# Patient Record
Sex: Female | Born: 1955 | Race: Black or African American | Hispanic: No | Marital: Single | Smoking: Never smoker
Health system: Southern US, Community
[De-identification: ages and names within clinical notes are randomized; demographics above are authoritative.]

## PROBLEM LIST (undated history)

## (undated) DIAGNOSIS — Z86718 Personal history of other venous thrombosis and embolism: Secondary | ICD-10-CM

## (undated) DIAGNOSIS — J40 Bronchitis, not specified as acute or chronic: Secondary | ICD-10-CM

## (undated) DIAGNOSIS — Q2730 Arteriovenous malformation, site unspecified: Secondary | ICD-10-CM

## (undated) DIAGNOSIS — I739 Peripheral vascular disease, unspecified: Secondary | ICD-10-CM

## (undated) DIAGNOSIS — J9610 Chronic respiratory failure, unspecified whether with hypoxia or hypercapnia: Secondary | ICD-10-CM

## (undated) DIAGNOSIS — I6529 Occlusion and stenosis of unspecified carotid artery: Secondary | ICD-10-CM

## (undated) DIAGNOSIS — N19 Unspecified kidney failure: Secondary | ICD-10-CM

## (undated) DIAGNOSIS — I779 Disorder of arteries and arterioles, unspecified: Secondary | ICD-10-CM

## (undated) DIAGNOSIS — E039 Hypothyroidism, unspecified: Secondary | ICD-10-CM

## (undated) DIAGNOSIS — J449 Chronic obstructive pulmonary disease, unspecified: Secondary | ICD-10-CM

## (undated) DIAGNOSIS — E875 Hyperkalemia: Secondary | ICD-10-CM

## (undated) DIAGNOSIS — I998 Other disorder of circulatory system: Secondary | ICD-10-CM

## (undated) DIAGNOSIS — E079 Disorder of thyroid, unspecified: Secondary | ICD-10-CM

## (undated) DIAGNOSIS — D649 Anemia, unspecified: Secondary | ICD-10-CM

## (undated) DIAGNOSIS — I639 Cerebral infarction, unspecified: Secondary | ICD-10-CM

## (undated) DIAGNOSIS — I10 Essential (primary) hypertension: Secondary | ICD-10-CM

## (undated) HISTORY — PX: FEMORAL ENDARTERECTOMY: SUR606

## (undated) HISTORY — PX: LEG SURGERY: SHX1003

## (undated) HISTORY — DX: Disorder of arteries and arterioles, unspecified: I77.9

## (undated) HISTORY — DX: Hypothyroidism, unspecified: E03.9

## (undated) HISTORY — DX: Peripheral vascular disease, unspecified: I73.9

## (undated) HISTORY — DX: Other disorder of circulatory system: I99.8

## (undated) HISTORY — PX: BREAST CYST EXCISION: SHX579

## (undated) HISTORY — DX: Essential (primary) hypertension: I10

---

## 2005-06-10 ENCOUNTER — Other Ambulatory Visit: Payer: Self-pay

## 2005-06-10 ENCOUNTER — Emergency Department: Payer: Self-pay | Admitting: Emergency Medicine

## 2008-06-12 ENCOUNTER — Emergency Department: Payer: Self-pay | Admitting: Unknown Physician Specialty

## 2009-02-03 ENCOUNTER — Emergency Department: Payer: Self-pay | Admitting: Emergency Medicine

## 2010-08-16 ENCOUNTER — Emergency Department: Payer: Self-pay | Admitting: Emergency Medicine

## 2010-08-27 ENCOUNTER — Emergency Department: Payer: Self-pay | Admitting: Emergency Medicine

## 2012-09-15 ENCOUNTER — Inpatient Hospital Stay: Payer: Self-pay | Admitting: Specialist

## 2012-09-15 LAB — CK TOTAL AND CKMB (NOT AT ARMC)
CK, Total: 45 U/L (ref 21–215)
CK-MB: <0.5 ng/mL — ABNORMAL LOW (ref 0.5–3.6)

## 2012-09-15 LAB — COMPREHENSIVE METABOLIC PANEL
Anion Gap: 4 — ABNORMAL LOW (ref 7–16)
Calcium, Total: 9.1 mg/dL (ref 8.5–10.1)
Co2: 27 mmol/L (ref 21–32)
Creatinine: 1.05 mg/dL (ref 0.60–1.30)
EGFR (African American): >60
EGFR (Non-African Amer.): 59 — ABNORMAL LOW
SGOT(AST): 16 U/L (ref 15–37)
Sodium: 138 mmol/L (ref 136–145)
Total Protein: 7.9 g/dL (ref 6.4–8.2)

## 2012-09-15 LAB — CBC
HCT: 34.4 % — ABNORMAL LOW (ref 35.0–47.0)
HGB: 11 g/dL — ABNORMAL LOW (ref 12.0–16.0)
MCH: 23.5 pg — ABNORMAL LOW (ref 26.0–34.0)
MCHC: 32.1 g/dL (ref 32.0–36.0)
MCV: 73 fL — ABNORMAL LOW (ref 80–100)
Platelet: 345 10*3/uL (ref 150–440)
RBC: 4.7 10*6/uL (ref 3.80–5.20)
RDW: 19.4 % — ABNORMAL HIGH (ref 11.5–14.5)

## 2012-09-16 LAB — CBC WITH DIFFERENTIAL/PLATELET
Basophil %: 1.2 %
Eosinophil #: 0.1 10*3/uL (ref 0.0–0.7)
HGB: 10.3 g/dL — ABNORMAL LOW (ref 12.0–16.0)
Lymphocyte %: 31.7 %
MCV: 72 fL — ABNORMAL LOW (ref 80–100)
Monocyte #: 0.6 x10 3/mm (ref 0.2–0.9)
Neutrophil %: 54.8 %
WBC: 5.7 10*3/uL (ref 3.6–11.0)

## 2012-09-16 LAB — BASIC METABOLIC PANEL
Anion Gap: 4 — ABNORMAL LOW (ref 7–16)
Calcium, Total: 9.4 mg/dL (ref 8.5–10.1)
Co2: 28 mmol/L (ref 21–32)
Creatinine: 1.35 mg/dL — ABNORMAL HIGH (ref 0.60–1.30)
EGFR (Non-African Amer.): 44 — ABNORMAL LOW
Glucose: 91 mg/dL (ref 65–99)
Potassium: 4.5 mmol/L (ref 3.5–5.1)

## 2012-09-16 LAB — LIPID PANEL
HDL Cholesterol: 56 mg/dL (ref 40–60)
Ldl Cholesterol, Calc: 138 mg/dL — ABNORMAL HIGH (ref 0–100)
Triglycerides: 105 mg/dL (ref 0–200)
VLDL Cholesterol, Calc: 21 mg/dL (ref 5–40)

## 2012-09-16 LAB — TSH: Thyroid Stimulating Horm: 1.71 u[IU]/mL

## 2012-09-17 ENCOUNTER — Ambulatory Visit: Payer: Self-pay | Admitting: Neurology

## 2012-09-17 LAB — URINALYSIS, COMPLETE
Hyaline Cast: 2
Ph: 6 (ref 4.5–8.0)
Protein: NEGATIVE
RBC,UR: 20 /HPF (ref 0–5)
Specific Gravity: 1.012 (ref 1.003–1.030)
WBC UR: 248 /HPF (ref 0–5)

## 2012-09-17 LAB — BASIC METABOLIC PANEL
Anion Gap: 8 (ref 7–16)
Chloride: 108 mmol/L — ABNORMAL HIGH (ref 98–107)
Co2: 24 mmol/L (ref 21–32)
Creatinine: 1.93 mg/dL — ABNORMAL HIGH (ref 0.60–1.30)
EGFR (African American): 33 — ABNORMAL LOW
Glucose: 96 mg/dL (ref 65–99)
Osmolality: 282 (ref 275–301)
Potassium: 4.2 mmol/L (ref 3.5–5.1)

## 2012-09-18 LAB — BASIC METABOLIC PANEL
Anion Gap: 5 — ABNORMAL LOW (ref 7–16)
BUN: 14 mg/dL (ref 7–18)
Calcium, Total: 8.9 mg/dL (ref 8.5–10.1)
Chloride: 111 mmol/L — ABNORMAL HIGH (ref 98–107)
Creatinine: 1.18 mg/dL (ref 0.60–1.30)
EGFR (Non-African Amer.): 52 — ABNORMAL LOW
Osmolality: 281 (ref 275–301)
Potassium: 4.1 mmol/L (ref 3.5–5.1)

## 2012-09-19 LAB — URINE CULTURE

## 2013-05-30 ENCOUNTER — Ambulatory Visit: Payer: Self-pay | Admitting: Otolaryngology

## 2013-08-02 DIAGNOSIS — N189 Chronic kidney disease, unspecified: Secondary | ICD-10-CM | POA: Insufficient documentation

## 2013-08-10 ENCOUNTER — Emergency Department: Payer: Self-pay | Admitting: Emergency Medicine

## 2013-08-10 LAB — URINALYSIS, COMPLETE
BILIRUBIN, UR: NEGATIVE
BLOOD: NEGATIVE
Glucose,UR: NEGATIVE mg/dL (ref 0–75)
Ketone: NEGATIVE
Nitrite: NEGATIVE
PROTEIN: NEGATIVE
Ph: 5 (ref 4.5–8.0)
RBC,UR: <1 /HPF (ref 0–5)
SPECIFIC GRAVITY: 1.005 (ref 1.003–1.030)
Squamous Epithelial: <1
WBC UR: 2 /HPF (ref 0–5)

## 2013-08-10 LAB — COMPREHENSIVE METABOLIC PANEL
ALK PHOS: 110 U/L
Albumin: 3.6 g/dL (ref 3.4–5.0)
Anion Gap: 2 — ABNORMAL LOW (ref 7–16)
BUN: 39 mg/dL — AB (ref 7–18)
Bilirubin,Total: 0.2 mg/dL (ref 0.2–1.0)
CALCIUM: 8.9 mg/dL (ref 8.5–10.1)
CHLORIDE: 110 mmol/L — AB (ref 98–107)
CREATININE: 1.76 mg/dL — AB (ref 0.60–1.30)
Co2: 26 mmol/L (ref 21–32)
EGFR (Non-African Amer.): 32 — ABNORMAL LOW
GFR CALC AF AMER: 37 — AB
Glucose: 104 mg/dL — ABNORMAL HIGH (ref 65–99)
Osmolality: 285 (ref 275–301)
POTASSIUM: 4.5 mmol/L (ref 3.5–5.1)
SGOT(AST): 16 U/L (ref 15–37)
SGPT (ALT): 17 U/L (ref 12–78)
Sodium: 138 mmol/L (ref 136–145)
Total Protein: 7.9 g/dL (ref 6.4–8.2)

## 2013-08-10 LAB — CBC
HCT: 37.2 % (ref 35.0–47.0)
HGB: 12 g/dL (ref 12.0–16.0)
MCH: 29.5 pg (ref 26.0–34.0)
MCHC: 32.1 g/dL (ref 32.0–36.0)
MCV: 92 fL (ref 80–100)
PLATELETS: 329 10*3/uL (ref 150–440)
RBC: 4.05 10*6/uL (ref 3.80–5.20)
RDW: 15.3 % — ABNORMAL HIGH (ref 11.5–14.5)
WBC: 4.9 10*3/uL (ref 3.6–11.0)

## 2013-11-18 ENCOUNTER — Emergency Department: Payer: Self-pay | Admitting: Emergency Medicine

## 2013-11-18 LAB — CBC WITH DIFFERENTIAL/PLATELET
BASOS PCT: 1.1 %
Basophil #: 0.1 10*3/uL (ref 0.0–0.1)
EOS PCT: 1.6 %
Eosinophil #: 0.1 10*3/uL (ref 0.0–0.7)
HCT: 36.1 % (ref 35.0–47.0)
HGB: 11.3 g/dL — AB (ref 12.0–16.0)
Lymphocyte #: 2 10*3/uL (ref 1.0–3.6)
Lymphocyte %: 37.9 %
MCH: 28.3 pg (ref 26.0–34.0)
MCHC: 31.3 g/dL — ABNORMAL LOW (ref 32.0–36.0)
MCV: 90 fL (ref 80–100)
MONO ABS: 0.6 x10 3/mm (ref 0.2–0.9)
Monocyte %: 10.9 %
NEUTROS PCT: 48.5 %
Neutrophil #: 2.5 10*3/uL (ref 1.4–6.5)
Platelet: 333 10*3/uL (ref 150–440)
RBC: 3.99 10*6/uL (ref 3.80–5.20)
RDW: 14.9 % — ABNORMAL HIGH (ref 11.5–14.5)
WBC: 5.2 10*3/uL (ref 3.6–11.0)

## 2013-11-18 LAB — BASIC METABOLIC PANEL
Anion Gap: 6 — ABNORMAL LOW (ref 7–16)
BUN: 25 mg/dL — ABNORMAL HIGH (ref 7–18)
CHLORIDE: 109 mmol/L — AB (ref 98–107)
Calcium, Total: 8.9 mg/dL (ref 8.5–10.1)
Co2: 25 mmol/L (ref 21–32)
Creatinine: 1.57 mg/dL — ABNORMAL HIGH (ref 0.60–1.30)
EGFR (African American): 42 — ABNORMAL LOW
GFR CALC NON AF AMER: 36 — AB
GLUCOSE: 100 mg/dL — AB (ref 65–99)
Osmolality: 284 (ref 275–301)
POTASSIUM: 4.7 mmol/L (ref 3.5–5.1)
Sodium: 140 mmol/L (ref 136–145)

## 2013-11-18 LAB — URIC ACID: URIC ACID: 5.9 mg/dL (ref 2.6–6.0)

## 2013-11-23 DIAGNOSIS — K219 Gastro-esophageal reflux disease without esophagitis: Secondary | ICD-10-CM | POA: Insufficient documentation

## 2013-11-23 DIAGNOSIS — Z8673 Personal history of transient ischemic attack (TIA), and cerebral infarction without residual deficits: Secondary | ICD-10-CM | POA: Insufficient documentation

## 2014-01-11 DIAGNOSIS — I779 Disorder of arteries and arterioles, unspecified: Secondary | ICD-10-CM | POA: Insufficient documentation

## 2014-02-03 DIAGNOSIS — E875 Hyperkalemia: Secondary | ICD-10-CM | POA: Insufficient documentation

## 2014-02-25 HISTORY — PX: OTHER SURGICAL HISTORY: SHX169

## 2014-03-15 ENCOUNTER — Inpatient Hospital Stay: Payer: Self-pay | Admitting: Internal Medicine

## 2014-03-15 LAB — DRUG SCREEN, URINE
Amphetamines, Ur Screen: NEGATIVE (ref ?–1000)
Barbiturates, Ur Screen: NEGATIVE (ref ?–200)
Benzodiazepine, Ur Scrn: NEGATIVE (ref ?–200)
Cannabinoid 50 Ng, Ur ~~LOC~~: NEGATIVE (ref ?–50)
Cocaine Metabolite,Ur ~~LOC~~: NEGATIVE (ref ?–300)
MDMA (ECSTASY) UR SCREEN: NEGATIVE (ref ?–500)
Methadone, Ur Screen: NEGATIVE (ref ?–300)
Opiate, Ur Screen: NEGATIVE (ref ?–300)
PHENCYCLIDINE (PCP) UR S: NEGATIVE (ref ?–25)
TRICYCLIC, UR SCREEN: NEGATIVE (ref ?–1000)

## 2014-03-15 LAB — TROPONIN I
Troponin-I: <0.02 ng/mL
Troponin-I: <0.02 ng/mL
Troponin-I: <0.02 ng/mL

## 2014-03-15 LAB — URINALYSIS, COMPLETE
Bilirubin,UR: NEGATIVE
Blood: NEGATIVE
GLUCOSE, UR: NEGATIVE mg/dL (ref 0–75)
Ketone: NEGATIVE
Nitrite: NEGATIVE
PROTEIN: NEGATIVE
Ph: 5 (ref 4.5–8.0)
Specific Gravity: 1.01 (ref 1.003–1.030)

## 2014-03-15 LAB — CBC
HCT: 18.2 % — AB (ref 35.0–47.0)
HGB: 5.9 g/dL — ABNORMAL LOW (ref 12.0–16.0)
MCH: 28.8 pg (ref 26.0–34.0)
MCHC: 32.3 g/dL (ref 32.0–36.0)
MCV: 89 fL (ref 80–100)
Platelet: 458 10*3/uL — ABNORMAL HIGH (ref 150–440)
RBC: 2.04 10*6/uL — AB (ref 3.80–5.20)
RDW: 17 % — AB (ref 11.5–14.5)
WBC: 6.7 10*3/uL (ref 3.6–11.0)

## 2014-03-15 LAB — COMPREHENSIVE METABOLIC PANEL
ALBUMIN: 3 g/dL — AB (ref 3.4–5.0)
ANION GAP: 12 (ref 7–16)
AST: 12 U/L — AB (ref 15–37)
Alkaline Phosphatase: 82 U/L
BUN: 85 mg/dL — ABNORMAL HIGH (ref 7–18)
Bilirubin,Total: 0.2 mg/dL (ref 0.2–1.0)
CALCIUM: 9.2 mg/dL (ref 8.5–10.1)
CO2: 23 mmol/L (ref 21–32)
Chloride: 92 mmol/L — ABNORMAL LOW (ref 98–107)
Creatinine: 7.26 mg/dL — ABNORMAL HIGH (ref 0.60–1.30)
EGFR (African American): 7 — ABNORMAL LOW
GFR CALC NON AF AMER: 6 — AB
GLUCOSE: 99 mg/dL (ref 65–99)
Osmolality: 281 (ref 275–301)
POTASSIUM: 5.6 mmol/L — AB (ref 3.5–5.1)
SGPT (ALT): 10 U/L — ABNORMAL LOW
SODIUM: 127 mmol/L — AB (ref 136–145)
TOTAL PROTEIN: 7.5 g/dL (ref 6.4–8.2)

## 2014-03-15 LAB — BASIC METABOLIC PANEL
Anion Gap: 12 (ref 7–16)
BUN: 78 mg/dL — AB (ref 7–18)
CO2: 20 mmol/L — AB (ref 21–32)
Calcium, Total: 8.5 mg/dL (ref 8.5–10.1)
Chloride: 100 mmol/L (ref 98–107)
Creatinine: 5.35 mg/dL — ABNORMAL HIGH (ref 0.60–1.30)
EGFR (African American): 11 — ABNORMAL LOW
EGFR (Non-African Amer.): 9 — ABNORMAL LOW
GLUCOSE: 112 mg/dL — AB (ref 65–99)
OSMOLALITY: 289 (ref 275–301)
Potassium: 5.5 mmol/L — ABNORMAL HIGH (ref 3.5–5.1)
SODIUM: 132 mmol/L — AB (ref 136–145)

## 2014-03-15 LAB — CK TOTAL AND CKMB (NOT AT ARMC)
CK, Total: 109 U/L
CK, Total: 110 U/L
CK-MB: 1.4 ng/mL (ref 0.5–3.6)
CK-MB: 1.3 ng/mL (ref 0.5–3.6)

## 2014-03-15 LAB — IRON AND TIBC
IRON: 23 ug/dL — AB (ref 50–170)
Iron Bind.Cap.(Total): 263 ug/dL (ref 250–450)
Iron Saturation: 9 %
Unbound Iron-Bind.Cap.: 240 ug/dL

## 2014-03-15 LAB — FERRITIN: Ferritin (ARMC): 98 ng/mL (ref 8–388)

## 2014-03-16 LAB — CK TOTAL AND CKMB (NOT AT ARMC)
CK, Total: 106 U/L
CK-MB: 1.3 ng/mL (ref 0.5–3.6)

## 2014-03-16 LAB — FERRITIN: Ferritin (ARMC): 77 ng/mL (ref 8–388)

## 2014-03-16 LAB — CBC WITH DIFFERENTIAL/PLATELET
BASOS ABS: 0.1 10*3/uL (ref 0.0–0.1)
BASOS PCT: 0.8 %
EOS PCT: 0.7 %
Eosinophil #: 0 10*3/uL (ref 0.0–0.7)
HCT: 20.6 % — ABNORMAL LOW (ref 35.0–47.0)
HGB: 6.8 g/dL — AB (ref 12.0–16.0)
Lymphocyte #: 1.3 10*3/uL (ref 1.0–3.6)
Lymphocyte %: 19.2 %
MCH: 29 pg (ref 26.0–34.0)
MCHC: 32.9 g/dL (ref 32.0–36.0)
MCV: 88 fL (ref 80–100)
MONO ABS: 0.9 x10 3/mm (ref 0.2–0.9)
Monocyte %: 14.6 %
NEUTROS ABS: 4.2 10*3/uL (ref 1.4–6.5)
Neutrophil %: 64.7 %
Platelet: 393 10*3/uL (ref 150–440)
RBC: 2.34 10*6/uL — ABNORMAL LOW (ref 3.80–5.20)
RDW: 16 % — ABNORMAL HIGH (ref 11.5–14.5)
WBC: 6.5 10*3/uL (ref 3.6–11.0)

## 2014-03-16 LAB — BASIC METABOLIC PANEL
Anion Gap: 10 (ref 7–16)
BUN: 75 mg/dL — ABNORMAL HIGH (ref 7–18)
CHLORIDE: 103 mmol/L (ref 98–107)
CO2: 22 mmol/L (ref 21–32)
Calcium, Total: 8.5 mg/dL (ref 8.5–10.1)
Creatinine: 3.93 mg/dL — ABNORMAL HIGH (ref 0.60–1.30)
EGFR (Non-African Amer.): 13 — ABNORMAL LOW
GFR CALC AF AMER: 15 — AB
GLUCOSE: 86 mg/dL (ref 65–99)
Osmolality: 292 (ref 275–301)
Potassium: 5 mmol/L (ref 3.5–5.1)
Sodium: 135 mmol/L — ABNORMAL LOW (ref 136–145)

## 2014-03-16 LAB — TROPONIN I: Troponin-I: 0.02 ng/mL

## 2014-03-16 LAB — IRON AND TIBC
IRON: 24 ug/dL — AB (ref 50–170)
Iron Bind.Cap.(Total): 227 ug/dL — ABNORMAL LOW (ref 250–450)
Iron Saturation: 11 %
UNBOUND IRON-BIND. CAP.: 203 ug/dL

## 2014-03-17 LAB — BASIC METABOLIC PANEL WITH GFR
Anion Gap: 7
BUN: 41 mg/dL — ABNORMAL HIGH
Calcium, Total: 7.9 mg/dL — ABNORMAL LOW
Chloride: 115 mmol/L — ABNORMAL HIGH
Co2: 22 mmol/L
Creatinine: 1.64 mg/dL — ABNORMAL HIGH
EGFR (African American): 42 — ABNORMAL LOW
EGFR (Non-African Amer.): 34 — ABNORMAL LOW
Glucose: 102 mg/dL — ABNORMAL HIGH
Osmolality: 297
Potassium: 5 mmol/L
Sodium: 144 mmol/L

## 2014-03-17 LAB — CBC WITH DIFFERENTIAL/PLATELET
Basophil #: 0.1 x10 3/mm 3
Basophil %: 1.2 %
Eosinophil #: 0 x10 3/mm 3
Eosinophil %: 0.5 %
HCT: 19.3 % — ABNORMAL LOW
HGB: 6.3 g/dL — ABNORMAL LOW
Lymphocyte %: 18.3 %
Lymphs Abs: 1.2 x10 3/mm 3
MCH: 29 pg
MCHC: 32.6 g/dL
MCV: 89 fL
Monocyte #: 0.9 "x10 3/mm "
Monocyte %: 13.7 %
Neutrophil #: 4.4 x10 3/mm 3
Neutrophil %: 66.3 %
Platelet: 377 x10 3/mm 3
RBC: 2.17 X10 6/mm 3 — ABNORMAL LOW
RDW: 16.2 % — ABNORMAL HIGH
WBC: 6.7 x10 3/mm 3

## 2014-03-17 LAB — PROTEIN / CREATININE RATIO, URINE
Creatinine, Urine: 50 mg/dL
Protein, Random Urine: 9 mg/dL
Protein/Creat. Ratio: 180 mg/g{creat}

## 2014-03-18 LAB — CBC WITH DIFFERENTIAL/PLATELET
Basophil #: 0.1 10*3/uL (ref 0.0–0.1)
Basophil %: 0.8 %
EOS ABS: 0.1 10*3/uL (ref 0.0–0.7)
EOS PCT: 1 %
HCT: 24.1 % — AB (ref 35.0–47.0)
HGB: 7.8 g/dL — AB (ref 12.0–16.0)
LYMPHS ABS: 1.7 10*3/uL (ref 1.0–3.6)
Lymphocyte %: 24.3 %
MCH: 28.6 pg (ref 26.0–34.0)
MCHC: 32.3 g/dL (ref 32.0–36.0)
MCV: 89 fL (ref 80–100)
MONO ABS: 1 x10 3/mm — AB (ref 0.2–0.9)
Monocyte %: 14.2 %
NEUTROS PCT: 59.7 %
Neutrophil #: 4.2 10*3/uL (ref 1.4–6.5)
Platelet: 385 10*3/uL (ref 150–440)
RBC: 2.72 10*6/uL — ABNORMAL LOW (ref 3.80–5.20)
RDW: 17 % — AB (ref 11.5–14.5)
WBC: 7.1 10*3/uL (ref 3.6–11.0)

## 2014-03-18 LAB — BASIC METABOLIC PANEL
Anion Gap: 9 (ref 7–16)
BUN: 29 mg/dL — AB (ref 7–18)
CALCIUM: 8.5 mg/dL (ref 8.5–10.1)
CO2: 21 mmol/L (ref 21–32)
Chloride: 115 mmol/L — ABNORMAL HIGH (ref 98–107)
Creatinine: 1.39 mg/dL — ABNORMAL HIGH (ref 0.60–1.30)
EGFR (Non-African Amer.): 42 — ABNORMAL LOW
GFR CALC AF AMER: 50 — AB
GLUCOSE: 96 mg/dL (ref 65–99)
Osmolality: 294 (ref 275–301)
Potassium: 4.8 mmol/L (ref 3.5–5.1)
SODIUM: 145 mmol/L (ref 136–145)

## 2014-03-19 LAB — CBC WITH DIFFERENTIAL/PLATELET
Basophil #: 0.1 10*3/uL (ref 0.0–0.1)
Basophil %: 1 %
EOS PCT: 1.5 %
Eosinophil #: 0.1 10*3/uL (ref 0.0–0.7)
HCT: 23.1 % — AB (ref 35.0–47.0)
HGB: 7.4 g/dL — ABNORMAL LOW (ref 12.0–16.0)
LYMPHS PCT: 21.6 %
Lymphocyte #: 1.4 10*3/uL (ref 1.0–3.6)
MCH: 28.8 pg (ref 26.0–34.0)
MCHC: 32.2 g/dL (ref 32.0–36.0)
MCV: 90 fL (ref 80–100)
Monocyte #: 0.8 x10 3/mm (ref 0.2–0.9)
Monocyte %: 11.7 %
NEUTROS ABS: 4.3 10*3/uL (ref 1.4–6.5)
Neutrophil %: 64.2 %
Platelet: 386 10*3/uL (ref 150–440)
RBC: 2.58 10*6/uL — AB (ref 3.80–5.20)
RDW: 16.8 % — ABNORMAL HIGH (ref 11.5–14.5)
WBC: 6.7 10*3/uL (ref 3.6–11.0)

## 2014-03-19 LAB — BASIC METABOLIC PANEL
Anion Gap: 9 (ref 7–16)
BUN: 22 mg/dL — AB (ref 7–18)
CO2: 20 mmol/L — AB (ref 21–32)
Calcium, Total: 8.3 mg/dL — ABNORMAL LOW (ref 8.5–10.1)
Chloride: 116 mmol/L — ABNORMAL HIGH (ref 98–107)
Creatinine: 1.3 mg/dL (ref 0.60–1.30)
EGFR (African American): 54 — ABNORMAL LOW
EGFR (Non-African Amer.): 45 — ABNORMAL LOW
Glucose: 95 mg/dL (ref 65–99)
Osmolality: 292 (ref 275–301)
Potassium: 4.3 mmol/L (ref 3.5–5.1)
Sodium: 145 mmol/L (ref 136–145)

## 2014-03-19 LAB — UR PROT ELECTROPHORESIS, URINE RANDOM

## 2014-03-19 LAB — PROTEIN ELECTROPHORESIS(ARMC)

## 2014-03-20 LAB — BASIC METABOLIC PANEL
Anion Gap: 9 (ref 7–16)
BUN: 15 mg/dL (ref 7–18)
CHLORIDE: 111 mmol/L — AB (ref 98–107)
Calcium, Total: 8.3 mg/dL — ABNORMAL LOW (ref 8.5–10.1)
Co2: 22 mmol/L (ref 21–32)
Creatinine: 1.26 mg/dL (ref 0.60–1.30)
EGFR (Non-African Amer.): 47 — ABNORMAL LOW
GFR CALC AF AMER: 56 — AB
Glucose: 96 mg/dL (ref 65–99)
Osmolality: 284 (ref 275–301)
Potassium: 4.1 mmol/L (ref 3.5–5.1)
Sodium: 142 mmol/L (ref 136–145)

## 2014-03-20 LAB — HEMOGLOBIN: HGB: 7.4 g/dL — AB (ref 12.0–16.0)

## 2014-03-21 LAB — HEMOGLOBIN: HGB: 7.2 g/dL — ABNORMAL LOW (ref 12.0–16.0)

## 2014-03-22 LAB — HEMOGLOBIN: HGB: 6.9 g/dL — ABNORMAL LOW (ref 12.0–16.0)

## 2014-03-22 LAB — OCCULT BLOOD X 1 CARD TO LAB, STOOL: Occult Blood, Feces: POSITIVE

## 2014-03-23 LAB — HEMOGLOBIN: HGB: 7.8 g/dL — ABNORMAL LOW (ref 12.0–16.0)

## 2014-04-27 ENCOUNTER — Emergency Department: Payer: Self-pay | Admitting: Emergency Medicine

## 2014-04-27 LAB — CBC
HCT: 46.7 % (ref 35.0–47.0)
HGB: 14.7 g/dL (ref 12.0–16.0)
MCH: 27.5 pg (ref 26.0–34.0)
MCHC: 31.5 g/dL — ABNORMAL LOW (ref 32.0–36.0)
MCV: 88 fL (ref 80–100)
PLATELETS: 419 10*3/uL (ref 150–440)
RBC: 5.34 10*6/uL — ABNORMAL HIGH (ref 3.80–5.20)
RDW: 18.1 % — ABNORMAL HIGH (ref 11.5–14.5)
WBC: 10.3 10*3/uL (ref 3.6–11.0)

## 2014-04-27 LAB — COMPREHENSIVE METABOLIC PANEL
ALK PHOS: 93 U/L
ANION GAP: 10 (ref 7–16)
Albumin: 3.7 g/dL (ref 3.4–5.0)
BILIRUBIN TOTAL: 0.6 mg/dL (ref 0.2–1.0)
BUN: 7 mg/dL (ref 7–18)
CREATININE: 1 mg/dL (ref 0.60–1.30)
Calcium, Total: 8.8 mg/dL (ref 8.5–10.1)
Chloride: 104 mmol/L (ref 98–107)
Co2: 26 mmol/L (ref 21–32)
EGFR (African American): >60
EGFR (Non-African Amer.): >60
Glucose: 111 mg/dL — ABNORMAL HIGH (ref 65–99)
OSMOLALITY: 278 (ref 275–301)
POTASSIUM: 3 mmol/L — AB (ref 3.5–5.1)
SGOT(AST): 42 U/L — ABNORMAL HIGH (ref 15–37)
SGPT (ALT): 50 U/L
Sodium: 140 mmol/L (ref 136–145)
Total Protein: 8.4 g/dL — ABNORMAL HIGH (ref 6.4–8.2)

## 2014-04-27 LAB — LIPASE, BLOOD: LIPASE: 158 U/L (ref 73–393)

## 2014-05-29 ENCOUNTER — Ambulatory Visit: Payer: Self-pay | Admitting: Otolaryngology

## 2014-07-10 ENCOUNTER — Ambulatory Visit: Payer: Self-pay | Admitting: Gastroenterology

## 2014-07-18 DIAGNOSIS — I70229 Atherosclerosis of native arteries of extremities with rest pain, unspecified extremity: Secondary | ICD-10-CM | POA: Insufficient documentation

## 2014-07-18 DIAGNOSIS — I998 Other disorder of circulatory system: Secondary | ICD-10-CM | POA: Insufficient documentation

## 2014-07-18 HISTORY — DX: Atherosclerosis of native arteries of extremities with rest pain, unspecified extremity: I70.229

## 2014-08-17 NOTE — H&P (Signed)
PATIENT NAME:  Kimberly Mccarthy, Kimberly Mccarthy MR#:  [id number] DATE OF BIRTH:  [birth year]  DATE OF ADMISSION:  09/15/2012  PRIMARY CARE PHYSICIAN: None.   CHIEF COMPLAINT: Slurred speech and facial droop.  HISTORY OF PRESENT ILLNESS: The patient is a 59 year old female who presents with the above complaint. Apparently the patient was sitting with her sister this afternoon when her sister noted that she had slurred speech. She was unable to comprehend what the patient was saying. She also noticed facial droop. This was sometime this afternoon.  The patient finally agreed to coming to the hospital. She was brought into the ER where her blood pressure apparently when EMS picked her up was over 280. Her blood pressure here is recorded as 213/99. She continues to have some slurred speech and facial droop.   REVIEW OF SYSTEMS:  CONSTITUTIONAL: No fever. Positive fatigue. No weakness, weight loss or weight gain.  EYES: No blurred vision. No glaucoma or cataract.  ENT: No ear pain, hearing loss, seasonal allergies, postnasal drip, snoring.  RESPIRATORY: No cough, wheezing, hemoptysis, COPD.  CARDIOVASCULAR: No chest pain, orthopnea, palpitations, syncope, edema, arrhythmia, dyspnea on exertion.  GASTROINTESTINAL:  No nausea, vomiting, diarrhea, abdominal pain, melena or ulcers.  GENITOURINARY: No dysuria or hematuria.  ENDOCRINE: No polyuria or polydipsia. HEMATOLOGIC/LYMPHATIC: No anemia, easy bruising or bleeding.  SKIN: No rash or lesions.  MUSCULOSKELETAL: No pain in shoulders, knees. No limited activity.  NEUROLOGICAL: No history of CVA, TIA, or seizures.  PSYCHIATRIC:  No history of anxiety or depression.   PAST MEDICAL HISTORY:  Hypertension.   MEDICATIONS: None.   ALLERGIES: No known drug allergies.  PAST SURGICAL HISTORY: None.   SOCIAL HISTORY: The patient smokes about a pack a day, very occasional alcohol use.   FAMILY HISTORY: Positive for hypertension, diabetes and CAD.   PHYSICAL  EXAMINATION:   VITAL SIGNS: Temperature 98.7, pulse 75, respirations 18, blood pressure 213/99, 99% on room air.  GENERAL: The patient is alert, oriented x 3, does not appear to be in any acute distress.  HEENT: Head is atraumatic. Pupils are round and reactive. Sclerae are anicteric. Mucous membranes are moist. Oropharynx is clear.  NECK: Supple without JVD, carotid bruit or enlarged thyroid.  CARDIOVASCULAR: Regular rate and rhythm. There is a 3/6 systolic ejection murmur heard best at the right sternal border. PMI is laterally displaced.  LUNGS: Clear to auscultation without crackles, rales, rhonchi or wheezing. Normal percussion.  ABDOMEN: Obese.  Bowel sounds are positive. Nontender and nondistended. No hepatosplenomegaly.  EXTREMITIES:  No cyanosis, clubbing or edema.   NEUROLOGICAL: The patient has a left facial droop. She has slurred speech.  All other cranial nerves are intact. Left grip strength is 4 out of 5, left lower extremity is 5 out of 5 strength.  SKIN: Without rash or lesions.   LABORATORY AND RADIOLOGICAL DATA:  White blood cells 7.1, hemoglobin 11, hematocrit 34.4, platelets are 345.  Sodium138, potassium 4.3, chloride 107, bicarbonate 27, BUN 11, creatinine 1.05, glucose 91, calcium 9.1, bilirubin 0.3, alk phos 86, ALT 12, AST 16, total protein 7.9, albumin 3.7. Troponin less than 0.02. CK 45. CPK-MB less than 0.5.  CT of the head shows no acute intracranial hemorrhage or CVA.  EKG shows normal sinus rhythm. She has LVH with some mild LV strain.   ASSESSMENT AND PLAN: The patient is a 6- year-old female who presents with probable cerebrovascular accident with slurred speech and left facial droop as well as hypertensive emergency.   1.  Cerebrovascular accident:  The patient still has slurred speech and left facial droop.  I have ordered an MRI, carotid Dopplers, echocardiogram, speech consultation, and physical therapy consultation. She will be placed on aspirin and statin,  and we can check her lipids in the a.m.  2.  Hypertensive emergency: Probably from her stroke or vice versa, hypertension uncontrolled causing a stroke. We will allow permissive hypertension. I have started low dose of beta blocker, hydralazine p.r.n. and allowed blood pressure to be less than 409 systolic, diastolic less than 811.  3.  Tobacco dependence:  The patient does not want a nicotine patch. She is not interested in quitting, however, either.  The patient was counseled for 3 minutes.  4.  The patient will need a primary care physician prior to discharge   TIME SPENT:  Approximately 45 minutes.  ____________________________ Donell Beers. Benjie Karvonen, MD spm:cb D: 09/15/2012 21:38:14 ET T: 09/15/2012 21:54:45 ET JOB#: [id number]  cc: Keondre Markson P. Benjie Karvonen, MD, <Dictator> Rakesh Dutko P Bosten Newstrom MD ELECTRONICALLY SIGNED 09/19/2012 21:07

## 2014-08-17 NOTE — Discharge Summary (Signed)
PATIENT NAME:  Kimberly Mccarthy, Kimberly Mccarthy MR#:  [id number] DATE OF BIRTH:  23-May-1955  DATE OF ADMISSION:  09/15/2012 DATE OF DISCHARGE:  09/18/2012  For a detailed note, please see the history and physical done on admission by Dr. Adrian Saran.    DIAGNOSES AT DISCHARGE:  1.  Acute pontine cerebrovascular accident.  2.  Slurred speech and left upper extremity weakness secondary to acute cerebrovascular accident.  3.  Malignant hypertension.  4.  Acute renal failure.  5.  Tobacco abuse.  6.  Urinary tract infection.   DIET:  The patient is being discharged on a low sodium, low fat diet.   ACTIVITY: As tolerated.   FOLLOW-UP: With the [facility] in the next 2 to 3 weeks.   DISCHARGE MEDICATIONS:  Are as follows:  Clonidine 0.1 mg b.i.d., aspirin 81 mg daily, hydralazine 25 mg t.i.d., simvastatin 10 mg at bedtime and ciprofloxacin and 50 mg b.i.d. x 5 days.   CONSULTANTS DURING THE HOSPITAL COURSE: Dr. Loretha Brasil from neurology.   PERTINENT STUDIES DONE DURING THE HOSPITAL COURSE: Is as follows:  CT scan of the head done without contrast on admission showing no acute intracranial abnormality. An MRI of the brain done without contrast showing an acute right pons infarct, white matter changes consistent with chronic white matter ischemia.   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   HOSPITAL COURSE: This is a 59 year old female with medical problems as mentioned above, who presented to the hospital on 09/15/2012 secondary to malignant hypertension,  left upper extremity weakness and slurred speech and left-sided facial droop.  1.  Acute cerebrovascular accident. The patient presented to the hospital with significantly elevated blood pressures and slurred speech, left-sided facial droop and left upper extremity weakness. This was likely related to an acute stroke, as was confirmed on  MRI findings in the hospital. She had an acute  right pontine cerebrovascular accident. The patient was started on baby aspirin, on statin. Her blood pressures were controlled. She received physical therapy and occupational therapy and speech in the hospital. She is currently tolerating oral well. She was referred for home health physical therapy and occupational therapy services, although she cannot afford it, as she has no insurance. For now, she is going to be discharged on a baby aspirin, statin and antihypertensives and strongly advised to quit smoking and drinking and be compliant with her medications as mentioned.  Her weakness and her slurred speech has significantly improved since admission.  2. Malignant hypertension. The patient has a history of hypertension and was not taking any meds prior to coming in. She was started on some oral labetalol in the hospital along with some ACE inhibitor also placed on some as needed hydralazine. Her hemodynamics have significantly improved since admission. I am presently discharging her on some generic meds including clonidine and hydralazine with close follow-up with the [facility] as an outpatient.  3.  Tobacco abuse. The patient was strongly advised to quit smoking. She was maintained on a nicotine patch while in the hospital.  4.  Acute renal failure. The patient's creatinine on admission was 1.0; it went up to as high as 1.9, shortly after initiation of ACE inhibitor therapy. I therefore discontinued her ACE inhibitor therapy, hydrate her with IV fluids. Her creatinine has since then has come down and improved. This should further be followed up as an  outpatient, as she may have she may be a high risk for hypertensive renal disease, given her noncompliance.  5.  Urinary tract infection. The patient was noted to have an abnormal urinalysis. She is being discharged on oral ciprofloxacin.  6.  Hyperlipidemia. The patient was started on a statin, given acute  cerebrovascular accident. She is being discharged on simvastatin as mentioned.   CODE STATUS: The patient is a full code.   TIME SPENT ON DISCHARGE: 40 minutes   ____________________________ Rolly PancakeVivek J. Cherlynn KaiserSainani, MD vjs:cc D: 09/18/2012 14:52:28 ET T: 09/18/2012 21:27:41 ET JOB#: [id number]  cc: Rolly PancakeVivek J. Cherlynn KaiserSainani, MD, <Dictator> [facility] [place] SirenVIVEK J SAINANI MD ELECTRONICALLY SIGNED 10/02/2012 20:26

## 2014-08-17 NOTE — Consult Note (Signed)
PATIENT NAME:  Kimberly Mccarthy, Kimberly Mccarthy MR#:  [id number] DATE OF BIRTH:  Feb 25, 1956  DATE OF CONSULTATION:  09/15/2012  REFERRING PHYSICIAN:   CONSULTING PHYSICIAN:  Pauletta Browns, MD  REASON FOR NEUROLOGIC CONSULTATION:  Left facial droop and slurred speech  HISTORY OF PRESENT ILLNESS:  The patient is a 59 year old female with past medical history of hypertension, presenting with a one day history of slurred speech and left facial droop, first noted last afternoon when the patient was sitting with her sister conversing and then suddenly her sister had difficulty understanding her.  It was noted that the patient had left facial droop. The patient was not a TPA candidate since she came to Emergency Department outside of the TPA window, as she had to be convinced to come to the ED by her sister. EMS reported her blood pressure to be 280.  Her blood pressure in the 80 was 213/99. She has a chronic history of hypertension, does not appear to be compliant with her medications and the reason for that is the patient states that she could not afford it. The patient is status post MRI of the brain that showed acute ischemic infarct, right pons.   REVIEW OF SYSTEMS:  CONSTITUTIONAL:  No fevers, positive fatigue.  EYES: No blurred vision.  ENT: No ear pain. No hearing loss.  RESPIRATORY: No cough, no wheezing, no hemoptysis.  CARDIOVASCULAR: No chest pain, orthopnea.  GASTROINTESTINAL: No nausea. No vomiting, no diarrhea.  GENITOURINARY: No dysuria, dysuria, hematuria.  MUSCULOSKELETAL: No pain in the shoulders. PSYCHIATRIC No history of anxiety or depression.   PAST MEDICAL HISTORY:  Hypertension.   MEDICATIONS: Currently not taking any.  ALLERGIES:  No known drug allergies.   PAST SURGICAL HISTORY: None.   SOCIAL HISTORY: The patient smokes half to a pack per day for the past 40 years. She  drinks alcohol daily.  At time she states if she can afford it, she can drink a 24 pack in 2 to 3 days.    FAMILY HISTORY: Positive for hypertension, diabetes, coronary artery disease.    PHYSICAL EXAMINATION: VITAL SIGNS: Temperature 98.7, pulse 61, respirations 20, blood pressure 180/81. GENERAL:  The patient is alert, oriented to place, time, location and able to tell me the president of the [place].  CARDIOVASCULAR: Rhythm, S1, S2 no murmurs. EXTREMITIES:  No cyanosis no clubbing.   NEUROLOGIC EXAMINATION:  Extraocular movements appear to be intact in terms of mentation, attention, concentration intact. The patient was able tell me how many quarters are in $1.75.  The rest of the cranial nerve examination: Extraocular movements are intact. Pupils 3 mm to 2 mm, reactive bilaterally. Mild left facial droop present. Tongue deviates to the left. Uvula is midline. Motor strength examination, there are significant drift the left upper extremity.  There is 4/5 diminished grip and diminished fine motor movements of left upper extremity. The left lower extremity is also 4/5, the rest is 5/5 and this is acute as per patient.   RADIOLOGY DATA: MRI of the brain as above. Right pontine infarct. She is status post carotid Doppler: 50% to 75% bilaterally. No hemodynamic significant stenosis.  Echo with bubble study no signs of PFO.   ASSESSMENT: A 59 year old female with hypertension, chronic smoker, alcohol abuse who presents with right pontine infarct in the setting of uncontrolled hypertension.   PLAN: Status post work-up, including MRI, carotid echocardiogram, physical therapy on board. The patient will require physical therapy with home care.  At this point, I believe  it would be difficult to obtain as the patient is not insured and has difficulty for medications. It is important for her to have a blood pressure control as I spoke to the patient and discussed it.  Also smoking cessation. The patient is a 40 pack-year smoker.  Alcohol cessation. The patient states she drinks up to a 24 pack in 2 to 3  days. Please monitor the patient for DTs as the patient is here for a day and can have alcohol withdrawal.  Blood pressure control with current antihypertensives. Consider possible social work evaluation as the patient is uninsured and will need assistance with paying for her medications. Continue aspirin 325 and statin when the patient is discharged home. At this point, I do not think there is any need for MRA as carotid Dopplers were 50% to 75%. The patient needs a primary care physician upon discharge. Thank you. It was a pleasure seeing this patient.     ____________________________ Pauletta BrownsYuriy Jonnie Truxillo, MD yz:ct D: 09/17/2012 11:46:06 ET T: 09/17/2012 13:08:46 ET JOB#: [id number]  cc: Pauletta BrownsYuriy Salam Chesterfield, MD, <Dictator> Pauletta BrownsYURIY Antia Rahal MD ELECTRONICALLY SIGNED 09/23/2012 14:38

## 2014-08-17 NOTE — Consult Note (Signed)
PATIENT NAME:  Kimberly Mccarthy, Kimberly Mccarthy MR#:  [id number] DATE OF BIRTH:  25-Nov-1955  DATE OF CONSULTATION:  09/17/2012  REFERRING PHYSICIAN:   CONSULTING PHYSICIAN:  Pauletta Browns, MD  REASON FOR NEUROLOGIC CONSULTATION:  Left facial droop and slurred speech  HISTORY OF PRESENT ILLNESS:  The patient is a 59 year old female with past medical history of hypertension, presenting with a one day history of slurred speech and left facial droop, first noted last afternoon when the patient was sitting with her sister conversing and then suddenly her sister had difficulty understanding her.  It was noted that the patient had left facial droop. The patient was not a TPA candidate since she came to Emergency Department outside of the TPA window, as she had to be convinced to come to the ED by her sister. EMS reported her blood pressure to be 280.  Her blood pressure in the 80 was 213/99. She has a chronic history of hypertension, does not appear to be compliant with her medications and the reason for that is the patient states that she could not afford it. The patient is status post MRI of the brain that showed acute ischemic infarct, right pons.   REVIEW OF SYSTEMS:  CONSTITUTIONAL:  No fevers, positive fatigue.  EYES: No blurred vision.  ENT: No ear pain. No hearing loss.  RESPIRATORY: No cough, no wheezing, no hemoptysis.  CARDIOVASCULAR: No chest pain, orthopnea.  GASTROINTESTINAL: No nausea. No vomiting, no diarrhea.  GENITOURINARY: No dysuria, dysuria, hematuria.  MUSCULOSKELETAL: No pain in the shoulders. PSYCHIATRIC No history of anxiety or depression.   PAST MEDICAL HISTORY:  Hypertension.   MEDICATIONS: Currently not taking any.  ALLERGIES:  No known drug allergies.   PAST SURGICAL HISTORY: None.   SOCIAL HISTORY: The patient smokes half to a pack per day for the past 40 years. She  drinks alcohol daily.  At time she states if she can afford it, she can drink a 24 pack in 2 to 3 days.    FAMILY HISTORY: Positive for hypertension, diabetes, coronary artery disease.    PHYSICAL EXAMINATION: VITAL SIGNS: Temperature 98.7, pulse 61, respirations 20, blood pressure 180/81. GENERAL:  The patient is alert, oriented to place, time, location and able to tell me the president of the [place].  CARDIOVASCULAR: Rhythm, S1, S2 no murmurs. EXTREMITIES:  No cyanosis no clubbing.   NEUROLOGIC EXAMINATION:  Extraocular movements appear to be intact in terms of mentation, attention, concentration intact. The patient was able tell me how many quarters are in $1.75.  The rest of the cranial nerve examination: Extraocular movements are intact. Pupils 3 mm to 2 mm, reactive bilaterally. Mild left facial droop present. Tongue deviates to the left. Uvula is midline. Motor strength examination, there are significant drift the left upper extremity.  There is 4/5 diminished grip and diminished fine motor movements of left upper extremity. The left lower extremity is also 4/5, the rest is 5/5 and this is acute as per patient.   RADIOLOGY DATA: MRI of the brain as above. Right pontine infarct. She is status post carotid Doppler: 50% to 75% bilaterally. No hemodynamic significant stenosis.  Echo with bubble study no signs of PFO.   ASSESSMENT: A 59 year old female with hypertension, chronic smoker, alcohol abuse who presents with right pontine infarct in the setting of uncontrolled hypertension.   PLAN: Status post work-up, including MRI, carotid echocardiogram, physical therapy on board. The patient will require physical therapy with home care.  At this point, I believe  it would be difficult to obtain as the patient is not insured and has difficulty for medications. It is important for her to have a blood pressure control as I spoke to the patient and discussed it.  Also smoking cessation. The patient is a 40 pack-year smoker.  Alcohol cessation. The patient states she drinks up to a 24 pack in 2 to 3  days. Please monitor the patient for DTs as the patient is here for a day and can have alcohol withdrawal.  Blood pressure control with current antihypertensives. Consider possible social work evaluation as the patient is uninsured and will need assistance with paying for her medications. Continue aspirin 325 and statin when the patient is discharged home. At this point, I do not think there is any need for MRA as carotid Dopplers were 50% to 75%. The patient needs a primary care physician upon discharge. Thank you. It was a pleasure seeing this patient.    ____________________________ Pauletta BrownsYuriy Marijane Trower, MD yz:ct D: 09/17/2012 11:46:00 ET T: 09/17/2012 13:08:46 ET JOB#: [id number]  cc: Pauletta BrownsYuriy Eldora Napp, MD, <Dictator> Pauletta BrownsYURIY Chaska Hagger MD ELECTRONICALLY SIGNED 10/29/2012 11:40

## 2014-08-17 NOTE — Consult Note (Signed)
PATIENT NAME:  Kimberly Mccarthy, Kimberly Mccarthy MR#:  [id number] DATE OF BIRTH:  1956/02/21  FOLLOWUP DICTATION  DATE OF CONSULTATION:  09/18/2012  CONSULTING PHYSICIAN:  Pauletta BrownsYuriy Jaliana Medellin, MD  This is a 59 year old female with a past medical history of hypertension, presenting with slurred speech and a left facial droop. The patient was found to have a right pontine infarct. She was not on aspirin at home. Was not taking any medications.    Current physical examination has improved since yesterday. Her speech is close to baseline, and improvement in the left upper and left lower extremity weakness.   PHYSICAL EXAMINATION: Extraocular movements appear to be intact. Mentation intact. Concentration intact. Pupils 3 mm to 2 mm, reactive bilaterally. Left facial droop has subsided. Tongue appears to be midline now, and elevates symmetrically. Motor strength appears to be  5-/5, left upper extremity; 4/5 left lower extremity. The rest is 5/5. Coordination is intact. Gait not assessed.   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   ASSESSMENT: A 59 year old female with a past medical history of hypertension, chronic smoker, alcohol abuse, presenting with right pontine infarct. Symptoms have improved.   PLAN: The patient is to be discharged home today, aspirin 325, statin daily. The patient is to follow up with neurology as outpatient. The patient has chronic hypertension, not compliant with medication. It was expressed to her the importance of being compliant with her medication.   Smoking cessation and diet control. The patient will need home PT.   Thank you. It was a pleasure seeing this patient.     ___________________________ Pauletta BrownsYuriy Annisha Baar, MD yz:dm D: 09/18/2012 11:47:04 ET T: 09/18/2012 12:17:15 ET JOB#: [id number]  cc: Pauletta BrownsYuriy Jelesa Mangini, MD, <Dictator> Pauletta BrownsYURIY Abbagail Scaff MD ELECTRONICALLY SIGNED 09/23/2012 14:38

## 2014-08-18 NOTE — Discharge Summary (Signed)
PATIENT NAME:  Kimberly Mccarthy, Kimberly Mccarthy MR#:  [id number] DATE OF BIRTH:  1955-07-04  DATE OF ADMISSION:  03/15/2014 DATE OF DISCHARGE:  03/23/2014  DISCHARGE DIAGNOSES: 1.  Gastritis.  2.  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  3.  Iron deficiency anemia.  4.  Anemia of chronic disease.  5.  Acute renal failure.  6.  Mild acute blood loss anemia.  7.  Hyperkalemia.  8.  Acute encephalopathy.  9.  Left lower extremity wound status post vascular surgery.  10.  Vocal cord polyp. ENT outpatient follow-up.  11.  Hypertension.   CONSULTANTS: Dr. Marva Panda with gastroenterology and Dr. Thedore Mins with nephrology.   IMAGING STUDIES: Include a chest x-ray, portable, that showed right lower lobe atelectasis.   Ultrasound of kidneys bilateral showed no hydronephrosis.   ADMITTING HISTORY AND PHYSICAL: Please see detailed H and P dictated previously. In brief, a 59 year old patient who presented to the hospital with altered mental status, was found to have acute renal failure and dehydrated.   HOSPITAL COURSE: 1.  Acute renal failure, severe dehydration. The patient was taken off all nephrotoxic medications. Was aggressively fluid resuscitated with which her kidney function slowly improved back to normal range. By the day of discharge, the patient has normal urine output, normal creatinine levels. Her acute encephalopathy, which was secondary to acute renal failure, has resolved. Also her acute encephalopathy was thought to be secondary to narcotic medications, which have been reduced in dose.  2.  Gastritis and duodenal AVMs with GI bleed. The patient had an endoscopy done, which showed gastritis. She also had some duodenal AVMs, but they were deep and were not bleeding. She has been started on PPIs and sucralfate, was followed by Dr. Marva Panda during the hospital stay and has been started on iron pills at discharge. Hemoglobin stable by day of discharge.  3.  Recent vascular surgery. The  patient was seen by wound care nurse and home health with wound care has been set up at time of discharge.  4.  The patient was continued on her dialysis for end-stage renal disease during the hospital stay.  5.  There was an incidental finding of a vocal cord polyp on her endoscopy for which she has been referred to ENT.   DISCHARGE PHYSICAL EXAMINATION: Prior to discharge, the patient's lungs sound clear. Heart sounds are S1, S2. No abdominal tenderness on examination. Bowel sounds present.   DISCHARGE MEDICATIONS: 1.  Levothyroxine 50 mcg daily.  2.  Multivitamin 1 tablet daily.  3.  Nystatin topical to effected area twice a day.  4.  Amlodipine 10 mg daily.  5.  Coreg 25 mg daily.  6.  Clonidine 0.3 mg transdermal patch once a week.  7.  Ferrous sulfate 325 mg 2 times a day.  8.  Pravastatin 10 mg daily.  9.  Gabapentin 300 mg oral 2 times a day.  10.  Protonix 40 mg oral 2 times a day.  11.  Acetaminophen/hydrocodone 325/50 one tablet every 6 hours as needed.  12.  Carafate 1 gram 3 times a day before meals.   NOTE: The patient's lisinopril, oxycodone, aspirin and Plavix have been held.   DISCHARGE INSTRUCTIONS: The patient is being discharged home with home health with PT and nurse for wound care. Follow up with primary care physician, Dr. Marva Panda in ENT in 1 to 2 weeks. The patient was initially thought to be a candidate to go to SNF, but was not accepted in any skilled nursing facilities  in the county, and the patient did not want to go out of the county and has agreed to go home with home health. The patient can be restarted on her aspirin and Plavix when she follows up with Dr. Marva PandaSkulskie and she does not have any further bleeding.   TIME SPENT ON DAY OF DISCHARGE IN DISCHARGE ACTIVITY: 40 minutes.   ____________________________ Molinda BailiffSrikar R. Jaeleah Smyser, MD srs:sb D: 03/26/2014 12:47:02 ET T: 03/26/2014 13:24:11 ET JOB#: [id number]  cc: Wardell HeathSrikar R. Mataio Mele, MD, <Dictator> Orie FishermanSRIKAR R Jeyda Siebel  MD ELECTRONICALLY SIGNED 03/26/2014 15:17

## 2014-08-18 NOTE — Consult Note (Signed)
Chief Complaint:  Subjective/Chief Complaint seen for anemia and heme positive stool .  eating some of meals, mostly meats.   denies n or abdominal pain.   VITAL SIGNS/ANCILLARY NOTES: **Vital Signs.:   22-Nov-15 11:40  Vital Signs Type Routine  Temperature Temperature (F) 98.5  Celsius 36.9  Temperature Source oral  Pulse Pulse 83  Respirations Respirations 20  Systolic BP Systolic BP 749  Diastolic BP (mmHg) Diastolic BP (mmHg) 80  Mean BP 109  Pulse Ox % Pulse Ox % 100  Pulse Ox Activity Level  At rest  Oxygen Delivery Room Air/ 21 %   Brief Assessment:  Cardiac Regular   Respiratory clear BS   Gastrointestinal details normal Soft  Nontender  Nondistended  No masses palpable  Bowel sounds normal   Lab Results: Routine Chem:  22-Nov-15 05:57   Glucose, Serum 96  BUN  29  Creatinine (comp)  1.39  Sodium, Serum 145  Potassium, Serum 4.8  Chloride, Serum  115  CO2, Serum 21  Calcium (Total), Serum 8.5  Anion Gap 9  Osmolality (calc) 294  eGFR (African American)  50  eGFR (Non-African American)  42 (eGFR values <31m/min/1.73 m2 may be an indication of chronic kidney disease (CKD). Calculated eGFR, using the MRDR Study equation, is useful in  patients with stable renal function. The eGFR calculation will not be reliable in acutely ill patients when serum creatinine is changing rapidly. It is not useful in patients on dialysis. The eGFR calculation may not be applicable to patients at the low and high extremes of body sizes, pregnant women, and vegetarians.)  Routine Hem:  22-Nov-15 05:57   WBC (CBC) 7.1  RBC (CBC)  2.72  Hemoglobin (CBC)  7.8  Hematocrit (CBC)  24.1  Platelet Count (CBC) 385  MCV 89  MCH 28.6  MCHC 32.3  RDW  17.0  Neutrophil % 59.7  Lymphocyte % 24.3  Monocyte % 14.2  Eosinophil % 1.0  Basophil % 0.8  Neutrophil # 4.2  Lymphocyte # 1.7  Monocyte #  1.0  Eosinophil # 0.1  Basophil # 0.1 (Result(s) reported on 18 Mar 2014 at  06:13AM.)   Radiology Results: [place]    21-Nov-15 09:08, [place]  [place]   REASON FOR EXAM:    acute renal failure  COMMENTS:       PROCEDURE: [place] - [place]  - Mar 17 2014  9:08AM     CLINICAL DATA:  Acute renal failure.    EXAM:  RENAL/URINARY TRACT ULTRASOUND COMPLETE    COMPARISON:  None.    FINDINGS:  Right Kidney:  Length: 10.3 cm. Echogenicity within normal limits. No mass or  hydronephrosis visualized.    Left Kidney:    Length: 9.1 cm. Echogenicity within normal limits. No mass or  hydronephrosis visualized.    Bladder:    Appears normal for degree of bladder distention.     IMPRESSION:  No hydronephrosis.  Electronically Signed    By: ALogan Bores   On: 03/17/2014 10:25         Verified By: AFerol Luz M.D.,   Assessment/Plan:  Assessment/Plan:  Assessment 1)  anemia, heme positive.  patient with h/o anal fissure with recent egd [facility]  anemia likely multifactorial, acd, blood loss, ckd.  no evodence of ongoing GI bleeding, appropriate response to tfx yesterday.   Plan 1) I have requested results of egd and colonoscopy again.  continue current.  2)  awaitng result of h. pylori testing.  following.   Electronic Signatures: Loistine Simas (MD)  (Signed [phone] 13:19)  Authored: Chief Complaint, VITAL SIGNS/ANCILLARY NOTES, Brief Assessment, Lab Results, Radiology Results, Assessment/Plan   Last Updated: 22-Nov-15 13:19 by Loistine Simas (MD)

## 2014-08-18 NOTE — Consult Note (Signed)
PATIENT NAME:  Kimberly Mccarthy, Kimberly Mccarthy MR#:  [id number] DATE OF BIRTH:  29-Jun-1955  DATE OF CONSULTATION:  03/16/2014  REFERRING PHYSICIAN:  Hope PigeonVaibhavkumar G. Elisabeth PigeonVachhani, MD   CONSULTING PHYSICIAN:  Keturah Barrehristiane H. [place], NP  REASON FOR CONSULTATION: GI consult ordered by Dr. Elisabeth PigeonVachhani for low hemoglobin and guaiac stool.   HISTORY OF PRESENT ILLNESS: Appreciate consult for this 59 year old African American woman with a complex health history including CVA in 2014, Plavix therapy, significant PVD with recent femoropopliteal bypass in October 2015, left toe gangrene, sacral pressure ulcer, hypertension, who was admitted with altered mental status for evaluation of anemia with heme-positive stool and found to be in renal failure. Evidently, the patient had colonoscopy and EGD less than a month ago at [facility]. These records have been requested this afternoon. Sisters aids with her care at home and history as the patient has AMS and is unable to provide details. Sister thinks colonoscopy had benign findings, thinks EGD just showed GERD, but unsure. States patient had no abdominal pain, nausea, vomiting, diarrhea, or uncontrolled reflux. Stools have been dark brown and soft. No melena or hematochezia, or other GI complaint. She reports that the patient has only been complaining of pain to her left leg since surgery and sacral pain. She has a decubitus ulcer. Reports her altered mental status developed over the last week. She was admitted with a hemoglobin of 5.9 and received a unit of packed red blood cells and it is now up 6.8. Her ferritin is normal. Serum iron is low, iron saturation is low. BUN 75, creatinine 3.93. LFTs are normal. Sister states no history of liver disease. She is on pantoprazole, Plavix, iron, stool softeners at home. Evidently, early in 2014, she used BC powders, but has not had any since her stroke (that was May 2014). Nephrology has been consulted for her renal failure. Wound center has been consulted for  her leg and sacral wound.   PAST MEDICAL HISTORY: PVD, CVA in 2014, hypertension, hypothyroidism, chronic kidney disease stage II, recent left toe gangrene, [facility]   SOCIAL HISTORY: Lives with her sister Claris GowerCharlotte. No tobacco, alcohol, or illicits. Uses a cane or walker for mobility.   FAMILY HISTORY: No family history of cancer. Significant for CAD.   ALLERGIES: No known allergies.   HOME MEDICATIONS: Pravastatin 10 mg p.o. daily, pantoprazole 40 mg p.o. daily, oxycodone 20 mg extended release 1 tab q. 12 hours, oxycodone 20 mg 0.5 tablet q. 6 hours p.r.n., nystatin powder b.i.d. p.r.n., nifedipine/lidocaine in petroleum ointment q.i.d. p.r.n., anal/rectal discomfort, Bactroban ointment t.i.d. to wound, multivitamin once a day, lisinopril 2.5 mg once a day, levothyroxine 50 mcg once a day, hydralazine 100 mg q. 8 hours, gabapentin 100 mg in the morning, 200 mg at bedtime, furosemide 20 mg once a day, iron 325 mg b.i.d., Plavix 75 mg once a day, clonidine 0.3 mg/24-hour patch one patch a day, carvedilol 25 mg p.o. b.i.d., aspirin 81 mg p.o. daily, amlodipine 10 mg p.o. daily.   REVIEW OF SYSTEMS: The patient is unable to do the review. Please see above.   MOST RECENT LABORATORY DATA: Glucose 86, iron 23, BUN 75, creatinine 3.93, sodium 135, potassium 5.0, GFR 15, calcium 8.5, total protein 7.5, albumin 3, total bilirubin 0.2, ALP 82, AST 12, ALT normal. Troponin normal. WBC 6.5, hemoglobin 6.8, hematocrit 20.6, platelet count 393,000. B12 level normal.   PHYSICAL EXAMINATION:  MOST RECENT VITAL SIGNS: Temperature 98.1, pulse 75, respiratory rate 18,  blood pressure 119/66, SAO2 100% on room air.  GENERAL: Somewhat ill-appearing woman resting in bed in no acute distress.  HEENT: Normocephalic, atraumatic. Sclerae are clear. Conjunctivae pink.  NECK: Supple. No thyromegaly, no lymphadenopathy.  CHEST: Respirations eupneic. Lungs clear.  CARDIAC: S1,  S2. RRR. No MRG. Trace generalized edema.  ABDOMEN: Soft. Bowel sounds x 4, nondistended, nontender. No guarding, rigidity, rebound tenderness, peritoneal signs, or other abnormalities, including hepatosplenomegaly.  RECTAL: She declines internal rectal. There is a large non-irritated external hemorrhoid.  SKIN: Warm, dry, pink. There are dressings to the left foot and leg that are intact. There is a sacral decubitus dressing that is intact. I do not see any erythema or rash.  NEUROLOGIC: Alert, oriented x 2. Cranial nerves II through XII intact. She is somewhat drowsy. She is cooperative, but does not always verbalize and does not always participate in the conversation. Her speech is clear when she talks.  PSYCHIATRIC: Pleasant, somewhat tangential.   IMPRESSION AND PLAN: Anemia with heme-positive stool: Anemia is likely multifactorial due to her kidney disease, recent vascular surgery, blood thinners. There has been no melena or hematochezia. For now, we will continue proton pump inhibitor, follow hemoglobin, and transfuse p.r.n. Would like her records from [place] before deciding on further intervention. Further recommendations are to follow.   Thank you very much for this consult.   These services were provided by Vevelyn Pat, MSN, [facility], in collaboration with Barnetta Chapel, MD with whom I have discussed this patient in full.    ____________________________ Keturah Barre, NP chl:ts D: 03/16/2014 17:55:21 ET T: 03/16/2014 19:01:07 ET JOB#: [id number]  cc: Keturah Barre, NP, <Dictator> Eustaquio Maize LONDON FNP ELECTRONICALLY SIGNED 03/19/2014 17:14

## 2014-08-18 NOTE — Consult Note (Signed)
Chief Complaint:  Subjective/Chief Complaint seen for anemia, heme positive stool.  Denies abdominal pain or nausea.  Heme positive black stool yesterday.   VITAL SIGNS/ANCILLARY NOTES: **Vital Signs.:   25-Nov-15 08:55  Vital Signs Type Q 8hr  Temperature Temperature (F) 98.3  Celsius 36.8  Temperature Source oral  Pulse Pulse 84  Respirations Respirations 18  Systolic BP Systolic BP 125  Diastolic BP (mmHg) Diastolic BP (mmHg) 74  Mean BP 91  Pulse Ox % Pulse Ox % 100  Pulse Ox Activity Level  At rest  Oxygen Delivery Room Air/ 21 %   Brief Assessment:  Cardiac Regular   Respiratory clear BS   Gastrointestinal details normal Soft  Nontender  Nondistended  Bowel sounds normal   Lab Results: Routine Hem:  19-Nov-15 11:41   Hemoglobin (CBC)  5.9  20-Nov-15 02:54   Hemoglobin (CBC)  6.8  21-Nov-15 09:53   Hemoglobin (CBC)  6.3  22-Nov-15 05:57   Hemoglobin (CBC)  7.8  23-Nov-15 04:01   Hemoglobin (CBC)  7.4  Platelet Count (CBC) 386  24-Nov-15 06:13   Hemoglobin (CBC)  7.4 (Result(s) reported on 20 Mar 2014 at [facility]:08AM.)  25-Nov-15 04:43   Hemoglobin (CBC)  7.2 (Result(s) reported on 21 Mar 2014 at 05:28AM.)   Assessment/Plan:  Assessment/Plan:  Assessment 1) anemia, heme positive stool.  hemodynamically stable. some black stool, possible melena.  2) multiple medical issues with CKD, encephalopathy, neuropathy, lesions sacrum and lle   Plan 1) egd.  I have discussed the risks benefits and complications of proceedure to include not limited to bleeding infection perforationa dn sedation and she wishes to proceed./  further recs to follow.   Electronic Signatures: Barnetta ChapelSkulskie, Martin (MD)  (Signed [phone]-Nov-15 14:26)  Authored: Chief Complaint, VITAL SIGNS/ANCILLARY NOTES, Brief Assessment, Lab Results, Assessment/Plan   Last Updated: 25-Nov-15 14:26 by Barnetta ChapelSkulskie, Martin (MD)

## 2014-08-18 NOTE — Consult Note (Signed)
Chief Complaint:  Subjective/Chief Complaint seen for anemia and heme positive stool.  Much more alert today.  denies n/v or abdominal pain.   VITAL SIGNS/ANCILLARY NOTES: **Vital Signs.:   23-Nov-15 16:44  Vital Signs Type Routine  Temperature Temperature (F) 98.5  Celsius 36.9  Temperature Source oral  Pulse Pulse 95  Respirations Respirations 18  Systolic BP Systolic BP 153  Diastolic BP (mmHg) Diastolic BP (mmHg) 84  Mean BP 107  Pulse Ox % Pulse Ox % 100  Pulse Ox Activity Level  At rest  Oxygen Delivery Room Air/ 21 %  Telemetry pattern Cardiac Rhythm Normal sinus rhythm; pattern reported by Telemetry Clerk  *Intake and Output.:   23-Nov-15 03:36  Stool  pt had a large formed stool    06:26  Stool  smalll black formed stool    09:00  Stool  small, loose black stool   Brief Assessment:  Cardiac Regular   Respiratory clear BS   Gastrointestinal details normal Soft  Nontender  Nondistended  No masses palpable  Bowel sounds normal   Lab Results: General Ref:  21-Nov-15 09:53   Helicobacter pylori AB. IgG, IgA, IgM ========== TEST NAME ==========  ========= RESULTS =========  = REFERENCE RANGE =  HELICOBACTER P. AB PANEL  H pylori, IgM, IgG, IgA Ab H. pylori, IgG Abs              [H  2.6 U/mL             ]           0.0-0.8             Negative            <0.9                                             Indeterminate  0.9 - 1.0                                             Positive            >1.0 H. pylori, IgA Abs              [   Result Pending       ]    H. pylori, IgM Abs              [   <9.0 units           ]           0.0-8.9                                                Negative          <9.0                                                Equivocal   9.0 - 11.0  Positive         >11.0                                                                      .                This test was developed and its performance                 characteristics determined by [facility]. It has not been             cleared or approved by the Food and Drug Administration.                Results of this test are for investigational purposes                only. The result should not be used as a diagnostic                procedure without confirmation of the diagnosis by                another medically diagnostic product or procedure.               [facility]            No: [id number]           [address], [place], [place] [address]           Mila Homer, MD         [phone]   Result(s) reported on 19 Mar 2014 at 05:21PM.  Routine Hem:  19-Nov-15 11:41   Hemoglobin (CBC)  5.9  Platelet Count (CBC)  458 (Result(s) reported on 15 Mar 2014 at 12:07PM.)  20-Nov-15 02:54   Hemoglobin (CBC)  6.8  Platelet Count (CBC) 393  21-Nov-15 09:53   Hemoglobin (CBC)  6.3  Platelet Count (CBC) 377  22-Nov-15 05:57   Hemoglobin (CBC)  7.8  Platelet Count (CBC) 385  23-Nov-15 04:01   Hemoglobin (CBC)  7.4  Platelet Count (CBC) 386   Assessment/Plan:  Assessment/Plan:  Assessment 1) anemia, heme positive stool.  records from [facility] obtained today, showing egd and colonoscopy done 02/06/14.  EGD normal, colonoscopy showing several polyps removed adn diverticulosis and an anal fissure.   2) black stool reported today.  this is now on this hospitalization.  will repeat hemoccult and if positive reconsider repeating the egd.  Of note H pylori serology positive for IgG.  Patietn has been on ppi since admission.  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   Plan as noted above.   Electronic Signatures: Barnetta Chapel (MD)  (Signed [phone] 18:20)  Authored: Chief Complaint, VITAL SIGNS/ANCILLARY NOTES, Brief Assessment, Lab Results, Assessment/Plan   Last Updated: 23-Nov-15 18:20 by Barnetta Chapel  (MD)

## 2014-08-18 NOTE — Consult Note (Signed)
Chief Complaint:  Subjective/Chief Complaint seen for anemia, heme positive stool. denies n/v or abdominal apin.  no bm today.   VITAL SIGNS/ANCILLARY NOTES: **Vital Signs.:   24-Nov-15 15:50  Vital Signs Type Q 8hr  Temperature Temperature (F) 98.3  Celsius 36.8  Temperature Source oral  Pulse Pulse 77  Respirations Respirations 18  Systolic BP Systolic BP 101  Diastolic BP (mmHg) Diastolic BP (mmHg) 92  Mean BP 112  Pulse Ox % Pulse Ox % 100  Pulse Ox Activity Level  At rest  Oxygen Delivery Room Air/ 21 %  *Intake and Output.:   Shift 24-Nov-15 23:00  Length of Stay Totals Intake:  6182 Output:  7510    Net:  607   Brief Assessment:  Cardiac Regular   Respiratory clear BS   Gastrointestinal details normal Soft  Nontender  Nondistended  No masses palpable  Bowel sounds normal   Additional Physical Exam DRE- small amount of black granular material, heme positive.   Lab Results: Routine Chem:  24-Nov-15 06:13   Glucose, Serum 96  BUN 15  Creatinine (comp) 1.26  Sodium, Serum 142  Potassium, Serum 4.1  Chloride, Serum  111  CO2, Serum 22  Calcium (Total), Serum  8.3  Anion Gap 9  Osmolality (calc) 284  eGFR (African American)  56  eGFR (Non-African American)  47 (eGFR values <87m/min/1.73 m2 may be an indication of chronic kidney disease (CKD). Calculated eGFR, using the MRDR Study equation, is useful in  patients with stable renal function. The eGFR calculation will not be reliable in acutely ill patients when serum creatinine is changing rapidly. It is not useful in patients on dialysis. The eGFR calculation may not be applicable to patients at the low and high extremes of body sizes, pregnant women, and vegetarians.)  Routine Hem:  24-Nov-15 06:13   Hemoglobin (CBC)  7.4 (Result(s) reported on 20 Mar 2014 at 07:08AM.)   Assessment/Plan:  Assessment/Plan:  Assessment 1) heme positive stool and anemia. heme positiv today, possible melena recently.    Plan 1) will arrange for egd tomorrow.  I have discussed the risks benefits and complications of egd to include not limited to bleeding infection perforation and sedation and she wishes to proceed. continue ppi.   Electronic Signatures: SLoistine Simas(MD)  (Signed [phone]:47)  Authored: Chief Complaint, VITAL SIGNS/ANCILLARY NOTES, Brief Assessment, Lab Results, Assessment/Plan   Last Updated: 24-Nov-15 17:47 by SLoistine Simas(MD)

## 2014-08-18 NOTE — H&P (Signed)
PATIENT NAME:  Kimberly Mccarthy, Kimberly Mccarthy MR#:  [id number] DATE OF BIRTH:  01/03/1956  DATE OF ADMISSION:  03/15/2014  PRIMARY CARE PROVIDER:  [facility].    REFERRING EMERGENCY ROOM PHYSICIAN:  Dr. Cyril Loosen.   CHIEF COMPLAINT: Altered mental status.   HISTORY OF PRESENT ILLNESS: This 59 year old woman with past medical history of recent vascular surgery in October 2015, peripheral vascular disease, left great toe gangrene, CVA, and hypertension, presents today with weakness and altered mental status. The history is provided by her sisters as the patient is lethargic at the time of interview. Her 2 sisters report that for the past 2-3 days she has not been eating or drinking. She has been taking off her clothing and bandages off of her surgical wound. She has been disoriented to person and place. She has not been getting out of bed, able to stand for just a moment to use the toilet, otherwise unable to walk. She has been complaining of pain at the bottom of her feet, over her bottom, and at her surgical wound. At home they did check her blood pressure yesterday and found her systolic to be around 90. She has a home health nurse who has been following her and saw her this morning and recommended that she come to the Emergency Room. They deny any hematochezia or diarrhea. She has been constipated recently and had a normal bowel movement 2 days ago. That  bowel movement was reported to be dark, but not melanotic or bloody. No vomiting. No fevers or chills. No abdominal pain.   PAST MEDICAL HISTORY:   1.  Peripheral vascular disease.  2.  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  3.  Hypertension.  4.  Hypothyroidism.  5.  Chronic kidney disease stage II.   PAST SURGICAL HISTORY:  1.  Vascular procedure on the left leg on 02/16/2014 at [facility], records requested.  2.  Colonoscopy approximately 1 month ago at [facility], records requested.  SOCIAL HISTORY: The patient lives with her sister Claris Gower.  She does not smoke cigarettes, drink alcohol, or use illicit substances. She often uses a cane or a walker for mobility. She is disabled, not currently working.    FAMILY MEDICAL HISTORY: Positive for coronary artery disease in her mother and brother. No family history of cancers.   ALLERGIES: No known allergies.   HOME MEDICATIONS:  1. Pravastatin 10 mg 1 tablet once a day.  2. Pantoprazole 40 mg 1 tablet once a day.  3. Oxycodone 20 mg extended release 1 tablet every 12 hours.  4. Oxycodone 20 mg 0.5 tablets orally every 6 hours as needed for pain.  5. Nystatin topical 100,000 units per gram powder, apply to affected area twice a day.  6. Nifedipine-lidocaine in petroleum 0.3%-1.5% ointment apply topically to affected area 4 times a day as needed for anorectal discomfort.  7. Mupirocin 2% topical ointment apply to wound 3 times a day.  8. Multivitamins with minerals 1 tablet once a day.  9. Lisinopril 2.5 mg 1 tablet once a day.  10. Levothyroxine 50 mcg 1 tablet once a day.  11. Hydralazine 100 mg 1 tablet orally every 8 hours.  12. Gabapentin 100 mg 1 capsule once a day in the morning.  13. Gabapentin 100 mg 2 tablets once a day at bedtime.  14. Furosemide 20 mg 1 tablet once a day.  15. Ferrous sulfate 325 mg 1 tablet twice a day.  16. Clopidogrel 75 mg 1 tablet once a  day.  17. Clonidine 0.3 mg/24 hour patch, 1 patch once a day.  18. Carvedilol 25 mg 1 tablet twice a day.  19. Aspirin 81 mg 1 tablet once a day.  20. Amlodipine 10 mg 1 tablet once a day.   REVIEW OF SYSTEMS: The patient is unable to perform review of systems due to altered mental status. Her sisters deny fevers, chills, sweats, diarrhea, vomiting, hematochezia, melena, reports of abdominal pain, dysuria, frequency. Rest of symptoms as per HPI.    PHYSICAL EXAMINATION:  GENERAL: The patient appears critically ill. HEENT:  Pupils are small, minimally reactive, extraocular motion is intact, conjunctivae are clear  with no icterus, no injection, oral mucous membranes are dry, oropharynx is clear with no exudate, no lesions, neck is supple and symmetric, obese, no thyromegaly, no cervical lymphadenopathy.  RESPIRATORY: Lungs are clear to auscultation bilaterally with good air movement, no respiratory distress.  CARDIOVASCULAR: Heart sounds are distant, regular rate and rhythm, no murmurs, rubs, or gallops, peripheral pulses are 2 +.  GASTROINTESTINAL:  Abdomen is soft, nontender, nondistended, bowel sounds are decreased, no rebound, guarding or rigidity.  MUSCULOSKELETAL: Range of motion is normal in joints, no joint effusions, strength is 5 out of 5 throughout on evoked testing, she is very reluctant to move the left leg due to surgical wound.  SKIN: There is a large, deep surgical wound on the left leg starting with a 3 x 7 cm wound in the left upper medial thigh which contains granulation tissue and some thick yellow exudate that I think is a wound cream, this wound continues down the medial left leg to the mid lower leg, the left great toe is dry, gangrenous at the tip, toenail came off during my examination, the left foot and lower leg are edematous and tender.  NEUROLOGIC: Cranial nerves II through XII are grossly intact, she is slightly weaker on the left side, on the left lower extremity it is difficult to tell if this is due to pain or residual from post stroke, she is fairly lethargic at this time so it is difficult to assess her neurologic status.  PSYCHIATRIC: She is oriented at this time but lethargic, no signs of uncontrolled depression or anxiety.   LABORATORY DATA: Sodium 127, potassium 5.6, chloride 92, bicarbonate 85, creatinine at 7.26, glucose 99. LFTs, low albumin at 3.0, AST and ALT are low. Initial troponin less than 0.02. UDS negative. White blood cells 6.7, hemoglobin 5.9, platelets 458,000, MCV 89. UA negative for signs of infection.   IMAGING: Chest x-ray shows right lower lobe atelectasis  versus scar, no other acute finding.   IMPRESSION:  1.  Acute renal failure. Creatinine has gone from 1.5 to 7.26 over the past 5 months. I suspect this is a combination of decreased p.o. intake, hypovolemia, hypotension, as well as recent contrast exposure. I have consulted nephrology. We will hydrate aggressively with 150 mL per hour, she has already received 1 liter in the Emergency Room. Will need to be recheck BMET in the afternoon to reassess her potassium.  2.  Hyperkalemia: Likely due to renal failure. EKG shows no acute signs of change. We will hydrate and recheck.  3.  Acute anemia, hemoglobin has gone from 11.3 to 5.9 over the past 6 months. Suspect this is a combination of GI blood loss, (guaiac-positive stool in the ED today), renal disease, and surgical blood loss. Her family reports that she has had an EGD and colonoscopy about 1 month ago due to anemia  and there was no specific source of bleeding identified at that time. I have requested those records. Certainly chronic renal disease may be contributing. She may have some component of blood loss from surgical procedure. I have ordered iron studies. She will be transfused 1 unit packed red blood cells for now. We will also hold aspirin and Plavix at this time to ensure that her hemoglobin remains stable.  4.  Altered mental status: This is likely due to opiate medications, metabolic encephalopathy due to number 1 and number 2. We will hydrate. Hold sedating medications. At this time she does not show any specific signs of infection with no fever, white count, UA is negative and chest x-ray is negative. We will hold off on any antibiotics at this time. Wound does not appear grossly infected.  5.  Recent surgical procedure with large postsurgical wound: We will consult wound care service. Pain control, however with caution due to altered mental status.  6.  Peripheral vascular disease: I have requested records from [facility] regarding her recent  procedures to further elaborate on the procedures as well as the extent of her peripheral vascular disease. Unfortunately we will need to hold aspirin and Plavix at this time due to very low hemoglobin and concern for active bleeding.  7.  Hypertension: Blood pressure is currently controlled. We will hold her oral antihypertensives. If blood pressure increases can return to using her home regimen, for now hold.  8.  Hyperlipidemia: Continue her statin medication.  9.  Hypothyroidism: Continue with Synthroid.  10.  History of stroke: Continue statin. Unfortunately holding Plavix and aspirin due to concern about bleeding. Will request physical therapy consultation.  11.  Prophylaxis: We will use Protonix for GI prophylaxis. Hold off on pharmacological DVT prophylaxis due to concern for active bleeding.   TIME SPENT ON ADMISSION: 55 minutes.     ____________________________ Ena Dawley. Clent Ridges, MD cpw:bu D: 03/15/2014 15:05:56 ET T: 03/15/2014 15:30:27 ET JOB#: [id number]  cc: Santina Evans P. Clent Ridges, MD, <Dictator> Gale Journey MD ELECTRONICALLY SIGNED 03/15/2014 22:45

## 2014-08-18 NOTE — Consult Note (Signed)
Brief Consult Note: Diagnosis: AMS.   Patient was seen by consultant.   Consult note dictated.   Comments: Appreciate consult for 59 y/o [place] woman with complex health history including CVA 2014, plavix therapy, significant PVD with fem-pop bypass 2015, left toe gangrene, sacral pressure ulcer, htn, who was admitted with AMS for evaluation of anemia with heme positive stool. Evidenly patient had colon[facility] and [facility]. These records have been requested this afternoon. Sister aids with her care at home and  history, as patient with AMS and unable to give this. Sister thinks colonoscopy had benign findings. Thinks EGD just showed GERD, but unsure. States patient has had no abdominal pain, NVD, uncontrolled reflux. Stools have been dark brown and soft, no melena/hematochezia, or other GI complaint- she reports the patient has only been complaining of pain to her leg (since its surgery) and sacral pain (has wound). Reports her altered mental status developed over the last week. Received a unit of prbs, hgb now up to 6.8. Ferritin normal. Serum Fe low, Iron sat low. BUN 675, Creatinine 3.93. Lfts normal. Sister states no hx liver disease. Is on Pantoprazole, Plavix, Fe, and stool softeners at home.  Nephrology has been consulted for her renal failure. Wound center consulted for her leg and sacral wounds. Abdominal exam benign. Limited rectal exam reveals external hemorrhoids- she declines digital exam, Left leg dressings and sacral dressings intact.  Impression and plan: Anemia with heme pos stool. Anemia is likely multifactorial due to her kidney disease, recent vascular surgeries and is on blood thinners.There has been no melena or hematochezia.  For now would continue PPI, follow hgb, and xfuse prn. [facility] deciding on further intervention. Further recommendations to follow.  Electronic Signatures: Vevelyn PatLondon, Candace Begue H (NP)  (Signed  [phone]-Nov-15 16:28)  Authored: Brief Consult Note   Last Updated: 20-Nov-15 16:28 by Keturah BarreLondon, Shanoah Asbill H (NP)

## 2014-08-18 NOTE — Consult Note (Signed)
Chief Complaint:  Subjective/Chief Complaint Please see full GI consult and brief consult note.  Patient admitted with acute on chronic renal failure, anemia found to be heme positive, AMS and hyperkalemia.  Patient with history of recent egd and [facility] during her last hospitalization there.   I have gotten some information indiacting GI eval on 02/06/14 with egd and colonoscopy done showing only anal fissure with significant clot.  She is heme positive but no evidence of gross bleeding such as melena or hematochezia.  Has been on anticoagulants, and h/o CRI.   I am waiting on the complete GI proceedures for further detail.  Would transfuse as needed, continue ppi, check for h.pylori via stool antigen.  Will follow with you.   VITAL SIGNS/ANCILLARY NOTES: **Vital Signs.:   20-Nov-15 11:31  Vital Signs Type Routine  Temperature Temperature (F) 98.1  Celsius 36.7  Temperature Source oral  Pulse Pulse 75  Respirations Respirations 18  Systolic BP Systolic BP 119  Diastolic BP (mmHg) Diastolic BP (mmHg) 66  Mean BP 83  Pulse Ox % Pulse Ox % 100  Pulse Ox Activity Level  At rest  Oxygen Delivery Room Air/ 21 %    20:07  Vital Signs Type Routine  Temperature Temperature (F) 98.5  Celsius 36.9  Temperature Source oral  Pulse Pulse 83  Respirations Respirations 20  Systolic BP Systolic BP 139  Diastolic BP (mmHg) Diastolic BP (mmHg) 75  Mean BP 96  Pulse Ox % Pulse Ox % 100  Pulse Ox Activity Level  At rest  Oxygen Delivery Room Air/ 21 %   Electronic Signatures: Barnetta ChapelSkulskie, Indigo Barbian (MD)  (Signed [phone]-Nov-15 20:28)  Authored: Chief Complaint, VITAL SIGNS/ANCILLARY NOTES   Last Updated: 20-Nov-15 20:28 by Barnetta ChapelSkulskie, Yaa Donnellan (MD)

## 2014-08-18 NOTE — Consult Note (Signed)
Chief Complaint:  Subjective/Chief Complaint seen for anemia and heme positive stool.  denies n or abdominal pain.  hemodynamically stable.  seems somewhat lethargic but cooperative, poor communications.   VITAL SIGNS/ANCILLARY NOTES: **Vital Signs.:   21-Nov-15 11:54  Vital Signs Type Routine  Temperature Temperature (F) 98.4  Celsius 36.8  Temperature Source oral  Pulse Pulse 105  Respirations Respirations 20  Systolic BP Systolic BP 539  Diastolic BP (mmHg) Diastolic BP (mmHg) 79  Mean BP 109  Pulse Ox % Pulse Ox % 100  Pulse Ox Activity Level  At rest  Oxygen Delivery Room Air/ 21 %   Brief Assessment:  Cardiac Regular   Respiratory clear BS   Gastrointestinal details normal Soft  Nontender  Nondistended  No masses palpable  Bowel sounds normal   Lab Results: Routine BB:  21-Nov-15 12:14   Crossmatch Unit 1 Ready (Result(s) reported on 17 Mar 2014 at 12:22PM.)  Routine Chem:  21-Nov-15 09:53   Glucose, Serum  102  BUN  41  Creatinine (comp)  1.64  Sodium, Serum 144  Potassium, Serum 5.0  Chloride, Serum  115  CO2, Serum 22  Calcium (Total), Serum  7.9  Anion Gap 7  Osmolality (calc) 297  eGFR (African American)  42  eGFR (Non-African American)  34 (eGFR values <96m/min/1.73 m2 may be an indication of chronic kidney disease (CKD). Calculated eGFR, using the MRDR Study equation, is useful in  patients with stable renal function. The eGFR calculation will not be reliable in acutely ill patients when serum creatinine is changing rapidly. It is not useful in patients on dialysis. The eGFR calculation may not be applicable to patients at the low and high extremes of body sizes, pregnant women, and vegetarians.)  Misc Urine Chem:  21-Nov-15 11:51   Creatinine, Urine 50.0  Protein, Random Urine 9  Protein/Creat Ratio (comp) 180 (Result(s) reported on 17 Mar 2014 at 12:29PM.)  Routine Hem:  21-Nov-15 09:53   WBC (CBC) 6.7  RBC (CBC)  2.17  Hemoglobin (CBC)   6.3  Hematocrit (CBC)  19.3  Platelet Count (CBC) 377  MCV 89  MCH 29.0  MCHC 32.6  RDW  16.2  Neutrophil % 66.3  Lymphocyte % 18.3  Monocyte % 13.7  Eosinophil % 0.5  Basophil % 1.2  Neutrophil # 4.4  Lymphocyte # 1.2  Monocyte # 0.9  Eosinophil # 0.0  Basophil # 0.1 (Result(s) reported on 17 Mar 2014 at 10:28AM.)   Radiology Results: [place]    21-Nov-15 09:08, [place]  [place]   REASON FOR EXAM:    acute renal failure  COMMENTS:       PROCEDURE: [place] - [place]  - Mar 17 2014  9:08AM     CLINICAL DATA:  Acute renal failure.    EXAM:  RENAL/URINARY TRACT ULTRASOUND COMPLETE    COMPARISON:  None.    FINDINGS:  Right Kidney:  Length: 10.3 cm. Echogenicity within normal limits. No mass or  hydronephrosis visualized.    Left Kidney:    Length: 9.1 cm. Echogenicity within normal limits. No mass or  hydronephrosis visualized.    Bladder:    Appears normal for degree of bladder distention.     IMPRESSION:  No hydronephrosis.  Electronically Signed    By: ALogan Bores   On: 03/17/2014 10:25         Verified By: AFerol Luz M.D.,   Assessment/Plan:  Assessment/Plan:  Assessment 1) ams ude-possible  metabolic encephalopathy re ARF.   2) anemia, heme positive.  no overt evidence of ongoing GI bleeding.  Evaluation at [facility] on 02/06/14 relates anal fissure bleeding as cause of acute drop of hgb at that time.   Plan 1) awaiting records form [facility].  agree with tfx as needed.  will place on ppi for ulcer prophy and check H pylori serology. was on protonic pta, and multiple anticoagulant agents.   Electronic Signatures: Loistine Simas (MD)  (Signed 21-Nov-15 13:38)  Authored: Chief Complaint, VITAL SIGNS/ANCILLARY NOTES, Brief Assessment, Lab Results, Radiology Results, Assessment/Plan   Last Updated: 21-Nov-15 13:38 by Loistine Simas (MD)

## 2014-08-20 LAB — SURGICAL PATHOLOGY

## 2014-09-02 ENCOUNTER — Other Ambulatory Visit: Payer: Self-pay

## 2014-09-02 ENCOUNTER — Encounter: Payer: Self-pay | Admitting: Emergency Medicine

## 2014-09-02 ENCOUNTER — Emergency Department
Admission: EM | Admit: 2014-09-02 | Discharge: 2014-09-02 | Disposition: A | Discharge status: Home / Self Care | Payer: Medicaid Other | Attending: Emergency Medicine | Admitting: Emergency Medicine

## 2014-09-02 ENCOUNTER — Emergency Department: Payer: Medicaid Other

## 2014-09-02 DIAGNOSIS — R112 Nausea with vomiting, unspecified: Secondary | ICD-10-CM | POA: Insufficient documentation

## 2014-09-02 DIAGNOSIS — I10 Essential (primary) hypertension: Secondary | ICD-10-CM | POA: Diagnosis not present

## 2014-09-02 DIAGNOSIS — R1013 Epigastric pain: Secondary | ICD-10-CM | POA: Diagnosis not present

## 2014-09-02 DIAGNOSIS — R109 Unspecified abdominal pain: Secondary | ICD-10-CM

## 2014-09-02 DIAGNOSIS — Z720 Tobacco use: Secondary | ICD-10-CM | POA: Insufficient documentation

## 2014-09-02 HISTORY — DX: Personal history of other venous thrombosis and embolism: Z86.718

## 2014-09-02 HISTORY — DX: Essential (primary) hypertension: I10

## 2014-09-02 HISTORY — DX: Disorder of thyroid, unspecified: E07.9

## 2014-09-02 HISTORY — DX: Anemia, unspecified: D64.9

## 2014-09-02 HISTORY — DX: Cerebral infarction, unspecified: I63.9

## 2014-09-02 LAB — COMPREHENSIVE METABOLIC PANEL
ALT: 14 U/L (ref 14–54)
AST: 18 U/L (ref 15–41)
Albumin: 4.3 g/dL (ref 3.5–5.0)
Alkaline Phosphatase: 81 U/L (ref 38–126)
Anion gap: 12 (ref 5–15)
BILIRUBIN TOTAL: 0.8 mg/dL (ref 0.3–1.2)
BUN: 16 mg/dL (ref 6–20)
CO2: 22 mmol/L (ref 22–32)
Calcium: 9.8 mg/dL (ref 8.9–10.3)
Chloride: 107 mmol/L (ref 101–111)
Creatinine, Ser: 0.96 mg/dL (ref 0.44–1.00)
GFR calc Af Amer: >60 mL/min (ref >60)
Glucose, Bld: 130 mg/dL — ABNORMAL HIGH (ref 65–99)
Potassium: 3.5 mmol/L (ref 3.5–5.1)
Sodium: 141 mmol/L (ref 135–145)
Total Protein: 8.4 g/dL — ABNORMAL HIGH (ref 6.5–8.1)

## 2014-09-02 LAB — URINALYSIS COMPLETE WITH MICROSCOPIC (ARMC ONLY)
BILIRUBIN URINE: NEGATIVE
GLUCOSE, UA: NEGATIVE mg/dL
LEUKOCYTES UA: NEGATIVE
Nitrite: NEGATIVE
Protein, ur: NEGATIVE mg/dL
Specific Gravity, Urine: 1.014 (ref 1.005–1.030)
pH: 8 (ref 5.0–8.0)

## 2014-09-02 LAB — CBC
HCT: 43.4 % (ref 35.0–47.0)
Hemoglobin: 14.3 g/dL (ref 12.0–16.0)
MCH: 28.5 pg (ref 26.0–34.0)
MCHC: 33 g/dL (ref 32.0–36.0)
MCV: 86.3 fL (ref 80.0–100.0)
PLATELETS: 294 10*3/uL (ref 150–440)
RBC: 5.03 MIL/uL (ref 3.80–5.20)
RDW: 17.6 % — AB (ref 11.5–14.5)
WBC: 6.3 10*3/uL (ref 3.6–11.0)

## 2014-09-02 LAB — LIPASE, BLOOD: Lipase: 25 U/L (ref 22–51)

## 2014-09-02 MED ORDER — GI COCKTAIL ~~LOC~~
ORAL | Status: AC
  Administered 2014-09-02: 30 mL via ORAL
  Filled 2014-09-02: qty 30

## 2014-09-02 MED ORDER — SODIUM CHLORIDE 0.9 % IV BOLUS (SEPSIS)
1000.0000 mL | Freq: Once | INTRAVENOUS | Status: AC
  Administered 2014-09-02: 1000 mL via INTRAVENOUS

## 2014-09-02 MED ORDER — ONDANSETRON HCL 4 MG PO TABS: 4.0000 mg | ORAL_TABLET | Freq: Three times a day (TID) | ORAL | Status: DC | PRN

## 2014-09-02 MED ORDER — GI COCKTAIL ~~LOC~~
30.0000 mL | Freq: Once | ORAL | Status: AC
  Administered 2014-09-02: 30 mL via ORAL

## 2014-09-02 NOTE — ED Notes (Signed)
MD updated patient 

## 2014-09-02 NOTE — Discharge Instructions (Signed)
Please seek medical attention for any high fevers, chest pain, shortness of breath, change in behavior, persistent vomiting, bloody stool or any other new or concerning symptoms. ° °Nausea and Vomiting °Nausea means you feel sick to your stomach. Throwing up (vomiting) is a reflex where stomach contents come out of your mouth. °HOME CARE  °· Take medicine as told by your doctor. °· Do not force yourself to eat. However, you do need to drink fluids. °· If you feel like eating, eat a normal diet as told by your doctor. °¨ Eat rice, wheat, potatoes, bread, lean meats, yogurt, fruits, and vegetables. °¨ Avoid high-fat foods. °· Drink enough fluids to keep your pee (urine) clear or pale yellow. °· Ask your doctor how to replace body fluid losses (rehydrate). Signs of body fluid loss (dehydration) include: °¨ Feeling very thirsty. °¨ Dry lips and mouth. °¨ Feeling dizzy. °¨ Dark pee. °¨ Peeing less than normal. °¨ Feeling confused. °¨ Fast breathing or heart rate. °GET HELP RIGHT AWAY IF:  °· You have blood in your throw up. °· You have black or bloody poop (stool). °· You have a bad headache or stiff neck. °· You feel confused. °· You have bad belly (abdominal) pain. °· You have chest pain or trouble breathing. °· You do not pee at least once every 8 hours. °· You have cold, clammy skin. °· You keep throwing up after 24 to 48 hours. °· You have a fever. °MAKE SURE YOU:  °· Understand these instructions. °· Will watch your condition. °· Will get help right away if you are not doing well or get worse. °Document Released: 09/30/2007 Document Revised: 07/06/2011 Document Reviewed: 09/12/2010 °Exit[employer]® Patient Information ©2015 [employer]. This information is not intended to replace advice given to you by your health care provider. Make sure you discuss any questions you have with your health care provider. ° °

## 2014-09-02 NOTE — ED Notes (Signed)
Pt vomited green liquid . Labs collected. Vitals reassessed. MD notified.

## 2014-09-02 NOTE — ED Notes (Signed)
Pt arrived via EMS from home. Pt states she has not been able to keep anything down. Pt has n/v since last night and then morning. Pt states she has not taken anything for relief.

## 2014-09-02 NOTE — ED Provider Notes (Signed)
[facility] Emergency Department Provider Note    ____________________________________________  Time seen: 1610  I have reviewed the triage vital signs and the nursing notes.   HISTORY  Chief Complaint Vomiting   History limited by: Not Limited   HPI Kimberly Mccarthy is a 59 y.o. female presents to the emergency department today because of nausea and vomiting. This started last night. She states it has been constant. It did get a little better after she received an antiemetic by EMS. She states that the vomiting was accompanied by some epigastric pain. She states the pain reminded her little bit of her acid reflux. She denies any fevers. She denies any chest pain or shortness of breath.    Past Medical History  Diagnosis Date  . Stroke     may 2015  . Hypertension   . Thyroid disease   . H/O blood clots   . Anemia     There are no active problems to display for this patient.   Past Surgical History  Procedure Laterality Date  . Left foot surgery  02/2014    No current outpatient prescriptions on file.  Allergies Review of patient's allergies indicates no known allergies.  Family History  Problem Relation Age of Onset  . Diabetes Mother   . Hypertension Mother   . Hypertension Sister   . Thyroid disease Sister     Social History History  Substance Use Topics  . Smoking status: Current Every Day Smoker -- 1.00 packs/day for 20 years  . Smokeless tobacco: Not on file  . Alcohol Use: No    Review of Systems  Constitutional: Negative for fever. Cardiovascular: Negative for chest pain. Respiratory: Negative for shortness of breath. Gastrointestinal: epigastric pain, nausea and vomiting Genitourinary: Negative for dysuria. Musculoskeletal: Negative for back pain. Skin: Negative for rash. Neurological: Negative for headaches, focal weakness or numbness.  10-point ROS otherwise  negative.  ____________________________________________   PHYSICAL EXAM:  VITAL SIGNS: ED Triage Vitals  Enc Vitals Group     BP 09/02/14 1505 252/114 mmHg     Pulse Rate 09/02/14 1505 100     Resp --      Temp --      Temp Source 09/02/14 1505 Oral     SpO2 09/02/14 1505 100 %     Weight 09/02/14 1505 180 lb (81.647 kg)     Height 09/02/14 1505 5\' 7"  (1.702 m)   Constitutional: Alert and oriented. Well appearing and in no distress. Eyes: Conjunctivae are normal. PERRL. Normal extraocular movements. ENT   Head: Normocephalic and atraumatic.   Nose: No congestion/rhinnorhea.   Mouth/Throat: Mucous membranes are moist.   Neck: No stridor. Hematological/Lymphatic/Immunilogical: No cervical lymphadenopathy. Cardiovascular: Normal rate, regular rhythm.  No murmurs, rubs, or gallops. Respiratory: Normal respiratory effort without tachypnea nor retractions. Breath sounds are clear and equal bilaterally. No wheezes/rales/rhonchi. Gastrointestinal: Soft and mildly tender in the epigastric region. No rebound, no guarding. No distention.  Genitourinary: Deferred Musculoskeletal: Normal range of motion in all extremities. No joint effusions.  No lower extremity tenderness nor edema. Neurologic:  Normal speech and language. No gross focal neurologic deficits are appreciated. Speech is normal.  Skin:  Skin is warm, dry and intact. No rash noted. Psychiatric: Mood and affect are normal. Speech and behavior are normal. Patient exhibits appropriate insight and judgment.  ____________________________________________    LABS (pertinent positives/negatives)  Labs Reviewed  CBC - Abnormal; Notable for the following:    RDW 17.6 (*)  All other components within normal limits  COMPREHENSIVE METABOLIC PANEL - Abnormal; Notable for the following:    Glucose, Bld 130 (*)    Total Protein 8.4 (*)    All other components within normal limits  URINALYSIS COMPLETEWITH MICROSCOPIC  ([facility])  - Abnormal; Notable for the following:    Color, Urine YELLOW (*)    APPearance CLEAR (*)    Ketones, ur TRACE (*)    Hgb urine dipstick 1+ (*)    Bacteria, UA RARE (*)    Squamous Epithelial / LPF 0-5 (*)    All other components within normal limits  LIPASE, BLOOD    ____________________________________________   EKG  EKG Time: 1446 Rate: 73 Rhythm: Sinus rhythm Axis: Normal Intervals: QTC 482 QRS: Q waves in V1 and V2 ST changes: No ST elevation    ____________________________________________    RADIOLOGY   IMPRESSION: Normal bowel gas pattern.  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  ____________________________________________   PROCEDURES  Procedure(s) performed: None  Critical Care performed: No  ____________________________________________   INITIAL IMPRESSION / ASSESSMENT AND PLAN / ED COURSE  Pertinent labs & imaging results that were available during my care of the patient were reviewed by me and considered in my medical decision making (see chart for details).  Patient here with vomiting and epigastric pain. On exam mild tenderness to the epigastric region. Currently not vomiting.  Will get blood work , UA. Will give fluids.  Workup without any clear etiology of patient's symptoms. Patient states she feels better. We will discharge home with prescription for Zofran. Discussed return precautions.  ____________________________________________   FINAL CLINICAL IMPRESSION(S) / ED DIAGNOSES  Final diagnoses:  Abdominal pain  Nausea and vomiting, vomiting of unspecified type     Phineas SemenGraydon Ziara Thelander, MD 09/02/14 2040

## 2014-09-05 ENCOUNTER — Emergency Department: Payer: Medicaid Other

## 2014-09-05 ENCOUNTER — Inpatient Hospital Stay
Admission: EM | Admit: 2014-09-05 | Discharge: 2014-09-07 | DRG: 392 | Disposition: A | Discharge status: Home / Self Care | Payer: Medicaid Other | Attending: Internal Medicine | Admitting: Internal Medicine

## 2014-09-05 DIAGNOSIS — K2900 Acute gastritis without bleeding: Secondary | ICD-10-CM | POA: Diagnosis present

## 2014-09-05 DIAGNOSIS — I161 Hypertensive emergency: Secondary | ICD-10-CM

## 2014-09-05 DIAGNOSIS — I6529 Occlusion and stenosis of unspecified carotid artery: Secondary | ICD-10-CM | POA: Diagnosis present

## 2014-09-05 DIAGNOSIS — Z8249 Family history of ischemic heart disease and other diseases of the circulatory system: Secondary | ICD-10-CM

## 2014-09-05 DIAGNOSIS — Z8349 Family history of other endocrine, nutritional and metabolic diseases: Secondary | ICD-10-CM

## 2014-09-05 DIAGNOSIS — Q2730 Arteriovenous malformation, site unspecified: Secondary | ICD-10-CM

## 2014-09-05 DIAGNOSIS — E039 Hypothyroidism, unspecified: Secondary | ICD-10-CM

## 2014-09-05 DIAGNOSIS — R1011 Right upper quadrant pain: Secondary | ICD-10-CM | POA: Diagnosis present

## 2014-09-05 DIAGNOSIS — Z8673 Personal history of transient ischemic attack (TIA), and cerebral infarction without residual deficits: Secondary | ICD-10-CM | POA: Diagnosis not present

## 2014-09-05 DIAGNOSIS — I10 Essential (primary) hypertension: Secondary | ICD-10-CM | POA: Diagnosis present

## 2014-09-05 DIAGNOSIS — Z7982 Long term (current) use of aspirin: Secondary | ICD-10-CM | POA: Diagnosis not present

## 2014-09-05 DIAGNOSIS — N179 Acute kidney failure, unspecified: Secondary | ICD-10-CM | POA: Diagnosis present

## 2014-09-05 DIAGNOSIS — D649 Anemia, unspecified: Secondary | ICD-10-CM | POA: Diagnosis present

## 2014-09-05 DIAGNOSIS — Z79899 Other long term (current) drug therapy: Secondary | ICD-10-CM | POA: Diagnosis not present

## 2014-09-05 DIAGNOSIS — I739 Peripheral vascular disease, unspecified: Secondary | ICD-10-CM | POA: Diagnosis present

## 2014-09-05 DIAGNOSIS — R778 Other specified abnormalities of plasma proteins: Secondary | ICD-10-CM | POA: Diagnosis present

## 2014-09-05 DIAGNOSIS — Z79891 Long term (current) use of opiate analgesic: Secondary | ICD-10-CM | POA: Diagnosis not present

## 2014-09-05 DIAGNOSIS — R748 Abnormal levels of other serum enzymes: Secondary | ICD-10-CM | POA: Diagnosis present

## 2014-09-05 DIAGNOSIS — F17210 Nicotine dependence, cigarettes, uncomplicated: Secondary | ICD-10-CM | POA: Diagnosis present

## 2014-09-05 DIAGNOSIS — K2970 Gastritis, unspecified, without bleeding: Secondary | ICD-10-CM | POA: Diagnosis present

## 2014-09-05 DIAGNOSIS — R7989 Other specified abnormal findings of blood chemistry: Secondary | ICD-10-CM | POA: Diagnosis present

## 2014-09-05 HISTORY — DX: Arteriovenous malformation, site unspecified: Q27.30

## 2014-09-05 HISTORY — DX: Peripheral vascular disease, unspecified: I73.9

## 2014-09-05 HISTORY — DX: Unspecified kidney failure: N19

## 2014-09-05 HISTORY — DX: Hypothyroidism, unspecified: E03.9

## 2014-09-05 HISTORY — DX: Hyperkalemia: E87.5

## 2014-09-05 HISTORY — DX: Occlusion and stenosis of unspecified carotid artery: I65.29

## 2014-09-05 LAB — URINALYSIS COMPLETE WITH MICROSCOPIC (ARMC ONLY)
GLUCOSE, UA: NEGATIVE mg/dL
Hgb urine dipstick: NEGATIVE
LEUKOCYTES UA: NEGATIVE
Nitrite: NEGATIVE
Protein, ur: >500 mg/dL — AB
Specific Gravity, Urine: 1.033 — ABNORMAL HIGH (ref 1.005–1.030)
pH: 5 (ref 5.0–8.0)

## 2014-09-05 LAB — COMPREHENSIVE METABOLIC PANEL
ALT: 103 U/L — AB (ref 14–54)
ANION GAP: 12 (ref 5–15)
AST: 128 U/L — ABNORMAL HIGH (ref 15–41)
Albumin: 4.3 g/dL (ref 3.5–5.0)
Alkaline Phosphatase: 77 U/L (ref 38–126)
BILIRUBIN TOTAL: 1.6 mg/dL — AB (ref 0.3–1.2)
BUN: 34 mg/dL — ABNORMAL HIGH (ref 6–20)
CALCIUM: 9.4 mg/dL (ref 8.9–10.3)
CO2: 26 mmol/L (ref 22–32)
Chloride: 105 mmol/L (ref 101–111)
Creatinine, Ser: 1.25 mg/dL — ABNORMAL HIGH (ref 0.44–1.00)
GFR calc non Af Amer: 46 mL/min — ABNORMAL LOW (ref >60)
GFR, EST AFRICAN AMERICAN: 54 mL/min — AB (ref >60)
Glucose, Bld: 111 mg/dL — ABNORMAL HIGH (ref 65–99)
Potassium: 3.4 mmol/L — ABNORMAL LOW (ref 3.5–5.1)
SODIUM: 143 mmol/L (ref 135–145)
Total Protein: 8.1 g/dL (ref 6.5–8.1)

## 2014-09-05 LAB — CBC WITH DIFFERENTIAL/PLATELET
Basophils Absolute: 0.1 10*3/uL (ref 0–0.1)
Basophils Relative: 1 %
EOS ABS: 0 10*3/uL (ref 0–0.7)
EOS PCT: 0 %
HCT: 49.1 % — ABNORMAL HIGH (ref 35.0–47.0)
Hemoglobin: 16.1 g/dL — ABNORMAL HIGH (ref 12.0–16.0)
Lymphocytes Relative: 26 %
Lymphs Abs: 2.3 10*3/uL (ref 1.0–3.6)
MCH: 28.3 pg (ref 26.0–34.0)
MCHC: 32.8 g/dL (ref 32.0–36.0)
MCV: 86.4 fL (ref 80.0–100.0)
MONO ABS: 1.1 10*3/uL — AB (ref 0.2–0.9)
Monocytes Relative: 13 %
NEUTROS ABS: 5.5 10*3/uL (ref 1.4–6.5)
Neutrophils Relative %: 60 %
Platelets: 280 10*3/uL (ref 150–440)
RBC: 5.68 MIL/uL — ABNORMAL HIGH (ref 3.80–5.20)
RDW: 17.2 % — AB (ref 11.5–14.5)
WBC: 9.1 10*3/uL (ref 3.6–11.0)

## 2014-09-05 LAB — LIPASE, BLOOD: LIPASE: 47 U/L (ref 22–51)

## 2014-09-05 MED ORDER — CARVEDILOL 25 MG PO TABS
ORAL_TABLET | ORAL | Status: AC
  Administered 2014-09-05: 25 mg
  Filled 2014-09-05: qty 1

## 2014-09-05 MED ORDER — ONDANSETRON HCL 4 MG/2ML IJ SOLN: 4.0000 mg | Freq: Four times a day (QID) | INTRAMUSCULAR | Status: DC | PRN

## 2014-09-05 MED ORDER — CLONIDINE HCL 0.1 MG PO TABS
0.3000 mg | ORAL_TABLET | Freq: Two times a day (BID) | ORAL | Status: DC
  Administered 2014-09-05 – 2014-09-07 (×4): 0.3 mg via ORAL
  Filled 2014-09-05 (×4): qty 3

## 2014-09-05 MED ORDER — HYDROCODONE-ACETAMINOPHEN 5-325 MG PO TABS
1.0000 | ORAL_TABLET | Freq: Four times a day (QID) | ORAL | Status: DC | PRN
  Administered 2014-09-06: 1 via ORAL
  Filled 2014-09-05: qty 1

## 2014-09-05 MED ORDER — LABETALOL HCL 5 MG/ML IV SOLN
INTRAVENOUS | Status: AC
  Administered 2014-09-05: 20 mg via INTRAVENOUS
  Filled 2014-09-05: qty 4

## 2014-09-05 MED ORDER — HYDROCHLOROTHIAZIDE 25 MG PO TABS
ORAL_TABLET | ORAL | Status: AC
Start: 2014-09-05 — End: 2014-09-05
  Administered 2014-09-05: 12.5 mg via ORAL
  Filled 2014-09-05: qty 1

## 2014-09-05 MED ORDER — CLONIDINE HCL 0.1 MG PO TABS
ORAL_TABLET | ORAL | Status: AC
  Administered 2014-09-05: 0.3 mg via ORAL
  Filled 2014-09-05: qty 3

## 2014-09-05 MED ORDER — CARVEDILOL 25 MG PO TABS
25.0000 mg | ORAL_TABLET | Freq: Two times a day (BID) | ORAL | Status: DC
  Administered 2014-09-06 – 2014-09-07 (×3): 25 mg via ORAL
  Filled 2014-09-05 (×3): qty 1

## 2014-09-05 MED ORDER — ACETAMINOPHEN 325 MG PO TABS
650.0000 mg | ORAL_TABLET | Freq: Four times a day (QID) | ORAL | Status: DC | PRN
  Administered 2014-09-07: 650 mg via ORAL
  Filled 2014-09-05: qty 2

## 2014-09-05 MED ORDER — ACETAMINOPHEN 650 MG RE SUPP: 650.0000 mg | Freq: Four times a day (QID) | RECTAL | Status: DC | PRN

## 2014-09-05 MED ORDER — SODIUM CHLORIDE 0.9 % IJ SOLN
3.0000 mL | Freq: Two times a day (BID) | INTRAMUSCULAR | Status: DC
  Administered 2014-09-05: 3 mL via INTRAVENOUS

## 2014-09-05 MED ORDER — PANTOPRAZOLE SODIUM 40 MG PO TBEC
40.0000 mg | DELAYED_RELEASE_TABLET | Freq: Two times a day (BID) | ORAL | Status: DC
  Administered 2014-09-05 – 2014-09-07 (×4): 40 mg via ORAL
  Filled 2014-09-05 (×4): qty 1

## 2014-09-05 MED ORDER — HEPARIN SODIUM (PORCINE) 5000 UNIT/ML IJ SOLN
5000.0000 [IU] | Freq: Three times a day (TID) | INTRAMUSCULAR | Status: DC
  Administered 2014-09-05 – 2014-09-06 (×3): 5000 [IU] via SUBCUTANEOUS
  Filled 2014-09-05 (×3): qty 1

## 2014-09-05 MED ORDER — PRAVASTATIN SODIUM 10 MG PO TABS
10.0000 mg | ORAL_TABLET | Freq: Every day | ORAL | Status: DC
  Administered 2014-09-05 – 2014-09-06 (×2): 10 mg via ORAL
  Filled 2014-09-05 (×2): qty 1

## 2014-09-05 MED ORDER — ONDANSETRON HCL 4 MG PO TABS: 4.0000 mg | ORAL_TABLET | Freq: Four times a day (QID) | ORAL | Status: DC | PRN

## 2014-09-05 MED ORDER — LABETALOL HCL 5 MG/ML IV SOLN
20.0000 mg | Freq: Once | INTRAVENOUS | Status: AC
  Administered 2014-09-05: 20 mg via INTRAVENOUS

## 2014-09-05 MED ORDER — GABAPENTIN 300 MG PO CAPS
300.0000 mg | ORAL_CAPSULE | Freq: Three times a day (TID) | ORAL | Status: DC
  Administered 2014-09-05 – 2014-09-07 (×5): 300 mg via ORAL
  Filled 2014-09-05 (×5): qty 1

## 2014-09-05 MED ORDER — ASPIRIN 81 MG PO CHEW
324.0000 mg | CHEWABLE_TABLET | Freq: Once | ORAL | Status: AC
  Administered 2014-09-05: 324 mg via ORAL

## 2014-09-05 MED ORDER — HYDROCHLOROTHIAZIDE 25 MG PO TABS
12.5000 mg | ORAL_TABLET | Freq: Once | ORAL | Status: AC
  Administered 2014-09-05: 12.5 mg via ORAL

## 2014-09-05 MED ORDER — SODIUM CHLORIDE 0.9 % IV SOLN
INTRAVENOUS | Status: DC
  Administered 2014-09-05 – 2014-09-07 (×3): via INTRAVENOUS

## 2014-09-05 MED ORDER — LEVOTHYROXINE SODIUM 50 MCG PO TABS
50.0000 ug | ORAL_TABLET | Freq: Every day | ORAL | Status: DC
  Administered 2014-09-06 – 2014-09-07 (×2): 50 ug via ORAL
  Filled 2014-09-05 (×2): qty 1

## 2014-09-05 MED ORDER — CLONIDINE HCL 0.1 MG PO TABS
0.3000 mg | ORAL_TABLET | Freq: Once | ORAL | Status: AC
  Administered 2014-09-05: 0.3 mg via ORAL

## 2014-09-05 MED ORDER — SODIUM CHLORIDE 0.9 % IV BOLUS (SEPSIS)
1000.0000 mL | Freq: Once | INTRAVENOUS | Status: AC
  Administered 2014-09-05: 1000 mL via INTRAVENOUS

## 2014-09-05 MED ORDER — ASPIRIN EC 81 MG PO TBEC
81.0000 mg | DELAYED_RELEASE_TABLET | Freq: Every day | ORAL | Status: DC
  Administered 2014-09-06 – 2014-09-07 (×2): 81 mg via ORAL
  Filled 2014-09-05 (×3): qty 1

## 2014-09-05 MED ORDER — CARVEDILOL 25 MG PO TABS
25.0000 mg | ORAL_TABLET | Freq: Two times a day (BID) | ORAL | Status: DC
  Administered 2014-09-05: 25 mg via ORAL

## 2014-09-05 MED ORDER — ASPIRIN 81 MG PO CHEW
CHEWABLE_TABLET | ORAL | Status: AC
  Administered 2014-09-05: 324 mg via ORAL
  Filled 2014-09-05: qty 4

## 2014-09-05 NOTE — ED Notes (Signed)
Blood pressure elevated.  md aware.

## 2014-09-05 NOTE — H&P (Signed)
[facility] [place] [facility]   PATIENT NAME: Kimberly LeschesLeatha Donovan    MR#:  [id number]  DATE OF BIRTH:  07/01/1955  DATE OF ADMISSION:  09/05/2014  PRIMARY CARE PHYSICIAN: No PCP Per Patient   REQUESTING/REFERRING PHYSICIAN: Lord  CHIEF COMPLAINT:   Chief Complaint  Patient presents with  . Abdominal Pain    HISTORY OF PRESENT ILLNESS:  Kimberly LeschesLeatha Dimarco  is a 59 y.o. female who presents with nausea, vomiting, and diarrhea for the past 4 days. She denies any associated abdominal pain, and denies any blood in her emesis or stool. She denies fevers or chills or other infectious symptoms. Her persistent nausea and vomiting kept her from taking her BP meds. On evaluation in the ED she was found to have accelerated hypertension and elevated troponin, as well as mild AKI.  Hospitalists were called for admission for the elevated troponin, an accelerated hypertension.  PAST MEDICAL HISTORY:   Past Medical History  Diagnosis Date  . Stroke     may 2015  . Hypertension   . Thyroid disease   . H/O blood clots   . Anemia   . AVM (arteriovenous malformation)   . Anemia   . Renal failure   . PVD (peripheral vascular disease)   . Hyperkalemia   . Carotid artery stenosis     PAST SURGICAL HISTORY:   Past Surgical History  Procedure Laterality Date  . Left foot surgery  02/2014    SOCIAL HISTORY:   History  Substance Use Topics  . Smoking status: Current Every Day Smoker -- 1.00 packs/day for 20 years  . Smokeless tobacco: Not on file  . Alcohol Use: No    FAMILY HISTORY:   Family History  Problem Relation Age of Onset  . Diabetes Mother   . Hypertension Mother   . Hypertension Sister   . Thyroid disease Sister     DRUG ALLERGIES:  No Known Allergies  MEDICATIONS AT HOME:   Prior to Admission medications   Medication Sig Start Date End Date Taking? Authorizing Provider  amLODipine (NORVASC) 10 MG tablet Take 10 mg by mouth daily.   Yes  Historical Provider, MD  aspirin EC 81 MG tablet Take 81 mg by mouth daily.   Yes Historical Provider, MD  carvedilol (COREG) 25 MG tablet Take 25 mg by mouth 2 (two) times daily with a meal.   Yes Historical Provider, MD  cloNIDine (CATAPRES) 0.3 MG tablet Take 0.3 mg by mouth 2 (two) times daily.   Yes Historical Provider, MD  docusate sodium (COLACE) 100 MG capsule Take 100 mg by mouth 2 (two) times daily.   Yes Historical Provider, MD  ferrous sulfate 325 (65 FE) MG tablet Take 325 mg by mouth daily with breakfast.   Yes Historical Provider, MD  gabapentin (NEURONTIN) 300 MG capsule Take 300 mg by mouth 3 (three) times daily.   Yes Historical Provider, MD  hydrochlorothiazide (MICROZIDE) 12.5 MG capsule Take 12.5 mg by mouth daily.   Yes Historical Provider, MD  HYDROcodone-acetaminophen (NORCO) 10-325 MG per tablet Take 0.5-1 tablets by mouth every 6 (six) hours as needed for severe pain.   Yes Historical Provider, MD  levothyroxine (SYNTHROID, LEVOTHROID) 50 MCG tablet Take 50 mcg by mouth daily.   Yes Historical Provider, MD  Multiple Vitamin (MULTIVITAMIN WITH MINERALS) TABS tablet Take 1 tablet by mouth daily.   Yes Historical Provider, MD  pantoprazole (PROTONIX) 40 MG tablet Take 40 mg by mouth 2 (two)  times daily.   Yes Historical Provider, MD  pravastatin (PRAVACHOL) 10 MG tablet Take 10 mg by mouth at bedtime.   Yes Historical Provider, MD  sucralfate (CARAFATE) 1 G tablet Take 1 g by mouth 3 (three) times daily.   Yes Historical Provider, MD  ondansetron (ZOFRAN) 4 MG tablet Take 1 tablet (4 mg total) by mouth every 8 (eight) hours as needed for nausea or vomiting. 09/02/14 09/02/15  Phineas Semen, MD    REVIEW OF SYSTEMS:  Review of Systems  Constitutional: Negative for fever, chills, weight loss and malaise/fatigue.  HENT: Negative for ear pain, hearing loss and tinnitus.   Eyes: Negative for blurred vision, double vision, pain and redness.  Respiratory: Negative for cough,  hemoptysis and shortness of breath.   Cardiovascular: Negative for chest pain, palpitations, orthopnea and leg swelling.  Gastrointestinal: Positive for nausea, vomiting, abdominal pain and diarrhea. Negative for constipation.  Genitourinary: Negative for dysuria, frequency and hematuria.  Skin:       No acne, rash, or lesions  Neurological: Negative for dizziness, tremors, focal weakness and weakness.  Endo/Heme/Allergies: Negative for polydipsia. Does not bruise/bleed easily.  Psychiatric/Behavioral: Negative for depression. The patient is not nervous/anxious and does not have insomnia.      VITAL SIGNS:   Filed Vitals:   09/05/14 1922 09/05/14 2000 09/05/14 2030 09/05/14 2100  BP: 200/104 190/105 159/94 149/94  Pulse: 95 102 75 81  Temp:      TempSrc:      Resp: 20     Height:      Weight:      SpO2:  99% 97% 99%   Wt Readings from Last 3 Encounters:  09/05/14 73.029 kg (161 lb)  09/02/14 81.647 kg (180 lb)    PHYSICAL EXAMINATION:  Physical Exam  Constitutional: She is oriented to person, place, and time. She appears well-developed and well-nourished. No distress.  HENT:  Head: Normocephalic and atraumatic.  Mouth/Throat: Oropharynx is clear and moist.  Eyes: Conjunctivae and EOM are normal. Pupils are equal, round, and reactive to light. Right eye exhibits no discharge. Left eye exhibits no discharge. No scleral icterus.  Neck: Normal range of motion. Neck supple. No JVD present. No thyromegaly present.  Cardiovascular: Normal rate, regular rhythm and intact distal pulses.  Exam reveals no gallop and no friction rub.   No murmur heard. Respiratory: Effort normal and breath sounds normal. No respiratory distress. She has no wheezes. She has no rales.  GI: Soft. Bowel sounds are normal. She exhibits distension. There is no tenderness.  Musculoskeletal: Normal range of motion. She exhibits no edema.  No arthritis, no gout  Lymphadenopathy:    She has no cervical  adenopathy.  Neurological: She is alert and oriented to person, place, and time. No cranial nerve deficit.  No dysarthria, no aphasia  Skin: Skin is warm and dry. No rash noted. No erythema.  Psychiatric: She has a normal mood and affect. Her behavior is normal. Judgment and thought content normal.  Patient is very sleepy on exam, but answers appropriately.    LABORATORY PANEL:   CBC  Recent Labs Lab 09/05/14 1205  WBC 9.1  HGB 16.1*  HCT 49.1*  PLT 280   ------------------------------------------------------------------------------------------------------------------  Chemistries   Recent Labs Lab 09/05/14 1205  NA 143  K 3.4*  CL 105  CO2 26  GLUCOSE 111*  BUN 34*  CREATININE 1.25*  CALCIUM 9.4  AST 128*  ALT 103*  ALKPHOS 77  BILITOT 1.6*   ------------------------------------------------------------------------------------------------------------------  Cardiac Enzymes No results for input(s): TROPONINI in the last 168 hours. ------------------------------------------------------------------------------------------------------------------  RADIOLOGY:  Dg Abd 2 Views  09/05/2014   CLINICAL DATA:  Nausea and vomiting  EXAM: ABDOMEN - 2 VIEW  COMPARISON:  09/02/2014  FINDINGS: Scattered large and small bowel gas is noted. No abnormal mass or abnormal calcifications are seen. Calcified uterine fibroid is again noted in the left hemipelvis. No free air is seen. No bony abnormality is noted.  IMPRESSION: No acute abnormality seen.   Electronically Signed   By: Alcide CleverMark  Lukens M.D.   On: 09/05/2014 15:39   [place] Abdomen Limited Ruq  09/05/2014   CLINICAL DATA:  Abdominal pain with nausea and vomiting.  EXAM: US ABDOMEN LIMITED - RIGHT UPPER QUADRANT  COMPARISON:  None.  FINDINGS: Gallbladder:  No gallstones or wall thickening visualized. No sonographic Murphy sign noted.  Common bile duct:  Diameter: Normal, 2 mm.  Liver:  No focal lesion identified. Within normal limits in  parenchymal echogenicity.  IMPRESSION: No acute process or explanation for right upper quadrant pain. Normal right upper quadrant ultrasound.   Electronically Signed   By: Jeronimo GreavesKyle  Talbot M.D.   On: 09/05/2014 16:51    EKG:   Orders placed or performed during the hospital encounter of 09/05/14  . ED EKG  . ED EKG    IMPRESSION AND PLAN:  Principal Problem:   Accelerated hypertension - mild AK I and elevated troponin. BP initially in the 200s systolic, and eventually came down in the ED after administration of IV antihypertensive. We will continue to monitor this here with blood pressure goal over the first 24 8 hours of less than 180 systolic. Continue home antihypertensives, and add IV antihypertensives as needed Active Problems:   Elevated troponin - mildly elevated, possibly due to significantly elevated blood pressure. Trend cardiac enzymes. If enzymes increase significantly, call cardiology and start heparin drip per ACS protocol.   AKI (acute kidney injury) - likely secondary to her accelerated hypertension. We will control her blood pressure, give her very gentle fluids, and monitor her creatinine in the morning   Gastritis without bleeding - unclear why this is such a persistent duration. If the patient does not improve soon she may need further workup as to the etiology of her nausea and vomiting.   Hypothyroidism - chronic stable problem, continue home meds. Check TSH.  All the records are reviewed and case discussed with ED provider. Management plans discussed with the patient and/or family.  DVT PROPHYLAXIS: SubQ heparin CODE STATUS: Full  TOTAL TIME TAKING CARE OF THIS PATIENT: 50 minutes.    Ghassan Coggeshall FIELDING 09/05/2014, 9:23 PM  [facility]  Office  [phone]  CC: Primary care physician; No PCP Per Patient

## 2014-09-05 NOTE — ED Notes (Signed)
Pt has nausea and vomiting.  Pt seen [facility] and sent to er for eval.  Pt has hypertension.  No meds past few days because of n/v.  Pt denies abd pain.  No chest pain or sob.  Pt alert.  Pt also seen in er 5 days ago with similar sx.  States no better.  Pt lives with sister who is with pt now.

## 2014-09-05 NOTE — ED Notes (Signed)
Patient transported to Ultrasound 

## 2014-09-05 NOTE — ED Notes (Signed)
Pt does have active leg wound which she is being followed and scheduled for surgery.

## 2014-09-05 NOTE — ED Notes (Signed)
Pt sleeping. 

## 2014-09-05 NOTE — ED Notes (Signed)
Family reports patient has been having n/v/d since Saturday.  Reports n/v/d stopped yesterday.  Was seen [facility] and wanted her to be evaluated Pt lips are dry.

## 2014-09-05 NOTE — ED Notes (Signed)
Blood pressure improved

## 2014-09-05 NOTE — ED Provider Notes (Deleted)
Medical screening examination/treatment/procedure(s) were performed by non-physician practitioner and as supervising physician I was immediately available for consultation/collaboration.    Governor Rooksebecca Shareka Casale, MD 09/05/14 [phone]

## 2014-09-05 NOTE — ED Provider Notes (Signed)
[facility] Emergency Department Provider Note   ____________________________________________  Time seen:   I have reviewed the triage vital signs and the nursing notes.   HISTORY  Chief Complaint Abdominal Pain    HPI Kimberly Mccarthy is a 59 y.o. female who is presenting with spitting up and not feeling well for several days. She did have diarrhea this weekend although none now. Last BM yesterday. No fevers however she has had chills. She is having mild cramping and right upper quadrant abdominal pain. Symptoms are moderate. She hasn't taken her blood pressure pills and 4 days due to vomiting/spitting up. There is no black or bloody stools there is no chest pain there is no trouble breathing      Past Medical History  Diagnosis Date  . Stroke     may 2015  . Hypertension   . Thyroid disease   . H/O blood clots   . Anemia   . AVM (arteriovenous malformation)   . Anemia   . Renal failure   . PVD (peripheral vascular disease)   . Hyperkalemia   . Carotid artery stenosis     There are no active problems to display for this patient.   Past Surgical History  Procedure Laterality Date  . Left foot surgery  02/2014    Current Outpatient Rx  Name  Route  Sig  Dispense  Refill  . amLODipine (NORVASC) 10 MG tablet   Oral   Take 10 mg by mouth daily.         Marland Kitchen. aspirin EC 81 MG tablet   Oral   Take 81 mg by mouth daily.         . carvedilol (COREG) 25 MG tablet   Oral   Take 25 mg by mouth 2 (two) times daily with a meal.         . cloNIDine (CATAPRES) 0.3 MG tablet   Oral   Take 0.3 mg by mouth 2 (two) times daily.         Marland Kitchen. docusate sodium (COLACE) 100 MG capsule   Oral   Take 100 mg by mouth 2 (two) times daily.         . ferrous sulfate 325 (65 FE) MG tablet   Oral   Take 325 mg by mouth daily with breakfast.         . gabapentin (NEURONTIN) 300 MG capsule   Oral   Take 300 mg by mouth 3 (three) times daily.          . hydrochlorothiazide (MICROZIDE) 12.5 MG capsule   Oral   Take 12.5 mg by mouth daily.         Marland Kitchen. HYDROcodone-acetaminophen (NORCO) 10-325 MG per tablet   Oral   Take 0.5-1 tablets by mouth every 6 (six) hours as needed for severe pain.         Marland Kitchen. levothyroxine (SYNTHROID, LEVOTHROID) 50 MCG tablet   Oral   Take 50 mcg by mouth daily.         . Multiple Vitamin (MULTIVITAMIN WITH MINERALS) TABS tablet   Oral   Take 1 tablet by mouth daily.         . pantoprazole (PROTONIX) 40 MG tablet   Oral   Take 40 mg by mouth 2 (two) times daily.         . pravastatin (PRAVACHOL) 10 MG tablet   Oral   Take 10 mg by mouth at bedtime.         .Marland Kitchen  sucralfate (CARAFATE) 1 G tablet   Oral   Take 1 g by mouth 3 (three) times daily.         . ondansetron (ZOFRAN) 4 MG tablet   Oral   Take 1 tablet (4 mg total) by mouth every 8 (eight) hours as needed for nausea or vomiting.   30 tablet   1     Allergies Review of patient's allergies indicates no known allergies.  Family History  Problem Relation Age of Onset  . Diabetes Mother   . Hypertension Mother   . Hypertension Sister   . Thyroid disease Sister     Social History History  Substance Use Topics  . Smoking status: Current Every Day Smoker -- 1.00 packs/day for 20 years  . Smokeless tobacco: Not on file  . Alcohol Use: No    Review of Systems  Constitutional: Negative for fever. Eyes: Negative for visual changes. ENT: Negative for sore throat. Cardiovascular: Negative for chest pain. Respiratory: Negative for shortness of breath. Gastrointestinal: Positive vomiting diarrhea and abdominal pain Genitourinary: Negative for dysuria. Musculoskeletal: Negative for back pain. Skin: Negative for rash. Neurological: Negative for headaches, focal weakness or numbness.   10-point ROS otherwise negative.  ____________________________________________   PHYSICAL EXAM:  VITAL SIGNS: ED Triage Vitals  Enc  Vitals Group     BP 09/05/14 1155 189/120 mmHg     Pulse Rate 09/05/14 1155 121     Resp 09/05/14 1155 20     Temp 09/05/14 1155 98.2 F (36.8 C)     Temp Source 09/05/14 1510 Oral     SpO2 09/05/14 1155 97 %     Weight 09/05/14 1155 161 lb (73.029 kg)     Height 09/05/14 1155 5\' 7"  (1.702 m)     Head Cir --      Peak Flow --      Pain Score --      Pain Loc --      Pain Edu? --      Excl. in GC? --      Constitutional: Alert and oriented. Well appearing and in no distress. Eyes: Conjunctivae are normal. PERRL. Normal extraocular movements. ENT   Head: Normocephalic and atraumatic.   Nose: No congestion/rhinnorhea.   Mouth/Throat: Mucous membranes are moist. Patient is spitting up some clear spit.   Neck: No stridor. Cardiovascular: Normal rate, regular rhythm.  No murmurs, rubs, or gallops. Respiratory: Normal respiratory effort without tachypnea nor retractions. Breath sounds are clear and equal bilaterally. No wheezes/rales/rhonchi. Gastrointestinal: Soft and with right upper quadrant tenderness. No distention no guarding no rebound Genitourinary:  Musculoskeletal: Nontender with normal range of motion in all extremities. No joint effusions.  No lower extremity tenderness nor edema. Neurologic:  Normal speech and language. No gross focal neurologic deficits are appreciated. Speech is normal. No gait instability. Skin:  Skin is warm, dry and intact. No rash noted. Psychiatric: Mood and affect are normal. Speech and behavior are normal. Patient exhibits appropriate insight and judgment.  ____________________________________________   EKG  120 bpm. Sinus tachycardia with PVC. Normal axis. Narrow QRS. Nonspecific ST and T-wave.  ____________________________________________   LABS (pertinent positives/negatives)  Urinalysis with trace ketones 1+ bilirubin negative for RBC WBC rare bacteria Lipase negative BUN 34 and creatinine 1.25 with relatively normal  electrolytes AST 128 ailed T103 bili 1.6 1.0, normal at 9.1 and hemoglobin 16.1 Troponin 0.12 ____________________________________________    RADIOLOGY Reviewed abdomen demand 2 view result: No acute abnormality. Ultrasound result was reviewed:  No acute abnormality   ____________________________________________   PROCEDURES  Procedure(s) performed: No Critical Care performed:  No  ____________________________________________   INITIAL IMPRESSION / ASSESSMENT AND PLAN / ED COURSE  Pertinent labs & imaging results that were a vailable during my care of the patient were reviewed by me and considered in my medical decision making (see chart for details).  Initially felt to be viral vomiting and diarrhea with some dehydration however EKG does show some ST-T wave findings inferior laterally and her troponin did come back elevated 0.12. She was given aspirin. She's having no active chest pain I will defer any additional antibiotic relation to the hospitalist. Hospitalist consulted for admission. Patient was given IV labetalol for elevated blood pressures 200 over 100s and resultant blood pressure 159 systolic. I do suspect the elevated troponin is likely strain related to hypertensive urgency/emergency.  ____________________________________________   FINAL CLINICAL IMPRESSION(S) / ED DIAGNOSES Mild acute renal failure Acute viral illness vomiting and diarrhea Hypertensive emergency    Governor Rooks, MD 09/05/14 2057

## 2014-09-06 LAB — CBC
HEMATOCRIT: 39.1 % (ref 35.0–47.0)
HEMATOCRIT: 43.2 % (ref 35.0–47.0)
HEMOGLOBIN: 14 g/dL (ref 12.0–16.0)
Hemoglobin: 12.9 g/dL (ref 12.0–16.0)
MCH: 28.2 pg (ref 26.0–34.0)
MCH: 28.8 pg (ref 26.0–34.0)
MCHC: 32.5 g/dL (ref 32.0–36.0)
MCHC: 33 g/dL (ref 32.0–36.0)
MCV: 86.8 fL (ref 80.0–100.0)
MCV: 87.3 fL (ref 80.0–100.0)
PLATELETS: 229 10*3/uL (ref 150–440)
Platelets: 199 10*3/uL (ref 150–440)
RBC: 4.48 MIL/uL (ref 3.80–5.20)
RBC: 4.97 MIL/uL (ref 3.80–5.20)
RDW: 16.6 % — AB (ref 11.5–14.5)
RDW: 16.6 % — ABNORMAL HIGH (ref 11.5–14.5)
WBC: 7.8 10*3/uL (ref 3.6–11.0)
WBC: 8 10*3/uL (ref 3.6–11.0)

## 2014-09-06 LAB — BASIC METABOLIC PANEL
ANION GAP: 8 (ref 5–15)
BUN: 37 mg/dL — ABNORMAL HIGH (ref 6–20)
CALCIUM: 8.1 mg/dL — AB (ref 8.9–10.3)
CO2: 25 mmol/L (ref 22–32)
Chloride: 110 mmol/L (ref 101–111)
Creatinine, Ser: 1.12 mg/dL — ABNORMAL HIGH (ref 0.44–1.00)
GFR calc non Af Amer: 53 mL/min — ABNORMAL LOW (ref >60)
Glucose, Bld: 94 mg/dL (ref 65–99)
Potassium: 3.1 mmol/L — ABNORMAL LOW (ref 3.5–5.1)
Sodium: 143 mmol/L (ref 135–145)

## 2014-09-06 LAB — TROPONIN I
TROPONIN I: 0.05 ng/mL — AB (ref <0.031)
TROPONIN I: 0.06 ng/mL — AB (ref <0.031)
TROPONIN I: 0.07 ng/mL — AB (ref <0.031)
Troponin I: 0.12 ng/mL — ABNORMAL HIGH (ref <0.031)

## 2014-09-06 LAB — CREATININE, SERUM
Creatinine, Ser: 1.32 mg/dL — ABNORMAL HIGH (ref 0.44–1.00)
GFR calc Af Amer: 50 mL/min — ABNORMAL LOW (ref >60)
GFR calc non Af Amer: 44 mL/min — ABNORMAL LOW (ref >60)

## 2014-09-06 LAB — TSH: TSH: 1.59 u[IU]/mL (ref 0.350–4.500)

## 2014-09-06 MED ORDER — POTASSIUM CHLORIDE 20 MEQ PO PACK
40.0000 meq | PACK | Freq: Once | ORAL | Status: AC
  Administered 2014-09-06: 40 meq via ORAL
  Filled 2014-09-06: qty 2

## 2014-09-06 MED ORDER — ENOXAPARIN SODIUM 40 MG/0.4ML ~~LOC~~ SOLN
40.0000 mg | SUBCUTANEOUS | Status: DC
  Administered 2014-09-06: 40 mg via SUBCUTANEOUS
  Filled 2014-09-06: qty 0.4

## 2014-09-06 MED ORDER — DOCUSATE SODIUM 100 MG PO CAPS
100.0000 mg | ORAL_CAPSULE | Freq: Two times a day (BID) | ORAL | Status: DC
  Administered 2014-09-06 – 2014-09-07 (×3): 100 mg via ORAL
  Filled 2014-09-06 (×3): qty 1

## 2014-09-06 MED ORDER — HYDRALAZINE HCL 25 MG PO TABS
25.0000 mg | ORAL_TABLET | Freq: Three times a day (TID) | ORAL | Status: DC
  Administered 2014-09-06 – 2014-09-07 (×3): 25 mg via ORAL
  Filled 2014-09-06 (×3): qty 1

## 2014-09-06 MED ORDER — HYDROCODONE-ACETAMINOPHEN 5-325 MG PO TABS
1.0000 | ORAL_TABLET | ORAL | Status: DC | PRN
Start: 2014-09-06 — End: 2014-09-07
  Administered 2014-09-06 – 2014-09-07 (×3): 1 via ORAL
  Filled 2014-09-06 (×3): qty 1

## 2014-09-06 NOTE — Evaluation (Signed)
Physical Therapy Evaluation Patient Details Name: Kimberly Mccarthy MRN: [id number] DOB: 1956-03-09 Today's Date: 09/06/2014   History of Present Illness  presented to ER with persistent nausea/vomiting, diarrhea x4 days; admitted with acute gastritis, accelerated HTN, acute kidney injury.  Mild elevation in troponin noted, trending down, not felt to be ACS per notes.  Clinical Impression  Upon evaluation, patient alert and oriented to all information.  Initially declined evaluation, but agreeable to OOB to chair.  Demonstrates strength and ROM grossly WFL for basic transfers/mobility (not tested with resistance secondary to pain).  Slow and guarded with all movement transitions, but able to complete with minimal assist from therapist.  Currently mod indep for bed mobility; cga/close sup for sit/stand, basic transfers and gait from bed/chair (with RW).  Heavy WBing bilat UEs for pain relief to bilat LEs. Would benefit from skilled PT to address above deficits and promote optimal return to PLOF;Recommend transition to HHPT upon discharge from acute hospitalization. Patient aware of recommendations and in agreement with plan.     Follow Up Recommendations Home health PT    Equipment Recommendations       Recommendations for Other Services       Precautions / Restrictions Precautions Precautions: Fall Restrictions Weight Bearing Restrictions: No      Mobility  Bed Mobility Overal bed mobility: Independent                Transfers Overall transfer level: Needs assistance Equipment used: Rolling walker (2 wheeled) Transfers: Sit to/from Stand Sit to Stand: Supervision            Ambulation/Gait Ambulation/Gait assistance: Supervision Ambulation Distance (Feet): 5 Feet         General Gait Details: short steps with mod WBing bilat UEs; slow and deliberate, no overt LOB.  Patient refused additional distance secondary to fatigue.  Stairs            Wheelchair  Mobility    Modified Rankin (Stroke Patients Only)       Balance Overall balance assessment: Needs assistance Sitting-balance support: No upper extremity supported Sitting balance-Leahy Scale: Normal     Standing balance support: Bilateral upper extremity supported Standing balance-Leahy Scale: Fair                               Pertinent Vitals/Pain Pain Assessment: 0-10 Pain Score: 10-Worst pain ever Pain Location: R LE Pain Descriptors / Indicators: Aching Pain Intervention(s): Limited activity within patient's tolerance;Monitored during session;Repositioned;RN gave pain meds during session    Home Living Family/patient expects to be discharged to:: Private residence Living Arrangements: Other relatives (sister, works outside of the home) Available Help at Discharge: Family Type of Home: House Home Access: Stairs to enter Entrance Stairs-Rails: Right Entrance Stairs-Number of Steps: 2 Home Layout: One level (2 steps within the home) Home Equipment: Walker - 4 wheels      Prior Function Level of Independence: Independent with assistive device(s)         Comments: 4WRW for household and limited community mobility; indep wtih ADLs.  Denies recent fall history.     Hand Dominance        Extremity/Trunk Assessment   Upper Extremity Assessment: Overall WFL for tasks assessed           Lower Extremity Assessment: Overall WFL for tasks assessed (movement very slow and guarded, strength at least 3+ to 4-/5; not tested with resistance secondary to pain)  Communication   Communication: No difficulties  Cognition Arousal/Alertness: Awake/alert Behavior During Therapy: WFL for tasks assessed/performed Overall Cognitive Status: Within Functional Limits for tasks assessed                      General Comments      Exercises        Assessment/Plan    PT Assessment Patient needs continued PT services  PT Diagnosis  Difficulty walking;Generalized weakness   PT Problem List Decreased strength;Decreased activity tolerance;Decreased mobility;Pain  PT Treatment Interventions DME instruction;Gait training;Functional mobility training;Therapeutic exercise;Patient/family education   PT Goals (Current goals can be found in the Care Plan section) Acute Rehab PT Goals Patient Stated Goal: to return home PT Goal Formulation: With patient Time For Goal Achievement: 09/20/14 Potential to Achieve Goals: Good Additional Goals Additional Goal #1: Basic transfers with LRAD, mod indep, for access to all seating surfaces in home environment. Additional Goal #2: Patient to mobilize 65>150' with LRAD, mod indep, for household mobilization upon discharge.    Frequency Min 2X/week   Barriers to discharge        Co-evaluation               End of Session Equipment Utilized During Treatment: Gait belt Activity Tolerance: Patient tolerated treatment well;Patient limited by pain Patient left: in bed;with call bell/phone within reach;with chair alarm set Nurse Communication: Mobility status         Time: [id number] PT Time Calculation (min) (ACUTE ONLY): 21 min   Charges:   PT Evaluation $Initial PT Evaluation Tier I: 1 Procedure     PT G Codes:       Layney Gillson H. Manson PasseyBrown, PT, DPT 09/06/2014, 4:04 PM [phone]

## 2014-09-06 NOTE — Progress Notes (Addendum)
Patient was admitted from ED d/t elevated blood pressure. Patient admission documentation was completed and was oriented to her room. Patient's meds were admitted per order, and blood pressure rechecked  Showed an improvement. Patient denied pain and N&V on admission. She rested well for most of the night w/o any distress. Patient was kept NPO per order. Will continue to monitor. Patient was offered bathroom with rounding, and her needed items were placed within reach.

## 2014-09-06 NOTE — Care Management (Signed)
Physical therapy is recommending that patient will benefit from home heatlh physical therapy.  Patient does not have an acute condition that is covered under medicaid.  Patient is agreeable to referral to [facility].  Provided her with brochure for [facility] and CM will initiate the referral

## 2014-09-06 NOTE — Progress Notes (Signed)
Initial Nutrition Assessment  DOCUMENTATION CODES:   INTERVENTION: Meals and Snacks: Cater to patient preferences  NUTRITION DIAGNOSIS:  Inadequate oral intake related to acute illness as evidenced by per patient/family report, percent weight loss and pt NPO status  GOAL: Energy Intake: Patient will meet greater than or equal to 90% of their needs  MONITOR:  PO intake, Labs, Weight trends  REASON FOR ASSESSMENT:  Malnutrition Screening Tool    ASSESSMENT: Reason For Admission: accelerated hypertension PMHx: Stroke, HTN, Renal Failure Typical Fluid/ Food Intake: pt NPO Meal/ Snack Patterns: Pt reports eating a regular diet with no restrictions PTA. Reports a good appetite and intake except for "last couple of days". Denies difficulty chewing/ swallowing, does not follow a special diet at home. Supplements: None  Labs:  Electrolyte and Renal Profile:    Recent Labs Lab 09/02/14 1646 09/05/14 1205 09/05/14 2354 09/06/14 0625  BUN 16 34*  --  37*  CREATININE 0.96 1.25* 1.32* 1.12*  NA 141 143  --  143  K 3.5 3.4*  --  3.1*   Protein Profile:  Recent Labs Lab 09/02/14 1646 09/05/14 1205  ALBUMIN 4.3 4.3    Meds: Pravachol, NS @ 75/ hr  UOP: REviewed  Physical Findings: n/a Weight Changes: pt reports having lost weight. States that about a 1 year ago she was 208#. Reviewed of medical records from 6 months and 2 year ago reveal some weight changes. Wt x May 2014= 176. Weight x Nov 2015= 183.1. Current weight represents a 12.6% decrease x 6 months, which is significant.   Height:  Ht Readings from Last 1 Encounters:  09/05/14 5\' 7"  (1.702 m)    Weight:  Wt Readings from Last 1 Encounters:  09/06/14 160 lb 9.6 oz (72.848 kg)    Ideal Body Weight:   Wt Readings from Last 10 Encounters:  09/06/14 160 lb 9.6 oz (72.848 kg)  09/02/14 180 lb (81.647 kg)    BMI:  Body mass index is 25.15 kg/(m^2).  Skin:  Reviewed, no issues  Diet Order:  Diet NPO  time specified Except for: Sips with Meds  EDUCATION NEEDS:  No education needs identified at this time   Intake/Output Summary (Last 24 hours) at 09/06/14 1042 Last data filed at 09/06/14 0600  Gross per 24 hour  Intake 458.75 ml  Output      0 ml  Net 458.75 ml    Last BM:  5/10  Kimberly Mccarthy, RDN Pager: [phone] Office: [phone]  LOW Care Level

## 2014-09-06 NOTE — Progress Notes (Signed)
[facility]  at [facility]   PATIENT NAME: Kimberly LeschesLeatha Mccarthy    MR#:  [id number]  DATE OF BIRTH:  Sep 13, 1955  SUBJECTIVE:  CHIEF COMPLAINT:  Patient is resting comfortably. When we will abdominal pain. Requesting to increase the pain medication. Denies any nausea or vomiting. Tolerating clear liquids  REVIEW OF SYSTEMS:  CONSTITUTIONAL: No fever, fatigue or weakness.  EYES: No blurred or double vision.  EARS, NOSE, AND THROAT: No tinnitus or ear pain.  RESPIRATORY: No cough, shortness of breath, wheezing or hemoptysis.  CARDIOVASCULAR: No chest pain, orthopnea, edema.  GASTROINTESTINAL: No nausea, vomiting, diarrhea or some generalized abdominal pain.  GENITOURINARY: No dysuria, hematuria.  ENDOCRINE: No polyuria, nocturia,  HEMATOLOGY: No anemia, easy bruising or bleeding SKIN: No rash or lesion. MUSCULOSKELETAL: No joint pain or arthritis.   NEUROLOGIC: No tingling, numbness, weakness.  PSYCHIATRY: No anxiety or depression.   DRUG ALLERGIES:  No Known Allergies  VITALS:  Blood pressure 143/72, pulse 63, temperature 98.3 F (36.8 C), temperature source Oral, resp. rate 18, height 5\' 7"  (1.702 m), weight 72.848 kg (160 lb 9.6 oz), SpO2 100 %.  PHYSICAL EXAMINATION:  GENERAL:  59 y.o.-year-old patient lying in the bed with no acute distress.  EYES: Pupils equal, round, reactive to light and accommodation. No scleral icterus. Extraocular muscles intact.  HEENT: Head atraumatic, normocephalic. Oropharynx and nasopharynx clear.  NECK:  Supple, no jugular venous distention. No thyroid enlargement, no tenderness.  LUNGS: Normal breath sounds bilaterally, no wheezing, rales,rhonchi or crepitation. No use of accessory muscles of respiration.  CARDIOVASCULAR: S1, S2 normal. No murmurs, rubs, or gallops.  ABDOMEN: Soft, some generalized discomfort but nontender, nondistended. Bowel sounds present. No organomegaly or mass.  EXTREMITIES: No pedal edema,  cyanosis, or clubbing.  NEUROLOGIC: Cranial nerves II through XII are intact. Muscle strength 5/5 in all extremities. Sensation intact. Gait not checked.  PSYCHIATRIC: The patient is alert and oriented x 3.  SKIN: No obvious rash, lesion, or ulcer.    LABORATORY PANEL:   CBC  Recent Labs Lab 09/06/14 0625  WBC 7.8  HGB 12.9  HCT 39.1  PLT 199   ------------------------------------------------------------------------------------------------------------------  Chemistries   Recent Labs Lab 09/05/14 1205  09/06/14 0625  NA 143  --  143  K 3.4*  --  3.1*  CL 105  --  110  CO2 26  --  25  GLUCOSE 111*  --  94  BUN 34*  --  37*  CREATININE 1.25*  < > 1.12*  CALCIUM 9.4  --  8.1*  AST 128*  --   --   ALT 103*  --   --   ALKPHOS 77  --   --   BILITOT 1.6*  --   --   < > = values in this interval not displayed. ------------------------------------------------------------------------------------------------------------------  Cardiac Enzymes  Recent Labs Lab 09/06/14 0625  TROPONINI 0.06*   ------------------------------------------------------------------------------------------------------------------  RADIOLOGY:  Dg Abd 2 Views  09/05/2014   CLINICAL DATA:  Nausea and vomiting  EXAM: ABDOMEN - 2 VIEW  COMPARISON:  09/02/2014  FINDINGS: Scattered large and small bowel gas is noted. No abnormal mass or abnormal calcifications are seen. Calcified uterine fibroid is again noted in the left hemipelvis. No free air is seen. No bony abnormality is noted.  IMPRESSION: No acute abnormality seen.   Electronically Signed   By: Alcide CleverMark  Lukens M.D.   On: 09/05/2014 15:39   [place] Abdomen Limited Ruq  09/05/2014   CLINICAL DATA:  Abdominal pain with nausea and vomiting.  EXAM: US ABDOMEN LIMITED - RIGHT UPPER QUADRANT  COMPARISON:  None.  FINDINGS: Gallbladder:  No gallstones or wall thickening visualized. No sonographic Murphy sign noted.  Common bile duct:  Diameter: Normal, 2 mm.   Liver:  No focal lesion identified. Within normal limits in parenchymal echogenicity.  IMPRESSION: No acute process or explanation for right upper quadrant pain. Normal right upper quadrant ultrasound.   Electronically Signed   By: Jeronimo GreavesKyle  Talbot M.D.   On: 09/05/2014 16:51    EKG:   Orders placed or performed during the hospital encounter of 09/05/14  . ED EKG  . ED EKG  . EKG 12-Lead  . EKG 12-Lead  . EKG 12-Lead  . EKG 12-Lead    ASSESSMENT AND PLAN:    #1 accelerated hypertension-blood pressure is better but still elevated. We will add hydralazine to the regimen. Will continue clonidine and the beta blocker. We'll uptitrate as needed. Patient will get IV antihypertensives as needed basis.  #2 elevated troponin-patient is denying any chest pain at this time, will trend cardiac enzymes.  3 acute gastritis-patient is clinically feeling better. Tolerating clear liquids. We will advance her diet to low sodium.  #4 acute kidney injury-renal function is improving with IV fluids. Will check BMP in a.m. Will avoid nephrotoxins.  #5 hypothyroidism- chronic in nature. Continue Synthroid. TSH is normal.    All the records are reviewed and case discussed with Care Management/Social Workerr. Management plans discussed with the patient, family and they are in agreement.  CODE STATUS: Full code  TOTAL TIME TAKING CARE OF THIS PATIENT: 35 minutes.   POSSIBLE D/C IN 1-2 DAYS, DEPENDING ON CLINICAL CONDITION.   Ramonita LabGouru, Murl Golladay M.D on 09/06/2014 at 12:16 PM  Between 7am to 6pm - Pager - [phone] After 6pm go to [email or web address] - password [email or web address] [facility]  [facility]  Office  [phone]  CC: Primary care physician; No PCP Per Patient

## 2014-09-07 LAB — BASIC METABOLIC PANEL
Anion gap: 4 — ABNORMAL LOW (ref 5–15)
BUN: 31 mg/dL — AB (ref 6–20)
CALCIUM: 8.1 mg/dL — AB (ref 8.9–10.3)
CHLORIDE: 112 mmol/L — AB (ref 101–111)
CO2: 25 mmol/L (ref 22–32)
CREATININE: 1 mg/dL (ref 0.44–1.00)
GFR calc non Af Amer: >60 mL/min (ref >60)
GLUCOSE: 88 mg/dL (ref 65–99)
Potassium: 3.5 mmol/L (ref 3.5–5.1)
Sodium: 141 mmol/L (ref 135–145)

## 2014-09-07 MED ORDER — ACETAMINOPHEN 325 MG PO TABS: 650.0000 mg | ORAL_TABLET | Freq: Four times a day (QID) | ORAL | Status: DC | PRN

## 2014-09-07 MED ORDER — HYDRALAZINE HCL 25 MG PO TABS: 25.0000 mg | ORAL_TABLET | Freq: Three times a day (TID) | ORAL | Status: DC

## 2014-09-07 MED ORDER — HYDROCODONE-ACETAMINOPHEN 10-325 MG PO TABS: 0.5000 | ORAL_TABLET | Freq: Four times a day (QID) | ORAL | Status: DC | PRN

## 2014-09-07 NOTE — Progress Notes (Signed)
Patient is discharge in a stable condition, verbalized relief of pain at discharge , summary given was informed to call for f/u  Appointment  , verbalized understanding, left with a family member

## 2014-09-07 NOTE — Care Management (Signed)
Faxed referral to [facility] for physical therapy

## 2014-09-07 NOTE — Discharge Summary (Signed)
[facility] [place] at [facility]   PATIENT NAME: Kimberly LeschesLeatha Mccarthy    MR#:  [id number]  DATE OF BIRTH:  07/22/1955  DATE OF ADMISSION:  09/05/2014 ADMITTING PHYSICIAN: Oralia Manisavid Willis, MD  DATE OF DISCHARGE: 09/07/2014  1:05 PM  PRIMARY CARE PHYSICIAN: No PCP Per Patient    ADMISSION DIAGNOSIS:  Abdominal pain, right upper quadrant [R10.11] Hypertensive emergency [I10] Acute renal failure, unspecified acute renal failure type [N17.9]  DISCHARGE DIAGNOSIS:  Principal Problem:   Accelerated hypertension Active Problems:   Gastritis without bleeding   Elevated troponin   AKI (acute kidney injury)   Hypothyroidism   SECONDARY DIAGNOSIS:   Past Medical History  Diagnosis Date  . Stroke     may 2015  . Hypertension   . Thyroid disease   . H/O blood clots   . Anemia   . AVM (arteriovenous malformation)   . Anemia   . Renal failure   . PVD (peripheral vascular disease)   . Hyperkalemia   . Carotid artery stenosis     HOSPITAL COURSE:  History and physical- review H&P dictated by the admitting physician Patient came in to the ED with a chief complaint of nausea vomiting and diarrhea for 4 days prior to admission. She was also complaining of abdominal pain and denies any blood in her emesis or stool. Because of her persistent nausea and vomiting she was unable to keep any of her by mouth medications including blood pressure medications. Patient's blood pressure was elevated in the ED. Her troponin is also elevated and the creatinine was abnormal. Patient was admitted to the hospitalist service  Hospital course  #1 accelerated hypertension-patient was continued on beta blocker and clonidine. Hydralazine as added to the regimen and dose was adjusted. Today her blood pressure is much better. Patient denies any chest pain shortness of breath, headache and blurry vision  #2 elevated troponin-cycle cardiac biomarkers. No tremor noticed. Elevated troponin is  thought to be from accelerated hypertension.  #3 acute gastritis-patient was initially kept nothing by mouth. Abdominal ultrasound is negative for any pathology. With proton pump inhibitor, antiemetics and pain medications as needed basis her clinical situation significantly improved. Patient is started on clear liquids and slowly her diet was advanced. Patient denies any complaints today.  #4 hypothyroidism-chronic in nature. Continue Synthroid. Her TSH is normal.  #5-peripheral vascular disease-follow-up with restless surgery as scheduled as an outpatient  Patient's clinical situation isn't significantly improved and that she is close to her baseline. Discharging home with home health under satisfactory condition.  DISCHARGE CONDITIONS AND DIET:  Satisfactory  Diet low-sodium  CONSULTS OBTAINED:    none       DRUG ALLERGIES:  No Known Allergies  DISCHARGE MEDICATIONS:   Discharge Medication List as of 09/07/2014 12:16 PM    START taking these medications   Details  acetaminophen (TYLENOL) 325 MG tablet Take 2 tablets (650 mg total) by mouth every 6 (six) hours as needed for mild pain (or Fever >/= 101)., Starting 09/07/2014, Until Discontinued, OTC    hydrALAZINE (APRESOLINE) 25 MG tablet Take 1 tablet (25 mg total) by mouth every 8 (eight) hours., Starting 09/07/2014, Until Discontinued, Print      CONTINUE these medications which have CHANGED   Details  HYDROcodone-acetaminophen (NORCO) 10-325 MG per tablet Take 0.5-1 tablets by mouth every 6 (six) hours as needed for severe pain., Starting 09/07/2014, Until Discontinued, Print      CONTINUE these medications which have NOT CHANGED  Details  amLODipine (NORVASC) 10 MG tablet Take 10 mg by mouth daily., Until Discontinued, Historical Med    aspirin EC 81 MG tablet Take 81 mg by mouth daily., Until Discontinued, Historical Med    carvedilol (COREG) 25 MG tablet Take 25 mg by mouth 2 (two) times daily with a meal., Until  Discontinued, Historical Med    cloNIDine (CATAPRES) 0.3 MG tablet Take 0.3 mg by mouth 2 (two) times daily., Until Discontinued, Historical Med    docusate sodium (COLACE) 100 MG capsule Take 100 mg by mouth 2 (two) times daily., Until Discontinued, Historical Med    ferrous sulfate 325 (65 FE) MG tablet Take 325 mg by mouth daily with breakfast., Until Discontinued, Historical Med    gabapentin (NEURONTIN) 300 MG capsule Take 300 mg by mouth 3 (three) times daily., Until Discontinued, Historical Med    hydrochlorothiazide (MICROZIDE) 12.5 MG capsule Take 12.5 mg by mouth daily., Until Discontinued, Historical Med    levothyroxine (SYNTHROID, LEVOTHROID) 50 MCG tablet Take 50 mcg by mouth daily., Until Discontinued, Historical Med    Multiple Vitamin (MULTIVITAMIN WITH MINERALS) TABS tablet Take 1 tablet by mouth daily., Until Discontinued, Historical Med    pantoprazole (PROTONIX) 40 MG tablet Take 40 mg by mouth 2 (two) times daily., Until Discontinued, Historical Med    pravastatin (PRAVACHOL) 10 MG tablet Take 10 mg by mouth at bedtime., Until Discontinued, Historical Med    sucralfate (CARAFATE) 1 G tablet Take 1 g by mouth 3 (three) times daily., Until Discontinued, Historical Med    ondansetron (ZOFRAN) 4 MG tablet Take 1 tablet (4 mg total) by mouth every 8 (eight) hours as needed for nausea or vomiting., Starting 09/02/2014, Until Mon 09/02/15, Print              Today   CHIEF COMPLAINT:  Denies any abdominal discomfort, denies nausea vomiting. Tolerating by mouth with no issues.   VITAL SIGNS:  Blood pressure 130/61, pulse 59, temperature 98.5 F (36.9 C), temperature source Oral, resp. rate 20, height 5\' 7"  (1.702 m), weight 75.342 kg (166 lb 1.6 oz), SpO2 99 %.   REVIEW OF SYSTEMS:  ROS   CONSTITUTIONAL: No fever, fatigue or weakness.  EYES: No blurred or double vision.  EARS, NOSE, AND THROAT: No tinnitus or ear pain.  RESPIRATORY: No cough, shortness  of breath, wheezing or hemoptysis.  CARDIOVASCULAR: No chest pain, orthopnea, edema.  GASTROINTESTINAL: No nausea, vomiting, diarrhea or some generalized abdominal pain.  GENITOURINARY: No dysuria, hematuria.  ENDOCRINE: No polyuria, nocturia,  HEMATOLOGY: No anemia, easy bruising or bleeding SKIN: No rash or lesion. MUSCULOSKELETAL: No joint pain or arthritis.  NEUROLOGIC: No tingling, numbness, weakness.  PSYCHIATRY: No anxiety or depression.    PHYSICAL EXAMINATION:  GENERAL:  59 y.o.-year-old patient lying in the bed with no acute distress.  NECK:  Supple, no jugular venous distention. No thyroid enlargement, no tenderness.  LUNGS: Normal breath sounds bilaterally, no wheezing, rales,rhonchi  No use of accessory muscles of respiration.  CARDIOVASCULAR: S1, S2 normal. No murmurs, rubs, or gallops.  ABDOMEN: Soft, non-tender, non-distended. Bowel sounds present. No organomegaly or mass.  EXTREMITIES: No pedal edema, cyanosis, or clubbing.  PSYCHIATRIC: The patient is alert and oriented x 3.  SKIN: No obvious rash, lesion, or ulcer.   DATA REVIEW:   CBC  Recent Labs Lab 09/06/14 0625  WBC 7.8  HGB 12.9  HCT 39.1  PLT 199    Chemistries   Recent Labs Lab 09/05/14 1205  09/07/14 0401  NA 143  < > 141  K 3.4*  < > 3.5  CL 105  < > 112*  CO2 26  < > 25  GLUCOSE 111*  < > 88  BUN 34*  < > 31*  CREATININE 1.25*  < > 1.00  CALCIUM 9.4  < > 8.1*  AST 128*  --   --   ALT 103*  --   --   ALKPHOS 77  --   --   BILITOT 1.6*  --   --   < > = values in this interval not displayed.  Cardiac Enzymes  Recent Labs Lab 09/05/14 2354 09/06/14 0625 09/06/14 1250  TROPONINI 0.07* 0.06* 0.05*    Microbiology Results  Results for orders placed or performed in visit on 09/15/12  Urine culture     Status: None   Collection Time: 09/17/12  6:35 AM  Result Value Ref Range Status   Micro Text Report   Final       SOURCE: CLEAN CATCH URINE    COMMENT                    MIXED BACTERIAL ORGANISMS   COMMENT                   RESULTS SUGGESTIVE OF CONTAMINATION   ANTIBIOTIC                                                        RADIOLOGY:  US Abdomen Limited Ruq  09/05/2014   CLINICAL DATA:  Abdominal pain with nausea and vomiting.  EXAM: US ABDOMEN LIMITED - RIGHT UPPER QUADRANT  COMPARISON:  None.  FINDINGS: Gallbladder:  No gallstones or wall thickening visualized. No sonographic Murphy sign noted.  Common bile duct:  Diameter: Normal, 2 mm.  Liver:  No focal lesion identified. Within normal limits in parenchymal echogenicity.  IMPRESSION: No acute process or explanation for right upper quadrant pain. Normal right upper quadrant ultrasound.   Electronically Signed   By: Jeronimo Greaves M.D.   On: 09/05/2014 16:51      Management plans discussed with the patient and she is in agreement. Stable for discharge home with home health  CODE STATUS:     Code Status Orders        Start     Ordered   09/05/14 2317  Full code   Continuous     09/05/14 2316      TOTAL TIME TAKING CARE OF THIS PATIENT: 45 minutes.    Ramonita Lab M.D on 09/07/2014 at 3:46 PM  Between 7am to 6pm - Pager - [phone] After 6pm go to [email or web address] - password [email or web address]  [facility]  Office  [phone]  CC: Primary care physician; No PCP Per Patient

## 2014-11-22 ENCOUNTER — Other Ambulatory Visit: Payer: Self-pay | Admitting: Nurse Practitioner

## 2014-11-22 DIAGNOSIS — Z1231 Encounter for screening mammogram for malignant neoplasm of breast: Secondary | ICD-10-CM

## 2014-12-02 ENCOUNTER — Encounter: Payer: Self-pay | Admitting: Emergency Medicine

## 2014-12-02 ENCOUNTER — Inpatient Hospital Stay
Admission: EM | Admit: 2014-12-02 | Discharge: 2014-12-04 | DRG: 683 | Disposition: A | Discharge status: Home / Self Care | Payer: Medicaid Other | Attending: Internal Medicine | Admitting: Internal Medicine

## 2014-12-02 ENCOUNTER — Emergency Department: Payer: Medicaid Other

## 2014-12-02 DIAGNOSIS — Z8673 Personal history of transient ischemic attack (TIA), and cerebral infarction without residual deficits: Secondary | ICD-10-CM | POA: Diagnosis not present

## 2014-12-02 DIAGNOSIS — Z833 Family history of diabetes mellitus: Secondary | ICD-10-CM

## 2014-12-02 DIAGNOSIS — Z8249 Family history of ischemic heart disease and other diseases of the circulatory system: Secondary | ICD-10-CM | POA: Diagnosis not present

## 2014-12-02 DIAGNOSIS — K529 Noninfective gastroenteritis and colitis, unspecified: Secondary | ICD-10-CM

## 2014-12-02 DIAGNOSIS — Z9889 Other specified postprocedural states: Secondary | ICD-10-CM | POA: Diagnosis not present

## 2014-12-02 DIAGNOSIS — Z79899 Other long term (current) drug therapy: Secondary | ICD-10-CM | POA: Diagnosis not present

## 2014-12-02 DIAGNOSIS — Z7982 Long term (current) use of aspirin: Secondary | ICD-10-CM | POA: Diagnosis not present

## 2014-12-02 DIAGNOSIS — R7989 Other specified abnormal findings of blood chemistry: Secondary | ICD-10-CM | POA: Diagnosis present

## 2014-12-02 DIAGNOSIS — R1013 Epigastric pain: Secondary | ICD-10-CM | POA: Diagnosis not present

## 2014-12-02 DIAGNOSIS — I129 Hypertensive chronic kidney disease with stage 1 through stage 4 chronic kidney disease, or unspecified chronic kidney disease: Secondary | ICD-10-CM | POA: Diagnosis not present

## 2014-12-02 DIAGNOSIS — J449 Chronic obstructive pulmonary disease, unspecified: Secondary | ICD-10-CM | POA: Diagnosis present

## 2014-12-02 DIAGNOSIS — Z79891 Long term (current) use of opiate analgesic: Secondary | ICD-10-CM | POA: Diagnosis not present

## 2014-12-02 DIAGNOSIS — E782 Mixed hyperlipidemia: Secondary | ICD-10-CM | POA: Diagnosis present

## 2014-12-02 DIAGNOSIS — G5790 Unspecified mononeuropathy of unspecified lower limb: Secondary | ICD-10-CM | POA: Diagnosis present

## 2014-12-02 DIAGNOSIS — I248 Other forms of acute ischemic heart disease: Secondary | ICD-10-CM | POA: Diagnosis present

## 2014-12-02 DIAGNOSIS — F17210 Nicotine dependence, cigarettes, uncomplicated: Secondary | ICD-10-CM | POA: Diagnosis present

## 2014-12-02 DIAGNOSIS — N183 Chronic kidney disease, stage 3 (moderate): Secondary | ICD-10-CM | POA: Diagnosis present

## 2014-12-02 DIAGNOSIS — R740 Nonspecific elevation of levels of transaminase and lactic acid dehydrogenase [LDH]: Secondary | ICD-10-CM | POA: Diagnosis present

## 2014-12-02 DIAGNOSIS — E875 Hyperkalemia: Secondary | ICD-10-CM | POA: Diagnosis present

## 2014-12-02 DIAGNOSIS — K2900 Acute gastritis without bleeding: Secondary | ICD-10-CM | POA: Diagnosis present

## 2014-12-02 DIAGNOSIS — R270 Ataxia, unspecified: Secondary | ICD-10-CM | POA: Diagnosis present

## 2014-12-02 DIAGNOSIS — R748 Abnormal levels of other serum enzymes: Secondary | ICD-10-CM

## 2014-12-02 DIAGNOSIS — N19 Unspecified kidney failure: Secondary | ICD-10-CM | POA: Diagnosis present

## 2014-12-02 DIAGNOSIS — I10 Essential (primary) hypertension: Secondary | ICD-10-CM | POA: Diagnosis present

## 2014-12-02 DIAGNOSIS — I739 Peripheral vascular disease, unspecified: Secondary | ICD-10-CM | POA: Diagnosis present

## 2014-12-02 DIAGNOSIS — R079 Chest pain, unspecified: Secondary | ICD-10-CM

## 2014-12-02 DIAGNOSIS — G629 Polyneuropathy, unspecified: Secondary | ICD-10-CM | POA: Diagnosis present

## 2014-12-02 DIAGNOSIS — I160 Hypertensive urgency: Secondary | ICD-10-CM

## 2014-12-02 LAB — TROPONIN I: Troponin I: <0.03 ng/mL (ref <0.031)

## 2014-12-02 LAB — CBC
HCT: 43.2 % (ref 35.0–47.0)
Hemoglobin: 14.5 g/dL (ref 12.0–16.0)
MCH: 30 pg (ref 26.0–34.0)
MCHC: 33.5 g/dL (ref 32.0–36.0)
MCV: 89.4 fL (ref 80.0–100.0)
PLATELETS: 372 10*3/uL (ref 150–440)
RBC: 4.84 MIL/uL (ref 3.80–5.20)
RDW: 16.1 % — AB (ref 11.5–14.5)
WBC: 8.9 10*3/uL (ref 3.6–11.0)

## 2014-12-02 LAB — BASIC METABOLIC PANEL
Anion gap: 9 (ref 5–15)
BUN: 15 mg/dL (ref 6–20)
CO2: 26 mmol/L (ref 22–32)
Calcium: 9.8 mg/dL (ref 8.9–10.3)
Chloride: 103 mmol/L (ref 101–111)
Creatinine, Ser: 0.9 mg/dL (ref 0.44–1.00)
GFR calc Af Amer: >60 mL/min (ref >60)
GFR calc non Af Amer: >60 mL/min (ref >60)
Glucose, Bld: 129 mg/dL — ABNORMAL HIGH (ref 65–99)
Potassium: 3.4 mmol/L — ABNORMAL LOW (ref 3.5–5.1)
Sodium: 138 mmol/L (ref 135–145)

## 2014-12-02 LAB — HEPATIC FUNCTION PANEL
ALK PHOS: 129 U/L — AB (ref 38–126)
ALT: 478 U/L — ABNORMAL HIGH (ref 14–54)
ALT: 600 U/L — ABNORMAL HIGH (ref 14–54)
AST: 1092 U/L — AB (ref 15–41)
AST: 712 U/L — ABNORMAL HIGH (ref 15–41)
Albumin: 4.4 g/dL (ref 3.5–5.0)
Albumin: 4.1 g/dL (ref 3.5–5.0)
Alkaline Phosphatase: 147 U/L — ABNORMAL HIGH (ref 38–126)
BILIRUBIN INDIRECT: 0.4 mg/dL (ref 0.3–0.9)
Bilirubin, Direct: 0.5 mg/dL (ref 0.1–0.5)
Bilirubin, Direct: 0.2 mg/dL (ref 0.1–0.5)
Indirect Bilirubin: 0.5 mg/dL (ref 0.3–0.9)
TOTAL PROTEIN: 8.4 g/dL — AB (ref 6.5–8.1)
Total Bilirubin: 0.9 mg/dL (ref 0.3–1.2)
Total Bilirubin: 0.7 mg/dL (ref 0.3–1.2)
Total Protein: 8.2 g/dL — ABNORMAL HIGH (ref 6.5–8.1)

## 2014-12-02 LAB — TSH: TSH: 0.544 u[IU]/mL (ref 0.350–4.500)

## 2014-12-02 LAB — LIPASE, BLOOD: Lipase: 29 U/L (ref 22–51)

## 2014-12-02 LAB — PROTIME-INR
INR: 0.99
Prothrombin Time: 13.3 seconds (ref 11.4–15.0)

## 2014-12-02 LAB — ACETAMINOPHEN LEVEL: Acetaminophen (Tylenol), Serum: <10 ug/mL — ABNORMAL LOW (ref 10–30)

## 2014-12-02 LAB — ETHANOL: Alcohol, Ethyl (B): <5 mg/dL (ref <5)

## 2014-12-02 LAB — MRSA PCR SCREENING: MRSA BY PCR: NEGATIVE

## 2014-12-02 MED ORDER — ONDANSETRON HCL 4 MG/2ML IJ SOLN
4.0000 mg | Freq: Four times a day (QID) | INTRAMUSCULAR | Status: DC | PRN
  Administered 2014-12-02: 4 mg via INTRAVENOUS
  Filled 2014-12-02: qty 2

## 2014-12-02 MED ORDER — PANTOPRAZOLE SODIUM 40 MG PO TBEC
40.0000 mg | DELAYED_RELEASE_TABLET | Freq: Two times a day (BID) | ORAL | Status: DC
  Administered 2014-12-02 – 2014-12-04 (×4): 40 mg via ORAL
  Filled 2014-12-02 (×4): qty 1

## 2014-12-02 MED ORDER — ACETAMINOPHEN 650 MG RE SUPP: 650.0000 mg | Freq: Four times a day (QID) | RECTAL | Status: DC | PRN

## 2014-12-02 MED ORDER — SODIUM CHLORIDE 0.9 % IV BOLUS (SEPSIS)
1000.0000 mL | Freq: Once | INTRAVENOUS | Status: AC
  Administered 2014-12-02: 1000 mL via INTRAVENOUS

## 2014-12-02 MED ORDER — LEVOTHYROXINE SODIUM 50 MCG PO TABS
50.0000 ug | ORAL_TABLET | Freq: Every day | ORAL | Status: DC
  Administered 2014-12-03 – 2014-12-04 (×2): 50 ug via ORAL
  Filled 2014-12-02 (×2): qty 1

## 2014-12-02 MED ORDER — LISINOPRIL 20 MG PO TABS
40.0000 mg | ORAL_TABLET | Freq: Every day | ORAL | Status: DC
  Administered 2014-12-02 – 2014-12-04 (×3): 40 mg via ORAL
  Filled 2014-12-02 (×3): qty 2

## 2014-12-02 MED ORDER — ACETAMINOPHEN 325 MG PO TABS: 650.0000 mg | ORAL_TABLET | Freq: Four times a day (QID) | ORAL | Status: DC | PRN

## 2014-12-02 MED ORDER — ONDANSETRON HCL 4 MG/2ML IJ SOLN
4.0000 mg | Freq: Once | INTRAMUSCULAR | Status: AC
  Administered 2014-12-02: 4 mg via INTRAVENOUS
  Filled 2014-12-02: qty 2

## 2014-12-02 MED ORDER — LABETALOL HCL 5 MG/ML IV SOLN
20.0000 mg | Freq: Once | INTRAVENOUS | Status: AC
  Administered 2014-12-02: 20 mg via INTRAVENOUS

## 2014-12-02 MED ORDER — ASPIRIN 81 MG PO CHEW
81.0000 mg | CHEWABLE_TABLET | Freq: Every day | ORAL | Status: DC
  Administered 2014-12-02 – 2014-12-04 (×3): 81 mg via ORAL
  Filled 2014-12-02 (×3): qty 1

## 2014-12-02 MED ORDER — ONDANSETRON HCL 4 MG/2ML IJ SOLN
INTRAMUSCULAR | Status: AC
  Administered 2014-12-02: 4 mg via INTRAVENOUS
  Filled 2014-12-02: qty 2

## 2014-12-02 MED ORDER — MORPHINE SULFATE 4 MG/ML IJ SOLN
4.0000 mg | Freq: Once | INTRAMUSCULAR | Status: AC
  Administered 2014-12-02: 4 mg via INTRAVENOUS
  Filled 2014-12-02: qty 1

## 2014-12-02 MED ORDER — ADULT MULTIVITAMIN W/MINERALS CH
1.0000 | ORAL_TABLET | Freq: Every day | ORAL | Status: DC
  Administered 2014-12-03 – 2014-12-04 (×2): 1 via ORAL
  Filled 2014-12-02 (×3): qty 1

## 2014-12-02 MED ORDER — FERROUS SULFATE 325 (65 FE) MG PO TABS
325.0000 mg | ORAL_TABLET | Freq: Every day | ORAL | Status: DC
  Administered 2014-12-03 – 2014-12-04 (×2): 325 mg via ORAL
  Filled 2014-12-02 (×2): qty 1

## 2014-12-02 MED ORDER — IBUPROFEN 400 MG PO TABS
400.0000 mg | ORAL_TABLET | Freq: Once | ORAL | Status: AC
  Filled 2014-12-02: qty 1

## 2014-12-02 MED ORDER — HYDROCODONE-ACETAMINOPHEN 10-325 MG PO TABS
0.5000 | ORAL_TABLET | Freq: Four times a day (QID) | ORAL | Status: DC | PRN
  Administered 2014-12-03: 1 via ORAL
  Filled 2014-12-02: qty 1

## 2014-12-02 MED ORDER — AMLODIPINE BESYLATE 10 MG PO TABS
10.0000 mg | ORAL_TABLET | Freq: Every day | ORAL | Status: DC
  Administered 2014-12-02 – 2014-12-04 (×3): 10 mg via ORAL
  Filled 2014-12-02 (×3): qty 1

## 2014-12-02 MED ORDER — LABETALOL HCL 5 MG/ML IV SOLN
INTRAVENOUS | Status: AC
  Administered 2014-12-02: 20 mg via INTRAVENOUS
  Filled 2014-12-02: qty 4

## 2014-12-02 MED ORDER — HEPARIN SODIUM (PORCINE) 5000 UNIT/ML IJ SOLN
5000.0000 [IU] | Freq: Three times a day (TID) | INTRAMUSCULAR | Status: DC
  Administered 2014-12-02 – 2014-12-04 (×5): 5000 [IU] via SUBCUTANEOUS
  Filled 2014-12-02 (×6): qty 1

## 2014-12-02 MED ORDER — HYDRALAZINE HCL 25 MG PO TABS
25.0000 mg | ORAL_TABLET | Freq: Three times a day (TID) | ORAL | Status: DC
  Administered 2014-12-02 – 2014-12-04 (×3): 25 mg via ORAL
  Filled 2014-12-02 (×5): qty 1

## 2014-12-02 MED ORDER — SUCRALFATE 1 G PO TABS
1.0000 g | ORAL_TABLET | Freq: Three times a day (TID) | ORAL | Status: DC
  Administered 2014-12-03 – 2014-12-04 (×5): 1 g via ORAL
  Filled 2014-12-02 (×6): qty 1

## 2014-12-02 MED ORDER — LABETALOL HCL 5 MG/ML IV SOLN
40.0000 mg | Freq: Once | INTRAVENOUS | Status: AC
  Administered 2014-12-02: 40 mg via INTRAVENOUS
  Filled 2014-12-02: qty 8

## 2014-12-02 MED ORDER — HYDROCHLOROTHIAZIDE 12.5 MG PO CAPS
12.5000 mg | ORAL_CAPSULE | Freq: Every day | ORAL | Status: DC
  Administered 2014-12-03 – 2014-12-04 (×2): 12.5 mg via ORAL
  Filled 2014-12-02 (×2): qty 1

## 2014-12-02 MED ORDER — GABAPENTIN 300 MG PO CAPS
300.0000 mg | ORAL_CAPSULE | Freq: Three times a day (TID) | ORAL | Status: DC
  Administered 2014-12-03: 300 mg via ORAL
  Filled 2014-12-02 (×2): qty 1

## 2014-12-02 MED ORDER — DOCUSATE SODIUM 100 MG PO CAPS
100.0000 mg | ORAL_CAPSULE | Freq: Two times a day (BID) | ORAL | Status: DC
  Administered 2014-12-03 – 2014-12-04 (×3): 100 mg via ORAL
  Filled 2014-12-02 (×4): qty 1

## 2014-12-02 MED ORDER — NICOTINE 21 MG/24HR TD PT24
21.0000 mg | MEDICATED_PATCH | Freq: Every day | TRANSDERMAL | Status: DC
  Filled 2014-12-02 (×3): qty 1

## 2014-12-02 MED ORDER — ONDANSETRON HCL 4 MG PO TABS: 4.0000 mg | ORAL_TABLET | Freq: Four times a day (QID) | ORAL | Status: DC | PRN

## 2014-12-02 MED ORDER — HYDROMORPHONE HCL 1 MG/ML IJ SOLN
2.0000 mg | INTRAMUSCULAR | Status: DC | PRN
  Administered 2014-12-02: 2 mg via INTRAVENOUS
  Administered 2014-12-03 – 2014-12-04 (×3): 1 mg via INTRAVENOUS
  Filled 2014-12-02: qty 1
  Filled 2014-12-02: qty 2
  Filled 2014-12-02 (×2): qty 1

## 2014-12-02 MED ORDER — LISINOPRIL 20 MG PO TABS: 40.0000 mg | ORAL_TABLET | Freq: Every day | ORAL | Status: DC

## 2014-12-02 MED ORDER — SODIUM CHLORIDE 0.9 % IJ SOLN: 3.0000 mL | INTRAMUSCULAR | Status: DC | PRN

## 2014-12-02 MED ORDER — SODIUM CHLORIDE 0.9 % IJ SOLN
3.0000 mL | Freq: Two times a day (BID) | INTRAMUSCULAR | Status: DC
  Administered 2014-12-03: 3 mL via INTRAVENOUS

## 2014-12-02 MED ORDER — ONDANSETRON HCL 4 MG/2ML IJ SOLN
4.0000 mg | Freq: Once | INTRAMUSCULAR | Status: AC
  Administered 2014-12-02: 4 mg via INTRAVENOUS

## 2014-12-02 MED ORDER — POTASSIUM CHLORIDE CRYS ER 20 MEQ PO TBCR
40.0000 meq | EXTENDED_RELEASE_TABLET | Freq: Once | ORAL | Status: DC
  Filled 2014-12-02 (×2): qty 2

## 2014-12-02 MED ORDER — CARVEDILOL 25 MG PO TABS
25.0000 mg | ORAL_TABLET | Freq: Once | ORAL | Status: AC
  Administered 2014-12-02: 25 mg via ORAL
  Filled 2014-12-02 (×2): qty 1

## 2014-12-02 MED ORDER — AMLODIPINE BESYLATE 10 MG PO TABS: 10.0000 mg | ORAL_TABLET | Freq: Every day | ORAL | Status: DC

## 2014-12-02 MED ORDER — CLONIDINE HCL 0.1 MG PO TABS
0.3000 mg | ORAL_TABLET | Freq: Two times a day (BID) | ORAL | Status: DC
  Administered 2014-12-02 – 2014-12-04 (×3): 0.3 mg via ORAL
  Filled 2014-12-02 (×3): qty 3

## 2014-12-02 MED ORDER — CARVEDILOL 25 MG PO TABS
25.0000 mg | ORAL_TABLET | Freq: Two times a day (BID) | ORAL | Status: DC
  Administered 2014-12-03 – 2014-12-04 (×2): 25 mg via ORAL
  Filled 2014-12-02 (×3): qty 1

## 2014-12-02 MED ORDER — SODIUM CHLORIDE 0.9 % IV SOLN: 250.0000 mL | INTRAVENOUS | Status: DC | PRN

## 2014-12-02 MED ORDER — NICARDIPINE HCL IN NACL 20-0.86 MG/200ML-% IV SOLN
3.0000 mg/h | INTRAVENOUS | Status: DC
  Administered 2014-12-02: 5 mg/h via INTRAVENOUS
  Filled 2014-12-02: qty 200

## 2014-12-02 MED ORDER — NITROPRUSSIDE SODIUM 25 MG/ML IV SOLN
0.0000 ug/kg/min | INTRAVENOUS | Status: DC
  Administered 2014-12-02: 0.3 ug/kg/min via INTRAVENOUS
  Filled 2014-12-02: qty 2

## 2014-12-02 MED ORDER — CLONIDINE HCL 0.1 MG PO TABS
0.3000 mg | ORAL_TABLET | Freq: Once | ORAL | Status: AC
  Administered 2014-12-02: 0.3 mg via ORAL
  Filled 2014-12-02 (×2): qty 3

## 2014-12-02 MED ORDER — GI COCKTAIL ~~LOC~~
30.0000 mL | Freq: Once | ORAL | Status: DC
  Filled 2014-12-02: qty 30

## 2014-12-02 NOTE — ED Notes (Addendum)
Pt declined the motrin =- labetalol given as ordered

## 2014-12-02 NOTE — ED Notes (Signed)
Patient vomited approximately emesis. Zofran given as ordered. Patient unable to tolerate PO blood pressure medications/GI cocktail. Labetalol via IV given as ordered.

## 2014-12-02 NOTE — H&P (Addendum)
[facility] - [place] at [facility]   PATIENT NAME: Kimberly Mccarthy    MR#:  [id number]  DATE OF BIRTH:  05-Dec-1955  DATE OF ADMISSION:  12/02/2014  PRIMARY CARE PHYSICIAN:  [facility] REQUESTING/REFERRING PHYSICIAN: Dr. Joellyn Haff  CHIEF COMPLAINT:   Chief Complaint  Patient presents with  . Chest Pain    HISTORY OF PRESENT ILLNESS: Kimberly Mccarthy  is a 59 y.o. female with a known history of  CVA, hypertension and peripheral vascular disease, Chronic kidney disease hyperlipidemia,   and nicotine addiction presents to the ED with multiple complaints. She reports that that this morning she woke up with chest pain. Center of her chest she describes it as a severe type of pain he continued to persist. There was no radiation of the pain she did have some shortness of breath. She also had to episode of diarrhea and no vomiting and nausea. The patient also started having headache later in the day in the posterior aspect of her head as sharp. When she arrived to the ER patient's blood pressure systolic was noted to be greater than 200s. She received multiple doses of labetalol IV and clonidine for blood pressure continues to be over 200. She continues to complaint of having chest pain and headache.   PAST MEDICAL HISTORY:   Past Medical History  Diagnosis Date  . Stroke     may 2015  . Hypertension   . Thyroid disease   . H/O blood clots   . Anemia   . AVM (arteriovenous malformation)   . Anemia   . Renal failure   . PVD (peripheral vascular disease)   . Hyperkalemia   . Carotid artery stenosis     PAST SURGICAL HISTORY:  Past Surgical History  Procedure Laterality Date  . Left foot surgery  02/2014  . Leg surgery Left   . Femoral endarterectomy Right     SOCIAL HISTORY:  History  Substance Use Topics  . Smoking status: Current Every Day Smoker -- 1.00 packs/day for 20 years  . Smokeless tobacco: Not on file  . Alcohol Use: No    FAMILY  HISTORY:  Family History  Problem Relation Age of Onset  . Diabetes Mother   . Hypertension Mother   . Hypertension Sister   . Thyroid disease Sister     DRUG ALLERGIES: No Known Allergies  REVIEW OF SYSTEMS:   CONSTITUTIONAL: No fever,  positive fatigueandness.  EYES: No blurred or double vision.  EARS, NOSE, AND THROAT: No tinnitus or ear pain.  RESPIRATORY: No cough, shortness of breath, wheezing or hemoptysis.  CARDIOVASCULAR:positivehest pain, orthopnea, edema.  GASTROINTESTINAL:positiveusea, vomiting, diarrhea or abdominal pain.  GENITOURINARY: No dysuria, hematuria.  ENDOCRINE: No polyuria, nocturia,  HEMATOLOGY: No anemia, easy bruising or bleeding SKIN: No rash or lesion. MUSCULOSKELETAL: No joint pain or arthritis.   NEUROLOGIC: No tingling, numbness, weakness.  PSYCHIATRY: No anxiety or depression.   MEDICATIONS AT HOME:  Prior to Admission medications   Medication Sig Start Date End Date Taking? Authorizing Provider  acetaminophen (TYLENOL) 325 MG tablet Take 2 tablets (650 mg total) by mouth every 6 (six) hours as needed for mild pain (or Fever >/= 101). 09/07/14  Yes Ramonita Lab, MD  amLODipine (NORVASC) 10 MG tablet Take 10 mg by mouth daily.   Yes Historical Provider, MD  carvedilol (COREG) 25 MG tablet Take 25 mg by mouth 2 (two) times daily with a meal.   Yes Historical Provider, MD  cloNIDine (CATAPRES)  0.3 MG tablet Take 0.3 mg by mouth 2 (two) times daily.   Yes Historical Provider, MD  docusate sodium (COLACE) 100 MG capsule Take 100 mg by mouth 2 (two) times daily.   Yes Historical Provider, MD  ferrous sulfate 325 (65 FE) MG tablet Take 325 mg by mouth daily with breakfast.   Yes Historical Provider, MD  gabapentin (NEURONTIN) 300 MG capsule Take 300 mg by mouth 3 (three) times daily.   Yes Historical Provider, MD  hydrALAZINE (APRESOLINE) 25 MG tablet Take 1 tablet (25 mg total) by mouth every 8 (eight) hours. 09/07/14  Yes Ramonita Lab, MD   hydrochlorothiazide (MICROZIDE) 12.5 MG capsule Take 12.5 mg by mouth daily.   Yes Historical Provider, MD  HYDROcodone-acetaminophen (NORCO) 10-325 MG per tablet Take 0.5-1 tablets by mouth every 6 (six) hours as needed for severe pain. 09/07/14  Yes Ramonita Lab, MD  levothyroxine (SYNTHROID, LEVOTHROID) 50 MCG tablet Take 50 mcg by mouth daily.   Yes Historical Provider, MD  lisinopril (PRINIVIL,ZESTRIL) 40 MG tablet Take 40 mg by mouth daily.   Yes Historical Provider, MD  Multiple Vitamin (MULTIVITAMIN WITH MINERALS) TABS tablet Take 1 tablet by mouth daily.   Yes Historical Provider, MD  pantoprazole (PROTONIX) 40 MG tablet Take 40 mg by mouth 2 (two) times daily.   Yes Historical Provider, MD  pravastatin (PRAVACHOL) 10 MG tablet Take 10 mg by mouth at bedtime.   Yes Historical Provider, MD  sucralfate (CARAFATE) 1 G tablet Take 1 g by mouth 3 (three) times daily.   Yes Historical Provider, MD  ondansetron (ZOFRAN) 4 MG tablet Take 1 tablet (4 mg total) by mouth every 8 (eight) hours as needed for nausea or vomiting. 09/02/14 09/02/15  Phineas Semen, MD      PHYSICAL EXAMINATION:   VITAL SIGNS: Blood pressure 229/99, pulse 83, temperature 98.9 F (37.2 C), temperature source Oral, resp. rate 20, height 5\' 7"  (1.702 m), weight 81.647 kg (180 lb), SpO2 100 %.  GENERAL:  59 y.o.-year-old patient lying in the bed with no acute distress.  EYES: Pupils equal, round, reactive to light and accommodation. No scleral icterus. Extraocular muscles intact.  HEENT: Head atraumatic, normocephalic. Oropharynx and nasopharynx clear.  NECK:  Supple, no jugular venous distention. No thyroid enlargement, no tenderness.  LUNGS: Normal breath sounds bilaterally, no wheezing, rales,rhonchi or crepitation. No use of accessory muscles of respiration.  CARDIOVASCULAR: S1, S2 normal. No murmurs, rubs, or gallops.  ABDOMEN: Soft, nontender, nondistended. Bowel sounds present. No organomegaly or mass.  EXTREMITIES:  No pedal edema, cyanosis, or clubbing.  NEUROLOGIC: Cranial nerves II through XII are intact. Muscle strength 5/5 in all extremities. Sensation intact. Gait not checked.  PSYCHIATRIC: The patient is alert and oriented x 3.  SKIN: No obvious rash, lesion, or ulcer.   LABORATORY PANEL:   CBC  Recent Labs Lab 12/02/14 1220  WBC 8.9  HGB 14.5  HCT 43.2  PLT 372  MCV 89.4  MCH 30.0  MCHC 33.5  RDW 16.1*   ------------------------------------------------------------------------------------------------------------------  Chemistries   Recent Labs Lab 12/02/14 1220  NA 138  K 3.4*  CL 103  CO2 26  GLUCOSE 129*  BUN 15  CREATININE 0.90  CALCIUM 9.8  AST 1092*  ALT 478*  ALKPHOS 129*  BILITOT 0.9   ------------------------------------------------------------------------------------------------------------------ estimated creatinine clearance is 74.9 mL/min (by C-G formula based on Cr of 0.9). ------------------------------------------------------------------------------------------------------------------ No results for input(s): TSH, T4TOTAL, T3FREE, THYROIDAB in the last 72 hours.  Invalid input(s): FREET3  Coagulation profile  Recent Labs Lab 12/02/14 1220  INR 0.99   ------------------------------------------------------------------------------------------------------------------- No results for input(s): DDIMER in the last 72 hours. -------------------------------------------------------------------------------------------------------------------  Cardiac Enzymes  Recent Labs Lab 12/02/14 1220 12/02/14 1545  TROPONINI <0.03 <0.03   ------------------------------------------------------------------------------------------------------------------ Invalid input(s): POCBNP  ---------------------------------------------------------------------------------------------------------------  Urinalysis    Component Value Date/Time   COLORURINE AMBER*  09/05/2014 1515   COLORURINE Yellow 03/15/2014 1416   APPEARANCEUR CLEAR* 09/05/2014 1515   APPEARANCEUR Clear 03/15/2014 1416   LABSPEC 1.033* 09/05/2014 1515   LABSPEC 1.010 03/15/2014 1416   PHURINE 5.0 09/05/2014 1515   PHURINE 5.0 03/15/2014 1416   GLUCOSEU NEGATIVE 09/05/2014 1515   GLUCOSEU Negative 03/15/2014 1416   HGBUR NEGATIVE 09/05/2014 1515   HGBUR Negative 03/15/2014 1416   BILIRUBINUR 1+* 09/05/2014 1515   BILIRUBINUR Negative 03/15/2014 1416   KETONESUR TRACE* 09/05/2014 1515   KETONESUR Negative 03/15/2014 1416   PROTEINUR >500* 09/05/2014 1515   PROTEINUR Negative 03/15/2014 1416   NITRITE NEGATIVE 09/05/2014 1515   NITRITE Negative 03/15/2014 1416   LEUKOCYTESUR NEGATIVE 09/05/2014 1515   LEUKOCYTESUR Trace 03/15/2014 1416     RADIOLOGY: Dg Chest 2 View  12/02/2014   CLINICAL DATA:  Weakness, epigastric pain, smoker  EXAM: CHEST  2 VIEW  COMPARISON:  03/15/2014  FINDINGS: Normal heart size, mediastinal contours and pulmonary vascularity.  Minimal scarring lateral lower RIGHT chest.  Lungs otherwise clear.  No pleural effusion or pneumothorax.  Bones unremarkable.  IMPRESSION: Minimal RIGHT lung scarring.  No acute abnormalities.   Electronically Signed   By: Ulyses Southward M.D.   On: 12/02/2014 12:47   Ct Head Wo Contrast  12/02/2014   CLINICAL DATA:  Pt is a very poor historian. Per pt chart "Patient presents to the ED with centralized chest pain since 5am this morning. Patient reports vomiting greater than 5 times since 5am. Patient is in obvious discomfort. Patient reports a history of high blood pressure. Patient denies shortness of breath. Denies diaphoresis. Patient is also complaining of a headache that began after patient skipped her morning bp medicaitons due to her nausea". Pt with documented hx of stroke, high blood pressure, and blood clots.  EXAM: CT HEAD WITHOUT CONTRAST  TECHNIQUE: Contiguous axial images were obtained from the base of the skull through the  vertex without intravenous contrast.  COMPARISON:  09/15/2012  FINDINGS: Ventricles are normal in size in configuration. There are no parenchymal masses or mass effect. There is no evidence of a recent cortical infarct. Well-defined hypoattenuation is noted in the right pons consistent with an old lacune infarct. There are no extra-axial masses or abnormal fluid collections.  There is no intracranial hemorrhage.  Visualized sinuses and mastoid air cells are clear.  IMPRESSION: 1. No acute intracranial abnormalities.   Electronically Signed   By: Amie Portland M.D.   On: 12/02/2014 14:38   US Abdomen Limited Ruq  12/02/2014   CLINICAL DATA:  Elevated LFTs  EXAM: US ABDOMEN LIMITED - RIGHT UPPER QUADRANT  COMPARISON:  09/05/2014  FINDINGS: Gallbladder:  Mild gallbladder wall thickening/edema. No gallstones. Negative sonographic Murphy's sign.  Common bile duct:  Diameter: 4 mm  Liver:  No focal lesion identified. At the upper limits of normal for parenchymal echogenicity.  IMPRESSION: Mild gallbladder wall thickening, but without secondary findings to suggest acute cholecystitis.  Given the LFTs, this may be secondary to a right upper quadrant inflammatory process such as hepatitis.   Electronically Signed   By: Charline Bills M.D.   On:  12/02/2014 15:48    EKG: Orders placed or performed during the hospital encounter of 12/02/14  . ED EKG within 10 minutes  . ED EKG within 10 minutes    IMPRESSION AND PLAN:  patient is a 59 year old presents with headache and chest pain 1. Hypertensive emergency: At this time will go ahead and start patient on the cardene drip. I will also resume her home medications.  2. Chest pain suspect due to accelerated hypertension; we'll monitor her cardiac enzymes. Echocardiogram of the heart and cardiology evaluation  3. Headache suspected due to accelerated hypertension we'll control her blood pressure  4. Elevated LFTs: Ultrasound of the liver shows no significant  abnormality, although gallbladder is abnormal there is no evidence of cholecystitis on examination, we'll check a hepatitis panel ask GI to see I will hold her cholesterol medication  5. Peripheral vascular disease and history of stroke aspirin daily  6. Nicotine addiction smoking cessation: 4 minutes spent strongly recommended she stop smoking she'll be started on nicotine patch while here   All the records are reviewed and case discussed with ED provider. Management plans discussed with the patient, family and they are in agreement.  CODE STATUS:Full     TOTAL TIME TAKING CARE OF THIS PATIENT: 55 of critical care time spent patient will be nicardepen drip    Auburn Bilberry M.D on 12/02/2014 at 8:05 PM  Between 7am to 6pm - Pager - [phone]  After 6pm go to [email or web address] - password [email or web address] [facility]  [facility]  Office  [phone]  CC: Primary care physician; No PCP Per Patient

## 2014-12-02 NOTE — ED Notes (Addendum)
Patient presents to the ED with centralized chest pain since 5am this morning.  Patient reports vomiting greater than 5 times since 5am.  Patient is in obvious discomfort.  Patient reports a history of high blood pressure.  Patient denies shortness of breath.  Denies diaphoresis.  Patient is also complaining of a headache that began after patient skipped her morning bp medicaitons due to her nausea.

## 2014-12-02 NOTE — ED Provider Notes (Signed)
[facility] Emergency Department Provider Note  ____________________________________________  Time seen: Approximately 1245 PM  I have reviewed the triage vital signs and the nursing notes.   HISTORY  Chief Complaint Chest Pain    HPI Kimberly Mccarthy is a 59 y.o. female with a history of hypertension renal failure and peripheral vascular disease who presents today with chest pain that started at 5 AM. She said she woke up with the pain which was severe and in the center of her chest. She is not able to characterize the pain but says he did not feel like a burning sensation. It was not radiating. It is associated with multiple episodes of vomiting. Also with 2 episodes of diarrhea. No blood in the vomit or the stool. Said that her sister also is having diarrhea yesterday. No recent travel or antibiotic usage. No diaphoresis. Says she now has a posterior headache which he thinks is from not taking her morning medications secondary to nausea. She says when her blood pressure gets high she usually gets headaches like this.She says the chest pain is now minimal. She says she thinks this may be reflux which she has a history of. Past Medical History  Diagnosis Date  . Stroke     may 2015  . Hypertension   . Thyroid disease   . H/O blood clots   . Anemia   . AVM (arteriovenous malformation)   . Anemia   . Renal failure   . PVD (peripheral vascular disease)   . Hyperkalemia   . Carotid artery stenosis     Patient Active Problem List   Diagnosis Date Noted  . Accelerated hypertension 09/05/2014  . Hypertension 09/05/2014  . Gastritis without bleeding 09/05/2014  . Elevated troponin 09/05/2014  . AKI (acute kidney injury) 09/05/2014  . Hypothyroidism 09/05/2014    Past Surgical History  Procedure Laterality Date  . Left foot surgery  02/2014  . Leg surgery Left     Current Outpatient Rx  Name  Route  Sig  Dispense  Refill  . acetaminophen (TYLENOL)  325 MG tablet   Oral   Take 2 tablets (650 mg total) by mouth every 6 (six) hours as needed for mild pain (or Fever >/= 101).         Marland Kitchen amLODipine (NORVASC) 10 MG tablet   Oral   Take 10 mg by mouth daily.         Marland Kitchen aspirin EC 81 MG tablet   Oral   Take 81 mg by mouth daily.         . carvedilol (COREG) 25 MG tablet   Oral   Take 25 mg by mouth 2 (two) times daily with a meal.         . cloNIDine (CATAPRES) 0.3 MG tablet   Oral   Take 0.3 mg by mouth 2 (two) times daily.         Marland Kitchen docusate sodium (COLACE) 100 MG capsule   Oral   Take 100 mg by mouth 2 (two) times daily.         . ferrous sulfate 325 (65 FE) MG tablet   Oral   Take 325 mg by mouth daily with breakfast.         . gabapentin (NEURONTIN) 300 MG capsule   Oral   Take 300 mg by mouth 3 (three) times daily.         . hydrALAZINE (APRESOLINE) 25 MG tablet   Oral  Take 1 tablet (25 mg total) by mouth every 8 (eight) hours.   90 tablet   0   . hydrochlorothiazide (MICROZIDE) 12.5 MG capsule   Oral   Take 12.5 mg by mouth daily.         Marland Kitchen HYDROcodone-acetaminophen (NORCO) 10-325 MG per tablet   Oral   Take 0.5-1 tablets by mouth every 6 (six) hours as needed for severe pain.   20 tablet   0   . levothyroxine (SYNTHROID, LEVOTHROID) 50 MCG tablet   Oral   Take 50 mcg by mouth daily.         . Multiple Vitamin (MULTIVITAMIN WITH MINERALS) TABS tablet   Oral   Take 1 tablet by mouth daily.         . ondansetron (ZOFRAN) 4 MG tablet   Oral   Take 1 tablet (4 mg total) by mouth every 8 (eight) hours as needed for nausea or vomiting.   30 tablet   1   . pantoprazole (PROTONIX) 40 MG tablet   Oral   Take 40 mg by mouth 2 (two) times daily.         . pravastatin (PRAVACHOL) 10 MG tablet   Oral   Take 10 mg by mouth at bedtime.         . sucralfate (CARAFATE) 1 G tablet   Oral   Take 1 g by mouth 3 (three) times daily.           Allergies Review of patient's  allergies indicates no known allergies.  Family History  Problem Relation Age of Onset  . Diabetes Mother   . Hypertension Mother   . Hypertension Sister   . Thyroid disease Sister     Social History History  Substance Use Topics  . Smoking status: Current Every Day Smoker -- 1.00 packs/day for 20 years  . Smokeless tobacco: Not on file  . Alcohol Use: No    Review of Systems Constitutional: No fever/chills Eyes: No visual changes. ENT: No sore throat. Cardiovascular: As above  Respiratory: Denies shortness of breath. Gastrointestinal: No abdominal pain.   No constipation. Genitourinary: Negative for dysuria. Musculoskeletal: Negative for back pain. Skin: Negative for rash. Neurological: Negative for headaches, focal weakness or numbness.  10-point ROS otherwise negative.  ____________________________________________   PHYSICAL EXAM:  VITAL SIGNS: ED Triage Vitals  Enc Vitals Group     BP 12/02/14 1207 209/95 mmHg     Pulse Rate 12/02/14 1207 76     Resp 12/02/14 1207 24     Temp 12/02/14 1207 98.9 F (37.2 C)     Temp Source 12/02/14 1207 Oral     SpO2 12/02/14 1207 100 %     Weight 12/02/14 1207 180 lb (81.647 kg)     Height 12/02/14 1207 5\' 7"  (1.702 m)     Head Cir --      Peak Flow --      Pain Score 12/02/14 1210 10     Pain Loc --      Pain Edu? --      Excl. in GC? --     Constitutional: Alert and oriented. Well appearing and in no acute distress. Eyes: Conjunctivae are normal. PERRL. EOMI. Head: Atraumatic. Nose: No congestion/rhinnorhea. Mouth/Throat: Mucous membranes are moist.  Oropharynx non-erythematous. Neck: No stridor.   Cardiovascular: Normal rate, regular rhythm. Grossly normal heart sounds.  Good peripheral circulation. Respiratory: Normal respiratory effort.  No retractions. Lungs CTAB. Gastrointestinal: Soft and nontender. No  distention. No abdominal bruits. No CVA tenderness. Musculoskeletal: No lower extremity tenderness nor  edema.  No joint effusions. Neurologic:  Normal speech and language. No gross focal neurologic deficits are appreciated. No gait instability. Skin:  Skin is warm, dry and intact. No rash noted. Psychiatric: Mood and affect are normal. Speech and behavior are normal.  ____________________________________________   LABS (all labs ordered are listed, but only abnormal results are displayed)  Labs Reviewed  BASIC METABOLIC PANEL - Abnormal; Notable for the following:    Potassium 3.4 (*)    Glucose, Bld 129 (*)    All other components within normal limits  CBC - Abnormal; Notable for the following:    RDW 16.1 (*)    All other components within normal limits  HEPATIC FUNCTION PANEL - Abnormal; Notable for the following:    Total Protein 8.4 (*)    AST 1092 (*)    ALT 478 (*)    Alkaline Phosphatase 129 (*)    All other components within normal limits  ACETAMINOPHEN LEVEL - Abnormal; Notable for the following:    Acetaminophen (Tylenol), Serum <10 (*)    All other components within normal limits  TROPONIN I  LIPASE, BLOOD  TROPONIN I  PROTIME-INR  ETHANOL   ____________________________________________  EKG  ED ECG REPORT I, Arelia Longest, the attending physician, personally viewed and interpreted this ECG.   Date: 12/02/2014  EKG Time: 1157  Rate: 73  Rhythm: normal sinus rhythm  Axis: Normal axis  Intervals:none  ST&T Change: No ST elevations or depressions. No abnormal T-wave inversions. Minimal voltage criteria for LVH which may be a normal variant.  ____________________________________________  RADIOLOGY  No acute abnormality on chest radiography. I personally reviewed these images.  CAT scan of the brain without acute findings. Ultrasound of the gallbladder unlikely to be cholecystitis. ____________________________________________   PROCEDURES    ____________________________________________   INITIAL IMPRESSION / ASSESSMENT AND PLAN / ED  COURSE  Pertinent labs & imaging results that were available during my care of the patient were reviewed by me and considered in my medical decision making (see chart for details). ----------------------------------------- 7:48 PM on 12/02/2014 -----------------------------------------  Patient with multiple rounds of IV blood pressure medications as well as IV pain medications without resolution of blood pressure or headache. Unclear cause for elevation of the liver enzymes. Patient denies any drinking. Negative Tylenol level. No history of IV drug use. No known history of hepatitis. We'll admit for hypertensive urgency. Signed out to Dr. Allena Katz  Dr. Allena Katz aware of liver enzyme elevation.   ____________________________________________   FINAL CLINICAL IMPRESSION(S) / ED DIAGNOSES  Acute hypertensive urgency. Acute transaminitis. Initial visit.     Myrna Blazer, MD 12/02/14 [phone]

## 2014-12-03 ENCOUNTER — Inpatient Hospital Stay
Admit: 2014-12-03 | Discharge: 2014-12-03 | Disposition: A | Discharge status: Home / Self Care | Payer: Medicaid Other | Attending: Internal Medicine | Admitting: Internal Medicine

## 2014-12-03 DIAGNOSIS — R1013 Epigastric pain: Secondary | ICD-10-CM

## 2014-12-03 DIAGNOSIS — R748 Abnormal levels of other serum enzymes: Secondary | ICD-10-CM

## 2014-12-03 LAB — TROPONIN I
TROPONIN I: 0.08 ng/mL — AB (ref <0.031)
TROPONIN I: 0.08 ng/mL — AB (ref <0.031)
TROPONIN I: 0.04 ng/mL — AB (ref <0.031)

## 2014-12-03 LAB — BASIC METABOLIC PANEL
Anion gap: 8 (ref 5–15)
BUN: 14 mg/dL (ref 6–20)
CO2: 26 mmol/L (ref 22–32)
Calcium: 9 mg/dL (ref 8.9–10.3)
Chloride: 107 mmol/L (ref 101–111)
Creatinine, Ser: 0.94 mg/dL (ref 0.44–1.00)
GFR calc Af Amer: >60 mL/min (ref >60)
Glucose, Bld: 129 mg/dL — ABNORMAL HIGH (ref 65–99)
Potassium: 3.2 mmol/L — ABNORMAL LOW (ref 3.5–5.1)
Sodium: 141 mmol/L (ref 135–145)

## 2014-12-03 LAB — CBC
HCT: 36.8 % (ref 35.0–47.0)
Hemoglobin: 12.5 g/dL (ref 12.0–16.0)
MCH: 30.5 pg (ref 26.0–34.0)
MCHC: 33.9 g/dL (ref 32.0–36.0)
MCV: 89.9 fL (ref 80.0–100.0)
PLATELETS: 322 10*3/uL (ref 150–440)
RBC: 4.09 MIL/uL (ref 3.80–5.20)
RDW: 16.1 % — AB (ref 11.5–14.5)
WBC: 7.6 10*3/uL (ref 3.6–11.0)

## 2014-12-03 LAB — URINE DRUG SCREEN, QUALITATIVE (ARMC ONLY)
Amphetamines, Ur Screen: NOT DETECTED
BARBITURATES, UR SCREEN: NOT DETECTED
BENZODIAZEPINE, UR SCRN: NOT DETECTED
COCAINE METABOLITE, UR ~~LOC~~: NOT DETECTED
Cannabinoid 50 Ng, Ur ~~LOC~~: NOT DETECTED
MDMA (ECSTASY) UR SCREEN: NOT DETECTED
METHADONE SCREEN, URINE: NOT DETECTED
Opiate, Ur Screen: POSITIVE — AB
Phencyclidine (PCP) Ur S: NOT DETECTED
Tricyclic, Ur Screen: NOT DETECTED

## 2014-12-03 LAB — ACETAMINOPHEN LEVEL

## 2014-12-03 MED ORDER — POTASSIUM CHLORIDE 20 MEQ PO PACK
40.0000 meq | PACK | Freq: Once | ORAL | Status: AC
  Administered 2014-12-03: 40 meq via ORAL
  Filled 2014-12-03: qty 2

## 2014-12-03 MED ORDER — TRAMADOL HCL 50 MG PO TABS
50.0000 mg | ORAL_TABLET | Freq: Four times a day (QID) | ORAL | Status: DC | PRN
  Administered 2014-12-03 – 2014-12-04 (×2): 50 mg via ORAL
  Filled 2014-12-03 (×2): qty 1

## 2014-12-03 MED ORDER — GABAPENTIN 400 MG PO CAPS
400.0000 mg | ORAL_CAPSULE | Freq: Three times a day (TID) | ORAL | Status: DC
  Administered 2014-12-03 – 2014-12-04 (×4): 400 mg via ORAL
  Filled 2014-12-03 (×4): qty 1

## 2014-12-03 NOTE — Progress Notes (Signed)
[facility] - [place] at [facility]   PATIENT NAME: Kimberly Mccarthy    MR#:  [id number]  DATE OF BIRTH:  1956-03-09  SUBJECTIVE:  CHIEF COMPLAINT:   Chief Complaint  Patient presents with  . Chest Pain   Patient is 59 year old African-American female with history of stroke, hypertension, peripheral vascular disease status post recent vascular procedures, resulting in significant neuropathy in lower extremities,  Also CK D,  nicotine addiction who presents to the hospital with nausea, vomiting, chest pains, abdominal pains, diarrhea. She was noted to have elevated blood pressure in 200s and was started on IV medications and admitted to intensive care unit. She also admitted of not taking her blood pressure medications for one day because of nausea, vomiting and not feeling well. Now her blood pressure medications were reinitiated and her blood pressure is back to normal and she is off IV drip. She feels better today. Denies any significant pains, although admits of some discomfort and upper abdomen and not feeling hungry. No more diarrhea, no nausea, vomiting. Patient's liver enzymes were found to be abnormal, but patient's family. Admits of patient using multiple doses of Tylenol.     Review of Systems  Constitutional: Negative for fever, chills and weight loss.  HENT: Negative for congestion.   Eyes: Negative for blurred vision and double vision.  Respiratory: Negative for cough, sputum production, shortness of breath and wheezing.   Cardiovascular: Negative for chest pain, palpitations, orthopnea, leg swelling and PND.  Gastrointestinal: Positive for nausea, abdominal pain and diarrhea. Negative for vomiting, constipation and blood in stool.  Genitourinary: Negative for dysuria, urgency, frequency and hematuria.  Musculoskeletal: Negative for falls.  Neurological: Negative for dizziness, tremors, focal weakness and headaches.  Endo/Heme/Allergies: Does not  bruise/bleed easily.  Psychiatric/Behavioral: Negative for depression. The patient does not have insomnia.     VITAL SIGNS: Blood pressure 96/59, pulse 64, temperature 98.6 F (37 C), temperature source Oral, resp. rate 19, height 5\' 7"  (1.702 m), weight 76.1 kg (167 lb 12.3 oz), SpO2 100 %.  PHYSICAL EXAMINATION:   GENERAL:  59 y.o.-year-old patient lying in the bed with no acute distress.  EYES: Pupils equal, round, reactive to light and accommodation. No scleral icterus. Extraocular muscles intact.  HEENT: Head atraumatic, normocephalic. Oropharynx and nasopharynx clear.  NECK:  Supple, no jugular venous distention. No thyroid enlargement, no tenderness.  LUNGS: Normal breath sounds bilaterally, no wheezing, rales,rhonchi or crepitation. No use of accessory muscles of respiration.  CARDIOVASCULAR: S1, S2 normal. No murmurs, rubs, or gallops.  ABDOMEN: Soft, tender in the upper abdomen but no rebound or guarding. Where noted some discomfort diffusely in lower abdomen as well as periumbilically as well but no areas of discrete pain was noted on palpation or percussion, nondistended. Bowel sounds present. No organomegaly or mass.  EXTREMITIES: No pedal edema, cyanosis, or clubbing.  NEUROLOGIC: Cranial nerves II through XII are intact. Muscle strength 5/5 in all extremities. Sensation intact. Gait not checked.  PSYCHIATRIC: The patient is alert and oriented x 3.  SKIN: No obvious rash, lesion, or ulcer. Significant scarring in left lower extremity after procedures. Patient does have necrosis of her left great toe distal phalanx. Scarring in the groin, right and left.  Significant sensitivity to light touch in both lower extremities. Peripheral pulses are poorly palpable. However, patient's feet are warm.   ORDERS/RESULTS REVIEWED:   CBC  Recent Labs Lab 12/02/14 1220 12/03/14 0313  WBC 8.9 7.6  HGB 14.5 12.5  HCT  43.2 36.8  PLT 372 322  MCV 89.4 89.9  MCH 30.0 30.5  MCHC 33.5 33.9   RDW 16.1* 16.1*   ------------------------------------------------------------------------------------------------------------------  Chemistries   Recent Labs Lab 12/02/14 1220 12/02/14 2206 12/03/14 0313  NA 138  --  141  K 3.4*  --  3.2*  CL 103  --  107  CO2 26  --  26  GLUCOSE 129*  --  129*  BUN 15  --  14  CREATININE 0.90  --  0.94  CALCIUM 9.8  --  9.0  AST 1092* 712*  --   ALT 478* 600*  --   ALKPHOS 129* 147*  --   BILITOT 0.9 0.7  --    ------------------------------------------------------------------------------------------------------------------ estimated creatinine clearance is 69.4 mL/min (by C-G formula based on Cr of 0.94). ------------------------------------------------------------------------------------------------------------------  Recent Labs  12/02/14 1220  TSH 0.544    Cardiac Enzymes  Recent Labs Lab 12/02/14 2206 12/03/14 0313 12/03/14 0917  TROPONINI 0.08* 0.08* 0.04*   ------------------------------------------------------------------------------------------------------------------ Invalid input(s): POCBNP ---------------------------------------------------------------------------------------------------------------  RADIOLOGY: Dg Chest 2 View  12/02/2014   CLINICAL DATA:  Weakness, epigastric pain, smoker  EXAM: CHEST  2 VIEW  COMPARISON:  03/15/2014  FINDINGS: Normal heart size, mediastinal contours and pulmonary vascularity.  Minimal scarring lateral lower RIGHT chest.  Lungs otherwise clear.  No pleural effusion or pneumothorax.  Bones unremarkable.  IMPRESSION: Minimal RIGHT lung scarring.  No acute abnormalities.   Electronically Signed   By: Ulyses Southward M.D.   On: 12/02/2014 12:47   Ct Head Wo Contrast  12/02/2014   CLINICAL DATA:  Pt is a very poor historian. Per pt chart "Patient presents to the ED with centralized chest pain since 5am this morning. Patient reports vomiting greater than 5 times since 5am. Patient is in  obvious discomfort. Patient reports a history of high blood pressure. Patient denies shortness of breath. Denies diaphoresis. Patient is also complaining of a headache that began after patient skipped her morning bp medicaitons due to her nausea". Pt with documented hx of stroke, high blood pressure, and blood clots.  EXAM: CT HEAD WITHOUT CONTRAST  TECHNIQUE: Contiguous axial images were obtained from the base of the skull through the vertex without intravenous contrast.  COMPARISON:  09/15/2012  FINDINGS: Ventricles are normal in size in configuration. There are no parenchymal masses or mass effect. There is no evidence of a recent cortical infarct. Well-defined hypoattenuation is noted in the right pons consistent with an old lacune infarct. There are no extra-axial masses or abnormal fluid collections.  There is no intracranial hemorrhage.  Visualized sinuses and mastoid air cells are clear.  IMPRESSION: 1. No acute intracranial abnormalities.   Electronically Signed   By: Amie Portland M.D.   On: 12/02/2014 14:38   US Abdomen Limited Ruq  12/02/2014   CLINICAL DATA:  Elevated LFTs  EXAM: US ABDOMEN LIMITED - RIGHT UPPER QUADRANT  COMPARISON:  09/05/2014  FINDINGS: Gallbladder:  Mild gallbladder wall thickening/edema. No gallstones. Negative sonographic Murphy's sign.  Common bile duct:  Diameter: 4 mm  Liver:  No focal lesion identified. At the upper limits of normal for parenchymal echogenicity.  IMPRESSION: Mild gallbladder wall thickening, but without secondary findings to suggest acute cholecystitis.  Given the LFTs, this may be secondary to a right upper quadrant inflammatory process such as hepatitis.   Electronically Signed   By: Charline Bills M.D.   On: 12/02/2014 15:48    EKG:  Orders placed or performed during the hospital encounter  of 12/02/14  . ED EKG within 10 minutes  . ED EKG within 10 minutes    ASSESSMENT AND PLAN:  Active Problems:   HTN (hypertension), malignant 1.  Malignant essential hypertension. Continue patient's outpatient medications .  2. Elevated troponin, likely demand ischemia. However, cannot rule out underlying coronary artery disease. Patient will be consulted by cardiologist and will continue aspirin as well as Coreg. Patient may benefit from stress test as outpatient or inpatient 3. Elevated transaminases of unclear etiology, could be due to Tylenol. Get Tylenol level. Discontinue Tylenol and get gastroenterology consultation for further recommendations. Get acute hepatitis panel 4. Acute gastritis or gastroenteritis,  symptoms are resolving with conservative therapy , continue PPI. We will get stool cultures if diarrhea recurs.  5. Peripheral neuropathy since patient tramadol, gabapentin   Management plans discussed with the patient, family and they are in agreement.   DRUG ALLERGIES: No Known Allergies  CODE STATUS:     Code Status Orders        Start     Ordered   12/02/14 2134  Full code   Continuous     12/02/14 2133      TOTAL TIME TAKING CARE OF THIS PATIENT: 40 minutes.    Katharina Caper M.D on 12/03/2014 at 12:29 PM  Between 7am to 6pm - Pager - [phone]  After 6pm go to [email or web address] - password [email or web address] [facility]  [place] [facility]  Office  [phone]  CC: Primary care physician; No PCP Per Patient

## 2014-12-03 NOTE — Progress Notes (Signed)
*  PRELIMINARY RESULTS* Echocardiogram 2D Echocardiogram has been performed.  Kimberly Mccarthy 12/03/2014, 1:51 PM

## 2014-12-03 NOTE — Progress Notes (Signed)
RN spoke with Dr. Cherlynn Kaiser and made MD aware that patient has only had 150cc of urine output this shift and that patient is drinking and eating.  Bladder scan result after void was 30ml. Dr. Cherlynn Kaiser stated "I dont know what you want me to do, she's drinking and peeing and her kidney function is fine." no new orders.

## 2014-12-03 NOTE — Progress Notes (Signed)
Report called to Tanya, RN on 2A. 

## 2014-12-03 NOTE — Progress Notes (Signed)
A&Ox4. No complaint of chest pain during shift. VSS. Tolerating diet. Patient moved to room 248 by bed with Diannia Ruder, NT with 2A tele monitor on that was verified with Agustin Cree, tele clerk.

## 2014-12-03 NOTE — Consult Note (Signed)
[facility] Cardiology Consultation Note  Patient ID: Kimberly Mccarthy, MRN: [id number], DOB/AGE: [id number] y.o. Admit date: 12/02/2014   Date of Consult: 12/03/2014 Primary Physician: No PCP Per Patient Primary Cardiologist: Gwen Pounds  Chief Complaint:  Chief Complaint  Patient presents with  . Chest Pain   Reason for Consult: able angina  HPI: 59 y.o. female with known previous peripheral vascular disease old cerebrovascular accident and previous lower extremity intervention with continued claudication-type symptoms essential hypertension chronic kidney disease hyperlipidemia on appropriate medication management over the last many months with a normal heart function with ejection fraction of 55% by echocardiogram in recent months having severe substernal chest discomfort after being told that one of her family members had or best friend was dying. The patient has severe substernal chest discomfort lasting for several hours with EKG showing normal sinus rhythm and no other EKG changes. This resolved on its own without evidence of significant myocardial infarction with troponin of 0.08. The patient feels much better at this time and there is appears to be no other symptoms. Blood pressure is very well controlled on current medical regimen including carvedilol amlodipine clonidine and lisinopril. The patient has had some lower extremity edema currently stable on hydrochlorothiazide. There has been no worsening claudication or congestive heart failure type symptoms at this time.  Past Medical History  Diagnosis Date  . Stroke     may 2015  . Hypertension   . Thyroid disease   . H/O blood clots   . Anemia   . AVM (arteriovenous malformation)   . Anemia   . Renal failure   . PVD (peripheral vascular disease)   . Hyperkalemia   . Carotid artery stenosis       Surgical History:  Past Surgical History  Procedure Laterality Date  . Left foot surgery  02/2014  . Leg surgery Left   .  Femoral endarterectomy Right      Home Meds: Prior to Admission medications   Medication Sig Start Date End Date Taking? Authorizing Provider  acetaminophen (TYLENOL) 325 MG tablet Take 2 tablets (650 mg total) by mouth every 6 (six) hours as needed for mild pain (or Fever >/= 101). 09/07/14  Yes Ramonita Lab, MD  amLODipine (NORVASC) 10 MG tablet Take 10 mg by mouth daily.   Yes Historical Provider, MD  carvedilol (COREG) 25 MG tablet Take 25 mg by mouth 2 (two) times daily with a meal.   Yes Historical Provider, MD  cloNIDine (CATAPRES) 0.3 MG tablet Take 0.3 mg by mouth 2 (two) times daily.   Yes Historical Provider, MD  docusate sodium (COLACE) 100 MG capsule Take 100 mg by mouth 2 (two) times daily.   Yes Historical Provider, MD  ferrous sulfate 325 (65 FE) MG tablet Take 325 mg by mouth daily with breakfast.   Yes Historical Provider, MD  gabapentin (NEURONTIN) 300 MG capsule Take 300 mg by mouth 3 (three) times daily.   Yes Historical Provider, MD  hydrALAZINE (APRESOLINE) 25 MG tablet Take 1 tablet (25 mg total) by mouth every 8 (eight) hours. 09/07/14  Yes Ramonita Lab, MD  hydrochlorothiazide (MICROZIDE) 12.5 MG capsule Take 12.5 mg by mouth daily.   Yes Historical Provider, MD  HYDROcodone-acetaminophen (NORCO) 10-325 MG per tablet Take 0.5-1 tablets by mouth every 6 (six) hours as needed for severe pain. 09/07/14  Yes Ramonita Lab, MD  levothyroxine (SYNTHROID, LEVOTHROID) 50 MCG tablet Take 50 mcg by mouth daily.   Yes Historical Provider, MD  lisinopril (PRINIVIL,ZESTRIL) 40 MG tablet Take 40 mg by mouth daily.   Yes Historical Provider, MD  Multiple Vitamin (MULTIVITAMIN WITH MINERALS) TABS tablet Take 1 tablet by mouth daily.   Yes Historical Provider, MD  pantoprazole (PROTONIX) 40 MG tablet Take 40 mg by mouth 2 (two) times daily.   Yes Historical Provider, MD  pravastatin (PRAVACHOL) 10 MG tablet Take 10 mg by mouth at bedtime.   Yes Historical Provider, MD  sucralfate (CARAFATE) 1  G tablet Take 1 g by mouth 3 (three) times daily.   Yes Historical Provider, MD  ondansetron (ZOFRAN) 4 MG tablet Take 1 tablet (4 mg total) by mouth every 8 (eight) hours as needed for nausea or vomiting. 09/02/14 09/02/15  Phineas Semen, MD    Inpatient Medications:  . amLODipine  10 mg Oral Daily  . aspirin  81 mg Oral Daily  . carvedilol  25 mg Oral BID WC  . cloNIDine  0.3 mg Oral BID  . docusate sodium  100 mg Oral BID  . ferrous sulfate  325 mg Oral Q breakfast  . gabapentin  400 mg Oral TID  . gi cocktail  30 mL Oral Once  . heparin  5,000 Units Subcutaneous 3 times per day  . hydrALAZINE  25 mg Oral 3 times per day  . hydrochlorothiazide  12.5 mg Oral Daily  . levothyroxine  50 mcg Oral QAC breakfast  . lisinopril  40 mg Oral Daily  . multivitamin with minerals  1 tablet Oral Daily  . nicotine  21 mg Transdermal Daily  . pantoprazole  40 mg Oral BID  . potassium chloride  40 mEq Oral Once  . sucralfate  1 g Oral TID      Allergies: No Known Allergies  History   Social History  . Marital Status: Single    Spouse Name: N/A  . Number of Children: N/A  . Years of Education: N/A   Occupational History  . Not on file.   Social History Main Topics  . Smoking status: Current Every Day Smoker -- 1.00 packs/day for 20 years  . Smokeless tobacco: Not on file  . Alcohol Use: No  . Drug Use: No  . Sexual Activity: Not on file   Other Topics Concern  . Not on file   Social History Narrative     Family History  Problem Relation Age of Onset  . Diabetes Mother   . Hypertension Mother   . Hypertension Sister   . Thyroid disease Sister      Review of Systems Positive for chest discomfort Negative for: General:  chills, fever, night sweats or weight changes.  Cardiovascular: PND orthopnea syncope dizziness  Dermatological skin lesions rashes Respiratory: Cough congestion Urologic: Frequent urination urination at night and hematuria Abdominal: negative for  nausea, vomiting, diarrhea, bright red blood per rectum, melena, or hematemesis Neurologic: negative for visual changes, and/or hearing changes  All other systems reviewed and are otherwise negative except as noted above.  Labs:  Recent Labs  12/02/14 1545 12/02/14 2206 12/03/14 0313 12/03/14 0917  TROPONINI <0.03 0.08* 0.08* 0.04*   Lab Results  Component Value Date   WBC 7.6 12/03/2014   HGB 12.5 12/03/2014   HCT 36.8 12/03/2014   MCV 89.9 12/03/2014   PLT 322 12/03/2014    Recent Labs Lab 12/02/14 2206 12/03/14 0313  NA  --  141  K  --  3.2*  CL  --  107  CO2  --  26  BUN  --  14  CREATININE  --  0.94  CALCIUM  --  9.0  PROT 8.2*  --   BILITOT 0.7  --   ALKPHOS 147*  --   ALT 600*  --   AST 712*  --   GLUCOSE  --  129*   Lab Results  Component Value Date   CHOL 215* 09/16/2012   HDL 56 09/16/2012   LDLCALC 138* 09/16/2012   TRIG 105 09/16/2012   No results found for: DDIMER  Radiology/Studies:  Dg Chest 2 View  12/02/2014   CLINICAL DATA:  Weakness, epigastric pain, smoker  EXAM: CHEST  2 VIEW  COMPARISON:  03/15/2014  FINDINGS: Normal heart size, mediastinal contours and pulmonary vascularity.  Minimal scarring lateral lower RIGHT chest.  Lungs otherwise clear.  No pleural effusion or pneumothorax.  Bones unremarkable.  IMPRESSION: Minimal RIGHT lung scarring.  No acute abnormalities.   Electronically Signed   By: Ulyses Southward M.D.   On: 12/02/2014 12:47   Ct Head Wo Contrast  12/02/2014   CLINICAL DATA:  Pt is a very poor historian. Per pt chart "Patient presents to the ED with centralized chest pain since 5am this morning. Patient reports vomiting greater than 5 times since 5am. Patient is in obvious discomfort. Patient reports a history of high blood pressure. Patient denies shortness of breath. Denies diaphoresis. Patient is also complaining of a headache that began after patient skipped her morning bp medicaitons due to her nausea". Pt with documented hx of  stroke, high blood pressure, and blood clots.  EXAM: CT HEAD WITHOUT CONTRAST  TECHNIQUE: Contiguous axial images were obtained from the base of the skull through the vertex without intravenous contrast.  COMPARISON:  09/15/2012  FINDINGS: Ventricles are normal in size in configuration. There are no parenchymal masses or mass effect. There is no evidence of a recent cortical infarct. Well-defined hypoattenuation is noted in the right pons consistent with an old lacune infarct. There are no extra-axial masses or abnormal fluid collections.  There is no intracranial hemorrhage.  Visualized sinuses and mastoid air cells are clear.  IMPRESSION: 1. No acute intracranial abnormalities.   Electronically Signed   By: Amie Portland M.D.   On: 12/02/2014 14:38   US Abdomen Limited Ruq  12/02/2014   CLINICAL DATA:  Elevated LFTs  EXAM: US ABDOMEN LIMITED - RIGHT UPPER QUADRANT  COMPARISON:  09/05/2014  FINDINGS: Gallbladder:  Mild gallbladder wall thickening/edema. No gallstones. Negative sonographic Murphy's sign.  Common bile duct:  Diameter: 4 mm  Liver:  No focal lesion identified. At the upper limits of normal for parenchymal echogenicity.  IMPRESSION: Mild gallbladder wall thickening, but without secondary findings to suggest acute cholecystitis.  Given the LFTs, this may be secondary to a right upper quadrant inflammatory process such as hepatitis.   Electronically Signed   By: Charline Bills M.D.   On: 12/02/2014 15:48    EKG: Normal sinus rhythm. Normal EKG  Weights: [facility] Weights   12/02/14 1207 12/02/14 2232  Weight: 180 lb (81.647 kg) 167 lb 12.3 oz (76.1 kg)     Physical Exam: Blood pressure 90/53, pulse 67, temperature 98.5 F (36.9 C), temperature source Oral, resp. rate 20, height 5\' 7"  (1.702 m), weight 167 lb 12.3 oz (76.1 kg), SpO2 100 %. Body mass index is 26.27 kg/(m^2). General: Well developed, well nourished, in no acute distress. Head eyes ears nose throat: Normocephalic,  atraumatic, sclera non-icteric, no xanthomas, nares are without discharge. No apparent  thyromegaly and/or mass  Lungs: Normal respiratory effort.  Diffuse wheezes, no rales, no rhonchi.  Heart: RRR with normal S1 S2. no murmur gallop, no rub, PMI is normal size and placement, carotid upstroke normal without bruit, jugular venous pressure is normal Abdomen: Soft, non-tender, non-distended with normoactive bowel sounds. No hepatomegaly. No rebound/guarding. No obvious abdominal masses. Abdominal aorta is normal size with bruit Extremities: Trace to 1+ edema. no cyanosis, no clubbing, positive ulcers  Peripheral : 2+ bilateral upper extremity pulses, 0+ bilateral femoral pulses, 0+ bilateral dorsal pedal pulse Neuro: Alert and oriented. No facial asymmetry. No focal deficit. Moves all extremities spontaneously. Musculoskeletal: Normal muscle tone without kyphosis Psych:  Responds to questions appropriately with a normal affect.    Assessment: 59 year old female with known essential hypertension chronic kidney disease stage III mixed hyperlipidemia severe peripheral vascular disease with old cerebrovascular accident and lower extremity intervention with normal LV systolic function and atypical chest discomfort currently without evidence of significant myocardial infarction  Plan: 1. Continue serial ECG and enzymes to assess for possible myocardial infarction 2. Echocardiogram if necessary if patient has recurrent chest discomfort. 3. Consider Lexiscan infusion stress test if patient has recurrent chest pain overnight although if ambulating reasonably well may discharged to home for further outpatient evaluation 4. Hypertension control with current medical regimen without change today 5. Further treatment of hyperlipidemia with high intensity cholesterol therapy 6. Recheck in morning at time of ambulation  Signed, Lamar Blinks M.D. [facility] Cardiology 12/03/2014, 5:29 PM

## 2014-12-03 NOTE — Progress Notes (Signed)
RN made Dr. Winona Legato aware that patient's systolic blood pressure has been in the 90's this afternoon and evening and RN asked MD for parameters to give blood pressure medicine by. Dr. Winona Legato stated "Hold all blood pressure medicine for systolic less than 130."

## 2014-12-03 NOTE — Progress Notes (Signed)
PT Cancellation Note  Patient Details Name: Kimberly Mccarthy MRN: [id number] DOB: 1955/12/28   Cancelled Treatment:    Reason Eval/Treat Not Completed: Patient declined, no reason specified (Asked to wait but has been up with stafff to [facility]).  Will try again tomorrow.   Ivar Drape 12/03/2014, 3:58 PM   Samul Dada, PT MS Acute Rehab Dept. Number: [facility] [id number] and [facility] [phone]

## 2014-12-03 NOTE — Progress Notes (Signed)
RN spoke with Dr. Winona Legato on the phone and made MD aware of potassium level of 3.2. MD gave order for of PO potassium once and for patient to be transferred to 2A.

## 2014-12-03 NOTE — Progress Notes (Signed)
Notified Dr. Anne Hahn of Cardene drip maxed out and pts BP is still above goal. Nitro drip ordered. Toponin of .08, will trend another three Troponin however if they go down he said to cancel them.

## 2014-12-03 NOTE — Consult Note (Signed)
[facility]  [address] [place], [place] [address] Phone: [phone] Fax : [phone]  Consultation  Referring Provider:     No ref. provider found Primary Care Physician:  No PCP Per Patient Primary Gastroenterologist:  None         Reason for Consultation:     Abnormal liver enzymes  Date of Admission:  12/02/2014 Date of Consultation:  12/03/2014         HPI:   Kimberly Mccarthy is a 59 y.o. female who comes in with nausea vomiting epigastric pain and was found to have high blood pressure. The patient was admitted to the ICU because the blood pressure. The patient states that she had a sharp pain in her epigastric area that was feeling better when she would massaged although would not go away. The patient continued to have these symptoms and was taking Tylenol for it. The patient also reports that she was taking times without any relief of her symptoms. The patient then came to the hospital after having some nausea and vomiting associated with the pain. The patient states the pain has greatly improved although it is still tender to palpation. The patient also was treated for her high blood pressure which has returned to normal. There is no report of any vomiting blood. She does have black stools because she takes iron. On admission she was found to have increased liver enzymes of over thousand. Today they have come down to 600.  Past Medical History  Diagnosis Date  . Stroke     may 2015  . Hypertension   . Thyroid disease   . H/O blood clots   . Anemia   . AVM (arteriovenous malformation)   . Anemia   . Renal failure   . PVD (peripheral vascular disease)   . Hyperkalemia   . Carotid artery stenosis     Past Surgical History  Procedure Laterality Date  . Left foot surgery  02/2014  . Leg surgery Left   . Femoral endarterectomy Right     Prior to Admission medications   Medication Sig Start Date End Date Taking? Authorizing Provider  acetaminophen  (TYLENOL) 325 MG tablet Take 2 tablets (650 mg total) by mouth every 6 (six) hours as needed for mild pain (or Fever >/= 101). 09/07/14  Yes Ramonita Lab, MD  amLODipine (NORVASC) 10 MG tablet Take 10 mg by mouth daily.   Yes Historical Provider, MD  carvedilol (COREG) 25 MG tablet Take 25 mg by mouth 2 (two) times daily with a meal.   Yes Historical Provider, MD  cloNIDine (CATAPRES) 0.3 MG tablet Take 0.3 mg by mouth 2 (two) times daily.   Yes Historical Provider, MD  docusate sodium (COLACE) 100 MG capsule Take 100 mg by mouth 2 (two) times daily.   Yes Historical Provider, MD  ferrous sulfate 325 (65 FE) MG tablet Take 325 mg by mouth daily with breakfast.   Yes Historical Provider, MD  gabapentin (NEURONTIN) 300 MG capsule Take 300 mg by mouth 3 (three) times daily.   Yes Historical Provider, MD  hydrALAZINE (APRESOLINE) 25 MG tablet Take 1 tablet (25 mg total) by mouth every 8 (eight) hours. 09/07/14  Yes Ramonita Lab, MD  hydrochlorothiazide (MICROZIDE) 12.5 MG capsule Take 12.5 mg by mouth daily.   Yes Historical Provider, MD  HYDROcodone-acetaminophen (NORCO) 10-325 MG per tablet Take 0.5-1 tablets by mouth every 6 (six) hours as needed for severe pain. 09/07/14  Yes Ramonita Lab, MD  levothyroxine (SYNTHROID, LEVOTHROID) 50 MCG tablet Take 50 mcg by mouth daily.   Yes Historical Provider, MD  lisinopril (PRINIVIL,ZESTRIL) 40 MG tablet Take 40 mg by mouth daily.   Yes Historical Provider, MD  Multiple Vitamin (MULTIVITAMIN WITH MINERALS) TABS tablet Take 1 tablet by mouth daily.   Yes Historical Provider, MD  pantoprazole (PROTONIX) 40 MG tablet Take 40 mg by mouth 2 (two) times daily.   Yes Historical Provider, MD  pravastatin (PRAVACHOL) 10 MG tablet Take 10 mg by mouth at bedtime.   Yes Historical Provider, MD  sucralfate (CARAFATE) 1 G tablet Take 1 g by mouth 3 (three) times daily.   Yes Historical Provider, MD  ondansetron (ZOFRAN) 4 MG tablet Take 1 tablet (4 mg total) by mouth every 8  (eight) hours as needed for nausea or vomiting. 09/02/14 09/02/15  Phineas Semen, MD    Family History  Problem Relation Age of Onset  . Diabetes Mother   . Hypertension Mother   . Hypertension Sister   . Thyroid disease Sister      History  Substance Use Topics  . Smoking status: Current Every Day Smoker -- 1.00 packs/day for 20 years  . Smokeless tobacco: Not on file  . Alcohol Use: No    Allergies as of 12/02/2014  . (No Known Allergies)    Review of Systems:    All systems reviewed and negative except where noted in HPI.   Physical Exam:  Vital signs in last 24 hours: Temp:  [98.5 F (36.9 C)-99.3 F (37.4 C)] 98.5 F (36.9 C) (08/08 1300) Pulse Rate:  [40-105] 67 (08/08 1300) Resp:  [12-32] 20 (08/08 1600) BP: (90-231)/(52-107) 90/53 mmHg (08/08 1600) SpO2:  [97 %-100 %] 100 % (08/08 1300) Weight:  [167 lb 12.3 oz (76.1 kg)] 167 lb 12.3 oz (76.1 kg) (08/07 2232)   General:   Pleasant, cooperative in NAD Head:  Normocephalic and atraumatic. Eyes:   No icterus.   Conjunctiva pink. PERRLA. Ears:  Normal auditory acuity. Neck:  Supple; no masses or thyroidomegaly Lungs: Respirations even and unlabored. Lungs clear to auscultation bilaterally.   No wheezes, crackles, or rhonchi.  Heart:  Regular rate and rhythm;  Without murmur, clicks, rubs or gallops Abdomen:  Soft, nondistended, with tenderness to 1 finger palpation while flexing the abdominal wall muscles. Normal bowel sounds. No appreciable masses or hepatomegaly.  No rebound or guarding.  Rectal:  Not performed. Msk:  Symmetrical without gross deformities.  Strength  Extremities:  Without edema, cyanosis or clubbing. Neurologic:  Alert and oriented x3;  grossly normal neurologically. Skin:  Intact without significant lesions or rashes. Cervical Nodes:  No significant cervical adenopathy. Psych:  Alert and cooperative. Normal affect.  LAB RESULTS:  Recent Labs  12/02/14 1220 12/03/14 0313  WBC 8.9 7.6    HGB 14.5 12.5  HCT 43.2 36.8  PLT 372 322   BMET  Recent Labs  12/02/14 1220 12/03/14 0313  NA 138 141  K 3.4* 3.2*  CL 103 107  CO2 26 26  GLUCOSE 129* 129*  BUN 15 14  CREATININE 0.90 0.94  CALCIUM 9.8 9.0   LFT  Recent Labs  12/02/14 2206  PROT 8.2*  ALBUMIN 4.1  AST 712*  ALT 600*  ALKPHOS 147*  BILITOT 0.7  BILIDIR 0.2  IBILI 0.5   PT/INR  Recent Labs  12/02/14 1220  LABPROT 13.3  INR 0.99    STUDIES: Dg Chest 2 View  12/02/2014   CLINICAL DATA:  Weakness, epigastric pain, smoker  EXAM: CHEST  2 VIEW  COMPARISON:  03/15/2014  FINDINGS: Normal heart size, mediastinal contours and pulmonary vascularity.  Minimal scarring lateral lower RIGHT chest.  Lungs otherwise clear.  No pleural effusion or pneumothorax.  Bones unremarkable.  IMPRESSION: Minimal RIGHT lung scarring.  No acute abnormalities.   Electronically Signed   By: Ulyses Southward M.D.   On: 12/02/2014 12:47   Ct Head Wo Contrast  12/02/2014   CLINICAL DATA:  Pt is a very poor historian. Per pt chart "Patient presents to the ED with centralized chest pain since 5am this morning. Patient reports vomiting greater than 5 times since 5am. Patient is in obvious discomfort. Patient reports a history of high blood pressure. Patient denies shortness of breath. Denies diaphoresis. Patient is also complaining of a headache that began after patient skipped her morning bp medicaitons due to her nausea". Pt with documented hx of stroke, high blood pressure, and blood clots.  EXAM: CT HEAD WITHOUT CONTRAST  TECHNIQUE: Contiguous axial images were obtained from the base of the skull through the vertex without intravenous contrast.  COMPARISON:  09/15/2012  FINDINGS: Ventricles are normal in size in configuration. There are no parenchymal masses or mass effect. There is no evidence of a recent cortical infarct. Well-defined hypoattenuation is noted in the right pons consistent with an old lacune infarct. There are no  extra-axial masses or abnormal fluid collections.  There is no intracranial hemorrhage.  Visualized sinuses and mastoid air cells are clear.  IMPRESSION: 1. No acute intracranial abnormalities.   Electronically Signed   By: Amie Portland M.D.   On: 12/02/2014 14:38   US Abdomen Limited Ruq  12/02/2014   CLINICAL DATA:  Elevated LFTs  EXAM: US ABDOMEN LIMITED - RIGHT UPPER QUADRANT  COMPARISON:  09/05/2014  FINDINGS: Gallbladder:  Mild gallbladder wall thickening/edema. No gallstones. Negative sonographic Murphy's sign.  Common bile duct:  Diameter: 4 mm  Liver:  No focal lesion identified. At the upper limits of normal for parenchymal echogenicity.  IMPRESSION: Mild gallbladder wall thickening, but without secondary findings to suggest acute cholecystitis.  Given the LFTs, this may be secondary to a right upper quadrant inflammatory process such as hepatitis.   Electronically Signed   By: Charline Bills M.D.   On: 12/02/2014 15:48      Impression / Plan:   Kimberly Mccarthy is a 59 y.o. y/o female with abnormal liver enzymes with epigastric tenderness reproducible with 1 finger palpation while flexing the abdominal wall muscles consistent with muscular wall pain. The patient's abnormal liver enzymes may be related to her Tylenol. The reasons for liver enzymes to typically be over thousand is due to acute viral infection versus ischemia versus medications. The patient's Tylenol level was low on admission. The patient's liver enzymes are slowly coming back to normal and her abdominal pain is getting better. I would continue monitoring the patient's progress. Her hepatitis panel is pending. The patient denies any alcohol abuse. If the patient's liver enzymes do not rapidly returned to normal then further workup will be needed.  Thank you for involving me in the care of this patient.      LOS: 1 day   Darlina Rumpf, MD  12/03/2014, 4:54 PM   Note: This dictation was prepared with Dragon dictation along  with smaller phrase technology. Any transcriptional errors that result from this process are unintentional.

## 2014-12-04 DIAGNOSIS — R079 Chest pain, unspecified: Secondary | ICD-10-CM

## 2014-12-04 DIAGNOSIS — K529 Noninfective gastroenteritis and colitis, unspecified: Secondary | ICD-10-CM

## 2014-12-04 LAB — HEPATIC FUNCTION PANEL
ALBUMIN: 3.2 g/dL — AB (ref 3.5–5.0)
ALK PHOS: 85 U/L (ref 38–126)
ALT: 204 U/L — ABNORMAL HIGH (ref 14–54)
AST: 64 U/L — AB (ref 15–41)
BILIRUBIN TOTAL: 0.4 mg/dL (ref 0.3–1.2)
TOTAL PROTEIN: 6.3 g/dL — AB (ref 6.5–8.1)

## 2014-12-04 LAB — HEPATITIS PANEL, ACUTE
HEP B C IGM: NEGATIVE
Hep A IgM: NEGATIVE
Hepatitis B Surface Ag: NEGATIVE

## 2014-12-04 LAB — POTASSIUM: POTASSIUM: 3.9 mmol/L (ref 3.5–5.1)

## 2014-12-04 MED ORDER — ENOXAPARIN SODIUM 40 MG/0.4ML ~~LOC~~ SOLN: 40.0000 mg | SUBCUTANEOUS | Status: DC

## 2014-12-04 MED ORDER — TRAMADOL HCL 50 MG PO TABS: 50.0000 mg | ORAL_TABLET | Freq: Four times a day (QID) | ORAL | Status: DC | PRN

## 2014-12-04 MED ORDER — NICOTINE 21 MG/24HR TD PT24: 21.0000 mg | MEDICATED_PATCH | Freq: Every day | TRANSDERMAL | Status: DC

## 2014-12-04 NOTE — Care Management (Signed)
Spoke with patient in regards to her concerns of discharge.  Patient would like to stay another night.  I was contacted Dr. Winona Legato who stated that the patient medically did not have a medical reason to stay in the hospital.  I provided the patient with several options.  She could be discharged home and follow up with the [facility], if she refused to be discharged I informed her that Medicaid may deny the stay and she could receive a bill, or she could pursue inpatient rehab that was recommend however she refused.  At this time patient wanted to speak with social work to find out more information about placement.

## 2014-12-04 NOTE — Progress Notes (Signed)
[facility] [place] at [facility]   PATIENT NAME: Kimberly Mccarthy    MR#:  [id number]  DATE OF BIRTH:  10/10/1955  SUBJECTIVE:  CHIEF COMPLAINT:   Chief Complaint  Patient presents with  . Chest Pain   Patient is 59 year old African-American female with history of stroke, hypertension, peripheral vascular disease status post recent vascular procedures, resulting in significant neuropathy in lower extremities,  Also CK D,  nicotine addiction who presents to the hospital with nausea, vomiting, chest pains, abdominal pains, diarrhea. She was noted to have elevated blood pressure in 200s and was started on IV medications and admitted to intensive care unit. She also admitted of not taking her blood pressure medications for one day because of nausea, vomiting and not feeling well. Now her blood pressure medications were reinitiated and her blood pressure is back to normal and she is off IV drip. She feels better today. Denies any significant pains, although admits of some discomfort and upper abdomen and not feeling hungry. No more diarrhea, no nausea, vomiting. Patient's liver enzymes were found to be abnormal, but patient's family. Admits of patient using multiple doses of Tylenol.  Feels much better today. Denies any significant abdominal discomfort. Within by Dr. Servando Snare gastroenterologist felt that patient had abdominal wall tenderness and elevated liver enzymes consistent with Tylenol abuse. Hepatitis panel was negative. LFTs have improved stopping Tylenol. Tylenol level was low on admission.  Patient was seen by physical therapist and unfortunately she has significant balance problems and recommended rehabilitation placement Management will discuss rehabilitation placement this patient and family. Patient is very upset about that    Review of Systems  Constitutional: Negative for fever, chills and weight loss.  HENT: Negative for congestion.   Eyes: Negative for  blurred vision and double vision.  Respiratory: Negative for cough, sputum production, shortness of breath and wheezing.   Cardiovascular: Negative for chest pain, palpitations, orthopnea, leg swelling and PND.  Gastrointestinal: Positive for nausea, abdominal pain and diarrhea. Negative for vomiting, constipation and blood in stool.  Genitourinary: Negative for dysuria, urgency, frequency and hematuria.  Musculoskeletal: Negative for falls.  Neurological: Negative for dizziness, tremors, focal weakness and headaches.  Endo/Heme/Allergies: Does not bruise/bleed easily.  Psychiatric/Behavioral: Negative for depression. The patient does not have insomnia.     VITAL SIGNS: Blood pressure 152/61, pulse 67, temperature 98.7 F (37.1 C), temperature source Oral, resp. rate 18, height 5\' 7"  (1.702 m), weight 76.1 kg (167 lb 12.3 oz), SpO2 99 %.  PHYSICAL EXAMINATION:   GENERAL:  59 y.o.-year-old patient lying in the bed with no acute distress.  EYES: Pupils equal, round, reactive to light and accommodation. No scleral icterus. Extraocular muscles intact.  HEENT: Head atraumatic, normocephalic. Oropharynx and nasopharynx clear.  NECK:  Supple, no jugular venous distention. No thyroid enlargement, no tenderness.  LUNGS: Normal breath sounds bilaterally, no wheezing, rales,rhonchi or crepitation. No use of accessory muscles of respiration.  CARDIOVASCULAR: S1, S2 normal. No murmurs, rubs, or gallops.  ABDOMEN: Soft, minimal discomfort, and if any in the upper abdomen but no rebound or guarding.  nondistended. Bowel sounds present. No organomegaly or mass.  EXTREMITIES: No pedal edema, cyanosis, or clubbing.  NEUROLOGIC: Cranial nerves II through XII are intact. Muscle strength 5/5 in all extremities. Sensation intact. Gait not checked.  PSYCHIATRIC: The patient is alert and oriented x 3.  SKIN: No obvious rash, lesion, or ulcer. Significant scarring in left lower extremity after procedures. Patient  does have necrosis of her  left great toe distal phalanx. Scarring in the groin, right and left.  Significant sensitivity to light touch in both lower extremities. Peripheral pulses are poorly palpable. However, patient's feet are warm.   ORDERS/RESULTS REVIEWED:   CBC  Recent Labs Lab 12/02/14 1220 12/03/14 0313  WBC 8.9 7.6  HGB 14.5 12.5  HCT 43.2 36.8  PLT 372 322  MCV 89.4 89.9  MCH 30.0 30.5  MCHC 33.5 33.9  RDW 16.1* 16.1*   ------------------------------------------------------------------------------------------------------------------  Chemistries   Recent Labs Lab 12/02/14 1220 12/02/14 2206 12/03/14 0313 12/04/14 1000  NA 138  --  141  --   K 3.4*  --  3.2*  --   CL 103  --  107  --   CO2 26  --  26  --   GLUCOSE 129*  --  129*  --   BUN 15  --  14  --   CREATININE 0.90  --  0.94  --   CALCIUM 9.8  --  9.0  --   AST 1092* 712*  --  64*  ALT 478* 600*  --  204*  ALKPHOS 129* 147*  --  85  BILITOT 0.9 0.7  --  0.4   ------------------------------------------------------------------------------------------------------------------ estimated creatinine clearance is 69.4 mL/min (by C-G formula based on Cr of 0.94). ------------------------------------------------------------------------------------------------------------------  Recent Labs  12/02/14 1220  TSH 0.544    Cardiac Enzymes  Recent Labs Lab 12/02/14 2206 12/03/14 0313 12/03/14 0917  TROPONINI 0.08* 0.08* 0.04*   ------------------------------------------------------------------------------------------------------------------ Invalid input(s): POCBNP ---------------------------------------------------------------------------------------------------------------  RADIOLOGY: Dg Chest 2 View  12/02/2014   CLINICAL DATA:  Weakness, epigastric pain, smoker  EXAM: CHEST  2 VIEW  COMPARISON:  03/15/2014  FINDINGS: Normal heart size, mediastinal contours and pulmonary vascularity.  Minimal  scarring lateral lower RIGHT chest.  Lungs otherwise clear.  No pleural effusion or pneumothorax.  Bones unremarkable.  IMPRESSION: Minimal RIGHT lung scarring.  No acute abnormalities.   Electronically Signed   By: Ulyses Southward M.D.   On: 12/02/2014 12:47   Ct Head Wo Contrast  12/02/2014   CLINICAL DATA:  Pt is a very poor historian. Per pt chart "Patient presents to the ED with centralized chest pain since 5am this morning. Patient reports vomiting greater than 5 times since 5am. Patient is in obvious discomfort. Patient reports a history of high blood pressure. Patient denies shortness of breath. Denies diaphoresis. Patient is also complaining of a headache that began after patient skipped her morning bp medicaitons due to her nausea". Pt with documented hx of stroke, high blood pressure, and blood clots.  EXAM: CT HEAD WITHOUT CONTRAST  TECHNIQUE: Contiguous axial images were obtained from the base of the skull through the vertex without intravenous contrast.  COMPARISON:  09/15/2012  FINDINGS: Ventricles are normal in size in configuration. There are no parenchymal masses or mass effect. There is no evidence of a recent cortical infarct. Well-defined hypoattenuation is noted in the right pons consistent with an old lacune infarct. There are no extra-axial masses or abnormal fluid collections.  There is no intracranial hemorrhage.  Visualized sinuses and mastoid air cells are clear.  IMPRESSION: 1. No acute intracranial abnormalities.   Electronically Signed   By: Amie Portland M.D.   On: 12/02/2014 14:38   US Abdomen Limited Ruq  12/02/2014   CLINICAL DATA:  Elevated LFTs  EXAM: US ABDOMEN LIMITED - RIGHT UPPER QUADRANT  COMPARISON:  09/05/2014  FINDINGS: Gallbladder:  Mild gallbladder wall thickening/edema. No gallstones. Negative sonographic Murphy's  sign.  Common bile duct:  Diameter: 4 mm  Liver:  No focal lesion identified. At the upper limits of normal for parenchymal echogenicity.  IMPRESSION: Mild  gallbladder wall thickening, but without secondary findings to suggest acute cholecystitis.  Given the LFTs, this may be secondary to a right upper quadrant inflammatory process such as hepatitis.   Electronically Signed   By: Charline Bills M.D.   On: 12/02/2014 15:48    EKG:  Orders placed or performed during the hospital encounter of 12/02/14  . ED EKG within 10 minutes  . ED EKG within 10 minutes    ASSESSMENT AND PLAN:  Principal Problem:   HTN (hypertension), malignant Active Problems:   Elevated liver enzymes   Abdominal pain, epigastric   Chest pain   Acute gastroenteritis 1. Malignant essential hypertension. Continue patient's outpatient medications . Blood pressure is better controlled. Follow with blood pressure medications may need to have a p.m. medications advanced.  2. Elevated troponin, likely demand ischemia.Continue aspirin as well as Coreg. Follow up with cardiologist as outpatient . Echocardiogram was performed and it was unremarkable, revealing no wall motion abnormalities and normal ejection fraction of 60%.  Patient may benefit from stress test as outpatient  3. Elevated transaminases due to Tylenol abuse, likely. Tylenol level is low on admission. Appreciate gastroenterology consultation . LFTs are improving. Acute hepatitis panel is negative  4. Acute gastritis or gastroenteritis,  symptoms are resolving with conservative therapy , continue PPI. We will get stool cultures if diarrhea recurs.  5. Peripheral neuropathy , continue patient on  tramadol, gabapentin 6. Ataxia. Patient may benefit from rehabilitation placement, care management will discuss with patient as well as the family. Very likely deconditioning as well as immobility related, although cannot rule out due to new medication, tramadol.   Management plans discussed with the patient, family and they are in agreement.   DRUG ALLERGIES: No Known Allergies  CODE STATUS:     Code Status Orders         Start     Ordered   12/02/14 2134  Full code   Continuous     12/02/14 2133      TOTAL TIME TAKING CARE OF THIS PATIENT: 40 minutes.    Katharina Caper M.D on 12/04/2014 at 11:25 AM  Between 7am to 6pm - Pager - [phone]  After 6pm go to [email or web address] - password [email or web address]  [facility]  Office  [phone]  CC: Primary care physician; No PCP Per Patient

## 2014-12-04 NOTE — Evaluation (Signed)
Physical Therapy Evaluation Patient Details Name: Kimberly Mccarthy MRN: [id number] DOB: [birth year] Today's Date: 12/04/2014   History of Present Illness  Pt is a 59 y.o. female presenting to hospital with chest pain and hypertensive emergency.  Troponin peaked at 0.08 and downtrending (likely demand ischemia per cardiology note).  PMH includes:  stroke May 2015, htn, h/o blood clots, AVM, PVD, anemia, CKD.  Clinical Impression  Currently pt demonstrates impairments with balance, strength, pain, and limitations with functional mobility.  Prior to admission, pt was independent with transfers but had SBA of her sister for ambulation for safety d/t h/o balance issues (pt using rollator to ambulate with).  Pt lives with her sister in 1 level home with steps to enter.  Currently pt is SBA with transfers but varied between CGA to min assist to mod assist with ambulation d/t 5 loss of balance during ambulation using RW requiring assist to prevent falls.  Pt reports that her balance was a lot better prior to hospital admission and did not have this much difficulty walking.  Pt would benefit from skilled PT to address above noted impairments and functional limitations.  Recommend pt discharge to STR when medically appropriate d/t pt appearing at high risk for falls and is below pt's self-reported baseline.     Follow Up Recommendations SNF    Equipment Recommendations  Rolling walker with 5" wheels    Recommendations for Other Services       Precautions / Restrictions Precautions Precautions: Fall Restrictions Weight Bearing Restrictions: No      Mobility  Bed Mobility Overal bed mobility: Needs Assistance Bed Mobility: Supine to Sit;Sit to Supine     Supine to sit: Supervision Sit to supine: Supervision      Transfers Overall transfer level: Needs assistance Equipment used: None Transfers: Stand Pivot Transfers   Stand pivot transfers: Min guard (stand step turn bed to/from  commode)          Ambulation/Gait Ambulation/Gait assistance: Min assist;Mod assist;Min guard (assist fluctuating during ambulation) Ambulation Distance (Feet): 40 Feet Assistive device: Rolling walker (2 wheeled)       General Gait Details: flexed trunk; vc's and assist to keep RW closer; multiple (5x) loss of balance to L side requiring assist to prevent fall; decreased B step length/foot clearance/heelstrike  Stairs            Wheelchair Mobility    Modified Rankin (Stroke Patients Only)       Balance Overall balance assessment: Needs assistance Sitting-balance support: No upper extremity supported;Feet supported Sitting balance-Leahy Scale: Normal     Standing balance support: Bilateral upper extremity supported Standing balance-Leahy Scale: Fair                               Pertinent Vitals/Pain Pain Assessment: 0-10 Pain Score: 10-Worst pain ever (pain increased with ambulation (pt reports pain from vascular surgeries)) Pain Location: R lower leg Pain Descriptors / Indicators: Aching Pain Intervention(s): Patient requesting pain meds-RN notified  HR 67 bpm post ambulation    Home Living Family/patient expects to be discharged to:: Private residence Living Arrangements: Other relatives (Sister who works 3rd shift) Available Help at Discharge: Family Type of Home: House Home Access: Stairs to enter   Secretary/administrator of Steps: 1 no railing plus 2 with R railing Home Layout: One level Home Equipment: Walker - 4 wheels      Prior Function Level of Independence: Needs assistance  Gait / Transfers Assistance Needed: independent with transfers to bedside commode but has SBA for ambulation (using rollator) for safety d/t pt reporting being "unsteady" although denies any falls in past 6 months  ADL's / Homemaking Assistance Needed: takes sponge baths (pt's sister brings pt water/set's it up for pt)        Hand Dominance         Extremity/Trunk Assessment   Upper Extremity Assessment: Generalized weakness           Lower Extremity Assessment: RLE deficits/detail;LLE deficits/detail RLE Deficits / Details: R hip flexion 3-/5; R knee extension 3-/5; R knee flexion 2+/5; R DF 2/5 LLE Deficits / Details: L hip flexion 3-/5; L knee extension 3-/5; L knee flexion 2+/5; L DF 2/5     Communication   Communication: No difficulties  Cognition Arousal/Alertness: Awake/alert Behavior During Therapy: WFL for tasks assessed/performed Overall Cognitive Status: Within Functional Limits for tasks assessed                      General Comments General comments (skin integrity, edema, etc.): previous LE vascular surgery incisions noted  Nursing cleared pt for participation in physical therapy.  Pt agreeable to PT session.  Pt requesting to use commode during session.    Exercises        Assessment/Plan    PT Assessment Patient needs continued PT services  PT Diagnosis Difficulty walking   PT Problem List Decreased strength;Decreased activity tolerance;Decreased balance;Decreased mobility;Pain  PT Treatment Interventions DME instruction;Gait training;Stair training;Functional mobility training;Therapeutic activities;Therapeutic exercise;Balance training;Patient/family education   PT Goals (Current goals can be found in the Care Plan section) Acute Rehab PT Goals Patient Stated Goal: To improve balance PT Goal Formulation: With patient Time For Goal Achievement: 12/18/14 Potential to Achieve Goals: Fair    Frequency Min 2X/week   Barriers to discharge Decreased caregiver support      Co-evaluation               End of Session Equipment Utilized During Treatment: Gait belt Activity Tolerance: Patient limited by pain (increased R LE pain with ambulation (pt reporting pain vascular related)) Patient left: in bed;with call bell/phone within reach;with bed alarm set Nurse Communication: Mobility  status;Patient requests pain meds         Time: 1028-1058 PT Time Calculation (min) (ACUTE ONLY): 30 min   Charges:   PT Evaluation $Initial PT Evaluation Tier I: 1 Procedure PT Treatments $Therapeutic Activity: 8-22 mins   PT G CodesHendricks Limes 2014-12-18, 11:32 AM Hendricks Limes, PT [phone]

## 2014-12-04 NOTE — Progress Notes (Signed)
Pt discharged to home via wc.  Instructions and rx given to pt.  Questions answered.  No distress.  

## 2014-12-04 NOTE — Progress Notes (Signed)
[facility] Card[facility] Encounter Note  Patient: Kimberly Mccarthy / Admit Date: 12/02/2014 / Date of Encounter: 12/04/2014, 8:44 AM   Subjective: No more chest pain but does have some leg pain  Review of Systems: Positive for: Right knee pain Negative for: Vision change, hearing change, syncope, dizziness, nausea, vomiting,diarrhea, bloody stool, stomach pain, cough, congestion, diaphoresis, urinary frequency, urinary pain,skin lesions, skin rashes Others previously listed  Objective: Telemetry: Normal sinus rhythm Physical Exam: Blood pressure 152/61, pulse 56, temperature 98.7 F (37.1 C), temperature source Oral, resp. rate 18, height 5\' 7"  (1.702 m), weight 167 lb 12.3 oz (76.1 kg), SpO2 99 %. Body mass index is 26.27 kg/(m^2). General: Well developed, well nourished, in no acute distress. Head: Normocephalic, atraumatic, sclera non-icteric, no xanthomas, nares are without discharge. Neck: No apparent masses Lungs: Normal respirations with few wheezes, no rhonchi, no rales , no crackles   Heart: Regular rate and rhythm, normal S1 S2, no murmur, no rub, no gallop, PMI is normal size and placement, carotid upstroke normal without bruit, jugular venous pressure normal Abdomen: Soft, non-tender, non-distended with normoactive bowel sounds. No hepatosplenomegaly. Abdominal aorta is normal size without bruit Extremities: Trace edema, no clubbing, no cyanosis, no ulcers,  Peripheral: 2+ radial, 0 + femoral, 0 + dorsal pedal pulses Neuro: Alert and oriented. Moves all extremities spontaneously. Psych:  Responds to questions appropriately with a normal affect.   Intake/Output Summary (Last 24 hours) at 12/04/14 0844 Last data filed at 12/04/14 0611  Gross per 24 hour  Intake   1080 ml  Output    550 ml  Net    530 ml    Inpatient Medications:  . amLODipine  10 mg Oral Daily  . aspirin  81 mg Oral Daily  . carvedilol  25 mg Oral BID WC  . cloNIDine  0.3 mg Oral BID  .  docusate sodium  100 mg Oral BID  . ferrous sulfate  325 mg Oral Q breakfast  . gabapentin  400 mg Oral TID  . gi cocktail  30 mL Oral Once  . heparin  5,000 Units Subcutaneous 3 times per day  . hydrALAZINE  25 mg Oral 3 times per day  . hydrochlorothiazide  12.5 mg Oral Daily  . levothyroxine  50 mcg Oral QAC breakfast  . lisinopril  40 mg Oral Daily  . multivitamin with minerals  1 tablet Oral Daily  . nicotine  21 mg Transdermal Daily  . pantoprazole  40 mg Oral BID  . potassium chloride  40 mEq Oral Once  . sucralfate  1 g Oral TID   Infusions:    Labs:  Recent Labs  12/02/14 1220 12/03/14 0313  NA 138 141  K 3.4* 3.2*  CL 103 107  CO2 26 26  GLUCOSE 129* 129*  BUN 15 14  CREATININE 0.90 0.94  CALCIUM 9.8 9.0    Recent Labs  12/02/14 1220 12/02/14 2206  AST 1092* 712*  ALT 478* 600*  ALKPHOS 129* 147*  BILITOT 0.9 0.7  PROT 8.4* 8.2*  ALBUMIN 4.4 4.1    Recent Labs  12/02/14 1220 12/03/14 0313  WBC 8.9 7.6  HGB 14.5 12.5  HCT 43.2 36.8  MCV 89.4 89.9  PLT 372 322    Recent Labs  12/02/14 1545 12/02/14 2206 12/03/14 0313 12/03/14 0917  TROPONINI <0.03 0.08* 0.08* 0.04*   Invalid input(s): POCBNP No results for input(s): HGBA1C in the last 72 hours.   Weights: [employer]   12/02/14  1207 12/02/14 2232  Weight: 180 lb (81.647 kg) 167 lb 12.3 oz (76.1 kg)     Radiology/Studies:  Dg Chest 2 View  12/02/2014   CLINICAL DATA:  Weakness, epigastric pain, smoker  EXAM: CHEST  2 VIEW  COMPARISON:  03/15/2014  FINDINGS: Normal heart size, mediastinal contours and pulmonary vascularity.  Minimal scarring lateral lower RIGHT chest.  Lungs otherwise clear.  No pleural effusion or pneumothorax.  Bones unremarkable.  IMPRESSION: Minimal RIGHT lung scarring.  No acute abnormalities.   Electronically Signed   By: Ulyses Southward M.D.   On: 12/02/2014 12:47   Ct Head Wo Contrast  12/02/2014   CLINICAL DATA:  Pt is a very poor historian. Per pt chart  "Patient presents to the ED with centralized chest pain since 5am this morning. Patient reports vomiting greater than 5 times since 5am. Patient is in obvious discomfort. Patient reports a history of high blood pressure. Patient denies shortness of breath. Denies diaphoresis. Patient is also complaining of a headache that began after patient skipped her morning bp medicaitons due to her nausea". Pt with documented hx of stroke, high blood pressure, and blood clots.  EXAM: CT HEAD WITHOUT CONTRAST  TECHNIQUE: Contiguous axial images were obtained from the base of the skull through the vertex without intravenous contrast.  COMPARISON:  09/15/2012  FINDINGS: Ventricles are normal in size in configuration. There are no parenchymal masses or mass effect. There is no evidence of a recent cortical infarct. Well-defined hypoattenuation is noted in the right pons consistent with an old lacune infarct. There are no extra-axial masses or abnormal fluid collections.  There is no intracranial hemorrhage.  Visualized sinuses and mastoid air cells are clear.  IMPRESSION: 1. No acute intracranial abnormalities.   Electronically Signed   By: Amie Portland M.D.   On: 12/02/2014 14:38   US Abdomen Limited Ruq  12/02/2014   CLINICAL DATA:  Elevated LFTs  EXAM: US ABDOMEN LIMITED - RIGHT UPPER QUADRANT  COMPARISON:  09/05/2014  FINDINGS: Gallbladder:  Mild gallbladder wall thickening/edema. No gallstones. Negative sonographic Murphy's sign.  Common bile duct:  Diameter: 4 mm  Liver:  No focal lesion identified. At the upper limits of normal for parenchymal echogenicity.  IMPRESSION: Mild gallbladder wall thickening, but without secondary findings to suggest acute cholecystitis.  Given the LFTs, this may be secondary to a right upper quadrant inflammatory process such as hepatitis.   Electronically Signed   By: Charline Bills M.D.   On: 12/02/2014 15:48     Assessment and Recommendation  59 y.o. 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 Continue current medical regimen for intense hypertension control and further risk reduction in cardiovascular disease 2. Begin ambulation and follow for any further significant symptoms from the cardiac standpoint 3. If no further significant symptoms and patient feeling well okay for discharge to home from cardiac standpoint with follow-up at the end of the week to further evaluate in functional testing 4. Continue high intensity cholesterol therapy with statin 5. Antiplatelets medication management for peripheral vascular disease 6. No further cardiac diagnostics necessary at this time  Signed, Arnoldo Hooker M.D. FACC

## 2014-12-04 NOTE — Discharge Summary (Signed)
[facility] [place] at [facility]   PATIENT NAME: Kimberly Mccarthy    MR#:  [id number]  DATE OF BIRTH:  November 27, 1955  DATE OF ADMISSION:  12/02/2014 ADMITTING PHYSICIAN: Auburn Bilberry, MD  DATE OF DISCHARGE: No discharge date for patient encounter.  PRIMARY CARE PHYSICIAN: No PCP Per Patient     ADMISSION DIAGNOSIS:  Elevated liver enzymes [R74.8] Hypertensive urgency [I10]  DISCHARGE DIAGNOSIS:  Principal Problem:   HTN (hypertension), malignant Active Problems:   Elevated liver enzymes   Abdominal pain, epigastric   Chest pain   Acute gastroenteritis   SECONDARY DIAGNOSIS:   Past Medical History  Diagnosis Date  . Stroke     may 2015  . Hypertension   . Thyroid disease   . H/O blood clots   . Anemia   . AVM (arteriovenous malformation)   . Anemia   . Renal failure   . PVD (peripheral vascular disease)   . Hyperkalemia   . Carotid artery stenosis     .pro HOSPITAL COURSE:   The patient is 59 year old African-American female with past medical history significant for history of multiple medical problems including stroke, hypertension, peripheral vascular disease, COPD, hyperlipidemia, tobacco abuse who presents to the hospital with complaints of chest pains, shortness of breath, nausea, vomiting, diarrhea, headaches. She was noted to be severely hypertensive with systolic blood pressure above 409 in the emergency room. Patient received multiple medications of lower including labetalol IV and clonidine and because her blood pressure remained high she was admitted for nitroglycerin IV drip. Home medications were restarted with which her blood pressure improved. Patient's troponin was found to be slightly elevated, which was felt to be due to demand ischemia . Patient's labs revealed elevated liver function tests, chest x-ray, head CT were unremarkable. Upon further further questioning, it appeared that patient has been taking lots of Tylenol for  her lower extremity neuropathy. Patient's Tylenol was stopped and she was consulted by gastroenterologist. Acute hepatitis panel was performed. It was negative. Patient was seen by physical therapist and the was noted to have significant balance problems, ataxia. Patient had discussion with care management and decision was made since she did not want to go to rehabilitation facility to refer patient to [facility]. She was felt to be stable today to be discharged home today.  Discussion by problem 1. Malignant essential hypertension. Continue patient's outpatient medications . Blood pressure is well controlled. It is likely that patient's uncontrolled blood pressure was related to significant lower extremity pains as well as nausea, vomiting.  2. Elevated troponin, likely demand ischemia.Continue aspirin as well as Coreg. Follow up with cardiologist as outpatient . Echocardiogram was performed and it was unremarkable, revealing no wall motion abnormalities and normal ejection fraction of 60%. Patient may benefit from stress test as outpatient , though 3. Elevated transaminases due to Tylenol abuse, likely. Tylenol level is low on admission. Appreciate gastroenterology consultation . LFTs are improving. Acute hepatitis panel is negative  4. Acute gastritis or gastroenteritis, symptoms are resolving with conservative therapy , continue PPI. We will get stool cultures if diarrhea recurs.  5. Peripheral neuropathy , continue patient on tramadol, gabapentin, much improved pain control 6. Ataxia. Patient not go to a rehabilitation facility due to lack of insurance. She is referred to [facility] clinic. Discussed with care management.  DISCHARGE CONDITIONS:   Stable  CONSULTS OBTAINED:  Treatment Team:  Auburn Bilberry, MD Midge Minium, MD Lamar Blinks, MD  DRUG ALLERGIES:  No  Known Allergies  DISCHARGE MEDICATIONS:   Current Discharge Medication List    START taking these medications   Details   nicotine (NICODERM CQ - DOSED IN MG/24 HOURS) 21 mg/24hr patch Place 1 patch (21 mg total) onto the skin daily. Qty: 28 patch, Refills: 0    traMADol (ULTRAM) 50 MG tablet Take 1 tablet (50 mg total) by mouth every 6 (six) hours as needed for moderate pain or severe pain. Qty: 30 tablet, Refills: 3      CONTINUE these medications which have NOT CHANGED   Details  amLODipine (NORVASC) 10 MG tablet Take 10 mg by mouth daily.    carvedilol (COREG) 25 MG tablet Take 25 mg by mouth 2 (two) times daily with a meal.    cloNIDine (CATAPRES) 0.3 MG tablet Take 0.3 mg by mouth 2 (two) times daily.    docusate sodium (COLACE) 100 MG capsule Take 100 mg by mouth 2 (two) times daily.    ferrous sulfate 325 (65 FE) MG tablet Take 325 mg by mouth daily with breakfast.    gabapentin (NEURONTIN) 300 MG capsule Take 300 mg by mouth 3 (three) times daily.    hydrALAZINE (APRESOLINE) 25 MG tablet Take 1 tablet (25 mg total) by mouth every 8 (eight) hours. Qty: 90 tablet, Refills: 0    HYDROcodone-acetaminophen (NORCO) 10-325 MG per tablet Take 0.5-1 tablets by mouth every 6 (six) hours as needed for severe pain. Qty: 20 tablet, Refills: 0    levothyroxine (SYNTHROID, LEVOTHROID) 50 MCG tablet Take 50 mcg by mouth daily.    lisinopril (PRINIVIL,ZESTRIL) 40 MG tablet Take 40 mg by mouth daily.    Multiple Vitamin (MULTIVITAMIN WITH MINERALS) TABS tablet Take 1 tablet by mouth daily.    pantoprazole (PROTONIX) 40 MG tablet Take 40 mg by mouth 2 (two) times daily.    pravastatin (PRAVACHOL) 10 MG tablet Take 10 mg by mouth at bedtime.    sucralfate (CARAFATE) 1 G tablet Take 1 g by mouth 3 (three) times daily.    ondansetron (ZOFRAN) 4 MG tablet Take 1 tablet (4 mg total) by mouth every 8 (eight) hours as needed for nausea or vomiting. Qty: 30 tablet, Refills: 1      STOP taking these medications     acetaminophen (TYLENOL) 325 MG tablet      hydrochlorothiazide (MICROZIDE) 12.5 MG capsule           DISCHARGE INSTRUCTIONS:    Follow-up with primary care physician and [facility] in the next 1 week after discharge  If you experience worsening of your admission symptoms, develop shortness of breath, life threatening emergency, suicidal or homicidal thoughts you must seek medical attention immediately by calling 911 or calling your MD immediately  if symptoms less severe.  You Must read complete instructions/literature along with all the possible adverse reactions/side effects for all the Medicines you take and that have been prescribed to you. Take any new Medicines after you have completely understood and accept all the possible adverse reactions/side effects.   Please note  You were cared for by a hospitalist during your hospital stay. If you have any questions about your discharge medications or the care you received while you were in the hospital after you are discharged, you can call the unit and asked to speak with the hospitalist on call if the hospitalist that took care of you is not available. Once you are discharged, your primary care physician will handle any further medical issues. Please note that  NO REFILLS for any discharge medications will be authorized once you are discharged, as it is imperative that you return to your primary care physician (or establish a relationship with a primary care physician if you do not have one) for your aftercare needs so that they can reassess your need for medications and monitor your lab values.    Today   CHIEF COMPLAINT:   Chief Complaint  Patient presents with  . Chest Pain    HISTORY OF PRESENT ILLNESS:  Enyah Moman  is a 59 y.o. female with a known history of  history of multiple medical problems including stroke, hypertension, peripheral vascular disease, COPD, hyperlipidemia, tobacco abuse who presents to the hospital with complaints of chest pains, shortness of breath, nausea, vomiting, diarrhea, headaches. She  was noted to be severely hypertensive with systolic blood pressure above 161 in the emergency room. Patient received multiple medications of lower including labetalol IV and clonidine and because her blood pressure remained high she was admitted for nitroglycerin IV drip. Home medications were restarted with which her blood pressure improved. Patient's troponin was found to be slightly elevated, which was felt to be due to demand ischemia . Patient's labs revealed elevated liver function tests, chest x-ray, head CT were unremarkable. Upon further further questioning, it appeared that patient has been taking lots of Tylenol for her lower extremity neuropathy. Patient's Tylenol was stopped and she was consulted by gastroenterologist. Acute hepatitis panel was performed. It was negative. Patient was seen by physical therapist and the was noted to have significant balance problems, ataxia. Patient had discussion with care management and decision was made since she did not want to go to rehabilitation facility to refer patient to [facility]. She was felt to be stable today to be discharged home today.  Discussion by problem 1. Malignant essential hypertension. Continue patient's outpatient medications . Blood pressure is well controlled. It is likely that patient's uncontrolled blood pressure was related to significant lower extremity pains as well as nausea, vomiting.  2. Elevated troponin, likely demand ischemia.Continue aspirin as well as Coreg. Follow up with cardiologist as outpatient . Echocardiogram was performed and it was unremarkable, revealing no wall motion abnormalities and normal ejection fraction of 60%. Patient may benefit from stress test as outpatient , though 3. Elevated transaminases due to Tylenol abuse, likely. Tylenol level is low on admission. Appreciate gastroenterology consultation . LFTs are improving. Acute hepatitis panel is negative  4. Acute gastritis or gastroenteritis, symptoms  are resolving with conservative therapy , continue PPI. We will get stool cultures if diarrhea recurs.  5. Peripheral neuropathy , continue patient on tramadol, gabapentin, much improved pain control 6. Ataxia. Patient not go to a rehabilitation facility due to lack of insurance. She is referred to [facility] clinic. Discussed with care management.     VITAL SIGNS:  Blood pressure 100/53, pulse 56, temperature 98.4 F (36.9 C), temperature source Oral, resp. rate 16, height 5\' 7"  (1.702 m), weight 76.1 kg (167 lb 12.3 oz), SpO2 100 %.  I/O:   Intake/Output Summary (Last 24 hours) at 12/04/14 1452 Last data filed at 12/04/14 1400  Gross per 24 hour  Intake    600 ml  Output    850 ml  Net   -250 ml    PHYSICAL EXAMINATION:  GENERAL:  59 y.o.-year-old patient lying in the bed with no acute distress.  EYES: Pupils equal, round, reactive to light and accommodation. No scleral icterus. Extraocular muscles intact.  HEENT: Head atraumatic,  normocephalic. Oropharynx and nasopharynx clear.  NECK:  Supple, no jugular venous distention. No thyroid enlargement, no tenderness.  LUNGS: Normal breath sounds bilaterally, no wheezing, rales,rhonchi or crepitation. No use of accessory muscles of respiration.  CARDIOVASCULAR: S1, S2 normal. No murmurs, rubs, or gallops.  ABDOMEN: Soft, minimal discomfort on palpation in epigastric area. No guarding, no rebound, non-distended. Bowel sounds present. No organomegaly or mass.  EXTREMITIES: No pedal edema, cyanosis, or clubbing.  NEUROLOGIC: Cranial nerves II through XII are intact. Muscle strength 5/5 in all extremities. Sensation intact. Gait not checked.  PSYCHIATRIC: The patient is alert and oriented x 3.  SKIN: No obvious rash, lesion, or ulcer.   DATA REVIEW:   CBC  Recent Labs Lab 12/03/14 0313  WBC 7.6  HGB 12.5  HCT 36.8  PLT 322    Chemistries   Recent Labs Lab 12/03/14 0313 12/04/14 1000  NA 141  --   K 3.2* 3.9  CL 107  --    CO2 26  --   GLUCOSE 129*  --   BUN 14  --   CREATININE 0.94  --   CALCIUM 9.0  --   AST  --  64*  ALT  --  204*  ALKPHOS  --  85  BILITOT  --  0.4    Cardiac Enzymes  Recent Labs Lab 12/03/14 0917  TROPONINI 0.04*    Microbiology Results  Results for orders placed or performed during the hospital encounter of 12/02/14  MRSA PCR Screening     Status: None   Collection Time: 12/02/14  9:42 PM  Result Value Ref Range Status   MRSA by PCR NEGATIVE NEGATIVE Final    Comment:        The GeneXpert MRSA Assay (FDA approved for NASAL specimens only), is one component of a comprehensive MRSA colonization surveillance program. It is not intended to diagnose MRSA infection nor to guide or monitor treatment for MRSA infections.     RADIOLOGY:  US Abdomen Limited Ruq  12/02/2014   CLINICAL DATA:  Elevated LFTs  EXAM: US ABDOMEN LIMITED - RIGHT UPPER QUADRANT  COMPARISON:  09/05/2014  FINDINGS: Gallbladder:  Mild gallbladder wall thickening/edema. No gallstones. Negative sonographic Murphy's sign.  Common bile duct:  Diameter: 4 mm  Liver:  No focal lesion identified. At the upper limits of normal for parenchymal echogenicity.  IMPRESSION: Mild gallbladder wall thickening, but without secondary findings to suggest acute cholecystitis.  Given the LFTs, this may be secondary to a right upper quadrant inflammatory process such as hepatitis.   Electronically Signed   By: Charline Bills M.D.   On: 12/02/2014 15:48    EKG:   Orders placed or performed during the hospital encounter of 12/02/14  . ED EKG within 10 minutes  . ED EKG within 10 minutes      Management plans discussed with the patient, family and they are in agreement.  CODE STATUS:     Code Status Orders        Start     Ordered   12/02/14 2134  Full code   Continuous     12/02/14 2133      TOTAL TIME TAKING CARE OF THIS PATIENT: 40 minutes.    Katharina Caper M.D on 12/04/2014 at 2:52 PM  Between 7am  to 6pm - Pager - [phone]  After 6pm go to [email or web address] - password [email or web address] [facility]  [place] [facility]  Office  [phone]  CC: Primary care physician; No PCP Per Patient

## 2014-12-04 NOTE — Progress Notes (Signed)
Assessment completed. SL lt ac flushes well. No distress on ra. Cardiac monitor in place, pt denies chest pain. Denies need at this time. CB in reach, SR up x 2.

## 2014-12-04 NOTE — Progress Notes (Signed)
Dr. Winona Legato paged re pt's concern about being dc today. Pt states she does not feel strong/stable enough on her feet to go home that she needs 1 more day.  Updated Dr. Winona Legato, transferred her to speak with pt regarding this.

## 2014-12-04 NOTE — Progress Notes (Signed)
Initial Nutrition Assessment    INTERVENTION:   Meals and Snacks: Cater to patient preferences Education: pt verbalizes understanding of current diet order, no education provided at this time   NUTRITION DIAGNOSIS:   No nutrition diagnosis at this time  GOAL:   Patient will meet greater than or equal to 90% of their needs   MONITOR:    (Energy Intake, Anthropometrics, Electrolyte/Renal Profile, Digestive System)  REASON FOR ASSESSMENT:   Malnutrition Screening Tool    ASSESSMENT:     Pt admitted with malignant HTN, acute gastroenteritis  Past Medical History  Diagnosis Date  . Stroke     may 2015  . Hypertension   . Thyroid disease   . H/O blood clots   . Anemia   . AVM (arteriovenous malformation)   . Anemia   . Renal failure   . PVD (peripheral vascular disease)   . Hyperkalemia   . Carotid artery stenosis    . Diet Order:  Diet Heart Room service appropriate?: Yes; Fluid consistency:: Thin Diet - low sodium heart healthy  Energy Intake: pt reports good appetite, recorded po intake 66% on average  Food and Nutrition Related History: pt reports good appetite and intake prior to admission  Digestive System: pt reports no N/V or abdominal pain at present, no problems chewing or swallowing  Electrolyte and Renal Profile:  Recent Labs Lab 12/02/14 1220 12/03/14 0313 12/04/14 1000  BUN 15 14  --   CREATININE 0.90 0.94  --   NA 138 141  --   K 3.4* 3.2* 3.9   Glucose Profile: No results for input(s): GLUCAP in the last 72 hours. Protein Profile:   Recent Labs Lab 12/02/14 1220 12/02/14 2206 12/04/14 1000  ALBUMIN 4.4 4.1 3.2*   Meds: MVI, carafate, potassium chloride  Skin:  Reviewed, no issues  Height:   Ht Readings from Last 1 Encounters:  12/02/14  (1.702 m)    Weight: pt reports weight has been stable; appears stable based on weight encounters  Wt Readings from Last 1 Encounters:  12/02/14 167 lb 12.3 oz (76.1 kg)     Wt Readings from Last 10 Encounters:  12/02/14 167 lb 12.3 oz (76.1 kg)  09/07/14 166 lb 1.6 oz (75.342 kg)    Ideal Body Weight:     BMI:  Body mass index is 26.27 kg/(m^2).  EDUCATION NEEDS:   No education needs identified at this time  LOW Care Level  Romelle Starcher MS, RD, LDN [phone] Pager

## 2014-12-04 NOTE — Care Management (Signed)
Patient from home with support of sister.  Plan for discharge today.  PT recommends SNF.  Patient refuses.  Patient not eligible for in home PT due to Medicaid.  Lyn RN CM provided patient with information on [facility].  Patient states that she will completed the packet and has transportation available.  Patient has walker to utilize at home.

## 2014-12-04 NOTE — Clinical Social Work Note (Signed)
CSW spoke to pt after receiving verbal SNF recommendation.  Pt was sitting up in bed Ox4.  Pt became upset at the thought of going to rehab and began to cry.  She does not want to go to rehab at this time.  She feels more comfortable going home with her sister.  Per pt, she is able to call 911, or her sister in case of an emergency.  She also stated that she would be able to get herself out of the home if there were an emergency.  CSW signing off.

## 2014-12-06 ENCOUNTER — Ambulatory Visit
Admission: RE | Admit: 2014-12-06 | Discharge: 2014-12-06 | Disposition: A | Discharge status: Home / Self Care | Payer: Medicaid Other | Source: Ambulatory Visit | Attending: Nurse Practitioner | Admitting: Nurse Practitioner

## 2014-12-06 DIAGNOSIS — Z1231 Encounter for screening mammogram for malignant neoplasm of breast: Secondary | ICD-10-CM | POA: Diagnosis present

## 2015-01-03 DIAGNOSIS — I071 Rheumatic tricuspid insufficiency: Secondary | ICD-10-CM | POA: Insufficient documentation

## 2015-01-03 DIAGNOSIS — I70219 Atherosclerosis of native arteries of extremities with intermittent claudication, unspecified extremity: Secondary | ICD-10-CM | POA: Insufficient documentation

## 2015-01-03 DIAGNOSIS — I739 Peripheral vascular disease, unspecified: Secondary | ICD-10-CM | POA: Insufficient documentation

## 2015-01-30 ENCOUNTER — Emergency Department
Admission: EM | Admit: 2015-01-30 | Discharge: 2015-01-30 | Disposition: A | Discharge status: Home / Self Care | Payer: Medicaid Other | Attending: Emergency Medicine | Admitting: Emergency Medicine

## 2015-01-30 DIAGNOSIS — M79672 Pain in left foot: Secondary | ICD-10-CM | POA: Diagnosis present

## 2015-01-30 DIAGNOSIS — I739 Peripheral vascular disease, unspecified: Secondary | ICD-10-CM | POA: Insufficient documentation

## 2015-01-30 DIAGNOSIS — G8929 Other chronic pain: Secondary | ICD-10-CM | POA: Insufficient documentation

## 2015-01-30 DIAGNOSIS — E039 Hypothyroidism, unspecified: Secondary | ICD-10-CM | POA: Diagnosis not present

## 2015-01-30 DIAGNOSIS — Z720 Tobacco use: Secondary | ICD-10-CM | POA: Diagnosis not present

## 2015-01-30 DIAGNOSIS — Z79899 Other long term (current) drug therapy: Secondary | ICD-10-CM | POA: Insufficient documentation

## 2015-01-30 DIAGNOSIS — D649 Anemia, unspecified: Secondary | ICD-10-CM | POA: Diagnosis not present

## 2015-01-30 DIAGNOSIS — E876 Hypokalemia: Secondary | ICD-10-CM | POA: Insufficient documentation

## 2015-01-30 DIAGNOSIS — Z760 Encounter for issue of repeat prescription: Secondary | ICD-10-CM | POA: Diagnosis not present

## 2015-01-30 DIAGNOSIS — I10 Essential (primary) hypertension: Secondary | ICD-10-CM | POA: Diagnosis not present

## 2015-01-30 MED ORDER — TRAMADOL HCL 50 MG PO TABS: 50.0000 mg | ORAL_TABLET | Freq: Four times a day (QID) | ORAL | Status: DC | PRN

## 2015-01-30 NOTE — ED Notes (Signed)
Pt states she has chronic pain in BL feet and has ran out of her tramadol and called PCP but they would not refill..states she is going out of town with family and want to be able to participate in activities but needs something to help relieve the pain.

## 2015-01-30 NOTE — Discharge Instructions (Signed)
Medicine Refill at the Emergency Department  We have refilled your medicine today, but it is best for you to get refills through your primary health care provider's office. In the future, please plan ahead so you do not need to get refills from the emergency department.  If the medicine we refilled was a maintenance medicine, you may have received only enough to get you by until you are able to see your regular health care provider.     This information is not intended to replace advice given to you by your health care provider. Make sure you discuss any questions you have with your health care provider.     Document Released: 07/31/2003 Document Revised: 05/04/2014 Document Reviewed: 07/21/2013  [employer]vier Interactive Patient Education 2016 [employer]

## 2015-01-30 NOTE — ED Provider Notes (Signed)
[facility] Emergency Department Provider Note  ____________________________________________  Time seen: Approximately 10:15 AM  I have reviewed the triage vital signs and the nursing notes.   HISTORY  Chief Complaint Foot Pain    HPI Kimberly Mccarthy is a 59 y.o. female requesting refill of tramadol for chronic foot pain.Patient states she is going on a tile floor family vacation and unable to get appointment with her PCP before departing tomorrow. Patient is rating the pain as 8/10. Describes pain as constant dull aching. Patient's stay in relation increases the pain.   Past Medical History  Diagnosis Date  . Stroke [facility])     may 2015  . Hypertension   . Thyroid disease   . H/O blood clots   . Anemia   . AVM (arteriovenous malformation)   . Anemia   . Renal failure   . PVD (peripheral vascular disease) (HCC)   . Hyperkalemia   . Carotid artery stenosis     Patient Active Problem List   Diagnosis Date Noted  . Chest pain 12/04/2014  . Acute gastroenteritis 12/04/2014  . Elevated liver enzymes   . Abdominal pain, epigastric   . HTN (hypertension), malignant 12/02/2014  . Accelerated hypertension 09/05/2014  . Hypertension 09/05/2014  . Gastritis without bleeding 09/05/2014  . Elevated troponin 09/05/2014  . AKI (acute kidney injury) (HCC) 09/05/2014  . Hypothyroidism 09/05/2014    Past Surgical History  Procedure Laterality Date  . Left foot surgery  02/2014  . Leg surgery Left   . Femoral endarterectomy Right     Current Outpatient Rx  Name  Route  Sig  Dispense  Refill  . amLODipine (NORVASC) 10 MG tablet   Oral   Take 10 mg by mouth daily.         . carvedilol (COREG) 25 MG tablet   Oral   Take 25 mg by mouth 2 (two) times daily with a meal.         . cloNIDine (CATAPRES) 0.3 MG tablet   Oral   Take 0.3 mg by mouth 2 (two) times daily.         Marland Kitchen docusate sodium (COLACE) 100 MG capsule   Oral   Take 100 mg by  mouth 2 (two) times daily.         . ferrous sulfate 325 (65 FE) MG tablet   Oral   Take 325 mg by mouth daily with breakfast.         . gabapentin (NEURONTIN) 300 MG capsule   Oral   Take 300 mg by mouth 3 (three) times daily.         . hydrALAZINE (APRESOLINE) 25 MG tablet   Oral   Take 1 tablet (25 mg total) by mouth every 8 (eight) hours.   90 tablet   0   . HYDROcodone-acetaminophen (NORCO) 10-325 MG per tablet   Oral   Take 0.5-1 tablets by mouth every 6 (six) hours as needed for severe pain.   20 tablet   0   . levothyroxine (SYNTHROID, LEVOTHROID) 50 MCG tablet   Oral   Take 50 mcg by mouth daily.         Marland Kitchen lisinopril (PRINIVIL,ZESTRIL) 40 MG tablet   Oral   Take 40 mg by mouth daily.         . Multiple Vitamin (MULTIVITAMIN WITH MINERALS) TABS tablet   Oral   Take 1 tablet by mouth daily.         Marland Kitchen  nicotine (NICODERM CQ - DOSED IN MG/24 HOURS) 21 mg/24hr patch   Transdermal   Place 1 patch (21 mg total) onto the skin daily.   28 patch   0   . ondansetron (ZOFRAN) 4 MG tablet   Oral   Take 1 tablet (4 mg total) by mouth every 8 (eight) hours as needed for nausea or vomiting.   30 tablet   1   . pantoprazole (PROTONIX) 40 MG tablet   Oral   Take 40 mg by mouth 2 (two) times daily.         . pravastatin (PRAVACHOL) 10 MG tablet   Oral   Take 10 mg by mouth at bedtime.         . sucralfate (CARAFATE) 1 G tablet   Oral   Take 1 g by mouth 3 (three) times daily.         . traMADol (ULTRAM) 50 MG tablet   Oral   Take 1 tablet (50 mg total) by mouth every 6 (six) hours as needed for moderate pain or severe pain.   30 tablet   3   . traMADol (ULTRAM) 50 MG tablet   Oral   Take 1 tablet (50 mg total) by mouth every 6 (six) hours as needed for moderate pain.   12 tablet   0     Allergies Review of patient's allergies indicates no known allergies.  Family History  Problem Relation Age of Onset  . Diabetes Mother   .  Hypertension Mother   . Hypertension Sister   . Thyroid disease Sister   . Breast cancer Neg Hx     Social History Social History  Substance Use Topics  . Smoking status: Current Every Day Smoker -- 1.00 packs/day for 20 years  . Smokeless tobacco: None  . Alcohol Use: No    Review of Systems Constitutional: No fever/chills Eyes: No visual changes. ENT: No sore throat. Cardiovascular: Denies chest pain. Respiratory: Denies shortness of breath. Gastrointestinal: No abdominal pain.  No nausea, no vomiting.  No diarrhea.  No constipation. Genitourinary: Negative for dysuria. Musculoskeletal: Negative for back pain. Skin: Negative for rash. Neurological: Negative for headaches, focal weakness or numbness. Endocrine:Hypothyroidism, hypertension and hypokalemia.  Hematological/Lymphatic: Peripheral vascular disease and anemia Allergic/Immunilogical: Anemia peripheral vascular disease.  10-point ROS otherwise negative.  ____________________________________________   PHYSICAL EXAM:  VITAL SIGNS: ED Triage Vitals  Enc Vitals Group     BP 01/30/15 0923 144/73 mmHg     Pulse Rate 01/30/15 0923 65     Resp 01/30/15 0923 18     Temp 01/30/15 0923 97.8 F (36.6 C)     Temp Source 01/30/15 0923 Oral     SpO2 01/30/15 0923 100 %     Weight 01/30/15 0923 172 lb (78.019 kg)     Height 01/30/15 0923  (1.702 m)     Head Cir --      Peak Flow --      Pain Score 01/30/15 0938 8     Pain Loc --      Pain Edu? --      Excl. in GC? --     Constitutional: Alert and oriented. Well appearing and in no acute distress. Eyes: Conjunctivae are normal. PERRL. EOMI. Head: Atraumatic. Nose: No congestion/rhinnorhea. Mouth/Throat: Mucous membranes are moist.  Oropharynx non-erythematous. Neck: No stridor. No cervical spine tenderness to palpation. Hematological/Lymphatic/Immunilogical: No cervical lymphadenopathy. Cardiovascular: Normal rate, regular rhythm. Grossly normal heart  sounds.  Good  peripheral circulation. Respiratory: Normal respiratory effort.  No retractions. Lungs CTAB. Gastrointestinal: Soft and nontender. No distention. No abdominal bruits. No CVA tenderness. Musculoskeletal: Bilateral  lower extremity tendernesswithout  edema. Surgical scars consistent with history.  Neurologic:  Normal speech and language. No gross focal neurologic deficits are appreciated. No gait instability. Skin:  Skin is warm, dry and intact. No rash noted. Psychiatric: Mood and affect are normal. Speech and behavior are normal.  ____________________________________________   LABS (all labs ordered are listed, but only abnormal results are displayed)  Labs Reviewed - No data to display ____________________________________________  EKG   ____________________________________________  RADIOLOGY   ____________________________________________   PROCEDURES  Procedure(s) performed: None  Critical Care performed: No  ____________________________________________   INITIAL IMPRESSION / ASSESSMENT AND PLAN / ED COURSE  Pertinent labs & imaging results that were available during my care of the patient were reviewed by me and considered in my medical decision making (see chart for details).  Medication refill. Patient advised three-day supply of tramadol will be prescribed.. Advised patient to follow-up family doctor upon return from her vacation.  ____________________________________________   FINAL CLINICAL IMPRESSION(S) / ED DIAGNOSES  Final diagnoses:  Encounter for medication refill      Joni Reining, PA-C 01/30/15 1026  Richardean Canal, MD 01/30/15 1447

## 2015-05-09 ENCOUNTER — Emergency Department: Payer: Medicare Other

## 2015-05-09 ENCOUNTER — Emergency Department
Admission: EM | Admit: 2015-05-09 | Discharge: 2015-05-09 | Disposition: A | Discharge status: Home / Self Care | Payer: Medicare Other | Attending: Emergency Medicine | Admitting: Emergency Medicine

## 2015-05-09 ENCOUNTER — Encounter: Payer: Self-pay | Admitting: Emergency Medicine

## 2015-05-09 DIAGNOSIS — R079 Chest pain, unspecified: Secondary | ICD-10-CM | POA: Diagnosis not present

## 2015-05-09 DIAGNOSIS — K739 Chronic hepatitis, unspecified: Secondary | ICD-10-CM | POA: Insufficient documentation

## 2015-05-09 DIAGNOSIS — Z79899 Other long term (current) drug therapy: Secondary | ICD-10-CM | POA: Diagnosis not present

## 2015-05-09 DIAGNOSIS — F1110 Opioid abuse, uncomplicated: Secondary | ICD-10-CM | POA: Insufficient documentation

## 2015-05-09 DIAGNOSIS — R109 Unspecified abdominal pain: Secondary | ICD-10-CM | POA: Diagnosis present

## 2015-05-09 DIAGNOSIS — F17200 Nicotine dependence, unspecified, uncomplicated: Secondary | ICD-10-CM | POA: Insufficient documentation

## 2015-05-09 DIAGNOSIS — R4182 Altered mental status, unspecified: Secondary | ICD-10-CM | POA: Diagnosis not present

## 2015-05-09 LAB — CBC WITH DIFFERENTIAL/PLATELET
BASOS PCT: 1 %
Basophils Absolute: 0 10*3/uL (ref 0–0.1)
EOS PCT: 1 %
Eosinophils Absolute: 0 10*3/uL (ref 0–0.7)
HEMATOCRIT: 41 % (ref 35.0–47.0)
Hemoglobin: 13.4 g/dL (ref 12.0–16.0)
Lymphocytes Relative: 18 %
Lymphs Abs: 1.1 10*3/uL (ref 1.0–3.6)
MCH: 28.8 pg (ref 26.0–34.0)
MCHC: 32.6 g/dL (ref 32.0–36.0)
MCV: 88.3 fL (ref 80.0–100.0)
MONO ABS: 0.7 10*3/uL (ref 0.2–0.9)
MONOS PCT: 11 %
NEUTROS ABS: 4.3 10*3/uL (ref 1.4–6.5)
Neutrophils Relative %: 69 %
Platelets: 235 10*3/uL (ref 150–440)
RBC: 4.64 MIL/uL (ref 3.80–5.20)
RDW: 15.1 % — AB (ref 11.5–14.5)
WBC: 6.2 10*3/uL (ref 3.6–11.0)

## 2015-05-09 LAB — URINALYSIS COMPLETE WITH MICROSCOPIC (ARMC ONLY)
BACTERIA UA: NONE SEEN
BILIRUBIN URINE: NEGATIVE
GLUCOSE, UA: NEGATIVE mg/dL
HGB URINE DIPSTICK: NEGATIVE
Ketones, ur: NEGATIVE mg/dL
Leukocytes, UA: NEGATIVE
NITRITE: NEGATIVE
Protein, ur: NEGATIVE mg/dL
RBC / HPF: NONE SEEN RBC/hpf (ref 0–5)
Specific Gravity, Urine: 1.008 (ref 1.005–1.030)
pH: 7 (ref 5.0–8.0)

## 2015-05-09 LAB — URINE DRUG SCREEN, QUALITATIVE (ARMC ONLY)
AMPHETAMINES, UR SCREEN: NOT DETECTED
Barbiturates, Ur Screen: NOT DETECTED
Benzodiazepine, Ur Scrn: NOT DETECTED
COCAINE METABOLITE, UR ~~LOC~~: NOT DETECTED
Cannabinoid 50 Ng, Ur ~~LOC~~: NOT DETECTED
MDMA (Ecstasy)Ur Screen: NOT DETECTED
METHADONE SCREEN, URINE: NOT DETECTED
OPIATE, UR SCREEN: POSITIVE — AB
Phencyclidine (PCP) Ur S: NOT DETECTED
Tricyclic, Ur Screen: NOT DETECTED

## 2015-05-09 LAB — TROPONIN I: Troponin I: <0.03 ng/mL (ref <0.031)

## 2015-05-09 LAB — COMPREHENSIVE METABOLIC PANEL
ALK PHOS: 124 U/L (ref 38–126)
ALT: 182 U/L — AB (ref 14–54)
AST: 349 U/L — AB (ref 15–41)
Albumin: 4.4 g/dL (ref 3.5–5.0)
Anion gap: 7 (ref 5–15)
BILIRUBIN TOTAL: 1 mg/dL (ref 0.3–1.2)
BUN: 21 mg/dL — AB (ref 6–20)
CALCIUM: 9.7 mg/dL (ref 8.9–10.3)
CHLORIDE: 102 mmol/L (ref 101–111)
CO2: 30 mmol/L (ref 22–32)
CREATININE: 1.15 mg/dL — AB (ref 0.44–1.00)
GFR calc Af Amer: 59 mL/min — ABNORMAL LOW (ref >60)
GFR, EST NON AFRICAN AMERICAN: 51 mL/min — AB (ref >60)
Glucose, Bld: 107 mg/dL — ABNORMAL HIGH (ref 65–99)
Potassium: 4.4 mmol/L (ref 3.5–5.1)
Sodium: 139 mmol/L (ref 135–145)
TOTAL PROTEIN: 8.3 g/dL — AB (ref 6.5–8.1)

## 2015-05-09 LAB — AMMONIA: AMMONIA: 17 umol/L (ref 9–35)

## 2015-05-09 LAB — LIPASE, BLOOD: LIPASE: 29 U/L (ref 11–51)

## 2015-05-09 LAB — SALICYLATE LEVEL: Salicylate Lvl: <4 mg/dL (ref 2.8–30.0)

## 2015-05-09 LAB — ACETAMINOPHEN LEVEL: Acetaminophen (Tylenol), Serum: <10 ug/mL — ABNORMAL LOW (ref 10–30)

## 2015-05-09 LAB — ETHANOL: Alcohol, Ethyl (B): <5 mg/dL (ref <5)

## 2015-05-09 MED ORDER — IOHEXOL 350 MG/ML SOLN
125.0000 mL | Freq: Once | INTRAVENOUS | Status: AC | PRN
  Administered 2015-05-09: 125 mL via INTRAVENOUS

## 2015-05-09 MED ORDER — MORPHINE SULFATE (PF) 4 MG/ML IV SOLN
4.0000 mg | Freq: Once | INTRAVENOUS | Status: AC
  Administered 2015-05-09: 4 mg via INTRAVENOUS
  Filled 2015-05-09: qty 1

## 2015-05-09 MED ORDER — SODIUM CHLORIDE 0.9 % IV BOLUS (SEPSIS)
1000.0000 mL | Freq: Once | INTRAVENOUS | Status: AC
  Administered 2015-05-09: 1000 mL via INTRAVENOUS

## 2015-05-09 MED ORDER — RANITIDINE HCL 150 MG PO CAPS: 150.0000 mg | ORAL_CAPSULE | Freq: Two times a day (BID) | ORAL | Status: DC

## 2015-05-09 MED ORDER — FAMOTIDINE 20 MG PO TABS
40.0000 mg | ORAL_TABLET | Freq: Once | ORAL | Status: AC
  Administered 2015-05-09: 40 mg via ORAL
  Filled 2015-05-09: qty 2

## 2015-05-09 MED ORDER — ONDANSETRON 8 MG PO TBDP: 8.0000 mg | ORAL_TABLET | Freq: Three times a day (TID) | ORAL | Status: DC | PRN

## 2015-05-09 MED ORDER — GI COCKTAIL ~~LOC~~
30.0000 mL | ORAL | Status: AC
  Administered 2015-05-09: 30 mL via ORAL
  Filled 2015-05-09: qty 30

## 2015-05-09 NOTE — ED Notes (Signed)
PO meds given for abd pain   No nausea/vomiting

## 2015-05-09 NOTE — Discharge Instructions (Signed)
Abdominal Pain, Adult Many things can cause abdominal pain. Usually, abdominal pain is not caused by a disease and will improve without treatment. It can often be observed and treated at home. Your health care provider will do a physical exam and possibly order blood tests and X-rays to help determine the seriousness of your pain. However, in many cases, more time must pass before a clear cause of the pain can be found. Before that point, your health care provider may not know if you need more testing or further treatment. HOME CARE INSTRUCTIONS Monitor your abdominal pain for any changes. The following actions may help to alleviate any discomfort you are experiencing:  Only take over-the-counter or prescription medicines as directed by your health care provider.  Do not take laxatives unless directed to do so by your health care provider.  Try a clear liquid diet (broth, tea, or water) as directed by your health care provider. Slowly move to a bland diet as tolerated. SEEK MEDICAL CARE IF:  You have unexplained abdominal pain.  You have abdominal pain associated with nausea or diarrhea.  You have pain when you urinate or have a bowel movement.  You experience abdominal pain that wakes you in the night.  You have abdominal pain that is worsened or improved by eating food.  You have abdominal pain that is worsened with eating fatty foods.  You have a fever. SEEK IMMEDIATE MEDICAL CARE IF:  Your pain does not go away within 2 hours.  You keep throwing up (vomiting).  Your pain is felt only in portions of the abdomen, such as the right side or the left lower portion of the abdomen.  You pass bloody or black tarry stools. MAKE SURE YOU:  Understand these instructions.  Will watch your condition.  Will get help right away if you are not doing well or get worse.   This information is not intended to replace advice given to you by your health care provider. Make sure you discuss  any questions you have with your health care provider.   Document Released: 01/21/2005 Document Revised: 01/02/2015 Document Reviewed: 12/21/2012 Elsevier Interactive Patient Education 2016 [employer] Inc[employer]  Nonspecific Chest Pain  Chest pain can be caused by many different conditions. There is always a chance that your pain could be related to something serious, such as a heart attack or a blood clot in your lungs. Chest pain can also be caused by conditions that are not life-threatening. If you have chest pain, it is very important to follow up with your health care provider. CAUSES  Chest pain can be caused by:  Heartburn.  Pneumonia or bronchitis.  Anxiety or stress.  Inflammation around your heart (pericarditis) or lung (pleuritis or pleurisy).  A blood clot in your lung.  A collapsed lung (pneumothorax). It can develop suddenly on its own (spontaneous pneumothorax) or from trauma to the chest.  Shingles infection (varicella-zoster virus).  Heart attack.  Damage to the bones, muscles, and cartilage that make up your chest wall. This can include:  Bruised bones due to injury.  Strained muscles or cartilage due to frequent or repeated coughing or overwork.  Fracture to one or more ribs.  Sore cartilage due to inflammation (costochondritis). RISK FACTORS  Risk factors for chest pain may include:  Activities that increase your risk for trauma or injury to your chest.  Respiratory infections or conditions that cause frequent coughing.  Medical conditions or overeating that can cause heartburn.  Heart disease or family  history of heart disease.  Conditions or health behaviors that increase your risk of developing a blood clot.  Having had chicken pox (varicella zoster). SIGNS AND SYMPTOMS Chest pain can feel like:  Burning or tingling on the surface of your chest or deep in your chest.  Crushing, pressure, aching, or squeezing pain.  Dull or sharp pain that is  worse when you move, cough, or take a deep breath.  Pain that is also felt in your back, neck, shoulder, or arm, or pain that spreads to any of these areas. Your chest pain may come and go, or it may stay constant. DIAGNOSIS Lab tests or other studies may be needed to find the cause of your pain. Your health care provider may have you take a test called an ambulatory ECG (electrocardiogram). An ECG records your heartbeat patterns at the time the test is performed. You may also have other tests, such as:  Transthoracic echocardiogram (TTE). During echocardiography, sound waves are used to create a picture of all of the heart structures and to look at how blood flows through your heart.  Transesophageal echocardiogram (TEE).This is a more advanced imaging test that obtains images from inside your body. It allows your health care provider to see your heart in finer detail.  Cardiac monitoring. This allows your health care provider to monitor your heart rate and rhythm in real time.  Holter monitor. This is a portable device that records your heartbeat and can help to diagnose abnormal heartbeats. It allows your health care provider to track your heart activity for several days, if needed.  Stress tests. These can be done through exercise or by taking medicine that makes your heart beat more quickly.  Blood tests.  Imaging tests. TREATMENT  Your treatment depends on what is causing your chest pain. Treatment may include:  Medicines. These may include:  Acid blockers for heartburn.  Anti-inflammatory medicine.  Pain medicine for inflammatory conditions.  Antibiotic medicine, if an infection is present.  Medicines to dissolve blood clots.  Medicines to treat coronary artery disease.  Supportive care for conditions that do not require medicines. This may include:  Resting.  Applying heat or cold packs to injured areas.  Limiting activities until pain decreases. HOME CARE  INSTRUCTIONS  If you were prescribed an antibiotic medicine, finish it all even if you start to feel better.  Avoid any activities that bring on chest pain.  Do not use any tobacco products, including cigarettes, chewing tobacco, or electronic cigarettes. If you need help quitting, ask your health care provider.  Do not drink alcohol.  Take medicines only as directed by your health care provider.  Keep all follow-up visits as directed by your health care provider. This is important. This includes any further testing if your chest pain does not go away.  If heartburn is the cause for your chest pain, you may be told to keep your head raised (elevated) while sleeping. This reduces the chance that acid will go from your stomach into your esophagus.  Make lifestyle changes as directed by your health care provider. These may include:  Getting regular exercise. Ask your health care provider to suggest some activities that are safe for you.  Eating a heart-healthy diet. A registered dietitian can help you to learn healthy eating options.  Maintaining a healthy weight.  Managing diabetes, if necessary.  Reducing stress. SEEK MEDICAL CARE IF:  Your chest pain does not go away after treatment.  You have a rash with  blisters on your chest.  You have a fever. SEEK IMMEDIATE MEDICAL CARE IF:   Your chest pain is worse.  You have an increasing cough, or you cough up blood.  You have severe abdominal pain.  You have severe weakness.  You faint.  You have chills.  You have sudden, unexplained chest discomfort.  You have sudden, unexplained discomfort in your arms, back, neck, or jaw.  You have shortness of breath at any time.  You suddenly start to sweat, or your skin gets clammy.  You feel nauseous or you vomit.  You suddenly feel light-headed or dizzy.  Your heart begins to beat quickly, or it feels like it is skipping beats. These symptoms may represent a serious  problem that is an emergency. Do not wait to see if the symptoms will go away. Get medical help right away. Call your local emergency services (911 in the [place] Do not drive yourself to the hospital.   This information is not intended to replace advice given to you by your health care provider. Make sure you discuss any questions you have with your health care provider.   Document Released: 01/21/2005 Document Revised: 05/04/2014 Document Reviewed: 11/17/2013 Elsevier Interactive Patient Education 2016 Elsevier Inc.  Pain Without a Known Cause WHAT IS PAIN WITHOUT A KNOWN CAUSE? Pain can occur in any part of the body and can range from mild to severe. Sometimes no cause can be found for why you are having pain. Some types of pain that can occur without a known cause include:   Headache.  Back pain.  Abdominal pain.  Neck pain. HOW IS PAIN WITHOUT A KNOWN CAUSE DIAGNOSED?  Your health care provider will try to find the cause of your pain. This may include:  Physical exam.  Medical history.  Blood tests.  Urine tests.  X-rays. If no cause is found, your health care provider may diagnose you with pain without a known cause.  IS THERE TREATMENT FOR PAIN WITHOUT A CAUSE?  Treatment depends on the kind of pain you have. Your health care provider may prescribe medicines to help relieve your pain.  WHAT CAN I DO AT HOME FOR MY PAIN?   Take medicines only as directed by your health care provider.  Stop any activities that cause pain. During periods of severe pain, bed rest may help.  Try to reduce your stress with activities such as yoga or meditation. Talk to your health care provider for other stress-reducing activity recommendations.  Exercise regularly, if approved by your health care provider.  Eat a healthy diet that includes fruits and vegetables. This may improve pain. Talk to your health care provider if you have any questions about your diet. WHAT IF MY PAIN DOES NOT  GET BETTER?  If you have a painful condition and no reason can be found for the pain or the pain gets worse, it is important to follow up with your health care provider. It may be necessary to repeat tests and look further for a possible cause.    This information is not intended to replace advice given to you by your health care provider. Make sure you discuss any questions you have with your health care provider.   Document Released: 01/06/2001 Document Revised: 05/04/2014 Document Reviewed: 08/29/2013 Elsevier Interactive Patient Education [employer]

## 2015-05-09 NOTE — ED Provider Notes (Signed)
[facility] Emergency Department Provider Note  ____________________________________________  Time seen: 8:15 AM  I have reviewed the triage vital signs and the nursing notes.   HISTORY  Chief Complaint Abdominal Pain    HPI Kimberly Mccarthy is a 60 y.o. female who complains of epigastric abdominal pain as well as some anterior chest pain. No back pain. This all started about 4 AM today and has been constant since then. EMS also reports some episodes of altered mental status with rapid return to baseline. Denies vomiting shortness of breath or syncope. Pain is cramping and sharp, waxing and waning and constant. No vomiting. No diarrhea. Severe in intensity     Past Medical History  Diagnosis Date  . Stroke [facility])     may 2015  . Hypertension   . Thyroid disease   . H/O blood clots   . Anemia   . AVM (arteriovenous malformation)   . Anemia   . Renal failure   . PVD (peripheral vascular disease) (HCC)   . Hyperkalemia   . Carotid artery stenosis      Patient Active Problem List   Diagnosis Date Noted  . Chest pain 12/04/2014  . Acute gastroenteritis 12/04/2014  . Elevated liver enzymes   . Abdominal pain, epigastric   . HTN (hypertension), malignant 12/02/2014  . Accelerated hypertension 09/05/2014  . Hypertension 09/05/2014  . Gastritis without bleeding 09/05/2014  . Elevated troponin 09/05/2014  . AKI (acute kidney injury) (HCC) 09/05/2014  . Hypothyroidism 09/05/2014     Past Surgical History  Procedure Laterality Date  . Left foot surgery  02/2014  . Leg surgery Left   . Femoral endarterectomy Right      Current Outpatient Rx  Name  Route  Sig  Dispense  Refill  . amLODipine (NORVASC) 10 MG tablet   Oral   Take 10 mg by mouth daily.         . carvedilol (COREG) 25 MG tablet   Oral   Take 25 mg by mouth 2 (two) times daily with a meal.         . cloNIDine (CATAPRES) 0.3 MG tablet   Oral   Take 0.3 mg by mouth 2  (two) times daily.         Marland Kitchen docusate sodium (COLACE) 100 MG capsule   Oral   Take 100 mg by mouth 2 (two) times daily.         . ferrous sulfate 325 (65 FE) MG tablet   Oral   Take 325 mg by mouth daily with breakfast.         . gabapentin (NEURONTIN) 300 MG capsule   Oral   Take 300 mg by mouth 3 (three) times daily.         . hydrALAZINE (APRESOLINE) 25 MG tablet   Oral   Take 1 tablet (25 mg total) by mouth every 8 (eight) hours.   90 tablet   0   . levothyroxine (SYNTHROID, LEVOTHROID) 50 MCG tablet   Oral   Take 50 mcg by mouth daily.         . Multiple Vitamin (MULTIVITAMIN WITH MINERALS) TABS tablet   Oral   Take 1 tablet by mouth daily.         . pantoprazole (PROTONIX) 40 MG tablet   Oral   Take 40 mg by mouth 2 (two) times daily.         . pravastatin (PRAVACHOL) 10 MG tablet   Oral  Take 10 mg by mouth at bedtime.         Marland Kitchen HYDROcodone-acetaminophen (NORCO) 10-325 MG per tablet   Oral   Take 0.5-1 tablets by mouth every 6 (six) hours as needed for severe pain. Patient not taking: Reported on 05/09/2015   20 tablet   0   . nicotine (NICODERM CQ - DOSED IN MG/24 HOURS) 21 mg/24hr patch   Transdermal   Place 1 patch (21 mg total) onto the skin daily. Patient not taking: Reported on 05/09/2015   28 patch   0   . ondansetron (ZOFRAN ODT) 8 MG disintegrating tablet   Oral   Take 1 tablet (8 mg total) by mouth every 8 (eight) hours as needed for nausea or vomiting.   20 tablet   0   . ondansetron (ZOFRAN) 4 MG tablet   Oral   Take 1 tablet (4 mg total) by mouth every 8 (eight) hours as needed for nausea or vomiting. Patient not taking: Reported on 05/09/2015   30 tablet   1   . ranitidine (ZANTAC) 150 MG capsule   Oral   Take 1 capsule (150 mg total) by mouth 2 (two) times daily.   28 capsule   0   . traMADol (ULTRAM) 50 MG tablet   Oral   Take 1 tablet (50 mg total) by mouth every 6 (six) hours as needed for moderate pain or  severe pain. Patient not taking: Reported on 05/09/2015   30 tablet   3   . traMADol (ULTRAM) 50 MG tablet   Oral   Take 1 tablet (50 mg total) by mouth every 6 (six) hours as needed for moderate pain. Patient not taking: Reported on 05/09/2015   12 tablet   0      Allergies Review of patient's allergies indicates no known allergies.   Family History  Problem Relation Age of Onset  . Diabetes Mother   . Hypertension Mother   . Hypertension Sister   . Thyroid disease Sister   . Breast cancer Neg Hx     Social History Social History  Substance Use Topics  . Smoking status: Current Every Day Smoker -- 1.00 packs/day for 20 years  . Smokeless tobacco: None  . Alcohol Use: No    Review of Systems  Constitutional:   No fever or chills. No weight changes Eyes:   No blurry vision or double vision.  ENT:   No sore throat. Cardiovascular:   Positive chest pain. Respiratory:   No dyspnea or cough. Gastrointestinal:   Positive for abdominal pain, without vomiting and diarrhea.  No BRBPR or melena. Genitourinary:   Negative for dysuria, urinary retention, bloody urine, or difficulty urinating. Musculoskeletal:   Negative for back pain. No joint swelling or pain. Skin:   Negative for rash. Neurological:   Negative for headaches, focal weakness or numbness. Psychiatric:  No anxiety or depression.   Endocrine:  No hot/cold intolerance, changes in energy, or sleep difficulty.  10-point ROS otherwise negative.  ____________________________________________   PHYSICAL EXAM:  VITAL SIGNS: ED Triage Vitals  Enc Vitals Group     BP 05/09/15 0822 192/87 mmHg     Pulse Rate 05/09/15 0822 70     Resp 05/09/15 0822 22     Temp 05/09/15 0822 97 F (36.1 C)     Temp Source 05/09/15 0822 Oral     SpO2 05/09/15 0822 100 %     Weight 05/09/15 0822 200 lb (90.719 kg)  Height 05/09/15 0822 5\' 7"  (1.702 m)     Head Cir --      Peak Flow --      Pain Score 05/09/15 0823 10      Pain Loc --      Pain Edu? --      Excl. in GC? --     Vital signs reviewed, nursing assessments reviewed.   Constitutional:   Alert and oriented. Well appearing and in no distress. Eyes:   No scleral icterus. No conjunctival pallor. PERRL. EOMI ENT   Head:   Normocephalic and atraumatic.   Nose:   No congestion/rhinnorhea. No septal hematoma   Mouth/Throat:   MMM, no pharyngeal erythema. No peritonsillar mass. No uvula shift.   Neck:   No stridor. No SubQ emphysema. No meningismus. Hematological/Lymphatic/Immunilogical:   No cervical lymphadenopathy. Cardiovascular:   RRR. Normal and symmetric distal pulses are present in all extremities. No murmurs, rubs, or gallops. Respiratory:   Normal respiratory effort without tachypnea nor retractions. Breath sounds are clear and equal bilaterally. No wheezes/rales/rhonchi. Gastrointestinal:   Soft with epigastric tenderness. No distention. There is no CVA tenderness.  No rebound, rigidity, or guarding. Genitourinary:   deferred Musculoskeletal:   Nontender with normal range of motion in all extremities. No joint effusions.  No lower extremity tenderness.  No edema. Neurologic:   Normal speech and language.  CN 2-10 normal. Motor grossly intact. No pronator drift.  Normal gait. No gross focal neurologic deficits are appreciated.  Skin:    Skin is warm, dry and intact. No rash noted.  No petechiae, purpura, or bullae. Psychiatric:   Mood and affect are normal. Speech and behavior are normal. Patient exhibits appropriate insight and judgment.  ____________________________________________    LABS (pertinent positives/negatives) (all labs ordered are listed, but only abnormal results are displayed) Labs Reviewed  COMPREHENSIVE METABOLIC PANEL - Abnormal; Notable for the following:    Glucose, Bld 107 (*)    BUN 21 (*)    Creatinine, Ser 1.15 (*)    Total Protein 8.3 (*)    AST 349 (*)    ALT 182 (*)    GFR calc non Af Amer  51 (*)    GFR calc Af Amer 59 (*)    All other components within normal limits  CBC WITH DIFFERENTIAL/PLATELET - Abnormal; Notable for the following:    RDW 15.1 (*)    All other components within normal limits  URINALYSIS COMPLETEWITH MICROSCOPIC ([facility] ONLY) - Abnormal; Notable for the following:    Color, Urine YELLOW (*)    APPearance CLEAR (*)    Squamous Epithelial / LPF 0-5 (*)    All other components within normal limits  URINE DRUG SCREEN, QUALITATIVE ([facility] ONLY) - Abnormal; Notable for the following:    Opiate, Ur Screen POSITIVE (*)    All other components within normal limits  ACETAMINOPHEN LEVEL - Abnormal; Notable for the following:    Acetaminophen (Tylenol), Serum <10 (*)    All other components within normal limits  ETHANOL  LIPASE, BLOOD  TROPONIN I  SALICYLATE LEVEL  AMMONIA   ____________________________________________   EKG  Interpreted by me  Date: 05/09/2015  Rate: 66  Rhythm: normal sinus rhythm  QRS Axis: normal  Intervals: normal  ST/T Wave abnormalities: normal  Conduction Disutrbances: none  Narrative Interpretation: unremarkable      ____________________________________________    RADIOLOGY  CT head unremarkable CT angiogram chest abdomen pelvis unremarkable  ____________________________________________   PROCEDURES   ____________________________________________  INITIAL IMPRESSION / ASSESSMENT AND PLAN / ED COURSE  Pertinent labs & imaging results that were available during my care of the patient were reviewed by me and considered in my medical decision making (see chart for details).  Patient presents with chest and abdomen pain with severe in intensity. She is a vasculopath by past medical history. With concerns for episodic altered mental status will check a CT head as well as abdomen and pelvis angiogram. We'll check labs.  ----------------------------------------- 1:27 PM on  05/09/2015 -----------------------------------------  Work up essentially unremarkable. Transaminases are slightly elevated which is been an issue in the past. This is nonspecific and a prior hepatitis panel was negative. We'll have her follow-up with Dr. Aggie Cosier regarding the hepatitis. No evidence of any ACS PE TAD pneumothorax carditis mediastinitis pneumonia sepsis AAA obstruction perforation biliary pathology or appendicitis. We'll start on a course of antacids and anti-emetics and have her follow up.     ____________________________________________   FINAL CLINICAL IMPRESSION(S) / ED DIAGNOSES  Final diagnoses:  Abdominal pain  Chest pain  Chronic hepatitis, unspecified (HCC)      Sharman Cheek, MD 05/09/15 1328

## 2015-05-09 NOTE — ED Notes (Signed)
Brought in via ems from home with upper abd pain since about 4 am   Also per ems she has had at least 2 episodes of AMS   On arrival a/o

## 2015-06-06 ENCOUNTER — Other Ambulatory Visit: Payer: Self-pay | Admitting: Anesthesiology

## 2015-06-06 ENCOUNTER — Ambulatory Visit: Payer: Medicare Other | Attending: Anesthesiology | Admitting: Anesthesiology

## 2015-06-06 VITALS — BP 162/74 | HR 64 | Temp 97.5°F | Resp 18 | Ht 67.0 in | Wt 207.0 lb

## 2015-06-06 DIAGNOSIS — M25562 Pain in left knee: Secondary | ICD-10-CM

## 2015-06-06 DIAGNOSIS — M25561 Pain in right knee: Secondary | ICD-10-CM

## 2015-06-06 DIAGNOSIS — F17200 Nicotine dependence, unspecified, uncomplicated: Secondary | ICD-10-CM | POA: Insufficient documentation

## 2015-06-06 DIAGNOSIS — Z720 Tobacco use: Secondary | ICD-10-CM

## 2015-06-06 DIAGNOSIS — K219 Gastro-esophageal reflux disease without esophagitis: Secondary | ICD-10-CM | POA: Diagnosis not present

## 2015-06-06 DIAGNOSIS — Z8673 Personal history of transient ischemic attack (TIA), and cerebral infarction without residual deficits: Secondary | ICD-10-CM | POA: Insufficient documentation

## 2015-06-06 DIAGNOSIS — M79604 Pain in right leg: Secondary | ICD-10-CM | POA: Insufficient documentation

## 2015-06-06 DIAGNOSIS — M79672 Pain in left foot: Secondary | ICD-10-CM | POA: Diagnosis present

## 2015-06-06 DIAGNOSIS — M79605 Pain in left leg: Secondary | ICD-10-CM | POA: Diagnosis not present

## 2015-06-06 DIAGNOSIS — I739 Peripheral vascular disease, unspecified: Secondary | ICD-10-CM | POA: Insufficient documentation

## 2015-06-06 DIAGNOSIS — I639 Cerebral infarction, unspecified: Secondary | ICD-10-CM

## 2015-06-06 DIAGNOSIS — M79671 Pain in right foot: Secondary | ICD-10-CM | POA: Diagnosis present

## 2015-06-06 MED ORDER — OXYCODONE HCL 5 MG PO TABS: 5.0000 mg | ORAL_TABLET | Freq: Two times a day (BID) | ORAL | Status: DC | PRN

## 2015-06-06 NOTE — Patient Instructions (Addendum)
GENERAL RISKS AND COMPLICATIONS  What are the risk, side effects and possible complications? Generally speaking, most procedures are safe.  However, with any procedure there are risks, side effects, and the possibility of complications.  The risks and complications are dependent upon the sites that are lesioned, or the type of nerve block to be performed.  The closer the procedure is to the spine, the more serious the risks are.  Great care is taken when placing the radio frequency needles, block needles or lesioning probes, but sometimes complications can occur.  Infection: Any time there is an injection through the skin, there is a risk of infection.  This is why sterile conditions are used for these blocks.  There are four possible types of infection.  Localized skin infection.  Central Nervous System Infection-This can be in the form of Meningitis, which can be deadly.  Epidural Infections-This can be in the form of an epidural abscess, which can cause pressure inside of the spine, causing compression of the spinal cord with subsequent paralysis. This would require an emergency surgery to decompress, and there are no guarantees that the patient would recover from the paralysis.  Discitis-This is an infection of the intervertebral discs.  It occurs in about 1% of discography procedures.  It is difficult to treat and it may lead to surgery.        2. Pain: the needles have to go through skin and soft tissues, will cause soreness.       3. Damage to internal structures:  The nerves to be lesioned may be near blood vessels or    other nerves which can be potentially damaged.       4. Bleeding: Bleeding is more common if the patient is taking blood thinners such as  aspirin, Coumadin, Ticiid, Plavix, etc., or if he/she have some genetic predisposition  such as hemophilia. Bleeding into the spinal canal can cause compression of the spinal  cord with subsequent paralysis.  This would require an  emergency surgery to  decompress and there are no guarantees that the patient would recover from the  paralysis.       5. Pneumothorax:  Puncturing of a lung is a possibility, every time a needle is introduced in  the area of the chest or upper back.  Pneumothorax refers to free air around the  collapsed lung(s), inside of the thoracic cavity (chest cavity).  Another two possible  complications related to a similar event would include: Hemothorax and Chylothorax.   These are variations of the Pneumothorax, where instead of air around the collapsed  lung(s), you may have blood or chyle, respectively.       6. Spinal headaches: They may occur with any procedures in the area of the spine.       7. Persistent CSF (Cerebro-Spinal Fluid) leakage: This is a rare problem, but may occur  with prolonged intrathecal or epidural catheters either due to the formation of a fistulous  track or a dural tear.       8. Nerve damage: By working so close to the spinal cord, there is always a possibility of  nerve damage, which could be as serious as a permanent spinal cord injury with  paralysis.       9. Death:  Although rare, severe deadly allergic reactions known as "Anaphylactic  reaction" can occur to any of the medications used.      10. Worsening of the symptoms:  We can always make thing worse.    What are the chances of something like this happening? Chances of any of this occuring are extremely low.  By statistics, you have more of a chance of getting killed in a motor vehicle accident: while driving to the hospital than any of the above occurring .  Nevertheless, you should be aware that they are possibilities.  In general, it is similar to taking a shower.  Everybody knows that you can slip, hit your head and get killed.  Does that mean that you should not shower again?  Nevertheless always keep in mind that statistics do not mean anything if you happen to be on the wrong side of them.  Even if a procedure has a 1  (one) in a 1,000,000 (million) chance of going wrong, it you happen to be that one..Also, keep in mind that by statistics, you have more of a chance of having something go wrong when taking medications.  Who should not have this procedure? If you are on a blood thinning medication (e.g. Coumadin, Plavix, see list of "Blood Thinners"), or if you have an active infection going on, you should not have the procedure.  If you are taking any blood thinners, please inform your physician.  How should I prepare for this procedure?  Do not eat or drink anything at least six hours prior to the procedure.  Bring a driver with you .  It cannot be a taxi.  Come accompanied by an adult that can drive you back, and that is strong enough to help you if your legs get weak or numb from the local anesthetic.  Take all of your medicines the morning of the procedure with just enough water to swallow them.  If you have diabetes, make sure that you are scheduled to have your procedure done first thing in the morning, whenever possible.  If you have diabetes, take only half of your insulin dose and notify our nurse that you have done so as soon as you arrive at the clinic.  If you are diabetic, but only take blood sugar pills (oral hypoglycemic), then do not take them on the morning of your procedure.  You may take them after you have had the procedure.  Do not take aspirin or any aspirin-containing medications, at least eleven (11) days prior to the procedure.  They may prolong bleeding.  Wear loose fitting clothing that may be easy to take off and that you would not mind if it got stained with Betadine or blood.  Do not wear any jewelry or perfume  Remove any nail coloring.  It will interfere with some of our monitoring equipment.  NOTE: Remember that this is not meant to be interpreted as a complete list of all possible complications.  Unforeseen problems may occur.  BLOOD THINNERS The following drugs  contain aspirin or other products, which can cause increased bleeding during surgery and should not be taken for 2 weeks prior to and 1 week after surgery.  If you should need take something for relief of minor pain, you may take acetaminophen which is found in Tylenol,m Datril, Anacin-3 and Panadol. It is not blood thinner. The products listed below are.  Do not take any of the products listed below in addition to any listed on your instruction sheet.  A.P.C or A.P.C with Codeine Codeine Phosphate Capsules #3 Ibuprofen Ridaura  ABC compound Congesprin Imuran rimadil  Advil Cope Indocin Robaxisal  Alka-Seltzer Effervescent Pain Reliever and Antacid Coricidin or Coricidin-D  Indomethacin Rufen    Alka-Seltzer plus Cold Medicine Cosprin Ketoprofen S-A-C Tablets  Anacin Analgesic Tablets or Capsules Coumadin Korlgesic Salflex  Anacin Extra Strength Analgesic tablets or capsules CP-2 Tablets Lanoril Salicylate  Anaprox Cuprimine Capsules Levenox Salocol  Anexsia-D Dalteparin Magan Salsalate  Anodynos Darvon compound Magnesium Salicylate Sine-off  Ansaid Dasin Capsules Magsal Sodium Salicylate  Anturane Depen Capsules Marnal Soma  APF Arthritis pain formula Dewitt's Pills Measurin Stanback  Argesic Dia-Gesic Meclofenamic Sulfinpyrazone  Arthritis Bayer Timed Release Aspirin Diclofenac Meclomen Sulindac  Arthritis pain formula Anacin Dicumarol Medipren Supac  Analgesic (Safety coated) Arthralgen Diffunasal Mefanamic Suprofen  Arthritis Strength Bufferin Dihydrocodeine Mepro Compound Suprol  Arthropan liquid Dopirydamole Methcarbomol with Aspirin Synalgos  ASA tablets/Enseals Disalcid Micrainin Tagament  Ascriptin Doan's Midol Talwin  Ascriptin A/D Dolene Mobidin Tanderil  Ascriptin Extra Strength Dolobid Moblgesic Ticlid  Ascriptin with Codeine Doloprin or Doloprin with Codeine Momentum Tolectin  Asperbuf Duoprin Mono-gesic Trendar  Aspergum Duradyne Motrin or Motrin IB Triminicin  Aspirin  plain, buffered or enteric coated Durasal Myochrisine Trigesic  Aspirin Suppositories Easprin Nalfon Trillsate  Aspirin with Codeine Ecotrin Regular or Extra Strength Naprosyn Uracel  Atromid-S Efficin Naproxen Ursinus  Auranofin Capsules Elmiron Neocylate Vanquish  Axotal Emagrin Norgesic Verin  Azathioprine Empirin or Empirin with Codeine Normiflo Vitamin E  Azolid Emprazil Nuprin Voltaren  Bayer Aspirin plain, buffered or children's or timed BC Tablets or powders Encaprin Orgaran Warfarin Sodium  Buff-a-Comp Enoxaparin Orudis Zorpin  Buff-a-Comp with Codeine Equegesic Os-Cal-Gesic   Buffaprin Excedrin plain, buffered or Extra Strength Oxalid   Bufferin Arthritis Strength Feldene Oxphenbutazone   Bufferin plain or Extra Strength Feldene Capsules Oxycodone with Aspirin   Bufferin with Codeine Fenoprofen Fenoprofen Pabalate or Pabalate-SF   Buffets II Flogesic Panagesic   Buffinol plain or Extra Strength Florinal or Florinal with Codeine Panwarfarin   Buf-Tabs Flurbiprofen Penicillamine   Butalbital Compound Four-way cold tablets Penicillin   Butazolidin Fragmin Pepto-Bismol   Carbenicillin Geminisyn Percodan   Carna Arthritis Reliever Geopen Persantine   Carprofen Gold's salt Persistin   Chloramphenicol Goody's Phenylbutazone   Chloromycetin Haltrain Piroxlcam   Clmetidine heparin Plaquenil   Cllnoril Hyco-pap Ponstel   Clofibrate Hydroxy chloroquine Propoxyphen         Before stopping any of these medications, be sure to consult the physician who ordered them.  Some, such as Coumadin (Warfarin) are ordered to prevent or treat serious conditions such as "deep thrombosis", "pumonary embolisms", and other heart problems.  The amount of time that you may need off of the medication may also vary with the medication and the reason for which you were taking it.  If you are taking any of these medications, please make sure you notify your pain physician before you undergo any  procedures.           Lumbar Sympathetic Block Patient Information  Description: The lumbar plexus is a group of nerves that are part of the sympathetic nervous system.  These nerves supply organs in the pelvis and legs.  Lumbar sympathetic blocks are utilized for the diagnosis and treatment of painful conditions in these areas.   The lumbar plexus is located on both sides of the aorta at approximately the level of the second lumbar vertebral body.  The block will be performed with you lying on your abdomen with a pillow underneath.  Using direct x-ray guidance,   The plexus will be located on both sides of the spine.  Numbing medicine will be used to deaden the skin prior to needle insertion.  In most cases, a small amount of sedation can be give by IV prior to the numbing medicine.  One or two small needles will be placed near the plexus and local anesthetic will be injected.  This may make your leg(s) feel warm.  The Entire block usually lasts about 15-25 minutes.  Conditions which may be treated by lumbar sympathetic block:   Reflex sympathetic dystrophy  Phantom limb pain  Peripheral neuropathy  Peripheral vascular disease ( inadequate blood flow )  Cancer pain of pelvis, leg and kidney  Preparation for the injection:   Do note eat any solid food or diary products within 6 hours of your appointment.  You may drink clear liquids up to 2 hours before appointment.  Clear liquids include water, black coffee, juice or soda.  No milk or cream please.  You may take your regular medication, including pain medications, with a sip of water before you appointment.  Diabetics should hold regular insulin ( if taken separately ) and take 1/2 NPH dose the morning of the procedure .  Carry some sugar containing items with you to your appointment.  A driver must accompany you and be prepared to drive you home after your procedure.  Bring all your current medication with you.  An IV may  be inserted and sedation may be given at the discretion of the physician.   A blood pressure cuff, EKG and other monitors will often be applied during the procedure.  Some patients may need to have extra oxygen administered for a short period.  You will be asked to provide medical information, including your allergies and medications, prior to the procedure.  We must know immediately if your taking blood thinners (like Coumadin/Warfarin) or if you are allergic to IV iodine contrast (dye).  We must know if you could possibly be pregnant.  Possible side-effects   Bleeding from needle site or deeper  Infection (rare, can require surgery)  Nerve injury (rare)  Numbness & tingling (temporary)  Collapsed lung (rare)  Spinal headache (a headache worse with upright posture)  Light-headedness (temporary)  Pain at injection site (several days)  Decreased blood pressure (temporary)  Weakness in legs (temporary)  Seizure or other drug reaction (rare)  Call if you experience:   Fever/chills associated with headache or increased back/ neck pain  Headache worsened by an upright position  New onset weakness or numbness of an extremity below the injection site  Hives or difficulty breathing ( go to the emergency room)  Inflammation or drainage at the injections site(s)  New symptoms which are concerning to you  Please note:  If effective, we will often do a series of 2-3 injections spaced 3-6 weeks apart to maximally decrease your pain.  If initial series is effective, you may be a candidate for a more permanent block of the lumbar sympathetic plexus.  If you have any questions please call [phone] [facility]   Acetaminophen; Oxycodone capsules What is this medicine? ACETAMINOPHEN; OXYCODONE (a set a MEE noe fen; ox i KOE done) is a pain reliever. It is used to treat moderate to severe pain. This medicine may be used for other purposes;  ask your health care provider or pharmacist if you have questions. What should I tell my health care provider before I take this medicine? They need to know if you have any of these conditions: -brain tumor -Crohn's disease, inflammatory bowel disease, or ulcerative colitis -drug abuse or addiction -head injury -heart  or circulation problems -if you often drink alcohol -kidney disease or problems going to the bathroom -liver disease -lung disease, asthma, or breathing problems -an unusual or allergic reaction to salicylates, acetaminophen, oxycodone, other opioid analgesics, other medicines, foods, dyes, or preservatives -pregnant or trying to get pregnant -breast-feeding How should I use this medicine? Take this medicine by mouth with a full glass of water. Follow the directions on the prescription label. If the medicine upsets your stomach, take it with food or milk. Take your medicine at regular intervals. Do not take your medicine more often than directed. Talk to your pediatrician regarding the use of this medicine in children. Special care may be needed. Patients over 71 years old may have a stronger reaction and need a smaller dose. Overdosage: If you think you have taken too much of this medicine contact a poison control center or emergency room at once. NOTE: This medicine is only for you. Do not share this medicine with others. What if I miss a dose? If you miss a dose, take it as soon as you can. If it is almost time for your next dose, take only that dose. Do not take double or extra doses. What may interact with this medicine? -alcohol -antihistamines -barbiturates like amobarbital, butalbital, butabarbital, methohexital, pentobarbital, phenobarbital, thiopental, and secobarbital -benztropine -drugs for bladder problems like solifenacin, trospium, oxybutynin, tolterodine, hyoscyamine, and methscopolamine -drugs for breathing problems like ipratropium and tiotropium -drugs  for certain stomach or intestine problems like propantheline, homatropine methylbromide, glycopyrrolate, atropine, belladonna, and dicyclomine -general anesthetics like etomidate, ketamine, nitrous oxide, propofol, desflurane, enflurane, halothane, isoflurane, and sevoflurane -medicines for depression, anxiety, or psychotic disturbances -medicines for sleep -muscle relaxants -naltrexone -narcotic medicines (opiates) for pain -phenothiazines like perphenazine, thioridazine, chlorpromazine, mesoridazine, fluphenazine, prochlorperazine, promazine, and trifluoperazine -scopolamine -tramadol -trihexyphenidyl This list may not describe all possible interactions. Give your health care provider a list of all the medicines, herbs, non-prescription drugs, or dietary supplements you use. Also tell them if you smoke, drink alcohol, or use illegal drugs. Some items may interact with your medicine. What should I watch for while using this medicine? Tell your doctor or health care professional if your pain does not go away, if it gets worse, or if you have new or a different type of pain. You may develop tolerance to the medicine. Tolerance means that you will need a higher dose of the medication for pain relief. Tolerance is normal and is expected if you take this medicine for a long time. Do not suddenly stop taking your medicine because you may develop a severe reaction. Your body becomes used to the medicine. This does NOT mean you are addicted. Addiction is a behavior related to getting and using a drug for a nonmedical reason. If you have pain, you have a medical reason to take pain medicine. Your doctor will tell you how much medicine to take. If your doctor wants you to stop the medicine, the dose will be slowly lowered over time to avoid any side effects. You may get drowsy or dizzy. Do not drive, use machinery, or do anything that needs mental alertness until you know how this medicine affects you. Do not  stand or sit up quickly, especially if you are an older patient. This reduces the risk of dizzy or fainting spells. Alcohol may interfere with the effect of this medicine. Avoid alcoholic drinks. There are different types of narcotic medicines (opiates) for pain. If you take more than one type at the same  time, you may have more side effects. Give your health care provider a list of all medicines you use. Your doctor will tell you how much medicine to take. Do not take more medicine than directed. Call emergency for help if you have problems breathing. The medicine will cause constipation. Try to have a bowel movement at least every 2 to 3 days. If you do not have a bowel movement for 3 days, call your doctor or health care professional. Do not take Tylenol (acetaminophen) or medicines that have acetaminophen with this medicine. Too much acetaminophen can be very dangerous. Many non-prescription medicines contain acetaminophen. Always read the labels carefully to avoid taking more acetaminophen. What side effects may I notice from receiving this medicine? Side effects that you should report to your doctor or health care professional as soon as possible: -allergic reactions like skin rash, itching or hives, swelling of the face, lips, or tongue -breathing difficulties, wheezing -confusion -light headedness or fainting spells -severe stomach pain -unusually weak or tired -yellowing of the skin or the whites of the eyes Side effects that usually do not require medical attention (report to your doctor or health care professional if they continue or are bothersome): -dizziness -drowsiness -nausea -vomiting This list may not describe all possible side effects. Call your doctor for medical advice about side effects. You may report side effects to FDA at 1-800-FDA-1088. Where should I keep my medicine? Keep out of the reach of children. This medicine can be abused. Keep your medicine in a safe place to  protect it from theft. Do not share this medicine with anyone. Selling or giving away this medicine is dangerous and against the law. This medicine may cause accidental overdose and death if it taken by other adults, children, or pets. Mix any unused medicine with a substance like cat litter or coffee grounds. Then throw the medicine away in a sealed container like a sealed bag or a coffee can with a lid. Do not use the medicine after the expiration date. Store at room temperature between 15 and 30 degrees C (59 and 86 degrees F). Keep container tightly closed. Protect from light. NOTE: This sheet is a summary. It may not cover all possible information. If you have questions about this medicine, talk to your doctor, pharmacist, or health care provider.    2016, [employer]. (2014-03-14 15:03:56)    A prescription for OXYCODONE was given to you today.

## 2015-06-13 ENCOUNTER — Encounter: Payer: Self-pay | Admitting: Anesthesiology

## 2015-06-13 LAB — TOXASSURE SELECT 13 (MW), URINE: PDF: 0

## 2015-06-13 NOTE — Progress Notes (Signed)
Subjective:    Patient ID: Kimberly Mccarthy, female    DOB: Sep 08, 1955, 60 y.o.   MRN: [id number]  HPI  This patient is a ppleasant and delightful 60 year old lady presents with a two-year history of pain in both feet. She indicates that the pain is equal in both feet The pain started following a stroke which  Produced weakness on her left side The weakness has not improved The patient describes the pain in the burning sharp and constant  Pain intensity rating Her subjective pain intensity rating is 90% There is significant pain dramatization and pain exaggeration Pain is relieved by pain medications especially OxyContin Pain is aggravated by all activities  Pain medication Her pain medications include oxycodone which she takes 10 mg every 6 hours She also uses gabapentin for pain  Other medications Other medications include hydrochlorothiazide hydralazine Colace Protonix Pravachol clonidine carvedilol multivitamins levothyroxine  ferrous sulfate and amlodipine  Allergies No known allergies  Past medical history Past medical history is positive for hypertensi and cerebrovascular accident acid reflux  GERD and Tylenol-introduced hepatic toxicity  Past surgical history Her surgical history is positive for bilatera vascular surgery in both legs  Social and economic history Patient smokes about half pack of cigarettes per day and she has been smoking for the past 43 years She does not use alcohol She has tried all the illicit drugs in the past and she last used them about 1 year ago Currently she is receiving Public librarian  Family history She is para 0+0 She is single and she lives with her sister Her mother is deceased at age 50 from complications of diabetes hypertension and a stroke Her father is deceased but she did not know her father at all  She has 3 brothers: one is deceased at age 65 from heart attack and she has 2 other brothers who are bo at age 42 and 33  years She has 2 sisters both alive and well  Age 60 and 45 years   Review of Systems  Constitutional: Negative for fever, chills, diaphoresis, activity change, appetite change, fatigue and unexpected weight change.  HENT: Negative.  Negative for congestion, dental problem, drooling, ear discharge, ear pain, facial swelling, hearing loss, mouth sores, nosebleeds, postnasal drip, rhinorrhea, sinus pressure, sneezing, sore throat, tinnitus, trouble swallowing and voice change.   Eyes: Negative.  Negative for photophobia, pain, discharge, redness, itching and visual disturbance.  Respiratory: Negative.  Negative for apnea, cough, choking, chest tightness, shortness of breath, wheezing and stridor.   Cardiovascular: Negative.  Negative for chest pain and palpitations.  Gastrointestinal: Positive for abdominal pain. Negative for nausea, vomiting, diarrhea, constipation, blood in stool, abdominal distention, anal bleeding and rectal pain.       This patient has GERD, acid reflux and Tylenol-induced hepatic toxicity  Endocrine: Negative for cold intolerance, heat intolerance, polydipsia, polyphagia and polyuria.  Genitourinary: Negative.  Negative for dysuria, urgency, frequency, hematuria, flank pain, decreased urine volume, enuresis, difficulty urinating, genital sores, menstrual problem, pelvic pain and dyspareunia.  Musculoskeletal: Positive for myalgias, joint swelling, arthralgias and gait problem. Negative for back pain, neck pain and neck stiffness.  Skin: Negative.  Negative for color change, pallor, rash and wound.  Allergic/Immunologic: Negative.  Negative for environmental allergies, food allergies and immunocompromised state.  Neurological: Positive for weakness. Negative for dizziness, tremors, seizures, syncope, facial asymmetry, speech difficulty, light-headedness, numbness and headaches.       Patient had a stroke which left her with left-  sided weakness and pain in both of her feet   Hematological: Negative.  Negative for adenopathy. Does not bruise/bleed easily.  Psychiatric/Behavioral: Negative.  Negative for suicidal ideas, hallucinations, behavioral problems, confusion, sleep disturbance, self-injury, dysphoric mood, decreased concentration and agitation. The patient is not nervous/anxious and is not hyperactive.        Objective:   Physical Exam  Constitutional: She is oriented to person, place, and time. She appears well-developed and well-nourished. No distress.  HENT:  Head: Normocephalic and atraumatic.  Right Ear: External ear normal.  Left Ear: External ear normal.  Nose: Nose normal.  Mouth/Throat: Oropharynx is clear and moist. No oropharyngeal exudate.  Eyes: Conjunctivae and EOM are normal. Pupils are equal, round, and reactive to light. Right eye exhibits no discharge. Left eye exhibits no discharge. No scleral icterus.  Neck: Normal range of motion. Neck supple. No JVD present. No tracheal deviation present. No thyromegaly present.  Cardiovascular: Normal rate, regular rhythm, normal heart sounds and intact distal pulses.  Exam reveals no gallop and no friction rub.   No murmur heard. Her blood pressure is 162/74 mmHg Pulse is 64 bpm Equal and regular Heart sounds 1 and 2 were heard in areas There were no audible murmurs Respiration was 18 breaths per minute  SpO2 was 100% Temperature was 97.63F Chest was clinically clear There were no adventitious sounds  Pulmonary/Chest: Effort normal and breath sounds normal. No stridor. No respiratory distress. She has no wheezes. She has no rales. She exhibits no tenderness.  Abdominal: Soft. Bowel sounds are normal. She exhibits no distension and no mass. There is tenderness. There is rebound. There is no guarding.  This patient has a history of GERD, acid reflux and Tylenol-induced hepatic toxicity  Genitourinary:  Genitourinary  examination was deferred  Musculoskeletal: Normal range of motion. She  exhibits tenderness. She exhibits no edema.  There was a large incisional scar with significant keloid formation on the left leg Both legs with tender and they look atrophic edematous and hyperemic Pedal pulses were present in both feet but thready and weak  Lymphadenopathy:    She has no cervical adenopathy.  Neurological: She is alert and oriented to person, place, and time. She has normal reflexes. She displays normal reflexes. No cranial nerve deficit. She exhibits normal muscle tone. Coordination normal.  Skin: Skin is warm and dry. No rash noted. She is not diaphoretic. No erythema. No pallor.  Psychiatric: She has a normal mood and affect. Her behavior is normal. Judgment and thought content normal.  Nursing note and vitals reviewed.         Assessment & Plan:   Assessment 1 Pain in both legs 2 peripheral vascular insufficiency in both lower extremities bilaterally 3  Status post cerebrovascular accident  4 status post hypotension  5 tobacco abuse    Plan of management 1 Immediate counseling on the discontinuation of smoking 2 right followed by a left lumbar sympathe 3 Roxicet 5/325 mg 1 tablet  Twice a day when necessary # 42 tablets 4 we'll begin procedures for the patient early next month   New patient      Level  4    Lilie Vezina M.D.

## 2015-07-02 ENCOUNTER — Encounter: Payer: Self-pay | Admitting: Anesthesiology

## 2015-07-02 ENCOUNTER — Ambulatory Visit: Payer: Medicare Other | Admitting: Anesthesiology

## 2015-07-02 ENCOUNTER — Ambulatory Visit: Payer: Medicare Other | Attending: Anesthesiology | Admitting: Anesthesiology

## 2015-07-02 VITALS — BP 131/66 | HR 62 | Temp 97.9°F | Resp 18 | Ht 67.0 in | Wt 207.0 lb

## 2015-07-02 DIAGNOSIS — I739 Peripheral vascular disease, unspecified: Secondary | ICD-10-CM | POA: Insufficient documentation

## 2015-07-02 DIAGNOSIS — Z8673 Personal history of transient ischemic attack (TIA), and cerebral infarction without residual deficits: Secondary | ICD-10-CM | POA: Insufficient documentation

## 2015-07-02 DIAGNOSIS — M79604 Pain in right leg: Secondary | ICD-10-CM | POA: Diagnosis not present

## 2015-07-02 DIAGNOSIS — M25562 Pain in left knee: Secondary | ICD-10-CM | POA: Insufficient documentation

## 2015-07-02 DIAGNOSIS — M79605 Pain in left leg: Secondary | ICD-10-CM | POA: Diagnosis not present

## 2015-07-02 DIAGNOSIS — M25561 Pain in right knee: Secondary | ICD-10-CM | POA: Insufficient documentation

## 2015-07-02 MED ORDER — BUPIVACAINE HCL (PF) 0.25 % IJ SOLN
INTRAMUSCULAR | Status: AC
  Filled 2015-07-02: qty 30

## 2015-07-02 MED ORDER — FENTANYL CITRATE (PF) 100 MCG/2ML IJ SOLN
50.0000 ug | INTRAMUSCULAR | Status: DC
  Administered 2015-07-02: 100 ug via INTRAVENOUS

## 2015-07-02 MED ORDER — TRIAMCINOLONE ACETONIDE 40 MG/ML IJ SUSP
INTRAMUSCULAR | Status: AC
Start: 2015-07-02 — End: 2015-07-02
  Filled 2015-07-02: qty 1

## 2015-07-02 MED ORDER — TRIAMCINOLONE ACETONIDE 40 MG/ML IJ SUSP
80.0000 mg | Freq: Once | INTRAMUSCULAR | Status: AC
  Administered 2015-07-02 (×2): via INTRAMUSCULAR

## 2015-07-02 MED ORDER — TRIAMCINOLONE ACETONIDE 40 MG/ML IJ SUSP
INTRAMUSCULAR | Status: AC
  Filled 2015-07-02: qty 1

## 2015-07-02 MED ORDER — BUPIVACAINE HCL (PF) 0.25 % IJ SOLN
20.0000 mL | Freq: Once | INTRAMUSCULAR | Status: AC
  Administered 2015-07-02: 12:00:00

## 2015-07-02 MED ORDER — MIDAZOLAM HCL 5 MG/5ML IJ SOLN
INTRAMUSCULAR | Status: AC
  Administered 2015-07-02: 2 mg via INTRAVENOUS
  Filled 2015-07-02: qty 5

## 2015-07-02 MED ORDER — IOHEXOL 180 MG/ML  SOLN
20.0000 mL | INTRAMUSCULAR | Status: AC | PRN
  Administered 2015-07-02: 12:00:00 via INTRAVENOUS

## 2015-07-02 MED ORDER — IOHEXOL 180 MG/ML  SOLN
INTRAMUSCULAR | Status: AC
Start: 2015-07-02 — End: 2015-07-02
  Filled 2015-07-02: qty 20

## 2015-07-02 MED ORDER — FENTANYL CITRATE (PF) 100 MCG/2ML IJ SOLN
INTRAMUSCULAR | Status: AC
  Administered 2015-07-02: 100 ug via INTRAVENOUS
  Filled 2015-07-02: qty 2

## 2015-07-02 MED ORDER — MIDAZOLAM HCL 5 MG/5ML IJ SOLN
1.0000 mg | INTRAMUSCULAR | Status: DC
  Administered 2015-07-02: 2 mg via INTRAVENOUS

## 2015-07-02 MED ORDER — OXYCODONE HCL 5 MG PO TABS: 5.0000 mg | ORAL_TABLET | Freq: Two times a day (BID) | ORAL | Status: DC | PRN

## 2015-07-02 NOTE — Procedures (Signed)
Date of procedure:  07/02/2015  Preoperative Diagnosis:  1 pain in both legs 2 peripheral vascular insufficiency 3 status post CVA  Postoperative Diagnosis:  Same.  Procedure: 1. left lumbar sympathetic block at L2 and L4.  2. Fluoroscopic guidance.  Surgeon: Tod PersiaWinston Kierrah Kilbride, MD  Anesthesia: MAC anesthesia by by the nurse and staff under my direction  Informed consent was obtained and the patient appeared to accept and understand the benefits and risks of this procedure.   Pre procedure comments: None  Description of the Procedure: The patient was taken to the operating room and placed in the prone position.  Intravenous sedation and MAC anesthesia was administered by the nurse and staff under my direction.  After adequate draping, the lumbar spine was viewed fluoroscopically.   The oblique view was activated to the point just when the transverse processes of L2 and L4 were visualized.   The targe area for local infiltration and needle placement was the point at which the inferior border of the transverse process crossed the lateral border of the body of L2 and L4 on the left side.   Those two target areas were infiltrated with 3 cc of 1% Lidocaine used in a 25 gauge needle.  Then two #22 gauge 3 inch needles were inserted using oblique view and tunnel vision, these were directed just towards that point of intersection between   The inferior border of the transverse process and the body of L2 and L4.   Lateral fluoroscopic views were used to assess the depths of needle insertion so that the needle was at the point of the anterolateral aspect of the body of L2 and L4 on the left side.  Aspirations ere negative for blood, CSF and other body fluids.   1 cc of Omnipaque 300 was injected into the needles at L2 an L4.   Adequate distribution of the dye was observed without any intravascular washout configurations.    Then 10cc of 0.25% Bupivacaine and 40 mg of Kenalog were injected  at L2  while 10 cc of 0.25% Bupivacaine and 40 mg of Kenalog were injected at the L4 level.   Post injection fluoroscopy showed adequate washout of the contrast media.  Both needles were removed and adequate hemostasis was established .   The patient tolerated the procedure quite well and vital signs are stable.  There were no adverse effects.  Additional comments: This procedure was done under fluoroscopic guidance The fluoroscopic parameters were: Number of fluoroscopic frames were 3 Fluoroscopic time was 0.7 minutes MG Y was 16.3  The patient was taken to the recovery room in satisfactory condition where the patient was observed and subsequently discharged.   The patient was given Roxicet 5 mg to take 1-2 tablets when necessary for severe pain and she was given 45 tablets Will follow up in the office in 1 month.   Tod PersiaWinston Leaner Morici M.D.

## 2015-07-02 NOTE — Progress Notes (Signed)
Safety precautions to be maintained throughout the outpatient stay will include: orient to surroundings, keep bed in low position, maintain call bell within reach at all times, provide assistance with transfer out of bed and ambulation.  

## 2015-07-02 NOTE — Patient Instructions (Signed)
A prescription for OXYCODONE was given to you today.  Pain Management Discharge Instructions  General Discharge Instructions :  If you need to reach your doctor call: Monday-Friday 8:00 am - 4:00 pm at [phone] or toll free [phone].  After clinic hours [phone] to have operator reach doctor.  Bring all of your medication bottles to all your appointments in the pain clinic.  To cancel or reschedule your appointment with Pain Management please remember to call 24 hours in advance to avoid a fee.  Refer to the educational materials which you have been given on: General Risks, I had my Procedure. Discharge Instructions, Post Sedation.  Post Procedure Instructions:  The drugs you were given will stay in your system until tomorrow, so for the next 24 hours you should not drive, make any legal decisions or drink any alcoholic beverages.  You may eat anything you prefer, but it is better to start with liquids then soups and crackers, and gradually work up to solid foods.  Please notify your doctor immediately if you have any unusual bleeding, trouble breathing or pain that is not related to your normal pain.  Depending on the type of procedure that was done, some parts of your body may feel week and/or numb.  This usually clears up by tonight or the next day.  Walk with the use of an assistive device or accompanied by an adult for the 24 hours.  You may use ice on the affected area for the first 24 hours.  Put ice in a Ziploc bag and cover with a towel and place against area 15 minutes on 15 minutes off.  You may switch to heat after 24 hours.  Lumbar Sympathetic Block Patient Information  Description: The lumbar plexus is a group of nerves that are part of the sympathetic nervous system.  These nerves supply organs in the pelvis and legs.  Lumbar sympathetic blocks are utilized for the diagnosis and treatment of painful conditions in these areas.   The lumbar plexus is located  on both sides of the aorta at approximately the level of the second lumbar vertebral body.  The block will be performed with you lying on your abdomen with a pillow underneath.  Using direct x-ray guidance,   The plexus will be located on both sides of the spine.  Numbing medicine will be used to deaden the skin prior to needle insertion.  In most cases, a small amount of sedation can be give by IV prior to the numbing medicine.  One or two small needles will be placed near the plexus and local anesthetic will be injected.  This may make your leg(s) feel warm.  The Entire block usually lasts about 15-25 minutes.  Conditions which may be treated by lumbar sympathetic block:   Reflex sympathetic dystrophy  Phantom limb pain  Peripheral neuropathy  Peripheral vascular disease ( inadequate blood flow )  Cancer pain of pelvis, leg and kidney  Preparation for the injection:  1. Do note eat any solid food or diary products within 8 hours of your appointment. 2. You may drink clear liquids up to 3 hours before appointment.  Clear liquids include water, black coffee, juice or soda.  No milk or cream please. 3. You may take your regular medication, including pain medications, with a sip of water before you appointment.  Diabetics should hold regular insulin ( if taken separately ) and take 1/2 NPH dose the morning of the procedure .  Carry some sugar containing  items with you to your appointment. 4. A driver must accompany you and be prepared to drive you home after your procedure. 5. Bring all your current medication with you. 6. An IV may be inserted and sedation may be given at the discretion of the physician.  7. A blood pressure cuff, EKG and other monitors will often be applied during the procedure.  Some patients may need to have extra oxygen administered for a short period. 8. You will be asked to provide medical information, including your allergies and medications, prior to the procedure.  We  must know immediately if your taking blood thinners (like Coumadin/Warfarin) or if you are allergic to IV iodine contrast (dye).  We must know if you could possibly be pregnant.  Possible side-effects   Bleeding from needle site or deeper  Infection (rare, can require surgery)  Nerve injury (rare)  Numbness & tingling (temporary)  Collapsed lung (rare)  Spinal headache (a headache worse with upright posture)  Light-headedness (temporary)  Pain at injection site (several days)  Decreased blood pressure (temporary)  Weakness in legs (temporary)  Seizure or other drug reaction (rare)  Call if you experience:   Fever/chills associated with headache or increased back/ neck pain  Headache worsened by an upright position  New onset weakness or numbness of an extremity below the injection site  Hives or difficulty breathing ( go to the emergency room)  Inflammation or drainage at the injections site(s)  New symptoms which are concerning to you  Please note:  If effective, we will often do a series of 2-3 injections spaced 3-6 weeks apart to maximally decrease your pain.  If initial series is effective, you may be a candidate for a more permanent block of the lumbar sympathetic plexus.  If you have any questions please call [phone] [facility]

## 2015-07-03 ENCOUNTER — Telehealth: Payer: Self-pay | Admitting: *Deleted

## 2015-07-03 NOTE — Telephone Encounter (Signed)
No problems post procedure. 

## 2015-07-16 ENCOUNTER — Ambulatory Visit: Payer: Medicaid Other | Admitting: Gastroenterology

## 2015-08-01 ENCOUNTER — Encounter: Payer: Self-pay | Admitting: Anesthesiology

## 2015-08-01 ENCOUNTER — Ambulatory Visit: Payer: Medicare Other | Attending: Anesthesiology | Admitting: Anesthesiology

## 2015-08-01 VITALS — BP 158/81 | HR 66 | Temp 96.5°F | Resp 14

## 2015-08-01 DIAGNOSIS — M25562 Pain in left knee: Secondary | ICD-10-CM

## 2015-08-01 DIAGNOSIS — Z8673 Personal history of transient ischemic attack (TIA), and cerebral infarction without residual deficits: Secondary | ICD-10-CM | POA: Diagnosis not present

## 2015-08-01 DIAGNOSIS — I739 Peripheral vascular disease, unspecified: Secondary | ICD-10-CM | POA: Diagnosis not present

## 2015-08-01 DIAGNOSIS — M25561 Pain in right knee: Secondary | ICD-10-CM

## 2015-08-01 DIAGNOSIS — M79605 Pain in left leg: Secondary | ICD-10-CM | POA: Insufficient documentation

## 2015-08-01 DIAGNOSIS — G8929 Other chronic pain: Secondary | ICD-10-CM | POA: Diagnosis not present

## 2015-08-01 DIAGNOSIS — I639 Cerebral infarction, unspecified: Secondary | ICD-10-CM

## 2015-08-01 MED ORDER — MIDAZOLAM HCL 5 MG/5ML IJ SOLN: 1.0000 mg | INTRAMUSCULAR | Status: DC

## 2015-08-01 MED ORDER — MIDAZOLAM HCL 5 MG/5ML IJ SOLN
INTRAMUSCULAR | Status: AC
Start: 2015-08-01 — End: 2015-08-01
  Filled 2015-08-01: qty 5

## 2015-08-01 MED ORDER — TRIAMCINOLONE ACETONIDE 40 MG/ML IJ SUSP: 80.0000 mg | Freq: Once | INTRAMUSCULAR | Status: DC

## 2015-08-01 MED ORDER — FENTANYL CITRATE (PF) 100 MCG/2ML IJ SOLN
INTRAMUSCULAR | Status: AC
  Filled 2015-08-01: qty 2

## 2015-08-01 MED ORDER — BUPIVACAINE HCL (PF) 0.25 % IJ SOLN
INTRAMUSCULAR | Status: AC
  Administered 2015-08-01: 11:00:00
  Filled 2015-08-01: qty 30

## 2015-08-01 MED ORDER — TRIAMCINOLONE ACETONIDE 40 MG/ML IJ SUSP
INTRAMUSCULAR | Status: AC
  Administered 2015-08-01: 11:00:00
  Filled 2015-08-01: qty 1

## 2015-08-01 MED ORDER — OXYCODONE-ACETAMINOPHEN 5-325 MG PO TABS: 1.0000 | ORAL_TABLET | Freq: Two times a day (BID) | ORAL | Status: DC | PRN

## 2015-08-01 MED ORDER — IOPAMIDOL (ISOVUE-M 200) INJECTION 41%: 20.0000 mL | INTRAMUSCULAR | Status: DC | PRN

## 2015-08-01 MED ORDER — BUPIVACAINE HCL (PF) 0.25 % IJ SOLN: 20.0000 mL | Freq: Once | INTRAMUSCULAR | Status: DC

## 2015-08-01 MED ORDER — IOPAMIDOL (ISOVUE-M 200) INJECTION 41%
INTRAMUSCULAR | Status: AC
  Administered 2015-08-01: 11:00:00
  Filled 2015-08-01: qty 10

## 2015-08-01 NOTE — Progress Notes (Signed)
Safety precautions to be maintained throughout the outpatient stay will include: orient to surroundings, keep bed in low position, maintain call bell within reach at all times, provide assistance with transfer out of bed and ambulation.  

## 2015-08-01 NOTE — Procedures (Signed)
Date of procedure:  08/01/2015  Preoperative Diagnosis:  1 chronic left leg pain 2 peripheral vascular insufficiency 3 status post CVA  Postoperative Diagnosis:  Same.  Procedure: 1. left lumbar sympathetic block at L2 and L4.  2. Fluoroscopic guidance.  Surgeon: Tod PersiaWinston Dantavious Snowball, MD  Anesthesia: MAC anesthesia by the nurse and staff under my direction  Informed consent was obtained and the patient appeared to accept and understand the benefits and risks of this procedure.   Pre procedure comments:  None  Description of the Procedure:  The patient was taken to the operating room and placed in the prone position.  Intravenous sedation and MAC anesthesia was administered by the nurse and staff under my direction.  After adequate draping, the lumbar spine was viewed fluoroscopically.   His procedure was done using fluoroscopic control The oblique view was activated to the point just when the transverse processes of L2 and L4 were visualized.   The targe area for local infiltration and needle placement was the point at which the inferior border of the transverse process crossed the lateral border of the body of L2 and L4 on the left side.   Those two target areas were infiltrated with 3 cc of 1% Lidocaine used in a 25 gauge needle.  Then two #22 gauge 3 inch needles were inserted using oblique view and tunnel vision, these were directed just towards that point of intersection between   The inferior border of the transverse process and the body of L2 and L4.   Lateral fluoroscopic views were used to assess the depths of needle insertion so that the needle was at the point of the anterolateral aspect of the body of L2 and L4 on the left side.  Aspirations ere negative for blood, CSF and other body fluids.  1 cc of Isovue 200 was injected into the needles at L2 an L4.   Adequate distribution of the dye was observed without any intravascular washout configurations.    Then 10cc of  0.25% Bupivacaine and 40 mg of Kenalog were injected at L2 while 10 cc of 0.25% Bupivacaine and 40 mg of Kenalog were injected at the L4 level.   Post injection fluoroscopy showed adequate washout of the contrast media.  Both needles were removed and adequate hemostasis was established .   The patient tolerated the procedure quite well and vital signs are stable.   There were no adverse effects.  Additional comments: This procedure was done using fluoroscopic guidance Fluoroscopic time was 0.8 minutes Number fluoroscopic frames were 4 MG Y was 21.2  The patient was taken to the recovery room in satisfactory condition where the patient was observed and subsequently discharged.  Patient was given Roxicet 5/325 mg to take 1 tablet twice a day on a when necessary basis and she was given 45 tablets  Will follow up in the office in 1 month   Tod PersiaWinston Marisal Swarey M.D.

## 2015-08-01 NOTE — Patient Instructions (Signed)
Pain Management Discharge Instructions  General Discharge Instructions :  If you need to reach your doctor call: Monday-Friday 8:00 am - 4:00 pm at [phone] or toll free [phone].  After clinic hours [phone] to have operator reach doctor.  Bring all of your medication bottles to all your appointments in the pain clinic.  To cancel or reschedule your appointment with Pain Management please remember to call 24 hours in advance to avoid a fee.  Refer to the educational materials which you have been given on: General Risks, I had my Procedure. Discharge Instructions, Post Sedation.  Post Procedure Instructions:  The drugs you were given will stay in your system until tomorrow, so for the next 24 hours you should not drive, make any legal decisions or drink any alcoholic beverages.  You may eat anything you prefer, but it is better to start with liquids then soups and crackers, and gradually work up to solid foods.  Please notify your doctor immediately if you have any unusual bleeding, trouble breathing or pain that is not related to your normal pain.  Depending on the type of procedure that was done, some parts of your body may feel week and/or numb.  This usually clears up by tonight or the next day.  Walk with the use of an assistive device or accompanied by an adult for the 24 hours.  You may use ice on the affected area for the first 24 hours.  Put ice in a Ziploc bag and cover with a towel and place against area 15 minutes on 15 minutes off.  You may switch to heat after 24 hours.Lumbar Sympathetic Block Patient Information  Description: The lumbar plexus is a group of nerves that are part of the sympathetic nervous system.  These nerves supply organs in the pelvis and legs.  Lumbar sympathetic blocks are utilized for the diagnosis and treatment of painful conditions in these areas.   The lumbar plexus is located on both sides of the aorta at approximately the level of  the second lumbar vertebral body.  The block will be performed with you lying on your abdomen with a pillow underneath.  Using direct x-ray guidance,   The plexus will be located on both sides of the spine.  Numbing medicine will be used to deaden the skin prior to needle insertion.  In most cases, a small amount of sedation can be give by IV prior to the numbing medicine.  One or two small needles will be placed near the plexus and local anesthetic will be injected.  This may make your leg(s) feel warm.  The Entire block usually lasts about 15-25 minutes.  Conditions which may be treated by lumbar sympathetic block:   Reflex sympathetic dystrophy  Phantom limb pain  Peripheral neuropathy  Peripheral vascular disease ( inadequate blood flow )  Cancer pain of pelvis, leg and kidney  Preparation for the injection:  1. Do note eat any solid food or diary products within 8 hours of your appointment. 2. You may drink clear liquids up to 3 hours before appointment.  Clear liquids include water, black coffee, juice or soda.  No milk or cream please. 3. You may take your regular medication, including pain medications, with a sip of water before you appointment.  Diabetics should hold regular insulin ( if taken separately ) and take 1/2 NPH dose the morning of the procedure .  Carry some sugar containing items with you to your appointment. 4. A driver must accompany you  and be prepared to drive you home after your procedure. 5. Bring all your current medication with you. 6. An IV may be inserted and sedation may be given at the discretion of the physician.  7. A blood pressure cuff, EKG and other monitors will often be applied during the procedure.  Some patients may need to have extra oxygen administered for a short period. 8. You will be asked to provide medical information, including your allergies and medications, prior to the procedure.  We must know immediately if your taking blood thinners  (like Coumadin/Warfarin) or if you are allergic to IV iodine contrast (dye).  We must know if you could possibly be pregnant.  Possible side-effects   Bleeding from needle site or deeper  Infection (rare, can require surgery)  Nerve injury (rare)  Numbness & tingling (temporary)  Collapsed lung (rare)  Spinal headache (a headache worse with upright posture)  Light-headedness (temporary)  Pain at injection site (several days)  Decreased blood pressure (temporary)  Weakness in legs (temporary)  Seizure or other drug reaction (rare)  Call if you experience:   Fever/chills associated with headache or increased back/ neck pain  Headache worsened by an upright position  New onset weakness or numbness of an extremity below the injection site  Hives or difficulty breathing ( go to the emergency room)  Inflammation or drainage at the injections site(s)  New symptoms which are concerning to you  Please note:  If effective, we will often do a series of 2-3 injections spaced 3-6 weeks apart to maximally decrease your pain.  If initial series is effective, you may be a candidate for a more permanent block of the lumbar sympathetic plexus.  If you have any questions please call [phone] [facility] GENERAL RISKS AND COMPLICATIONS  What are the risk, side effects and possible complications? Generally speaking, most procedures are safe.  However, with any procedure there are risks, side effects, and the possibility of complications.  The risks and complications are dependent upon the sites that are lesioned, or the type of nerve block to be performed.  The closer the procedure is to the spine, the more serious the risks are.  Great care is taken when placing the radio frequency needles, block needles or lesioning probes, but sometimes complications can occur. 1. Infection: Any time there is an injection through the skin, there is a risk of  infection.  This is why sterile conditions are used for these blocks.  There are four possible types of infection. 1. Localized skin infection. 2. Central Nervous System Infection-This can be in the form of Meningitis, which can be deadly. 3. Epidural Infections-This can be in the form of an epidural abscess, which can cause pressure inside of the spine, causing compression of the spinal cord with subsequent paralysis. This would require an emergency surgery to decompress, and there are no guarantees that the patient would recover from the paralysis. 4. Discitis-This is an infection of the intervertebral discs.  It occurs in about 1% of discography procedures.  It is difficult to treat and it may lead to surgery.        2. Pain: the needles have to go through skin and soft tissues, will cause soreness.       3. Damage to internal structures:  The nerves to be lesioned may be near blood vessels or    other nerves which can be potentially damaged.       4. Bleeding: Bleeding  is more common if the patient is taking blood thinners such as  aspirin, Coumadin, Ticiid, Plavix, etc., or if he/she have some genetic predisposition  such as hemophilia. Bleeding into the spinal canal can cause compression of the spinal  cord with subsequent paralysis.  This would require an emergency surgery to  decompress and there are no guarantees that the patient would recover from the  paralysis.       5. Pneumothorax:  Puncturing of a lung is a possibility, every time a needle is introduced in  the area of the chest or upper back.  Pneumothorax refers to free air around the  collapsed lung(s), inside of the thoracic cavity (chest cavity).  Another two possible  complications related to a similar event would include: Hemothorax and Chylothorax.   These are variations of the Pneumothorax, where instead of air around the collapsed  lung(s), you may have blood or chyle, respectively.       6. Spinal headaches: They may occur with  any procedures in the area of the spine.       7. Persistent CSF (Cerebro-Spinal Fluid) leakage: This is a rare problem, but may occur  with prolonged intrathecal or epidural catheters either due to the formation of a fistulous  track or a dural tear.       8. Nerve damage: By working so close to the spinal cord, there is always a possibility of  nerve damage, which could be as serious as a permanent spinal cord injury with  paralysis.       9. Death:  Although rare, severe deadly allergic reactions known as "Anaphylactic  reaction" can occur to any of the medications used.      10. Worsening of the symptoms:  We can always make thing worse.  What are the chances of something like this happening? Chances of any of this occuring are extremely low.  By statistics, you have more of a chance of getting killed in a motor vehicle accident: while driving to the hospital than any of the above occurring .  Nevertheless, you should be aware that they are possibilities.  In general, it is similar to taking a shower.  Everybody knows that you can slip, hit your head and get killed.  Does that mean that you should not shower again?  Nevertheless always keep in mind that statistics do not mean anything if you happen to be on the wrong side of them.  Even if a procedure has a 1 (one) in a 1,000,000 (million) chance of going wrong, it you happen to be that one..Also, keep in mind that by statistics, you have more of a chance of having something go wrong when taking medications.  Who should not have this procedure? If you are on a blood thinning medication (e.g. Coumadin, Plavix, see list of "Blood Thinners"), or if you have an active infection going on, you should not have the procedure.  If you are taking any blood thinners, please inform your physician.  How should I prepare for this procedure?  Do not eat or drink anything at least six hours prior to the procedure.  Bring a driver with you .  It cannot be a  taxi.  Come accompanied by an adult that can drive you back, and that is strong enough to help you if your legs get weak or numb from the local anesthetic.  Take all of your medicines the morning of the procedure with just enough water to swallow them.  If you have diabetes, make sure that you are scheduled to have your procedure done first thing in the morning, whenever possible.  If you have diabetes, take only half of your insulin dose and notify our nurse that you have done so as soon as you arrive at the clinic.  If you are diabetic, but only take blood sugar pills (oral hypoglycemic), then do not take them on the morning of your procedure.  You may take them after you have had the procedure.  Do not take aspirin or any aspirin-containing medications, at least eleven (11) days prior to the procedure.  They may prolong bleeding.  Wear loose fitting clothing that may be easy to take off and that you would not mind if it got stained with Betadine or blood.  Do not wear any jewelry or perfume  Remove any nail coloring.  It will interfere with some of our monitoring equipment.  NOTE: Remember that this is not meant to be interpreted as a complete list of all possible complications.  Unforeseen problems may occur.  BLOOD THINNERS The following drugs contain aspirin or other products, which can cause increased bleeding during surgery and should not be taken for 2 weeks prior to and 1 week after surgery.  If you should need take something for relief of minor pain, you may take acetaminophen which is found in Tylenol,m Datril, Anacin-3 and Panadol. It is not blood thinner. The products listed below are.  Do not take any of the products listed below in addition to any listed on your instruction sheet.  A.P.C or A.P.C with Codeine Codeine Phosphate Capsules #3 Ibuprofen Ridaura  ABC compound Congesprin Imuran rimadil  Advil Cope Indocin Robaxisal  Alka-Seltzer Effervescent Pain Reliever and  Antacid Coricidin or Coricidin-D  Indomethacin Rufen  Alka-Seltzer plus Cold Medicine Cosprin Ketoprofen S-A-C Tablets  Anacin Analgesic Tablets or Capsules Coumadin Korlgesic Salflex  Anacin Extra Strength Analgesic tablets or capsules CP-2 Tablets Lanoril Salicylate  Anaprox Cuprimine Capsules Levenox Salocol  Anexsia-D Dalteparin Magan Salsalate  Anodynos Darvon compound Magnesium Salicylate Sine-off  Ansaid Dasin Capsules Magsal Sodium Salicylate  Anturane Depen Capsules Marnal Soma  APF Arthritis pain formula Dewitt's Pills Measurin Stanback  Argesic Dia-Gesic Meclofenamic Sulfinpyrazone  Arthritis Bayer Timed Release Aspirin Diclofenac Meclomen Sulindac  Arthritis pain formula Anacin Dicumarol Medipren Supac  Analgesic (Safety coated) Arthralgen Diffunasal Mefanamic Suprofen  Arthritis Strength Bufferin Dihydrocodeine Mepro Compound Suprol  Arthropan liquid Dopirydamole Methcarbomol with Aspirin Synalgos  ASA tablets/Enseals Disalcid Micrainin Tagament  Ascriptin Doan's Midol Talwin  Ascriptin A/D Dolene Mobidin Tanderil  Ascriptin Extra Strength Dolobid Moblgesic Ticlid  Ascriptin with Codeine Doloprin or Doloprin with Codeine Momentum Tolectin  Asperbuf Duoprin Mono-gesic Trendar  Aspergum Duradyne Motrin or Motrin IB Triminicin  Aspirin plain, buffered or enteric coated Durasal Myochrisine Trigesic  Aspirin Suppositories Easprin Nalfon Trillsate  Aspirin with Codeine Ecotrin Regular or Extra Strength Naprosyn Uracel  Atromid-S Efficin Naproxen Ursinus  Auranofin Capsules Elmiron Neocylate Vanquish  Axotal Emagrin Norgesic Verin  Azathioprine Empirin or Empirin with Codeine Normiflo Vitamin E  Azolid Emprazil Nuprin Voltaren  Bayer Aspirin plain, buffered or children's or timed BC Tablets or powders Encaprin Orgaran Warfarin Sodium  Buff-a-Comp Enoxaparin Orudis Zorpin  Buff-a-Comp with Codeine Equegesic Os-Cal-Gesic   Buffaprin Excedrin plain, buffered or Extra Strength  Oxalid   Bufferin Arthritis Strength Feldene Oxphenbutazone   Bufferin plain or Extra Strength Feldene Capsules Oxycodone with Aspirin   Bufferin with Codeine Fenoprofen Fenoprofen Pabalate or Pabalate-SF   Buffets II  Flogesic Panagesic   Buffinol plain or Extra Strength Florinal or Florinal with Codeine Panwarfarin   Buf-Tabs Flurbiprofen Penicillamine   Butalbital Compound Four-way cold tablets Penicillin   Butazolidin Fragmin Pepto-Bismol   Carbenicillin Geminisyn Percodan   Carna Arthritis Reliever Geopen Persantine   Carprofen Gold's salt Persistin   Chloramphenicol Goody's Phenylbutazone   Chloromycetin Haltrain Piroxlcam   Clmetidine heparin Plaquenil   Cllnoril Hyco-pap Ponstel   Clofibrate Hydroxy chloroquine Propoxyphen         Before stopping any of these medications, be sure to consult the physician who ordered them.  Some, such as Coumadin (Warfarin) are ordered to prevent or treat serious conditions such as "deep thrombosis", "pumonary embolisms", and other heart problems.  The amount of time that you may need off of the medication may also vary with the medication and the reason for which you were taking it.  If you are taking any of these medications, please make sure you notify your pain physician before you undergo any procedures.

## 2015-08-02 ENCOUNTER — Telehealth: Payer: Self-pay

## 2015-08-02 NOTE — Telephone Encounter (Signed)
Post procedure phone call. Patient states she is doing good.  

## 2015-08-15 ENCOUNTER — Ambulatory Visit: Payer: Medicaid Other | Admitting: Gastroenterology

## 2015-08-30 ENCOUNTER — Encounter: Payer: Self-pay | Admitting: Anesthesiology

## 2015-08-30 ENCOUNTER — Ambulatory Visit: Payer: Medicare Other | Attending: Anesthesiology | Admitting: Anesthesiology

## 2015-08-30 ENCOUNTER — Ambulatory Visit: Payer: Medicare Other | Admitting: Anesthesiology

## 2015-08-30 VITALS — BP 151/82 | HR 63 | Temp 98.1°F | Resp 14 | Ht 67.0 in | Wt 229.0 lb

## 2015-08-30 DIAGNOSIS — G90522 Complex regional pain syndrome I of left lower limb: Secondary | ICD-10-CM | POA: Diagnosis not present

## 2015-08-30 DIAGNOSIS — I739 Peripheral vascular disease, unspecified: Secondary | ICD-10-CM | POA: Diagnosis not present

## 2015-08-30 DIAGNOSIS — M79662 Pain in left lower leg: Secondary | ICD-10-CM | POA: Insufficient documentation

## 2015-08-30 DIAGNOSIS — M79661 Pain in right lower leg: Secondary | ICD-10-CM | POA: Diagnosis present

## 2015-08-30 DIAGNOSIS — G90529 Complex regional pain syndrome I of unspecified lower limb: Secondary | ICD-10-CM | POA: Insufficient documentation

## 2015-08-30 MED ORDER — IOPAMIDOL (ISOVUE-M 200) INJECTION 41%: 20.0000 mL | INTRAMUSCULAR | Status: DC | PRN

## 2015-08-30 MED ORDER — MIDAZOLAM HCL 5 MG/5ML IJ SOLN
INTRAMUSCULAR | Status: AC
  Administered 2015-08-30: 2 mg via INTRAVENOUS
  Filled 2015-08-30: qty 5

## 2015-08-30 MED ORDER — OXYCODONE-ACETAMINOPHEN 5-325 MG PO TABS: 1.0000 | ORAL_TABLET | Freq: Two times a day (BID) | ORAL | Status: DC | PRN

## 2015-08-30 MED ORDER — MIDAZOLAM HCL 5 MG/5ML IJ SOLN: 1.0000 mg | INTRAMUSCULAR | Status: DC

## 2015-08-30 MED ORDER — TRIAMCINOLONE ACETONIDE 40 MG/ML IJ SUSP
INTRAMUSCULAR | Status: AC
  Administered 2015-08-30: 14:00:00
  Filled 2015-08-30: qty 2

## 2015-08-30 MED ORDER — BUPIVACAINE HCL (PF) 0.25 % IJ SOLN: 20.0000 mL | Freq: Once | INTRAMUSCULAR | Status: DC

## 2015-08-30 MED ORDER — FENTANYL CITRATE (PF) 100 MCG/2ML IJ SOLN: 50.0000 ug | INTRAMUSCULAR | Status: DC

## 2015-08-30 MED ORDER — BUPIVACAINE HCL (PF) 0.25 % IJ SOLN
INTRAMUSCULAR | Status: AC
  Administered 2015-08-30: 14:00:00
  Filled 2015-08-30: qty 30

## 2015-08-30 MED ORDER — TRIAMCINOLONE ACETONIDE 40 MG/ML IJ SUSP: 80.0000 mg | Freq: Once | INTRAMUSCULAR | Status: DC

## 2015-08-30 MED ORDER — IOPAMIDOL (ISOVUE-M 200) INJECTION 41%
INTRAMUSCULAR | Status: AC
  Administered 2015-08-30: 14:00:00
  Filled 2015-08-30: qty 10

## 2015-08-30 MED ORDER — FENTANYL CITRATE (PF) 100 MCG/2ML IJ SOLN
INTRAMUSCULAR | Status: AC
  Administered 2015-08-30: 50 ug via INTRAVENOUS
  Filled 2015-08-30: qty 2

## 2015-08-30 NOTE — Patient Instructions (Addendum)
GENERAL RISKS AND COMPLICATIONS  What are the risk, side effects and possible complications? Generally speaking, most procedures are safe.  However, with any procedure there are risks, side effects, and the possibility of complications.  The risks and complications are dependent upon the sites that are lesioned, or the type of nerve block to be performed.  The closer the procedure is to the spine, the more serious the risks are.  Great care is taken when placing the radio frequency needles, block needles or lesioning probes, but sometimes complications can occur. 1. Infection: Any time there is an injection through the skin, there is a risk of infection.  This is why sterile conditions are used for these blocks.  There are four possible types of infection. 1. Localized skin infection. 2. Central Nervous System Infection-This can be in the form of Meningitis, which can be deadly. 3. Epidural Infections-This can be in the form of an epidural abscess, which can cause pressure inside of the spine, causing compression of the spinal cord with subsequent paralysis. This would require an emergency surgery to decompress, and there are no guarantees that the patient would recover from the paralysis. 4. Discitis-This is an infection of the intervertebral discs.  It occurs in about 1% of discography procedures.  It is difficult to treat and it may lead to surgery.        2. Pain: the needles have to go through skin and soft tissues, will cause soreness.       3. Damage to internal structures:  The nerves to be lesioned may be near blood vessels or    other nerves which can be potentially damaged.       4. Bleeding: Bleeding is more common if the patient is taking blood thinners such as  aspirin, Coumadin, Ticiid, Plavix, etc., or if he/she have some genetic predisposition  such as hemophilia. Bleeding into the spinal canal can cause compression of the spinal  cord with subsequent paralysis.  This would require an  emergency surgery to  decompress and there are no guarantees that the patient would recover from the  paralysis.       5. Pneumothorax:  Puncturing of a lung is a possibility, every time a needle is introduced in  the area of the chest or upper back.  Pneumothorax refers to free air around the  collapsed lung(s), inside of the thoracic cavity (chest cavity).  Another two possible  complications related to a similar event would include: Hemothorax and Chylothorax.   These are variations of the Pneumothorax, where instead of air around the collapsed  lung(s), you may have blood or chyle, respectively.       6. Spinal headaches: They may occur with any procedures in the area of the spine.       7. Persistent CSF (Cerebro-Spinal Fluid) leakage: This is a rare problem, but may occur  with prolonged intrathecal or epidural catheters either due to the formation of a fistulous  track or a dural tear.       8. Nerve damage: By working so close to the spinal cord, there is always a possibility of  nerve damage, which could be as serious as a permanent spinal cord injury with  paralysis.       9. Death:  Although rare, severe deadly allergic reactions known as "Anaphylactic  reaction" can occur to any of the medications used.      10. Worsening of the symptoms:  We can always make thing worse.    What are the chances of something like this happening? Chances of any of this occuring are extremely low.  By statistics, you have more of a chance of getting killed in a motor vehicle accident: while driving to the hospital than any of the above occurring .  Nevertheless, you should be aware that they are possibilities.  In general, it is similar to taking a shower.  Everybody knows that you can slip, hit your head and get killed.  Does that mean that you should not shower again?  Nevertheless always keep in mind that statistics do not mean anything if you happen to be on the wrong side of them.  Even if a procedure has a 1  (one) in a 1,000,000 (million) chance of going wrong, it you happen to be that one..Also, keep in mind that by statistics, you have more of a chance of having something go wrong when taking medications.  Who should not have this procedure? If you are on a blood thinning medication (e.g. Coumadin, Plavix, see list of "Blood Thinners"), or if you have an active infection going on, you should not have the procedure.  If you are taking any blood thinners, please inform your physician.  How should I prepare for this procedure?  Do not eat or drink anything at least six hours prior to the procedure.  Bring a driver with you .  It cannot be a taxi.  Come accompanied by an adult that can drive you back, and that is strong enough to help you if your legs get weak or numb from the local anesthetic.  Take all of your medicines the morning of the procedure with just enough water to swallow them.  If you have diabetes, make sure that you are scheduled to have your procedure done first thing in the morning, whenever possible.  If you have diabetes, take only half of your insulin dose and notify our nurse that you have done so as soon as you arrive at the clinic.  If you are diabetic, but only take blood sugar pills (oral hypoglycemic), then do not take them on the morning of your procedure.  You may take them after you have had the procedure.  Do not take aspirin or any aspirin-containing medications, at least eleven (11) days prior to the procedure.  They may prolong bleeding.  Wear loose fitting clothing that may be easy to take off and that you would not mind if it got stained with Betadine or blood.  Do not wear any jewelry or perfume  Remove any nail coloring.  It will interfere with some of our monitoring equipment.  NOTE: Remember that this is not meant to be interpreted as a complete list of all possible complications.  Unforeseen problems may occur.  BLOOD THINNERS The following drugs  contain aspirin or other products, which can cause increased bleeding during surgery and should not be taken for 2 weeks prior to and 1 week after surgery.  If you should need take something for relief of minor pain, you may take acetaminophen which is found in Tylenol,m Datril, Anacin-3 and Panadol. It is not blood thinner. The products listed below are.  Do not take any of the products listed below in addition to any listed on your instruction sheet.  A.P.C or A.P.C with Codeine Codeine Phosphate Capsules #3 Ibuprofen Ridaura  ABC compound Congesprin Imuran rimadil  Advil Cope Indocin Robaxisal  Alka-Seltzer Effervescent Pain Reliever and Antacid Coricidin or Coricidin-D  Indomethacin Rufen    Alka-Seltzer plus Cold Medicine Cosprin Ketoprofen S-A-C Tablets  Anacin Analgesic Tablets or Capsules Coumadin Korlgesic Salflex  Anacin Extra Strength Analgesic tablets or capsules CP-2 Tablets Lanoril Salicylate  Anaprox Cuprimine Capsules Levenox Salocol  Anexsia-D Dalteparin Magan Salsalate  Anodynos Darvon compound Magnesium Salicylate Sine-off  Ansaid Dasin Capsules Magsal Sodium Salicylate  Anturane Depen Capsules Marnal Soma  APF Arthritis pain formula Dewitt's Pills Measurin Stanback  Argesic Dia-Gesic Meclofenamic Sulfinpyrazone  Arthritis Bayer Timed Release Aspirin Diclofenac Meclomen Sulindac  Arthritis pain formula Anacin Dicumarol Medipren Supac  Analgesic (Safety coated) Arthralgen Diffunasal Mefanamic Suprofen  Arthritis Strength Bufferin Dihydrocodeine Mepro Compound Suprol  Arthropan liquid Dopirydamole Methcarbomol with Aspirin Synalgos  ASA tablets/Enseals Disalcid Micrainin Tagament  Ascriptin Doan's Midol Talwin  Ascriptin A/D Dolene Mobidin Tanderil  Ascriptin Extra Strength Dolobid Moblgesic Ticlid  Ascriptin with Codeine Doloprin or Doloprin with Codeine Momentum Tolectin  Asperbuf Duoprin Mono-gesic Trendar  Aspergum Duradyne Motrin or Motrin IB Triminicin  Aspirin  plain, buffered or enteric coated Durasal Myochrisine Trigesic  Aspirin Suppositories Easprin Nalfon Trillsate  Aspirin with Codeine Ecotrin Regular or Extra Strength Naprosyn Uracel  Atromid-S Efficin Naproxen Ursinus  Auranofin Capsules Elmiron Neocylate Vanquish  Axotal Emagrin Norgesic Verin  Azathioprine Empirin or Empirin with Codeine Normiflo Vitamin E  Azolid Emprazil Nuprin Voltaren  Bayer Aspirin plain, buffered or children's or timed BC Tablets or powders Encaprin Orgaran Warfarin Sodium  Buff-a-Comp Enoxaparin Orudis Zorpin  Buff-a-Comp with Codeine Equegesic Os-Cal-Gesic   Buffaprin Excedrin plain, buffered or Extra Strength Oxalid   Bufferin Arthritis Strength Feldene Oxphenbutazone   Bufferin plain or Extra Strength Feldene Capsules Oxycodone with Aspirin   Bufferin with Codeine Fenoprofen Fenoprofen Pabalate or Pabalate-SF   Buffets II Flogesic Panagesic   Buffinol plain or Extra Strength Florinal or Florinal with Codeine Panwarfarin   Buf-Tabs Flurbiprofen Penicillamine   Butalbital Compound Four-way cold tablets Penicillin   Butazolidin Fragmin Pepto-Bismol   Carbenicillin Geminisyn Percodan   Carna Arthritis Reliever Geopen Persantine   Carprofen Gold's salt Persistin   Chloramphenicol Goody's Phenylbutazone   Chloromycetin Haltrain Piroxlcam   Clmetidine heparin Plaquenil   Cllnoril Hyco-pap Ponstel   Clofibrate Hydroxy chloroquine Propoxyphen         Before stopping any of these medications, be sure to consult the physician who ordered them.  Some, such as Coumadin (Warfarin) are ordered to prevent or treat serious conditions such as "deep thrombosis", "pumonary embolisms", and other heart problems.  The amount of time that you may need off of the medication may also vary with the medication and the reason for which you were taking it.  If you are taking any of these medications, please make sure you notify your pain physician before you undergo any  procedures.         Lumbar Sympathetic Block Patient Information  Description: The lumbar plexus is a group of nerves that are part of the sympathetic nervous system.  These nerves supply organs in the pelvis and legs.  Lumbar sympathetic blocks are utilized for the diagnosis and treatment of painful conditions in these areas.   The lumbar plexus is located on both sides of the aorta at approximately the level of the second lumbar vertebral body.  The block will be performed with you lying on your abdomen with a pillow underneath.  Using direct x-ray guidance,   The plexus will be located on both sides of the spine.  Numbing medicine will be used to deaden the skin prior to needle insertion.  In most   cases, a small amount of sedation can be give by IV prior to the numbing medicine.  One or two small needles will be placed near the plexus and local anesthetic will be injected.  This may make your leg(s) feel warm.  The Entire block usually lasts about 15-25 minutes.  Conditions which may be treated by lumbar sympathetic block:   Reflex sympathetic dystrophy  Phantom limb pain  Peripheral neuropathy  Peripheral vascular disease ( inadequate blood flow )  Cancer pain of pelvis, leg and kidney  Preparation for the injection:  1. Do note eat any solid food or diary products within 8 hours of your appointment. 2. You may drink clear liquids up to 3 hours before appointment.  Clear liquids include water, black coffee, juice or soda.  No milk or cream please. 3. You may take your regular medication, including pain medications, with a sip of water before you appointment.  Diabetics should hold regular insulin ( if taken separately ) and take 1/2 NPH dose the morning of the procedure .  Carry some sugar containing items with you to your appointment. 4. A driver must accompany you and be prepared to drive you home after your procedure. 5. Bring all your current medication with you. 6. An IV  may be inserted and sedation may be given at the discretion of the physician.  7. A blood pressure cuff, EKG and other monitors will often be applied during the procedure.  Some patients may need to have extra oxygen administered for a short period. 8. You will be asked to provide medical information, including your allergies and medications, prior to the procedure.  We must know immediately if your taking blood thinners (like Coumadin/Warfarin) or if you are allergic to IV iodine contrast (dye).  We must know if you could possibly be pregnant.  Possible side-effects   Bleeding from needle site or deeper  Infection (rare, can require surgery)  Nerve injury (rare)  Numbness & tingling (temporary)  Collapsed lung (rare)  Spinal headache (a headache worse with upright posture)  Light-headedness (temporary)  Pain at injection site (several days)  Decreased blood pressure (temporary)  Weakness in legs (temporary)  Seizure or other drug reaction (rare)  Call if you experience:   Fever/chills associated with headache or increased back/ neck pain  Headache worsened by an upright position  New onset weakness or numbness of an extremity below the injection site  Hives or difficulty breathing ( go to the emergency room)  Inflammation or drainage at the injections site(s)  New symptoms which are concerning to you  Please note:  If effective, we will often do a series of 2-3 injections spaced 3-6 weeks apart to maximally decrease your pain.  If initial series is effective, you may be a candidate for a more permanent block of the lumbar sympathetic plexus.  If you have any questions please call [phone] [facility] Pain Management Discharge Instructions  General Discharge Instructions :  If you need to reach your doctor call: Monday-Friday 8:00 am - 4:00 pm at [phone] or toll free [phone].  After clinic hours [phone] to  have operator reach doctor.  Bring all of your medication bottles to all your appointments in the pain clinic.  To cancel or reschedule your appointment with Pain Management please remember to call 24 hours in advance to avoid a fee.  Refer to the educational materials which you have been given on: General Risks, I had my Procedure. Discharge  Instructions, Post Sedation.  Post Procedure Instructions:  The drugs you were given will stay in your system until tomorrow, so for the next 24 hours you should not drive, make any legal decisions or drink any alcoholic beverages.  You may eat anything you prefer, but it is better to start with liquids then soups and crackers, and gradually work up to solid foods.  Please notify your doctor immediately if you have any unusual bleeding, trouble breathing or pain that is not related to your normal pain.  Depending on the type of procedure that was done, some parts of your body may feel week and/or numb.  This usually clears up by tonight or the next day.  Walk with the use of an assistive device or accompanied by an adult for the 24 hours.  You may use ice on the affected area for the first 24 hours.  Put ice in a Ziploc bag and cover with a towel and place against area 15 minutes on 15 minutes off.  You may switch to heat after 24 hours.Epidural Steroid Injection An epidural steroid injection is given to relieve pain in your neck, back, or legs that is caused by the irritation or swelling of a nerve root. This procedure involves injecting a steroid and numbing medicine (anesthetic) into the epidural space. The epidural space is the space between the outer covering of your spinal cord and the bones that form your backbone (vertebra).  LET [facility] CARE PROVIDER KNOW ABOUT:  6. Any allergies you have. 7. All medicines you are taking, including vitamins, herbs, eye drops, creams, and over-the-counter medicines such as aspirin. 8. Previous problems you  or members of your family have had with the use of anesthetics. 9. Any blood disorders or blood clotting disorders you have. 10. Previous surgeries you have had. 11. Medical conditions you have. RISKS AND COMPLICATIONS Generally, this is a safe procedure. However, as with any procedure, complications can occur. Possible complications of epidural steroid injection include: 1. Headache. 2. Bleeding. 3. Infection. 4. Allergic reaction to the medicines. 5. Damage to your nerves. The response to this procedure depends on the underlying cause of the pain and its duration. People who have long-term (chronic) pain are less likely to benefit from epidural steroids than are those people whose pain comes on strong and suddenly. BEFORE THE PROCEDURE  9. Ask your health care provider about changing or stopping your regular medicines. You may be advised to stop taking blood-thinning medicines a few days before the procedure. 10. You may be given medicines to reduce anxiety. 11. Arrange for someone to take you home after the procedure. PROCEDURE   You will remain awake during the procedure. You may receive medicine to make you relaxed.  You will be asked to lie on your stomach.  The injection site will be cleaned.  The injection site will be numbed with a medicine (local anesthetic).  A needle will be injected through your skin into the epidural space.  Your health care provider will use an X-ray machine to ensure that the steroid is delivered closest to the affected nerve. You may have minimal discomfort at this time.  Once the needle is in the right position, the local anesthetic and the steroid will be injected into the epidural space.  The needle will then be removed and a bandage will be applied to the injection site. AFTER THE PROCEDURE  18. You may be monitored for a short time before you go home. 19. You  may feel weakness or numbness in your arm or leg, which disappears within  hours. 20. You may be allowed to eat, drink, and take your regular medicine. 21. You may have soreness at the site of the injection.   This information is not intended to replace advice given to you by your health care provider. Make sure you discuss any questions you have with your health care provider.   Document Released: 07/21/2007 Document Revised: 12/14/2012 Document Reviewed: 09/30/2012 Elsevier Interactive Patient Education 2016 [employer] GENERAL RISKS AND COMPLICATIONS  What are the risk, side effects and possible complications? Generally speaking, most procedures are safe.  However, with any procedure there are risks, side effects, and the possibility of complications.  The risks and complications are dependent upon the sites that are lesioned, or the type of nerve block to be performed.  The closer the procedure is to the spine, the more serious the risks are.  Great care is taken when placing the radio frequency needles, block needles or lesioning probes, but sometimes complications can occur. 1. Infection: Any time there is an injection through the skin, there is a risk of infection.  This is why sterile conditions are used for these blocks.  There are four possible types of infection. 1. Localized skin infection. 2. Central Nervous System Infection-This can be in the form of Meningitis, which can be deadly. 3. Epidural Infections-This can be in the form of an epidural abscess, which can cause pressure inside of the spine, causing compression of the spinal cord with subsequent paralysis. This would require an emergency surgery to decompress, and there are no guarantees that the patient would recover from the paralysis. 4. Discitis-This is an infection of the intervertebral discs.  It occurs in about 1% of discography procedures.  It is difficult to treat and it may lead to surgery.        2. Pain: the needles have to go through skin and soft tissues, will cause soreness.        3. Damage to internal structures:  The nerves to be lesioned may be near blood vessels or    other nerves which can be potentially damaged.       4. Bleeding: Bleeding is more common if the patient is taking blood thinners such as  aspirin, Coumadin, Ticiid, Plavix, etc., or if he/she have some genetic predisposition  such as hemophilia. Bleeding into the spinal canal can cause compression of the spinal  cord with subsequent paralysis.  This would require an emergency surgery to  decompress and there are no guarantees that the patient would recover from the  paralysis.       5. Pneumothorax:  Puncturing of a lung is a possibility, every time a needle is introduced in  the area of the chest or upper back.  Pneumothorax refers to free air around the  collapsed lung(s), inside of the thoracic cavity (chest cavity).  Another two possible  complications related to a similar event would include: Hemothorax and Chylothorax.   These are variations of the Pneumothorax, where instead of air around the collapsed  lung(s), you may have blood or chyle, respectively.       6. Spinal headaches: They may occur with any procedures in the area of the spine.       7. Persistent CSF (Cerebro-Spinal Fluid) leakage: This is a rare problem, but may occur  with prolonged intrathecal or epidural catheters either due to the formation of a fistulous  track or  a dural tear.       8. Nerve damage: By working so close to the spinal cord, there is always a possibility of  nerve damage, which could be as serious as a permanent spinal cord injury with  paralysis.       9. Death:  Although rare, severe deadly allergic reactions known as "Anaphylactic  reaction" can occur to any of the medications used.      10. Worsening of the symptoms:  We can always make thing worse.  What are the chances of something like this happening? Chances of any of this occuring are extremely low.  By statistics, you have more of a chance of getting killed in a  motor vehicle accident: while driving to the hospital than any of the above occurring .  Nevertheless, you should be aware that they are possibilities.  In general, it is similar to taking a shower.  Everybody knows that you can slip, hit your head and get killed.  Does that mean that you should not shower again?  Nevertheless always keep in mind that statistics do not mean anything if you happen to be on the wrong side of them.  Even if a procedure has a 1 (one) in a 1,000,000 (million) chance of going wrong, it you happen to be that one..Also, keep in mind that by statistics, you have more of a chance of having something go wrong when taking medications.  Who should not have this procedure? If you are on a blood thinning medication (e.g. Coumadin, Plavix, see list of "Blood Thinners"), or if you have an active infection going on, you should not have the procedure.  If you are taking any blood thinners, please inform your physician.  How should I prepare for this procedure?  Do not eat or drink anything at least six hours prior to the procedure.  Bring a driver with you .  It cannot be a taxi.  Come accompanied by an adult that can drive you back, and that is strong enough to help you if your legs get weak or numb from the local anesthetic.  Take all of your medicines the morning of the procedure with just enough water to swallow them.  If you have diabetes, make sure that you are scheduled to have your procedure done first thing in the morning, whenever possible.  If you have diabetes, take only half of your insulin dose and notify our nurse that you have done so as soon as you arrive at the clinic.  If you are diabetic, but only take blood sugar pills (oral hypoglycemic), then do not take them on the morning of your procedure.  You may take them after you have had the procedure.  Do not take aspirin or any aspirin-containing medications, at least eleven (11) days prior to the procedure.  They  may prolong bleeding.  Wear loose fitting clothing that may be easy to take off and that you would not mind if it got stained with Betadine or blood.  Do not wear any jewelry or perfume  Remove any nail coloring.  It will interfere with some of our monitoring equipment.  NOTE: Remember that this is not meant to be interpreted as a complete list of all possible complications.  Unforeseen problems may occur.  BLOOD THINNERS The following drugs contain aspirin or other products, which can cause increased bleeding during surgery and should not be taken for 2 weeks prior to and 1 week after surgery.  If  you should need take something for relief of minor pain, you may take acetaminophen which is found in Tylenol,m Datril, Anacin-3 and Panadol. It is not blood thinner. The products listed below are.  Do not take any of the products listed below in addition to any listed on your instruction sheet.  A.P.C or A.P.C with Codeine Codeine Phosphate Capsules #3 Ibuprofen Ridaura  ABC compound Congesprin Imuran rimadil  Advil Cope Indocin Robaxisal  Alka-Seltzer Effervescent Pain Reliever and Antacid Coricidin or Coricidin-D  Indomethacin Rufen  Alka-Seltzer plus Cold Medicine Cosprin Ketoprofen S-A-C Tablets  Anacin Analgesic Tablets or Capsules Coumadin Korlgesic Salflex  Anacin Extra Strength Analgesic tablets or capsules CP-2 Tablets Lanoril Salicylate  Anaprox Cuprimine Capsules Levenox Salocol  Anexsia-D Dalteparin Magan Salsalate  Anodynos Darvon compound Magnesium Salicylate Sine-off  Ansaid Dasin Capsules Magsal Sodium Salicylate  Anturane Depen Capsules Marnal Soma  APF Arthritis pain formula Dewitt's Pills Measurin Stanback  Argesic Dia-Gesic Meclofenamic Sulfinpyrazone  Arthritis Bayer Timed Release Aspirin Diclofenac Meclomen Sulindac  Arthritis pain formula Anacin Dicumarol Medipren Supac  Analgesic (Safety coated) Arthralgen Diffunasal Mefanamic Suprofen  Arthritis Strength Bufferin  Dihydrocodeine Mepro Compound Suprol  Arthropan liquid Dopirydamole Methcarbomol with Aspirin Synalgos  ASA tablets/Enseals Disalcid Micrainin Tagament  Ascriptin Doan's Midol Talwin  Ascriptin A/D Dolene Mobidin Tanderil  Ascriptin Extra Strength Dolobid Moblgesic Ticlid  Ascriptin with Codeine Doloprin or Doloprin with Codeine Momentum Tolectin  Asperbuf Duoprin Mono-gesic Trendar  Aspergum Duradyne Motrin or Motrin IB Triminicin  Aspirin plain, buffered or enteric coated Durasal Myochrisine Trigesic  Aspirin Suppositories Easprin Nalfon Trillsate  Aspirin with Codeine Ecotrin Regular or Extra Strength Naprosyn Uracel  Atromid-S Efficin Naproxen Ursinus  Auranofin Capsules Elmiron Neocylate Vanquish  Axotal Emagrin Norgesic Verin  Azathioprine Empirin or Empirin with Codeine Normiflo Vitamin E  Azolid Emprazil Nuprin Voltaren  Bayer Aspirin plain, buffered or children's or timed BC Tablets or powders Encaprin Orgaran Warfarin Sodium  Buff-a-Comp Enoxaparin Orudis Zorpin  Buff-a-Comp with Codeine Equegesic Os-Cal-Gesic   Buffaprin Excedrin plain, buffered or Extra Strength Oxalid   Bufferin Arthritis Strength Feldene Oxphenbutazone   Bufferin plain or Extra Strength Feldene Capsules Oxycodone with Aspirin   Bufferin with Codeine Fenoprofen Fenoprofen Pabalate or Pabalate-SF   Buffets II Flogesic Panagesic   Buffinol plain or Extra Strength Florinal or Florinal with Codeine Panwarfarin   Buf-Tabs Flurbiprofen Penicillamine   Butalbital Compound Four-way cold tablets Penicillin   Butazolidin Fragmin Pepto-Bismol   Carbenicillin Geminisyn Percodan   Carna Arthritis Reliever Geopen Persantine   Carprofen Gold's salt Persistin   Chloramphenicol Goody's Phenylbutazone   Chloromycetin Haltrain Piroxlcam   Clmetidine heparin Plaquenil   Cllnoril Hyco-pap Ponstel   Clofibrate Hydroxy chloroquine Propoxyphen         Before stopping any of these medications, be sure to consult the  physician who ordered them.  Some, such as Coumadin (Warfarin) are ordered to prevent or treat serious conditions such as "deep thrombosis", "pumonary embolisms", and other heart problems.  The amount of time that you may need off of the medication may also vary with the medication and the reason for which you were taking it.  If you are taking any of these medications, please make sure you notify your pain physician before you undergo any procedures.         Selective Nerve Root Block Patient Information  Description: Specific nerve roots exit the spinal canal and these nerves can be compressed and inflamed by a bulging disc and bone spurs.  By injecting steroids on  the nerve root, we can potentially decrease the inflammation surrounding these nerves, which often leads to decreased pain.  Also, by injecting local anesthesia on the nerve root, this can provide us helpful information to give to your referring doctor if it decreases your pain.  Selective nerve root blocks can be done along the spine from the neck to the low back depending on the location of your pain.   After numbing the skin with local anesthesia, a small needle is passed to the nerve root and the position of the needle is verified using x-ray pictures.  After the needle is in correct position, we then deposit the medication.  You may experience a pressure sensation while this is being done.  The entire block usually lasts less than 15 minutes.  Conditions that may be treated with selective nerve root blocks:  Low back and leg pain  Spinal stenosis  Diagnostic block prior to potential surgery  Neck and arm pain  Post laminectomy syndrome  Preparation for the injection:  6. Do not eat any solid food or dairy products within 8 hours of your appointment. 7. You may drink clear liquids up to 3 hours before an appointment.  Clear liquids include water, black coffee, juice or soda.  No milk or cream please. 8. You may take  your regular medications, including pain medications, with a sip of water before your appointment.  Diabetics should hold regular insulin (if taken separately) and take 1/2 normal NPH dose the morning of the procedure.  Carry some sugar containing items with you to your appointment. 9. A driver must accompany you and be prepared to drive you home after your procedure. 10. Bring all your current medications with you. 11. An IV may be inserted and sedation may be given at the discretion of the physician. 12. A blood pressure cuff, EKG, and other monitors will often be applied during the procedure.  Some patients may need to have extra oxygen administered for a short period. 13. You will be asked to provide medical information, including allergies, prior to the procedure.  We must know immediately if you are taking blood  Thinners (like Coumadin) or if you are allergic to IV iodine contrast (dye).  Possible side-effects: All are usually temporary  Bleeding from needle site  Light headedness  Numbness and tingling  Decreased blood pressure  Weakness in arms/legs  Pressure sensation in back/neck  Pain at injection site (several days)  Possible complications: All are extremely rare  Infection  Nerve injury  Spinal headache (a headache wore with upright position)  Call if you experience:  Fever/chills associated with headache or increased back/neck pain  Headache worsened by an upright position  New onset weakness or numbness of an extremity below the injection site  Hives or difficulty breathing (go to the emergency room)  Inflammation or drainage at the injection site(s)  Severe back/neck pain greater than usual  New symptoms which are concerning to you  Please note:  Although the local anesthetic injected can often make your back or neck feel good for several hours after the injection the pain will likely return.  It takes 3-5 days for steroids to work on the nerve  root. You may not notice any pain relief for at least one week.  If effective, we will often do a series of 3 injections spaced 3-6 weeks apart to maximally decrease your pain.    If you have any questions, please call [phone] [facility]

## 2015-08-30 NOTE — Progress Notes (Signed)
Safety precautions to be maintained throughout the outpatient stay will include: orient to surroundings, keep bed in low position, maintain call bell within reach at all times, provide assistance with transfer out of bed and ambulation.  

## 2015-08-30 NOTE — Procedures (Signed)
No note

## 2015-08-30 NOTE — Procedures (Signed)
Date of procedure: 08/30/2015  Preoperative Diagnosis:  1 bilateral leg pain with the left worse than the right 2 peripheral vascular insufficiency 3 CRPS type I  Postoperative Diagnosis: Same.  Procedure: 1. left lumbar sympathetic block at L2 and L4.  2. Fluoroscopic guidance.  Surgeon: Tod PersiaWinston Shylin Keizer, MD  Anesthesia: MAC anesthesia by the nurse and staff under my direction  Informed consent was obtained and the patient appeared to accept and understand the benefits and risks of this procedure.   Pre procedure comments:  None  Description of the Procedure:  The patient was taken to the operating room and placed in the prone position.  Intravenous sedation and MAC anesthesia was administered by the nurse and staff under my direction.  After adequate draping, the lumbar spine was viewed fluoroscopically.  The oblique view was activated to the point just when the transverse processes of L2 and L4 were visualized.  The targe area for local infiltration and needle placement was the point at which the inferior border of the transverse process crossed the lateral border of the body of L2 and L4 on the left side.  Those two target areas were infiltrated with 3 cc of 1% Lidocaine used in a 25 gauge needle.  Then two #22 gauge 3 inch needles were inserted using oblique view and tunnel vision, these were directed just towards that point of intersection between  The inferior border of the transverse process and the body of L2 and L4.  Lateral fluoroscopic views were used to assess the depths of needle insertion so that the needle was at the point of the anterolateral aspect of the body of L2 and L4 on the left side.  Aspirations ere negative for blood, CSF and other body fluids.  1 cc of Isovue 200 was injected into the needles at L2 an L4.  Adequate distribution of the dye was observed without any intravascular washout configurations.    Then 10cc of 0.25% Bupivacaine and 40 mg of Kenalog were  injected at L2 while 10 cc of 0.25% Bupivacaine and 80 mg of Kenalog were injected at the L4 level.  Post injection fluoroscopy showed adequate washout of the contrast media.  Both needles were removed and adequate hemostasis was established .  The patient tolerated the procedure quite well and vital signs are stable.  There were no adverse effects.  Additional comments: This procedure was done using fluoroscopic guidance Number of fluoroscopic frames were 4 Fluoroscopic time was 0.8 minutes MG Y was 14.2  The patient was taken to the recovery room in satisfactory condition where the patient was observed and subsequently discharged.  Will follow up in the office in 3 weeks Patient was given Roxicet 5 mg twice a day when necessary and she was given 45 tablets   Tod PersiaWinston Everado Pillsbury M.D.

## 2015-09-02 ENCOUNTER — Telehealth: Payer: Self-pay

## 2015-09-02 NOTE — Telephone Encounter (Signed)
Post procedure phone call. States she is doing fine. 

## 2015-09-20 ENCOUNTER — Ambulatory Visit: Payer: Medicare Other | Attending: Anesthesiology | Admitting: Anesthesiology

## 2015-09-20 ENCOUNTER — Encounter: Payer: Self-pay | Admitting: Anesthesiology

## 2015-09-20 VITALS — BP 170/78 | HR 66 | Temp 96.9°F | Resp 18 | Ht 67.0 in | Wt 229.0 lb

## 2015-09-20 DIAGNOSIS — M79662 Pain in left lower leg: Secondary | ICD-10-CM | POA: Diagnosis not present

## 2015-09-20 DIAGNOSIS — G90521 Complex regional pain syndrome I of right lower limb: Secondary | ICD-10-CM | POA: Diagnosis not present

## 2015-09-20 DIAGNOSIS — M79671 Pain in right foot: Secondary | ICD-10-CM | POA: Diagnosis present

## 2015-09-20 DIAGNOSIS — M79661 Pain in right lower leg: Secondary | ICD-10-CM | POA: Diagnosis not present

## 2015-09-20 DIAGNOSIS — M79672 Pain in left foot: Secondary | ICD-10-CM | POA: Diagnosis present

## 2015-09-20 MED ORDER — IOPAMIDOL (ISOVUE-M 200) INJECTION 41%
INTRAMUSCULAR | Status: AC
  Administered 2015-09-20: 12:00:00
  Filled 2015-09-20: qty 10

## 2015-09-20 MED ORDER — TRIAMCINOLONE ACETONIDE 40 MG/ML IJ SUSP
80.0000 mg | Freq: Once | INTRAMUSCULAR | Status: DC
Start: 2015-09-20 — End: 2019-12-01
  Filled 2015-09-20: qty 2

## 2015-09-20 MED ORDER — MIDAZOLAM HCL 5 MG/5ML IJ SOLN
1.0000 mg | INTRAMUSCULAR | Status: DC
  Filled 2015-09-20: qty 5

## 2015-09-20 MED ORDER — IOPAMIDOL (ISOVUE-M 200) INJECTION 41%: 20.0000 mL | INTRAMUSCULAR | Status: DC | PRN

## 2015-09-20 MED ORDER — BUPIVACAINE HCL (PF) 0.25 % IJ SOLN
20.0000 mL | Freq: Once | INTRAMUSCULAR | Status: DC
  Filled 2015-09-20: qty 30

## 2015-09-20 NOTE — Procedures (Signed)
Date of procedure:  09/20/2015  Preoperative Diagnosis:  1 pain in both legs 2 CRPS of both legs  Postoperative Diagnosis:  Same.  Procedure: 1. right lumbar sympathetic block at L2 and L4.  2. Fluoroscopic guidance.  Surgeon: Tod PersiaWinston Rylie Limburg, MD  Anesthesia: MAC anesthesia by the nursing staff under my direction  Informed consent was obtained and the patient appeared to accept and understand the benefits and risks of this procedure.   Pre procedure comments:  None  Description of the Procedure:  The patient was taken to the operating room and placed in the prone position.  Intravenous sedation and MAC anesthesia was administered by the nurse and staff under my direction.  After adequate draping, the lumbar spine was viewed fluoroscopically.  The oblique view was activated to the point just when the transverse processes of L2 and L4 were visualized.  The targe area for local infiltration and needle placement was the point at which the inferior border of the transverse process crossed the lateral border of the body of L2 and L4 on the right side.  Those two target areas were infiltrated with 3 cc of 1% Lidocaine used in a 25 gauge needle.  Then two #22 gauge 3 inch needles were inserted using oblique view and tunnel vision, these were directed just towards that point of intersection between  The inferior border of the transverse process and the body of L2 and L4.  Lateral fluoroscopic views were used to assess the depths of needle insertion so that the needle was at the point of the anterolateral aspect of the body of L2 and L4 on the right side.  Aspirations ere negative for blood, CSF and other body fluids.  1 cc of Omnipaque 200 was injected into the needles at L2 an L4.  Adequate distribution of the dye was observed without any intravascular washout configurations.    Then 10cc of 0.25% Bupivacaine and 40 mg of Kenalog were injected at L2 while 10 cc of 0.25% Bupivacaine and 40 mg  of Kenalog were injected at the L4 level.  Post injection fluoroscopy showed adequate washout of the contrast media.  Both needles were removed and adequate hemostasis was established .  The patient tolerated the procedure quite well and vital signs are stable.  There were no adverse effects.  Additional comment  This procedure was done using fluoroscopic guidance  Fluoroscopic time was 0.5 min Number of fluoroscopic frames were 4 MGY was:9.2   The patient was taken to the recovery room in satisfactory condition where the patient was observed and subsequently discharged.  Will follow up in the office in 1 month    Tod PersiaWinston Aldean Pipe M.D.

## 2015-09-20 NOTE — Patient Instructions (Addendum)
Pain Management Discharge Instructions  General Discharge Instructions :  If you need to reach your doctor call: Monday-Friday 8:00 am - 4:00 pm at [phone] or toll free [phone].  After clinic hours [phone] to have operator reach doctor.  Bring all of your medication bottles to all your appointments in the pain clinic.  To cancel or reschedule your appointment with Pain Management please remember to call 24 hours in advance to avoid a fee.  Refer to the educational materials which you have been given on: General Risks, I had my Procedure. Discharge Instructions, Post Sedation.  Post Procedure Instructions:  The drugs you were given will stay in your system until tomorrow, so for the next 24 hours you should not drive, make any legal decisions or drink any alcoholic beverages.  You may eat anything you prefer, but it is better to start with liquids then soups and crackers, and gradually work up to solid foods.  Please notify your doctor immediately if you have any unusual bleeding, trouble breathing or pain that is not related to your normal pain.  Depending on the type of procedure that was done, some parts of your body may feel week and/or numb.  This usually clears up by tonight or the next day.  Walk with the use of an assistive device or accompanied by an adult for the 24 hours.  You may use ice on the affected area for the first 24 hours.  Put ice in a Ziploc bag and cover with a towel and place against area 15 minutes on 15 minutes off.  You may switch to heat after 24 hours.GENERAL RISKS AND COMPLICATIONS  What are the risk, side effects and possible complications? Generally speaking, most procedures are safe.  However, with any procedure there are risks, side effects, and the possibility of complications.  The risks and complications are dependent upon the sites that are lesioned, or the type of nerve block to be performed.  The closer the procedure is to the spine,  the more serious the risks are.  Great care is taken when placing the radio frequency needles, block needles or lesioning probes, but sometimes complications can occur. 1. Infection: Any time there is an injection through the skin, there is a risk of infection.  This is why sterile conditions are used for these blocks.  There are four possible types of infection. 1. Localized skin infection. 2. Central Nervous System Infection-This can be in the form of Meningitis, which can be deadly. 3. Epidural Infections-This can be in the form of an epidural abscess, which can cause pressure inside of the spine, causing compression of the spinal cord with subsequent paralysis. This would require an emergency surgery to decompress, and there are no guarantees that the patient would recover from the paralysis. 4. Discitis-This is an infection of the intervertebral discs.  It occurs in about 1% of discography procedures.  It is difficult to treat and it may lead to surgery.        2. Pain: the needles have to go through skin and soft tissues, will cause soreness.       3. Damage to internal structures:  The nerves to be lesioned may be near blood vessels or    other nerves which can be potentially damaged.       4. Bleeding: Bleeding is more common if the patient is taking blood thinners such as  aspirin, Coumadin, Ticiid, Plavix, etc., or if he/she have some genetic predisposition  such as   hemophilia. Bleeding into the spinal canal can cause compression of the spinal  cord with subsequent paralysis.  This would require an emergency surgery to  decompress and there are no guarantees that the patient would recover from the  paralysis.       5. Pneumothorax:  Puncturing of a lung is a possibility, every time a needle is introduced in  the area of the chest or upper back.  Pneumothorax refers to free air around the  collapsed lung(s), inside of the thoracic cavity (chest cavity).  Another two possible  complications  related to a similar event would include: Hemothorax and Chylothorax.   These are variations of the Pneumothorax, where instead of air around the collapsed  lung(s), you may have blood or chyle, respectively.       6. Spinal headaches: They may occur with any procedures in the area of the spine.       7. Persistent CSF (Cerebro-Spinal Fluid) leakage: This is a rare problem, but may occur  with prolonged intrathecal or epidural catheters either due to the formation of a fistulous  track or a dural tear.       8. Nerve damage: By working so close to the spinal cord, there is always a possibility of  nerve damage, which could be as serious as a permanent spinal cord injury with  paralysis.       9. Death:  Although rare, severe deadly allergic reactions known as "Anaphylactic  reaction" can occur to any of the medications used.      10. Worsening of the symptoms:  We can always make thing worse.  What are the chances of something like this happening? Chances of any of this occuring are extremely low.  By statistics, you have more of a chance of getting killed in a motor vehicle accident: while driving to the hospital than any of the above occurring .  Nevertheless, you should be aware that they are possibilities.  In general, it is similar to taking a shower.  Everybody knows that you can slip, hit your head and get killed.  Does that mean that you should not shower again?  Nevertheless always keep in mind that statistics do not mean anything if you happen to be on the wrong side of them.  Even if a procedure has a 1 (one) in a 1,000,000 (million) chance of going wrong, it you happen to be that one..Also, keep in mind that by statistics, you have more of a chance of having something go wrong when taking medications.  Who should not have this procedure? If you are on a blood thinning medication (e.g. Coumadin, Plavix, see list of "Blood Thinners"), or if you have an active infection going on, you should not  have the procedure.  If you are taking any blood thinners, please inform your physician.  How should I prepare for this procedure?  Do not eat or drink anything at least six hours prior to the procedure.  Bring a driver with you .  It cannot be a taxi.  Come accompanied by an adult that can drive you back, and that is strong enough to help you if your legs get weak or numb from the local anesthetic.  Take all of your medicines the morning of the procedure with just enough water to swallow them.  If you have diabetes, make sure that you are scheduled to have your procedure done first thing in the morning, whenever possible.  If you have diabetes,   take only half of your insulin dose and notify our nurse that you have done so as soon as you arrive at the clinic.  If you are diabetic, but only take blood sugar pills (oral hypoglycemic), then do not take them on the morning of your procedure.  You may take them after you have had the procedure.  Do not take aspirin or any aspirin-containing medications, at least eleven (11) days prior to the procedure.  They may prolong bleeding.  Wear loose fitting clothing that may be easy to take off and that you would not mind if it got stained with Betadine or blood.  Do not wear any jewelry or perfume  Remove any nail coloring.  It will interfere with some of our monitoring equipment.  NOTE: Remember that this is not meant to be interpreted as a complete list of all possible complications.  Unforeseen problems may occur.  BLOOD THINNERS The following drugs contain aspirin or other products, which can cause increased bleeding during surgery and should not be taken for 2 weeks prior to and 1 week after surgery.  If you should need take something for relief of minor pain, you may take acetaminophen which is found in Tylenol,m Datril, Anacin-3 and Panadol. It is not blood thinner. The products listed below are.  Do not take any of the products listed below  in addition to any listed on your instruction sheet.  A.P.C or A.P.C with Codeine Codeine Phosphate Capsules #3 Ibuprofen Ridaura  ABC compound Congesprin Imuran rimadil  Advil Cope Indocin Robaxisal  Alka-Seltzer Effervescent Pain Reliever and Antacid Coricidin or Coricidin-D  Indomethacin Rufen  Alka-Seltzer plus Cold Medicine Cosprin Ketoprofen S-A-C Tablets  Anacin Analgesic Tablets or Capsules Coumadin Korlgesic Salflex  Anacin Extra Strength Analgesic tablets or capsules CP-2 Tablets Lanoril Salicylate  Anaprox Cuprimine Capsules Levenox Salocol  Anexsia-D Dalteparin Magan Salsalate  Anodynos Darvon compound Magnesium Salicylate Sine-off  Ansaid Dasin Capsules Magsal Sodium Salicylate  Anturane Depen Capsules Marnal Soma  APF Arthritis pain formula Dewitt's Pills Measurin Stanback  Argesic Dia-Gesic Meclofenamic Sulfinpyrazone  Arthritis Bayer Timed Release Aspirin Diclofenac Meclomen Sulindac  Arthritis pain formula Anacin Dicumarol Medipren Supac  Analgesic (Safety coated) Arthralgen Diffunasal Mefanamic Suprofen  Arthritis Strength Bufferin Dihydrocodeine Mepro Compound Suprol  Arthropan liquid Dopirydamole Methcarbomol with Aspirin Synalgos  ASA tablets/Enseals Disalcid Micrainin Tagament  Ascriptin Doan's Midol Talwin  Ascriptin A/D Dolene Mobidin Tanderil  Ascriptin Extra Strength Dolobid Moblgesic Ticlid  Ascriptin with Codeine Doloprin or Doloprin with Codeine Momentum Tolectin  Asperbuf Duoprin Mono-gesic Trendar  Aspergum Duradyne Motrin or Motrin IB Triminicin  Aspirin plain, buffered or enteric coated Durasal Myochrisine Trigesic  Aspirin Suppositories Easprin Nalfon Trillsate  Aspirin with Codeine Ecotrin Regular or Extra Strength Naprosyn Uracel  Atromid-S Efficin Naproxen Ursinus  Auranofin Capsules Elmiron Neocylate Vanquish  Axotal Emagrin Norgesic Verin  Azathioprine Empirin or Empirin with Codeine Normiflo Vitamin E  Azolid Emprazil Nuprin Voltaren  Bayer  Aspirin plain, buffered or children's or timed BC Tablets or powders Encaprin Orgaran Warfarin Sodium  Buff-a-Comp Enoxaparin Orudis Zorpin  Buff-a-Comp with Codeine Equegesic Os-Cal-Gesic   Buffaprin Excedrin plain, buffered or Extra Strength Oxalid   Bufferin Arthritis Strength Feldene Oxphenbutazone   Bufferin plain or Extra Strength Feldene Capsules Oxycodone with Aspirin   Bufferin with Codeine Fenoprofen Fenoprofen Pabalate or Pabalate-SF   Buffets II Flogesic Panagesic   Buffinol plain or Extra Strength Florinal or Florinal with Codeine Panwarfarin   Buf-Tabs Flurbiprofen Penicillamine   Butalbital Compound Four-way cold tablets   Penicillin   Butazolidin Fragmin Pepto-Bismol   Carbenicillin Geminisyn Percodan   Carna Arthritis Reliever Geopen Persantine   Carprofen Gold's salt Persistin   Chloramphenicol Goody's Phenylbutazone   Chloromycetin Haltrain Piroxlcam   Clmetidine heparin Plaquenil   Cllnoril Hyco-pap Ponstel   Clofibrate Hydroxy chloroquine Propoxyphen         Before stopping any of these medications, be sure to consult the physician who ordered them.  Some, such as Coumadin (Warfarin) are ordered to prevent or treat serious conditions such as "deep thrombosis", "pumonary embolisms", and other heart problems.  The amount of time that you may need off of the medication may also vary with the medication and the reason for which you were taking it.  If you are taking any of these medications, please make sure you notify your pain physician before you undergo any procedures.         Lumbar Sympathetic Block Patient Information  Description: The lumbar plexus is a group of nerves that are part of the sympathetic nervous system.  These nerves supply organs in the pelvis and legs.  Lumbar sympathetic blocks are utilized for the diagnosis and treatment of painful conditions in these areas.   The lumbar plexus is located on both sides of the aorta at approximately the level  of the second lumbar vertebral body.  The block will be performed with you lying on your abdomen with a pillow underneath.  Using direct x-ray guidance,   The plexus will be located on both sides of the spine.  Numbing medicine will be used to deaden the skin prior to needle insertion.  In most cases, a small amount of sedation can be give by IV prior to the numbing medicine.  One or two small needles will be placed near the plexus and local anesthetic will be injected.  This may make your leg(s) feel warm.  The Entire block usually lasts about 15-25 minutes.  Conditions which may be treated by lumbar sympathetic block:   Reflex sympathetic dystrophy  Phantom limb pain  Peripheral neuropathy  Peripheral vascular disease ( inadequate blood flow )  Cancer pain of pelvis, leg and kidney  Preparation for the injection:  1. Do note eat any solid food or diary products within 8 hours of your appointment. 2. You may drink clear liquids up to 3 hours before appointment.  Clear liquids include water, black coffee, juice or soda.  No milk or cream please. 3. You may take your regular medication, including pain medications, with a sip of water before you appointment.  Diabetics should hold regular insulin ( if taken separately ) and take 1/2 NPH dose the morning of the procedure .  Carry some sugar containing items with you to your appointment. 4. A driver must accompany you and be prepared to drive you home after your procedure. 5. Bring all your current medication with you. 6. An IV may be inserted and sedation may be given at the discretion of the physician.  7. A blood pressure cuff, EKG and other monitors will often be applied during the procedure.  Some patients may need to have extra oxygen administered for a short period. 8. You will be asked to provide medical information, including your allergies and medications, prior to the procedure.  We must know immediately if your taking blood thinners  (like Coumadin/Warfarin) or if you are allergic to IV iodine contrast (dye).  We must know if you could possibly be pregnant.  Possible side-effects   Bleeding  from needle site or deeper  Infection (rare, can require surgery)  Nerve injury (rare)  Numbness & tingling (temporary)  Collapsed lung (rare)  Spinal headache (a headache worse with upright posture)  Light-headedness (temporary)  Pain at injection site (several days)  Decreased blood pressure (temporary)  Weakness in legs (temporary)  Seizure or other drug reaction (rare)  Call if you experience:   Fever/chills associated with headache or increased back/ neck pain  Headache worsened by an upright position  New onset weakness or numbness of an extremity below the injection site  Hives or difficulty breathing ( go to the emergency room)  Inflammation or drainage at the injections site(s)  New symptoms which are concerning to you  Please note:  If effective, we will often do a series of 2-3 injections spaced 3-6 weeks apart to maximally decrease your pain.  If initial series is effective, you may be a candidate for a more permanent block of the lumbar sympathetic plexus.  If you have any questions please call [phone] [facility] Pain Management Discharge Instructions  General Discharge Instructions :  If you need to reach your doctor call: Monday-Friday 8:00 am - 4:00 pm at [phone] or toll free [phone].  After clinic hours [phone] to have operator reach doctor.  Bring all of your medication bottles to all your appointments in the pain clinic.  To cancel or reschedule your appointment with Pain Management please remember to call 24 hours in advance to avoid a fee.  Refer to the educational materials which you have been given on: General Risks, I had my Procedure. Discharge Instructions, Post Sedation.  Post Procedure Instructions:  The drugs you  were given will stay in your system until tomorrow, so for the next 24 hours you should not drive, make any legal decisions or drink any alcoholic beverages.  You may eat anything you prefer, but it is better to start with liquids then soups and crackers, and gradually work up to solid foods.  Please notify your doctor immediately if you have any unusual bleeding, trouble breathing or pain that is not related to your normal pain.  Depending on the type of procedure that was done, some parts of your body may feel week and/or numb.  This usually clears up by tonight or the next day.  Walk with the use of an assistive device or accompanied by an adult for the 24 hours.  You may use ice on the affected area for the first 24 hours.  Put ice in a Ziploc bag and cover with a towel and place against area 15 minutes on 15 minutes off.  You may switch to heat after 24 hours.

## 2015-09-20 NOTE — Progress Notes (Signed)
Patient presents today for procedure d/t bilateral foot pain.  Patient did not realize that there was tylenol in her pain medication.  Would like to discuss.   Safety precautions to be maintained throughout the outpatient stay will include: orient to surroundings, keep bed in low position, maintain call bell within reach at all times, provide assistance with transfer out of bed and ambulation.

## 2015-09-24 ENCOUNTER — Telehealth: Payer: Self-pay | Admitting: *Deleted

## 2015-09-24 NOTE — Telephone Encounter (Signed)
Spoke with patient re; procedure on Friday, verbalizes no questions or concerns.  

## 2015-10-25 ENCOUNTER — Ambulatory Visit: Payer: Medicare Other | Attending: Anesthesiology | Admitting: Anesthesiology

## 2015-10-25 ENCOUNTER — Encounter: Payer: Self-pay | Admitting: Anesthesiology

## 2015-10-25 VITALS — BP 117/77 | HR 75 | Temp 96.1°F | Resp 20 | Ht 67.0 in | Wt 224.0 lb

## 2015-10-25 DIAGNOSIS — M25572 Pain in left ankle and joints of left foot: Secondary | ICD-10-CM | POA: Diagnosis not present

## 2015-10-25 DIAGNOSIS — M79661 Pain in right lower leg: Secondary | ICD-10-CM

## 2015-10-25 DIAGNOSIS — M79605 Pain in left leg: Secondary | ICD-10-CM | POA: Insufficient documentation

## 2015-10-25 DIAGNOSIS — M79662 Pain in left lower leg: Secondary | ICD-10-CM

## 2015-10-25 DIAGNOSIS — G8929 Other chronic pain: Secondary | ICD-10-CM | POA: Insufficient documentation

## 2015-10-25 DIAGNOSIS — G90522 Complex regional pain syndrome I of left lower limb: Secondary | ICD-10-CM

## 2015-10-25 MED ORDER — OXYCODONE-ACETAMINOPHEN 5-325 MG PO TABS: 1.0000 | ORAL_TABLET | Freq: Two times a day (BID) | ORAL | Status: DC

## 2015-10-25 MED ORDER — BUPIVACAINE HCL (PF) 0.25 % IJ SOLN
20.0000 mL | Freq: Once | INTRAMUSCULAR | Status: AC
  Administered 2015-10-25: 20 mL
  Filled 2015-10-25: qty 30

## 2015-10-25 MED ORDER — OXYCODONE-ACETAMINOPHEN 5-325 MG PO TABS: 1.0000 | ORAL_TABLET | Freq: Two times a day (BID) | ORAL | Status: DC | PRN

## 2015-10-25 MED ORDER — IOPAMIDOL (ISOVUE-M 200) INJECTION 41%: 20.0000 mL | INTRAMUSCULAR | Status: DC | PRN

## 2015-10-25 MED ORDER — MIDAZOLAM HCL 5 MG/5ML IJ SOLN
1.0000 mg | INTRAMUSCULAR | Status: DC
  Filled 2015-10-25: qty 5

## 2015-10-25 MED ORDER — TRIAMCINOLONE ACETONIDE 40 MG/ML IJ SUSP
80.0000 mg | Freq: Once | INTRAMUSCULAR | Status: AC
  Administered 2015-10-25: 80 mg via INTRAMUSCULAR
  Filled 2015-10-25: qty 2

## 2015-10-25 MED ORDER — FENTANYL CITRATE (PF) 100 MCG/2ML IJ SOLN
50.0000 ug | INTRAMUSCULAR | Status: DC
  Filled 2015-10-25: qty 2

## 2015-10-25 MED ORDER — IOPAMIDOL (ISOVUE-M 200) INJECTION 41%
INTRAMUSCULAR | Status: AC
  Administered 2015-10-25: 10:00:00
  Filled 2015-10-25: qty 10

## 2015-10-25 NOTE — Progress Notes (Signed)
   Subjective:    Patient ID: Kimberly Mccarthy, female    DOB: 02-20-1956, 60 y.o.   MRN: [id number]  HPI    Review of Systems     Objective:   Physical Exam        Assessment & Plan:

## 2015-10-25 NOTE — Progress Notes (Signed)
Patient states she is here for procedure today and medication management.  Patient states she can't take 5-325 mg because she can't take the tylenol d/t liver damage.  Patient states she has an appt with her "liver Dr" October 31, 2015  Safety precautions to be maintained throughout the outpatient stay will include: orient to surroundings, keep bed in low position, maintain call bell within reach at all times, provide assistance with transfer out of bed and ambulation.

## 2015-10-25 NOTE — Patient Instructions (Signed)
Sympathetic Nerve Block, Care After Refer to this sheet in the next few weeks. These instructions provide you with information on caring for yourself after your procedure. Your health care provider may also give you more specific instructions. Your treatment has been planned according to current medical practices, but problems sometimes occur. Call your health care provider if you have any problems or questions after your procedure. WHAT TO EXPECT AFTER THE PROCEDURE After a sympathetic nerve block, it is typical to have the following:  Soreness.  Feeling of warmth.  Weakness.  Numbness. If you had a stellate ganglion block, you may also have:   Voice changes.  A droopy eyelid.  Trouble swallowing.  A stuffy nose. HOME CARE INSTRUCTIONS   Take medicines only as directed by your health care provider.  Rest at home for the first day after your procedure and then you may be able to return to your usual activities.  You may eat what you usually do.  If you have any trouble swallowing, take small bites and small sips of water until you are able to swallow normally.  You may bathe and shower.  Keep all follow-up visits as directed by your health care provider. SEEK MEDICAL CARE IF:  You have numbness that lasts longer than 8 hours.  You continue to have pain for more than 24 hours after your procedure. SEEK IMMEDIATE MEDICAL CARE IF:  You are unable to swallow.  You have chest pain or trouble breathing.   This information is not intended to replace advice given to you by your health care provider. Make sure you discuss any questions you have with your health care provider.   Document Released: 08/28/2013 Document Reviewed: 08/28/2013 Elsevier Interactive Patient Education 2016 [employer] Pain Management Discharge Instructions  General Discharge Instructions :  If you need to reach your doctor call: Monday-Friday 8:00 am - 4:00 pm at [phone] or toll free  [phone].  After clinic hours [phone] to have operator reach doctor.  Bring all of your medication bottles to all your appointments in the pain clinic.  To cancel or reschedule your appointment with Pain Management please remember to call 24 hours in advance to avoid a fee.  Refer to the educational materials which you have been given on: General Risks, I had my Procedure. Discharge Instructions, Post Sedation.  Post Procedure Instructions:  The drugs you were given will stay in your system until tomorrow, so for the next 24 hours you should not drive, make any legal decisions or drink any alcoholic beverages.  You may eat anything you prefer, but it is better to start with liquids then soups and crackers, and gradually work up to solid foods.  Please notify your doctor immediately if you have any unusual bleeding, trouble breathing or pain that is not related to your normal pain.  Depending on the type of procedure that was done, some parts of your body may feel week and/or numb.  This usually clears up by tonight or the next day.  Walk with the use of an assistive device or accompanied by an adult for the 24 hours.  You may use ice on the affected area for the first 24 hours.  Put ice in a Ziploc bag and cover with a towel and place against area 15 minutes on 15 minutes off.  You may switch to heat after 24 hours.

## 2015-10-25 NOTE — Procedures (Signed)
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  Procedure: 1. left lumbar sympathetic block at L2 and L4.  2. Fluoroscopic guidance.  Surgeon: Tod PersiaWinston Gavriel Holzhauer, MD  Anesthesia: MAC anesthesia by the nurse and staff under my direction  Informed consent was obtained and the patient appeared to accept and understand the benefits and risks of this procedure.   Pre procedure comments:  None  Description of the Procedure:  The patient was taken to the operating room and placed in the prone position.  Intravenous sedation and MAC anesthesia was administered by the nurse and staff under my direction.  After adequate draping, the lumbar spine was viewed fluoroscopically.  The oblique view was activated to the point just when the transverse processes of L2 and L4 were visualized.  The targe area for local infiltration and needle placement was the point at which the inferior border of the transverse process crossed the lateral border of the body of L2 and L4 on the left side.  Those two target areas were infiltrated with 3 cc of 1% Lidocaine used in a 25 gauge needle.  Then two #22 gauge 3 inch needles were inserted using oblique view and tunnel vision, these were directed just towards that point of intersection between  The inferior border of the transverse process and the body of L2 and L4.  Lateral fluoroscopic views were used to assess the depths of needle insertion so that the needle was at the point of the anterolateral aspect of the body of L2 and L4 on the left side.  Aspirations ere negative for blood, CSF and other body fluids.  1 cc of Omnipaque 300 was injected into the needles at L2 an L4.  Adequate distribution of the dye was observed without any intravascular washout configurations.    Then 10cc of 0.25% Bupivacaine  were injected at L2 while 10 cc of 0.25% Bupivacaine   were injected at the L4 level.  Post injection fluoroscopy showed adequate washout of the contrast media. This procedure was done using fluoroscopic guidance Fluoroscopic time was 0.8 minutes Number of fluoroscopic frames were 4 MG Y was 14.8  Both needles were removed and adequate hemostasis was established .  The patient tolerated the procedure quite well and vital signs are stable.  There were no adverse effects.  Additional comments:  The patient was taken to the recovery room in satisfactory condition where the patient was observed and subsequently discharged.  The patient was given Roxicet 5/325 mg 1 tablet twice a day # 42 tablets.  Will follow up in the office in 3 week3  [facility] Center LLCWinston Tremell Reimers M.D.

## 2015-10-30 ENCOUNTER — Telehealth: Payer: Self-pay | Admitting: *Deleted

## 2015-10-30 NOTE — Telephone Encounter (Signed)
Left voicemail for patient to call our office if there are questions or concerns re; procedure on Friday.

## 2015-10-31 ENCOUNTER — Encounter: Payer: Self-pay | Admitting: Gastroenterology

## 2015-10-31 ENCOUNTER — Other Ambulatory Visit
Admission: RE | Admit: 2015-10-31 | Discharge: 2015-10-31 | Disposition: A | Discharge status: Home / Self Care | Payer: Medicare Other | Source: Ambulatory Visit | Attending: Gastroenterology | Admitting: Gastroenterology

## 2015-10-31 ENCOUNTER — Ambulatory Visit (INDEPENDENT_AMBULATORY_CARE_PROVIDER_SITE_OTHER): Payer: Medicare Other | Admitting: Gastroenterology

## 2015-10-31 ENCOUNTER — Other Ambulatory Visit: Payer: Self-pay

## 2015-10-31 VITALS — BP 141/78 | HR 81 | Temp 98.3°F | Ht 67.0 in | Wt 229.0 lb

## 2015-10-31 DIAGNOSIS — I779 Disorder of arteries and arterioles, unspecified: Secondary | ICD-10-CM

## 2015-10-31 DIAGNOSIS — R748 Abnormal levels of other serum enzymes: Secondary | ICD-10-CM | POA: Diagnosis present

## 2015-10-31 DIAGNOSIS — I739 Peripheral vascular disease, unspecified: Secondary | ICD-10-CM

## 2015-10-31 DIAGNOSIS — I639 Cerebral infarction, unspecified: Secondary | ICD-10-CM | POA: Insufficient documentation

## 2015-10-31 HISTORY — DX: Disorder of arteries and arterioles, unspecified: I77.9

## 2015-10-31 LAB — HEPATIC FUNCTION PANEL
ALK PHOS: 81 U/L (ref 38–126)
ALT: 15 U/L (ref 14–54)
AST: 14 U/L — ABNORMAL LOW (ref 15–41)
Albumin: 3.3 g/dL — ABNORMAL LOW (ref 3.5–5.0)
TOTAL PROTEIN: 7.4 g/dL (ref 6.5–8.1)
Total Bilirubin: 0.4 mg/dL (ref 0.3–1.2)

## 2015-10-31 NOTE — Progress Notes (Signed)
[facility]  Primary Gastroenterologist:  Dr. Midge Miniumarren Darol Cush  Chief Complaint  Patient presents with  . Elevated Hepatic Enzymes  . Cirrhosis    HPI: Kimberly Mccarthy is a 60 y.o. female here For follow-up of abnormal liver enzymes. The patient has had chronically abnormal liver enzymes but had been taking large amounts of Tylenol the past. The patient states she is not taking any Tylenol anymore. The patient did have a CT scan which showed possible early cirrhosis. She denies any abdominal pain nausea vomiting fevers or chills.  Current Outpatient Prescriptions  Medication Sig Dispense Refill  . amLODipine (NORVASC) 10 MG tablet Take 10 mg by mouth daily.    Marland Kitchen. aspirin EC 81 MG tablet Take 81 mg by mouth daily.    . carvedilol (COREG) 25 MG tablet Take 25 mg by mouth 2 (two) times daily with a meal.    . cloNIDine (CATAPRES) 0.3 MG tablet Take 0.3 mg by mouth 2 (two) times daily.    Marland Kitchen. docusate sodium (COLACE) 100 MG capsule Take 100 mg by mouth 2 (two) times daily.    . Ferrous Gluconate 324 (37.5 Fe) MG TABS Take by mouth.    . ferrous sulfate 325 (65 FE) MG tablet Take 325 mg by mouth 3 (three) times daily with meals.     . gabapentin (NEURONTIN) 300 MG capsule Take 300 mg by mouth 3 (three) times daily.    . hydrALAZINE (APRESOLINE) 50 MG tablet Take 50 mg by mouth 3 (three) times daily.    . hydrochlorothiazide (MICROZIDE) 12.5 MG capsule Take 12.5 mg by mouth daily.    Marland Kitchen. levothyroxine (SYNTHROID, LEVOTHROID) 50 MCG tablet Take 50 mcg by mouth daily.    Marland Kitchen. lisinopril (PRINIVIL,ZESTRIL) 40 MG tablet Take by mouth.    . Multiple Vitamin (MULTIVITAMIN WITH MINERALS) TABS tablet Take 1 tablet by mouth daily.    Marland Kitchen. oxyCODONE-acetaminophen (ROXICET) 5-325 MG tablet Take 1 tablet by mouth 2 (two) times daily. 42 tablet 0  . oxyCODONE-acetaminophen (ROXICET) 5-325 MG tablet Take 1 tablet by mouth 2 (two) times daily between meals as needed for severe  pain. 45 tablet 0  . pantoprazole (PROTONIX) 40 MG tablet Take 40 mg by mouth 2 (two) times daily.    . pravastatin (PRAVACHOL) 20 MG tablet Take 20 mg by mouth daily.    Marland Kitchen. PROAIR HFA 108 (90 Base) MCG/ACT inhaler     . sucralfate (CARAFATE) 1 g tablet Take by mouth.    . traMADol (ULTRAM) 50 MG tablet Take by mouth.    . oxyCODONE (ROXICODONE) 5 MG immediate release tablet Take 1 tablet (5 mg total) by mouth 2 (two) times daily as needed for severe pain. (Patient not taking: Reported on 10/31/2015) 45 tablet 0   Current Facility-Administered Medications  Medication Dose Route Frequency Provider Last Rate Last Dose  . bupivacaine (PF) (MARCAINE) 0.25 % injection 20 mL  20 mL Infiltration Once Tod PersiaWinston Parris, MD      . bupivacaine (PF) (MARCAINE) 0.25 % injection 20 mL  20 mL Infiltration Once Tod PersiaWinston Parris, MD      . bupivacaine (PF) (MARCAINE) 0.25 % injection 20 mL  20 mL Infiltration Once Tod PersiaWinston Parris, MD      . fentaNYL (SUBLIMAZE) injection 50 mcg  50 mcg Intravenous UD Tod PersiaWinston Parris, MD   100 mcg at 07/02/15 1225  . fentaNYL (SUBLIMAZE) injection 50 mcg  50 mcg Intravenous UD Tod PersiaWinston Parris, MD      .  fentaNYL (SUBLIMAZE) injection 50 mcg  50 mcg Intravenous UD Tod PersiaWinston Parris, MD      . iopamidol (ISOVUE-M) 41 % intrathecal injection 20 mL  20 mL Intrathecal PRN Tod PersiaWinston Parris, MD      . iopamidol (ISOVUE-M) 41 % intrathecal injection 20 mL  20 mL Epidural PRN Tod PersiaWinston Parris, MD      . iopamidol (ISOVUE-M) 41 % intrathecal injection 20 mL  20 mL Other PRN Tod PersiaWinston Parris, MD      . iopamidol (ISOVUE-M) 41 % intrathecal injection 20 mL  20 mL Other PRN Tod PersiaWinston Parris, MD      . midazolam (VERSED) 5 MG/5ML injection 1 mg  1 mg Intravenous UD Tod PersiaWinston Parris, MD   2 mg at 07/02/15 1210  . midazolam (VERSED) 5 MG/5ML injection 1 mg  1 mg Intravenous UD Tod PersiaWinston Parris, MD      . midazolam (VERSED) 5 MG/5ML injection 1 mg  1 mg Intravenous UD Tod PersiaWinston Parris, MD      . midazolam (VERSED) 5  MG/5ML injection 1 mg  1 mg Intravenous UD Tod PersiaWinston Parris, MD      . midazolam (VERSED) 5 MG/5ML injection 1 mg  1 mg Intravenous UD Tod PersiaWinston Parris, MD      . triamcinolone acetonide [facility](KENALOG-40) injection 80 mg  80 mg Intramuscular Once Tod PersiaWinston Parris, MD      . triamcinolone acetonide [facility](KENALOG-40) injection 80 mg  80 mg Intramuscular Once Tod PersiaWinston Parris, MD      . triamcinolone acetonide [facility](KENALOG-40) injection 80 mg  80 mg Intramuscular Once Tod PersiaWinston Parris, MD        Allergies as of 10/31/2015  . (No Known Allergies)    ROS:  General: Negative for anorexia, weight loss, fever, chills, fatigue, weakness. ENT: Negative for hoarseness, difficulty swallowing , nasal congestion. CV: Negative for chest pain, angina, palpitations, dyspnea on exertion, peripheral edema.  Respiratory: Negative for dyspnea at rest, dyspnea on exertion, cough, sputum, wheezing.  GI: See history of present illness. GU:  Negative for dysuria, hematuria, urinary incontinence, urinary frequency, nocturnal urination.  Endo: Negative for unusual weight change.    Physical Examination:   BP 141/78 mmHg  Pulse 81  Temp(Src) 98.3 F (36.8 C) (Oral)  Ht 5\' 7"  (1.702 m)  Wt 229 lb (103.874 kg)  BMI 35.86 kg/m2  General: Well-nourished, well-developed in no acute distress.  Eyes: No icterus. Conjunctivae pink. Mouth: Oropharyngeal mucosa moist and pink , no lesions erythema or exudate. Lungs: Clear to auscultation bilaterally. Non-labored. Heart: Regular rate and rhythm, no murmurs rubs or gallops.  Abdomen: Bowel sounds are normal, nontender, nondistended, no hepatosplenomegaly or masses, no abdominal bruits or hernia , no rebound or guarding.   Extremities: No lower extremity edema. No clubbing or deformities. Neuro: Alert and oriented x 3.  Grossly intact. Skin: Warm and dry, no jaundice.   Psych: Alert and cooperative, normal mood and affect.  Labs:    Imaging Studies: No results found.  Assessment and  Plan:   Kimberly Mccarthy is a 60 y.o. y/o female who has a history of chronic pain and is followed by the pain clinic. The patient has also had abnormal liver enzymes in the past and had a CT scan suggestive of early cirrhosis. The patient will have a ultrasound of the right upper quadrant and she will have her liver enzymes rechecked. The patient has been explained the plan and agrees with it.   Note: This dictation was prepared with Dragon dictation along with smaller  Company secretary. Any transcriptional errors that result from this process are unintentional.

## 2015-10-31 NOTE — Patient Instructions (Signed)
[facility] are [facility] Monday, July 10th @ 8:45am. You are to arrive at 8:30am. You are to not eat or drink after midnight Sunday night. Please call central scheduling at [phone] if you need to reschedule.

## 2015-11-04 ENCOUNTER — Ambulatory Visit: Payer: Medicare Other

## 2015-11-05 ENCOUNTER — Telehealth: Payer: Self-pay

## 2015-11-05 NOTE — Telephone Encounter (Signed)
Pt notified of lab results

## 2015-11-05 NOTE — Telephone Encounter (Signed)
-----   Message from Midge Miniumarren Wohl, MD sent at 11/04/2015 12:04 PM EDT ----- Let the patient know that her liver enzymes have come back to normal.

## 2015-11-06 ENCOUNTER — Ambulatory Visit
Admission: RE | Admit: 2015-11-06 | Discharge: 2015-11-06 | Disposition: A | Discharge status: Home / Self Care | Payer: Medicare Other | Source: Ambulatory Visit | Attending: Gastroenterology | Admitting: Gastroenterology

## 2015-11-06 DIAGNOSIS — R748 Abnormal levels of other serum enzymes: Secondary | ICD-10-CM | POA: Diagnosis present

## 2015-11-07 ENCOUNTER — Telehealth: Payer: Self-pay

## 2015-11-07 NOTE — Telephone Encounter (Signed)
-----   Message from Midge Miniumarren Wohl, MD sent at 11/07/2015  1:02 PM EDT ----- Let the patient know that her liver showed some fatty liver but her liver enzymes have come back to normal.

## 2015-11-07 NOTE — Telephone Encounter (Signed)
Left vm for pt to return my call.  

## 2015-11-08 NOTE — Telephone Encounter (Signed)
Pt notified of US results.  

## 2015-11-15 ENCOUNTER — Ambulatory Visit: Payer: Medicare Other | Admitting: Anesthesiology

## 2016-01-01 ENCOUNTER — Telehealth: Payer: Self-pay | Admitting: *Deleted

## 2016-01-16 ENCOUNTER — Ambulatory Visit: Payer: Medicare Other | Admitting: Pain Medicine

## 2017-02-06 IMAGING — CT CT HEAD W/O CM
1 series · 16 of 30 positions shown, 20 images · non-contrast
Comparison: 12/02/2014

CLINICAL DATA: Upper abdominal pain since 7277 hours, altered
mental status, confusion, history hypertension, stroke, smoking

EXAM:
CT HEAD WITHOUT CONTRAST
TECHNIQUE: Contiguous axial images were obtained from the base of the skull
through the vertex without intravenous contrast.

[Series 2: soft tissue · axial · 0.43mm/px · z∈[-167,-22]mm · 16 of 33 slices shown, 20 images]
[im 2/33  brain]
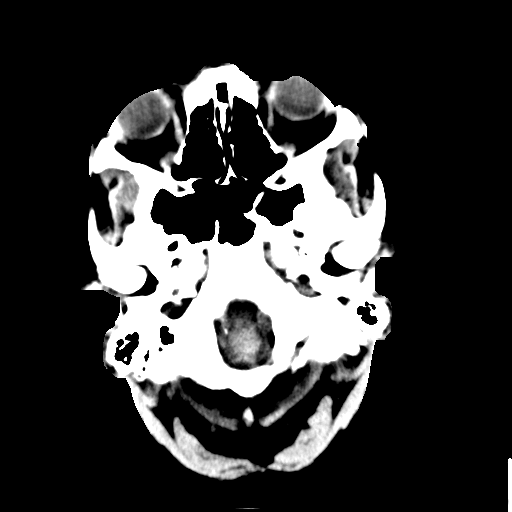
[im 2/33  bone]
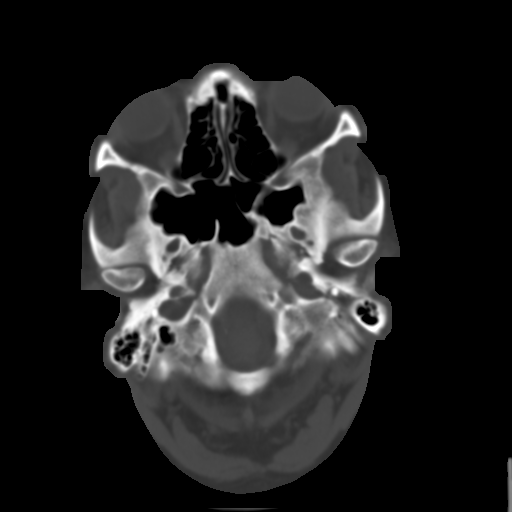
[im 4/33  brain]
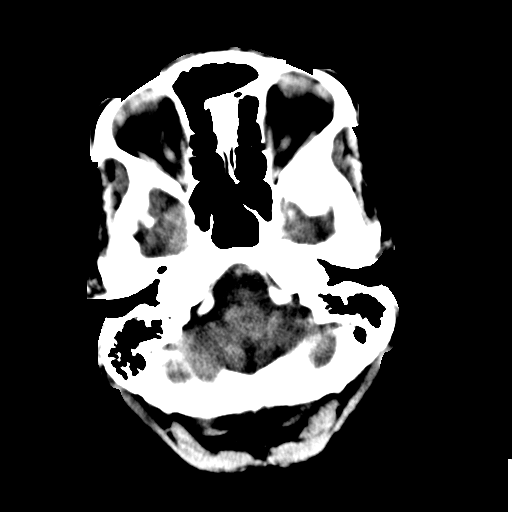
[im 6/33  brain]
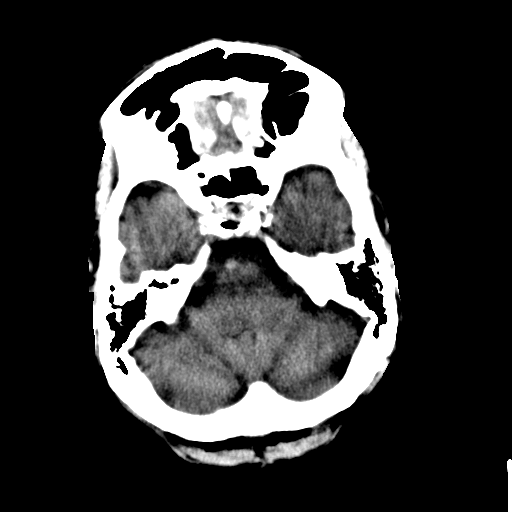
[im 8/33  brain]
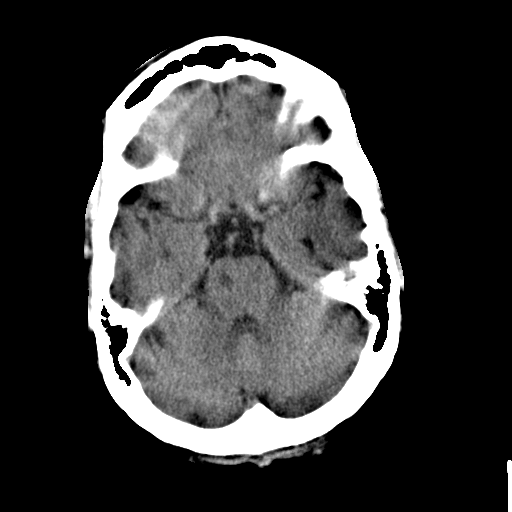
[im 9/33  brain]
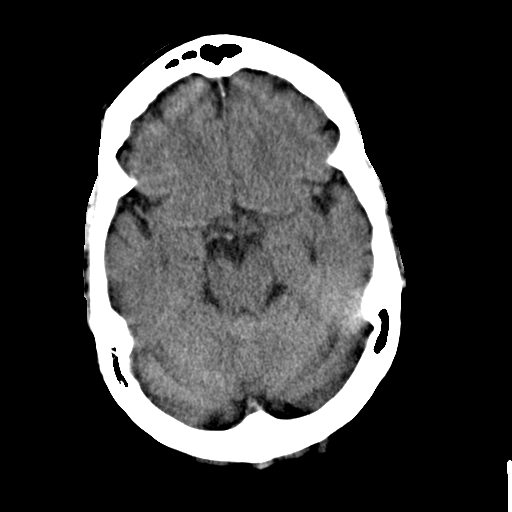
[im 9/33  bone]
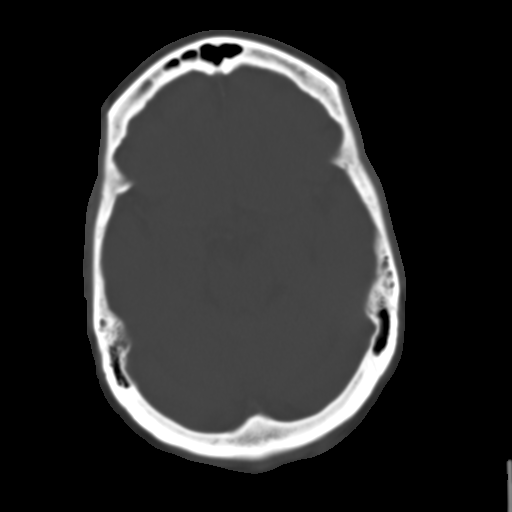
[im 12/33  brain]
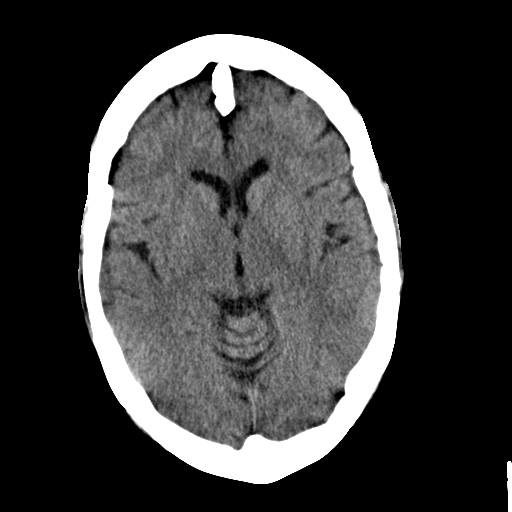
[im 14/33  brain]
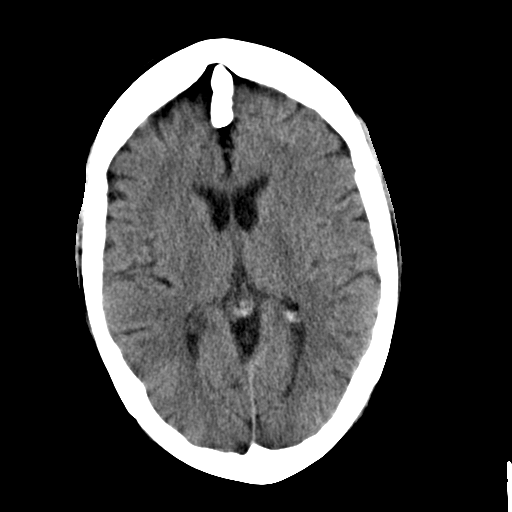
[im 16/33  brain]
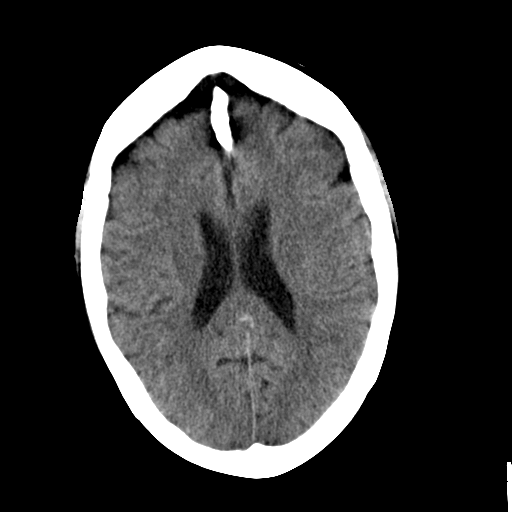
[im 17/33  brain]
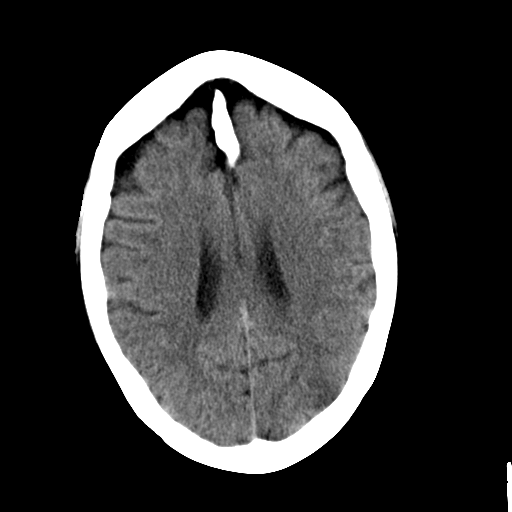
[im 17/33  bone]
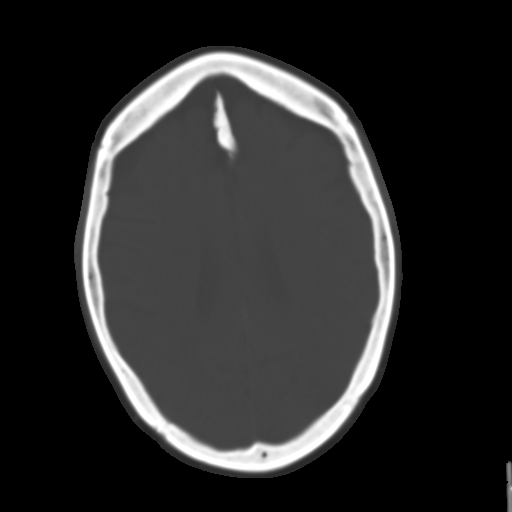
[im 19/33  brain]
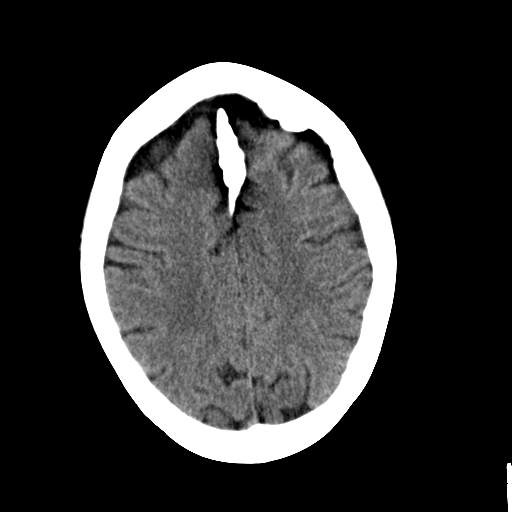
[im 21/33  brain]
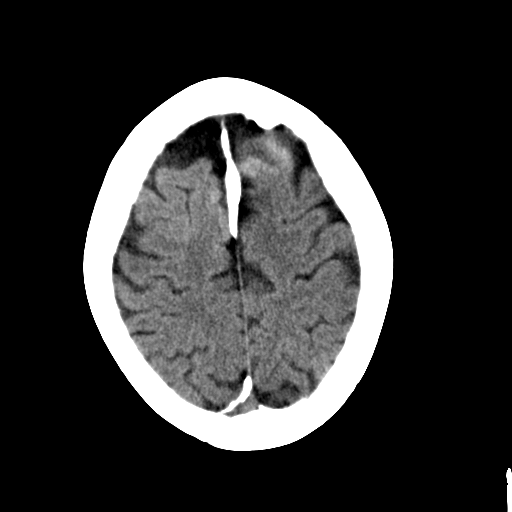
[im 24/33  brain]
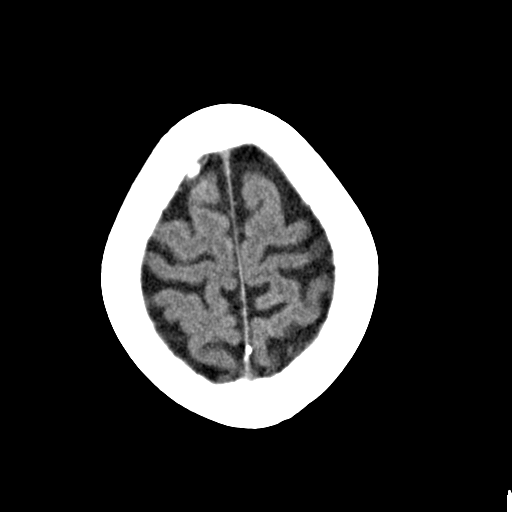
[im 25/33  brain]
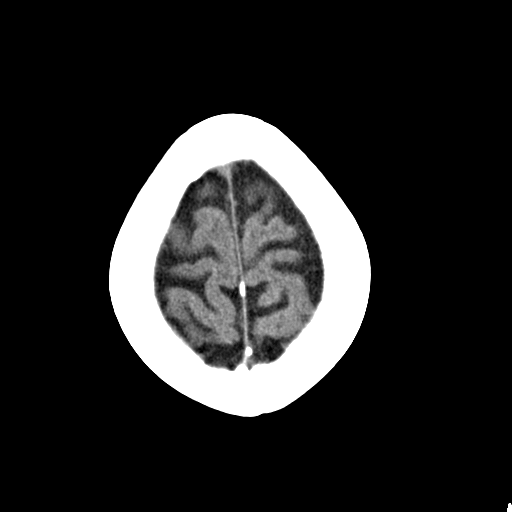
[im 25/33  bone]
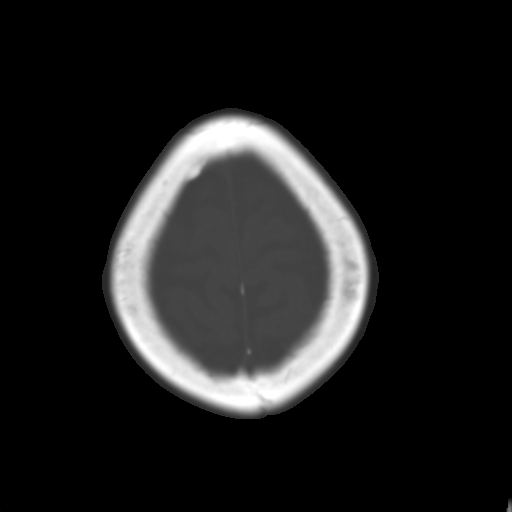
[im 27/33  brain]
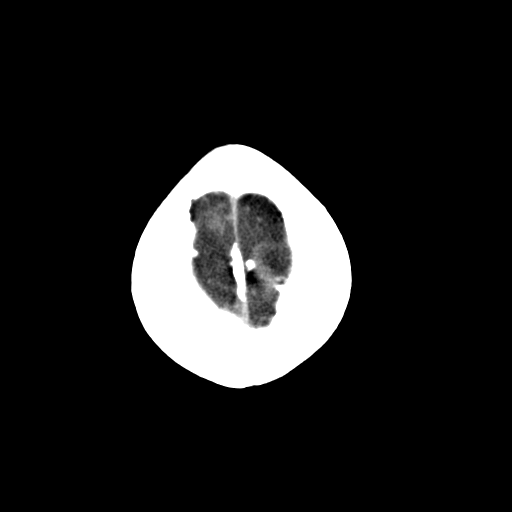
[im 29/33  brain]
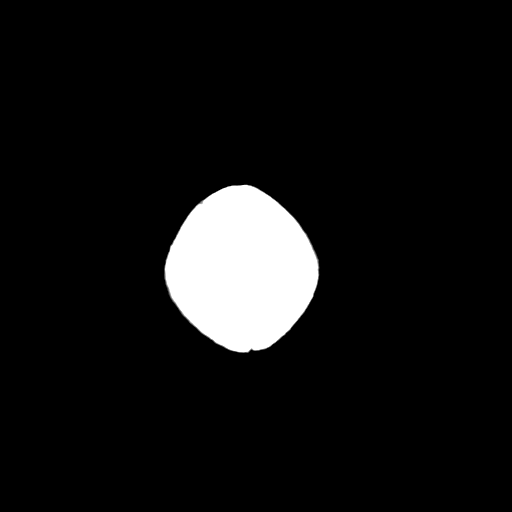
[im 31/33  brain]
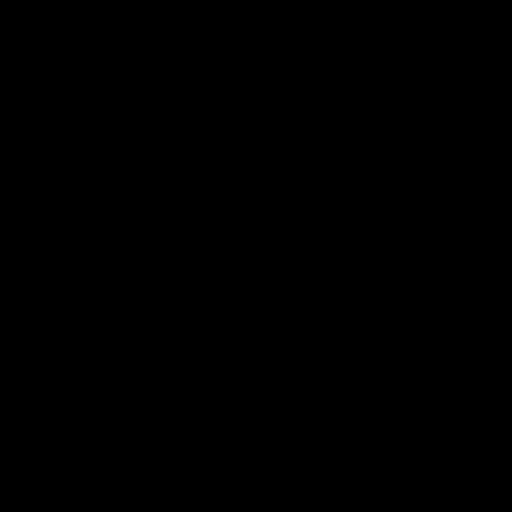

[16 of 30 positions shown; findings below may reference images not displayed]

FINDINGS: Generalized atrophy.

Normal ventricular morphology.

No midline shift or mass effect.

Old lacunar infarct RIGHT pons.

Dense dural calcifications within falx.

No intracranial hemorrhage, mass lesion or evidence acute
infarction.

No extra-axial fluid collections.

Atherosclerotic calcifications at the carotid siphons.

Visualized paranasal sinuses and mastoid air cells clear.

Calvaria intact.
IMPRESSION: Mild generalized atrophy.

Old RIGHT pontine lacunar infarct.

No acute intracranial abnormalities.

## 2017-02-06 IMAGING — CT CT ANGIO CHEST
2 of 6 series · 16 of 36 positions shown · IV contrast (APPLIED)
Comparison: None.

CLINICAL DATA: Chest and abdominal pain. History of atherosclerotic
peripheral vascular disease with intermittent claudication. Previous
right femoral bypass in 0939 and left femoral popliteal bypass in
3157.

EXAM:
CT ANGIOGRAPHY CHEST, ABDOMEN AND PELVIS
TECHNIQUE: Multidetector CT imaging through the chest, abdomen and pelvis was
performed using the standard protocol during bolus administration of
intravenous contrast. Multiplanar reconstructed images and MIPs were
obtained and reviewed to evaluate the vascular anatomy.
CONTRAST:  125mL OMNIPAQUE IOHEXOL 350 MG/ML SOLN

[Series 6: arterial · axial · arterial · 0.67mm/px · z∈[-652,-108]mm · 15 of 300 slices shown]
[im 14/300  lung]
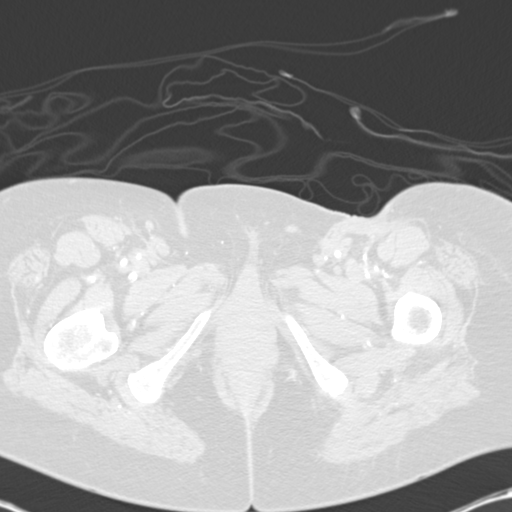
[im 41/300  mediastinal]
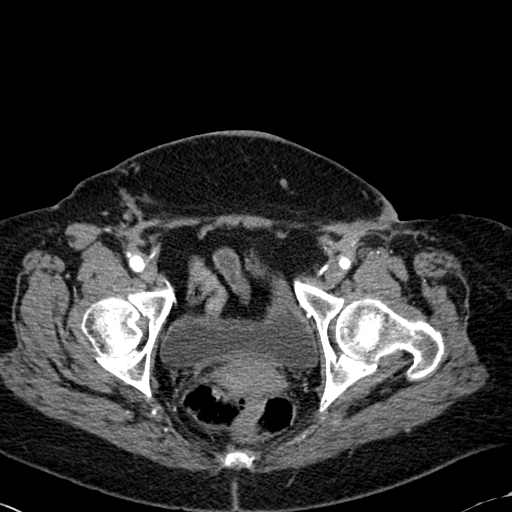
[im 55/300  lung]
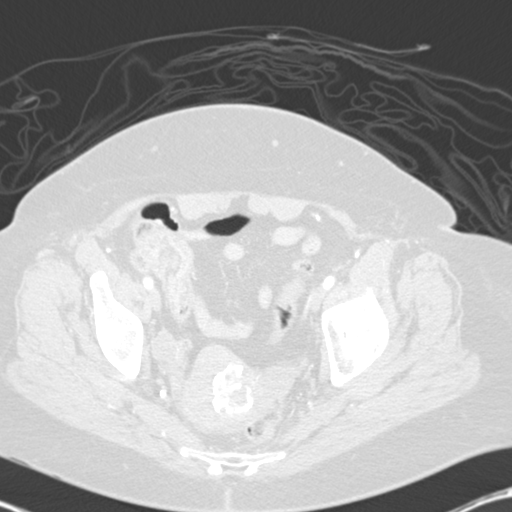
[im 68/300  mediastinal]
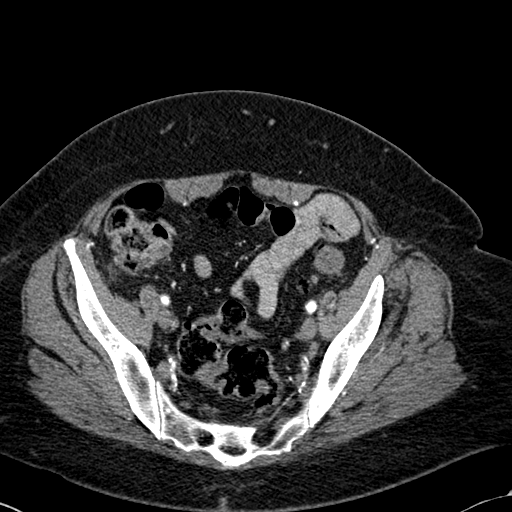
[im 96/300  lung]
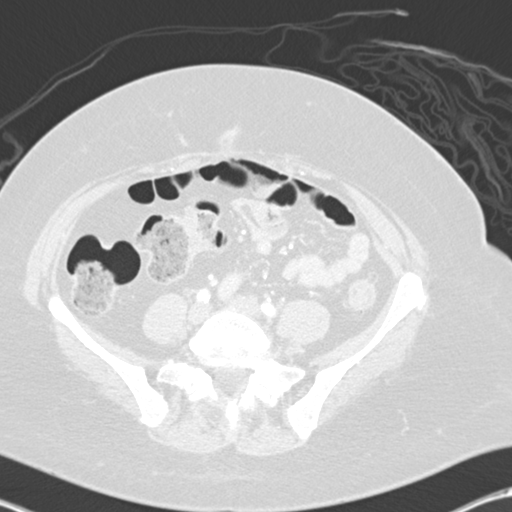
[im 109/300  mediastinal]
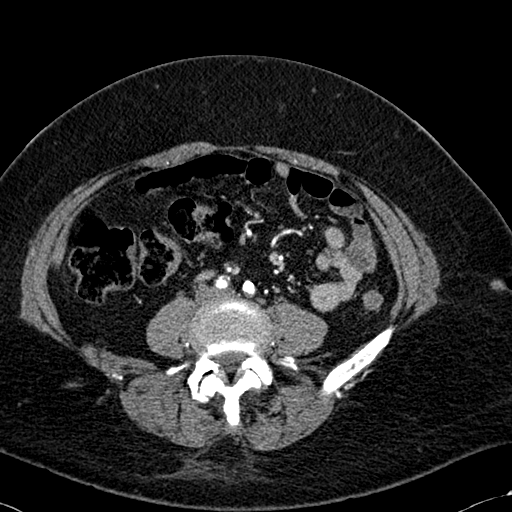
[im 136/300  lung]
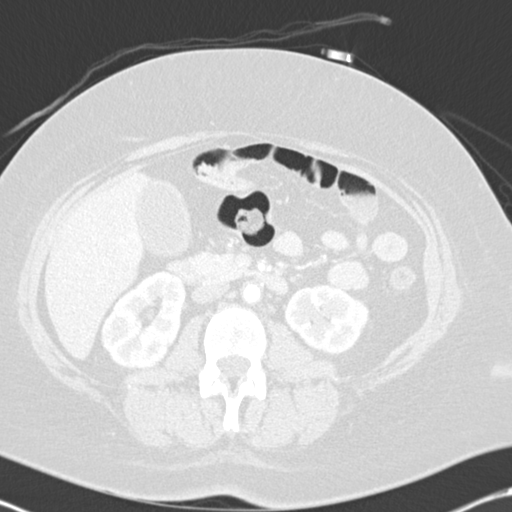
[im 150/300  mediastinal]
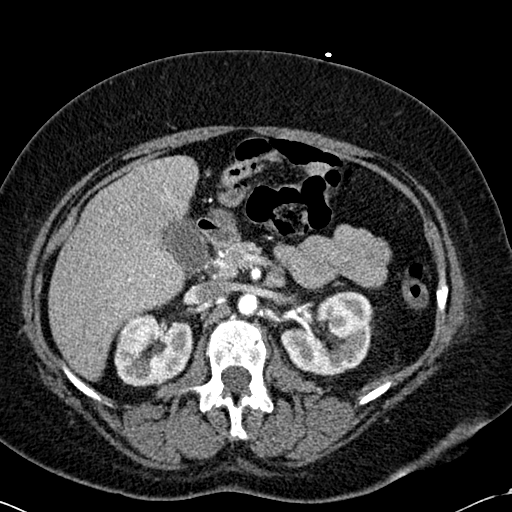
[im 164/300  lung]
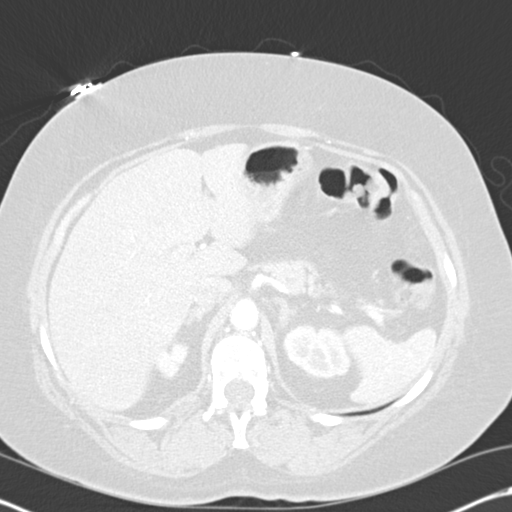
[im 191/300  mediastinal]
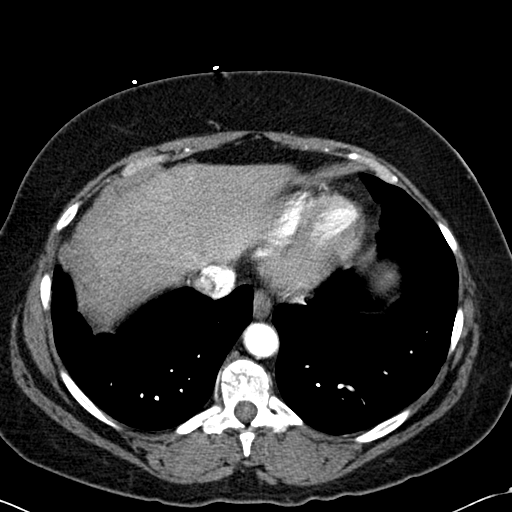
[im 204/300  lung]
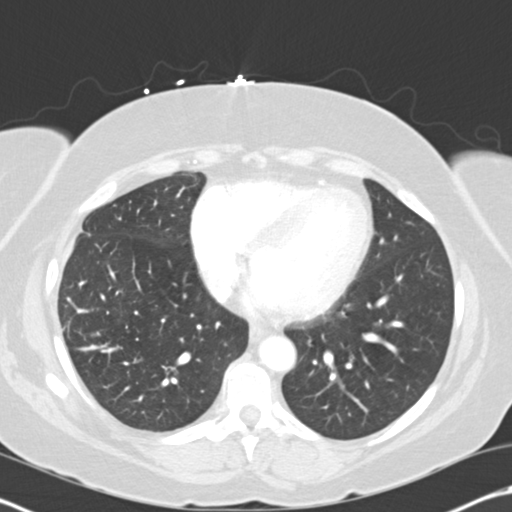
[im 232/300  mediastinal]
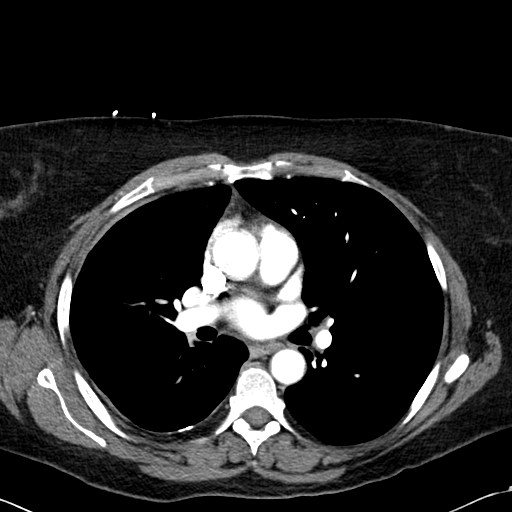
[im 245/300  lung]
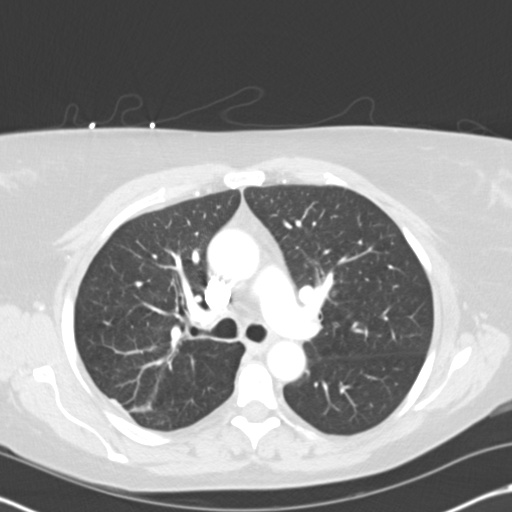
[im 259/300  mediastinal]
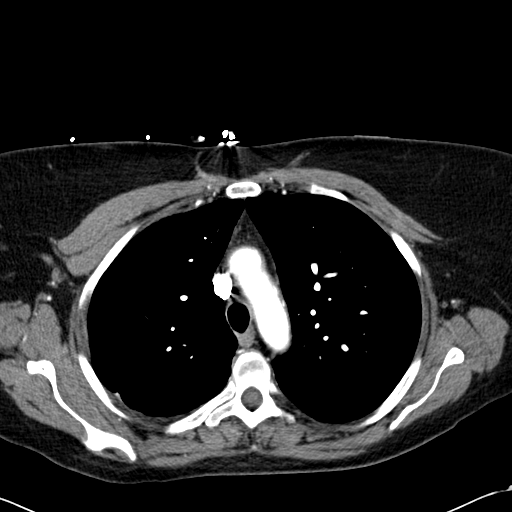
[im 286/300  lung]
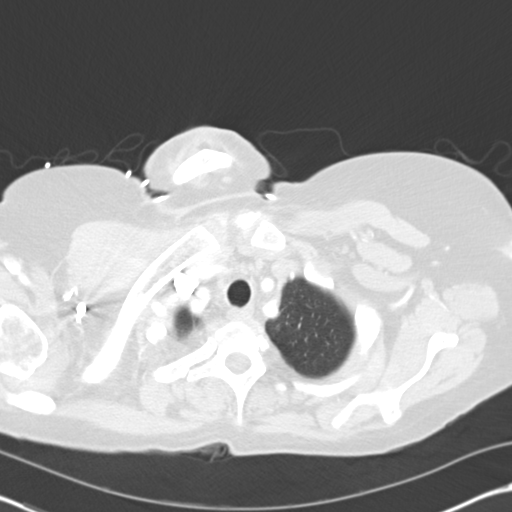

[Series 8: cor arterial mpr · coronal · arterial · 0.71mm/px · 1 of 162 slices shown]
[im 81/162  mediastinal]
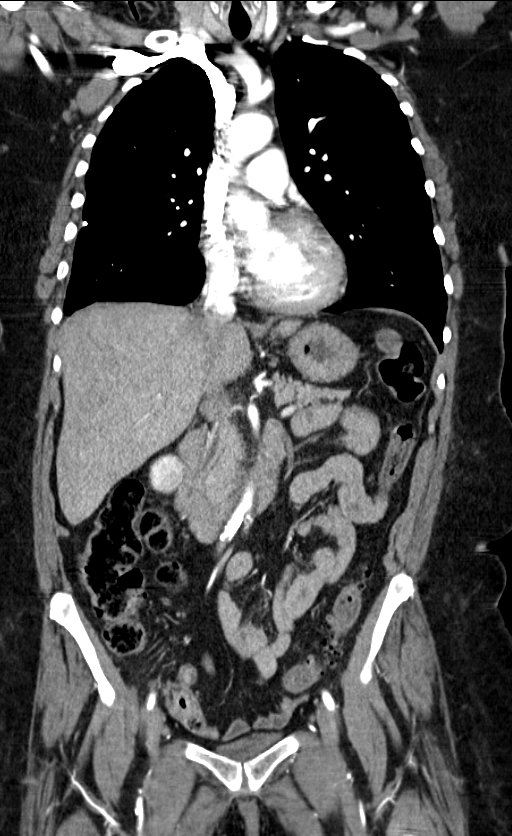

[16 of 36 positions shown; findings below may reference images not displayed]

FINDINGS: CTA CHEST FINDINGS

Mediastinum/Nodes: There is atherosclerosis of the aorta, great
vessels and coronary arteries. There is a bovine arch. The ascending
aorta measures 3.5 cm. There is no focal aneurysm or dissection. The
pulmonary arteries are well opacified with contrast. There is no
evidence of acute pulmonary embolism. The thyroid gland, trachea and
esophagus demonstrate no significant findings. The heart size is
normal. There is no pericardial effusion. There are no enlarged
mediastinal, hilar or axillary lymph nodes.

Lungs/Pleura: There is no pleural effusion. Mild emphysematous
changes are present. There are probable postsurgical changes in the
right hemithorax with peripheral scarring in the right lung. There
is no suspicious pulmonary nodule, endobronchial lesion or confluent
airspace opacity.

Musculoskeletal/Chest wall: No chest wall mass or suspicious osseous
findings.

CT ABDOMEN AND PELVIS FINDINGS

Hepatobiliary: The liver demonstrates mild contour irregularity
suggestive of early cirrhosis. No focal abnormality identified. Mild
nonspecific gallbladder wall thickening. No evidence of calcified
gallstone, pericholecystic fluid or biliary dilatation.

Pancreas: Unremarkable. No pancreatic ductal dilatation or
surrounding inflammatory changes.

Spleen: Normal in size without focal abnormality.

Adrenals/Urinary Tract: Both adrenal glands appear normal. There is
mild cortical lobularity of the kidneys. No evidence of renal mass
or hydronephrosis. No evidence of urinary tract calculus. The
bladder appears normal.

Stomach/Bowel: No evidence of bowel wall thickening, distention or
surrounding inflammatory change. There are diverticular changes
within the descending and sigmoid colon.

Vascular/Lymphatic: There are no enlarged abdominal or pelvic lymph
nodes. There is aortoiliac atherosclerosis with mural thickening.
The celiac trunk, SMA and IMA are patent. There is mild narrowing of
the renal arteries, greater on the left. There is also mild
narrowing of the common and external iliac arteries bilaterally.
There are postsurgical changes in both groins consistent with
femoral bypass. Distal to the anastomoses, the femoral arteries
and/or grafts appear stenotic bilaterally.

Reproductive: Calcified uterine fibroid.  No adnexal mass.

Other: No evidence of abdominal wall mass or hernia.

Musculoskeletal: No acute or significant osseous findings. There is
advanced facet disease in the lower lumbar spine.

Review of the MIP images confirms the above findings.
IMPRESSION: 1. There is diffuse atherosclerosis as described without evidence of
large vessel occlusion. There is no significant stenosis of the
great vessels or visceral arteries, although some narrowing of the
renal arteries bilaterally is suspected. In addition, the femoral
arteries and/or grafts distal to the bilateral femoral bypasses
appear narrowed, incompletely visualized.
2. No secondary signs of mesenteric ischemia.
3. Postsurgical changes peripherally in the right lung with linear
scarring.
4. Additional incidental findings include possible early cirrhosis,
calcified uterine fibroids and distal colonic diverticulosis.

## 2017-05-19 ENCOUNTER — Other Ambulatory Visit: Payer: Self-pay | Admitting: Family Medicine

## 2017-05-19 DIAGNOSIS — Z13820 Encounter for screening for osteoporosis: Secondary | ICD-10-CM

## 2017-05-19 DIAGNOSIS — Z1239 Encounter for other screening for malignant neoplasm of breast: Secondary | ICD-10-CM

## 2017-05-19 DIAGNOSIS — Z1231 Encounter for screening mammogram for malignant neoplasm of breast: Secondary | ICD-10-CM

## 2017-06-22 ENCOUNTER — Ambulatory Visit
Admission: RE | Admit: 2017-06-22 | Discharge: 2017-06-22 | Disposition: A | Discharge status: Home / Self Care | Payer: Medicare Other | Source: Ambulatory Visit | Attending: Family Medicine | Admitting: Family Medicine

## 2017-06-22 DIAGNOSIS — M85851 Other specified disorders of bone density and structure, right thigh: Secondary | ICD-10-CM | POA: Diagnosis not present

## 2017-06-22 DIAGNOSIS — Z13820 Encounter for screening for osteoporosis: Secondary | ICD-10-CM | POA: Diagnosis not present

## 2017-06-22 DIAGNOSIS — Z1231 Encounter for screening mammogram for malignant neoplasm of breast: Secondary | ICD-10-CM | POA: Insufficient documentation

## 2017-06-22 DIAGNOSIS — Z1239 Encounter for other screening for malignant neoplasm of breast: Secondary | ICD-10-CM

## 2017-08-09 ENCOUNTER — Emergency Department: Payer: Medicare Other

## 2017-08-09 ENCOUNTER — Emergency Department
Admission: EM | Admit: 2017-08-09 | Discharge: 2017-08-09 | Disposition: A | Discharge status: Home / Self Care | Payer: Medicare Other | Attending: Emergency Medicine | Admitting: Emergency Medicine

## 2017-08-09 ENCOUNTER — Encounter: Payer: Self-pay | Admitting: Emergency Medicine

## 2017-08-09 ENCOUNTER — Other Ambulatory Visit: Payer: Self-pay

## 2017-08-09 DIAGNOSIS — M25552 Pain in left hip: Secondary | ICD-10-CM | POA: Insufficient documentation

## 2017-08-09 DIAGNOSIS — Z87891 Personal history of nicotine dependence: Secondary | ICD-10-CM | POA: Diagnosis not present

## 2017-08-09 DIAGNOSIS — Z8673 Personal history of transient ischemic attack (TIA), and cerebral infarction without residual deficits: Secondary | ICD-10-CM | POA: Insufficient documentation

## 2017-08-09 DIAGNOSIS — N189 Chronic kidney disease, unspecified: Secondary | ICD-10-CM | POA: Diagnosis not present

## 2017-08-09 DIAGNOSIS — Z79899 Other long term (current) drug therapy: Secondary | ICD-10-CM | POA: Diagnosis not present

## 2017-08-09 DIAGNOSIS — I129 Hypertensive chronic kidney disease with stage 1 through stage 4 chronic kidney disease, or unspecified chronic kidney disease: Secondary | ICD-10-CM | POA: Diagnosis not present

## 2017-08-09 DIAGNOSIS — E039 Hypothyroidism, unspecified: Secondary | ICD-10-CM | POA: Insufficient documentation

## 2017-08-09 DIAGNOSIS — M545 Low back pain, unspecified: Secondary | ICD-10-CM

## 2017-08-09 DIAGNOSIS — Z7982 Long term (current) use of aspirin: Secondary | ICD-10-CM | POA: Insufficient documentation

## 2017-08-09 DIAGNOSIS — Z7902 Long term (current) use of antithrombotics/antiplatelets: Secondary | ICD-10-CM | POA: Insufficient documentation

## 2017-08-09 MED ORDER — PREDNISONE 10 MG (21) PO TBPK: ORAL_TABLET | ORAL | 0 refills | Status: DC

## 2017-08-09 MED ORDER — DIAZEPAM 2 MG PO TABS: 2.0000 mg | ORAL_TABLET | Freq: Four times a day (QID) | ORAL | 0 refills | Status: DC | PRN

## 2017-08-09 MED ORDER — OXYCODONE-ACETAMINOPHEN 5-325 MG PO TABS
1.0000 | ORAL_TABLET | Freq: Once | ORAL | Status: AC
  Administered 2017-08-09: 1 via ORAL
  Filled 2017-08-09: qty 1

## 2017-08-09 NOTE — ED Provider Notes (Signed)
[facility] Emergency Department Provider Note  ____________________________________________   First MD Initiated Contact with Patient 08/09/17 1015     (approximate)  I have reviewed the triage vital signs and the nursing notes.   HISTORY  Chief Complaint Hip Pain    HPI Kimberly Mccarthy is a 62 y.o. female presents emergency department complaining of left hip and lower back pain.  She states she fell about 3 months ago and pain has been increasing since then.  She states she cannot hardly walk now.  She denies any new injuries.  She denies any numbness or tingling.  She states she was given a bone density test by her regular doctor and she told her she was not putting her on medication for osteoporosis this time.  Past Medical History:  Diagnosis Date  . Accelerated hypertension 09/05/2014  . Anemia   . Anemia   . Arterial disease (HCC) 01/11/2014  . AVM (arteriovenous malformation)   . Carotid artery disease (HCC) 10/31/2015  . Carotid artery stenosis   . Critical ischemia of lower extremity 07/18/2014  . H/O blood clots   . Hyperkalemia   . Hypertension   . Hypothyroidism 09/05/2014  . PVD (peripheral vascular disease) (HCC)   . Renal failure   . Stroke [facility](HCC)    may 2015  . Thyroid disease     Patient Active Problem List   Diagnosis Date Noted  . Carotid artery disease (HCC) 10/31/2015  . Pain in both lower legs 08/30/2015  . CRPS (complex regional pain syndrome) type I of lower limb 08/30/2015  . Intermittent claudication (HCC) 01/03/2015  . Chest pain 12/04/2014  . Acute gastroenteritis 12/04/2014  . Elevated liver enzymes   . Abdominal pain, epigastric   . HTN (hypertension), malignant 12/02/2014  . Accelerated hypertension 09/05/2014  . Hypertension 09/05/2014  . Gastritis without bleeding 09/05/2014  . Elevated troponin 09/05/2014  . AKI (acute kidney injury) (HCC) 09/05/2014  . Hypothyroidism 09/05/2014  . Critical ischemia of  lower extremity 07/18/2014  . High potassium 02/03/2014  . Arterial disease (HCC) 01/11/2014  . Acid reflux 11/23/2013  . Cerebrovascular accident, old 11/23/2013  . Chronic kidney disease 08/02/2013    Past Surgical History:  Procedure Laterality Date  . FEMORAL ENDARTERECTOMY Right   . left foot surgery  02/2014  . LEG SURGERY Left     Prior to Admission medications   Medication Sig Start Date End Date Taking? Authorizing Provider  amLODipine (NORVASC) 10 MG tablet Take 10 mg by mouth daily.    [provider]  aspirin EC 81 MG tablet Take 81 mg by mouth daily.    [provider]  carvedilol (COREG) 25 MG tablet Take 25 mg by mouth 2 (two) times daily with a meal.    [provider]  cloNIDine (CATAPRES) 0.3 MG tablet Take 0.3 mg by mouth 2 (two) times daily.    [provider]  diazepam (VALIUM) 2 MG tablet Take 1 tablet (2 mg total) by mouth every 6 (six) hours as needed for anxiety. 08/09/17   Fisher, Kimberly BeringSusan W, PA-C  docusate sodium (COLACE) 100 MG capsule Take 100 mg by mouth 2 (two) times daily.    [provider]  Ferrous Gluconate 324 (37.5 Fe) MG TABS Take by mouth.    [provider]  ferrous sulfate 325 (65 FE) MG tablet Take 325 mg by mouth 3 (three) times daily with meals.     [provider]  gabapentin (  NEURONTIN) 300 MG capsule Take 300 mg by mouth 3 (three) times daily.    [provider]  hydrALAZINE (APRESOLINE) 50 MG tablet Take 50 mg by mouth 3 (three) times daily. 05/01/15   [provider]  hydrochlorothiazide (MICROZIDE) 12.5 MG capsule Take 12.5 mg by mouth daily.    [provider]  levothyroxine (SYNTHROID, LEVOTHROID) 50 MCG tablet Take 50 mcg by mouth daily.    [provider]  lisinopril (PRINIVIL,ZESTRIL) 40 MG tablet Take by mouth.    [provider]  Multiple Vitamin (MULTIVITAMIN WITH MINERALS) TABS tablet Take 1 tablet by mouth daily.    [provider]  pantoprazole (PROTONIX) 40 MG tablet Take 40 mg by mouth 2 (two) times daily.    [provider]  pravastatin (PRAVACHOL) 20 MG tablet Take 20 mg by mouth daily. 05/01/15   [provider]  predniSONE (STERAPRED UNI-PAK 21 TAB) 10 MG (21) TBPK tablet Take 6 pills on day one then decrease by 1 pill each day 08/09/17   Bartholomew Boards  [facility] HFA 108 [phone] Base) MCG/ACT inhaler  10/11/15   [provider]  sucralfate (CARAFATE) 1 g tablet Take by mouth.    [provider]    Allergies Patient has no known allergies.  Family History  Problem Relation Age of Onset  . Diabetes Mother   . Hypertension Mother   . Hypertension Sister   . Thyroid disease Sister   . Breast cancer Neg Hx     Social History Social History   Tobacco Use  . Smoking status: Former Smoker    Packs/day: 0.33    Years: 20.00    Pack years: 6.60    Types: Cigarettes  . Smokeless tobacco: Never Used  Substance Use Topics  . Alcohol use: No    Alcohol/week: 0.0 oz  . Drug use: No    Review of Systems  Constitutional: No fever/chills Eyes: No visual changes. ENT: No sore throat. Respiratory: Denies cough Genitourinary: Negative for dysuria. Musculoskeletal: Positive for back pain.  Positive for left hip pain Skin: Negative for rash.    ____________________________________________   PHYSICAL EXAM:  VITAL SIGNS: ED Triage Vitals [08/09/17 0929]  Enc Vitals Group     BP (!) 169/70     Pulse Rate 70     Resp 18     Temp 97.7 F (36.5 C)     Temp Source Oral     SpO2 98 %     Weight 260 lb (117.9 kg)     Height 5\' 6"  (1.676 m)     Head Circumference      Peak Flow      Pain Score 10     Pain Loc      Pain Edu?      Excl. in GC?     Constitutional: Alert and oriented. Well appearing and in no acute distress. Eyes: Conjunctivae are normal.  Head: Atraumatic. Nose: No congestion/rhinnorhea. Mouth/Throat: Mucous membranes are moist.     Cardiovascular: Normal rate, regular rhythm.  Heart sounds are normal Respiratory: Normal respiratory effort.  No retractions, lungs clear to auscultation GU: deferred Musculoskeletal: FROM all extremities, warm and well perfused.  Lumbar spine is tender to palpation.  The left hip is tender to palpation.  Pain is reproduced with internal and external rotation.  She is neurovascularly intact Neurologic:  Normal speech and language.  Skin:  Skin is warm, dry and intact. No rash noted. Psychiatric: Mood  and affect are normal. Speech and behavior are normal.  ____________________________________________   LABS (all labs ordered are listed, but only abnormal results are displayed)  Labs Reviewed - No data to display ____________________________________________   ____________________________________________  RADIOLOGY  X-ray left hip is negative for fracture X-ray of the lumbar spine is negative for fracture  ____________________________________________   PROCEDURES  Procedure(s) performed: No  Procedures    ____________________________________________   INITIAL IMPRESSION / ASSESSMENT AND PLAN / ED COURSE  Pertinent labs & imaging results that were available during my care of the patient were reviewed by me and considered in my medical decision making (see chart for details).  Patient is 62 year old female presents emergency department complaining of left hip and lower back pain after a fall 3 months ago.  She has not been evaluated for this fall.  She states pain continues to worsen.  She states she can barely bear weight on the left leg.  On physical exam patient is tender to palpation in the lumbar spine and pain is reproduced with movement of the left hip.  X-ray of the lumbar spine and left hip are ordered Patient was given a Percocet p.o.   X-ray results were discussed with the patient.  She has been given a Percocet 5/325 while in the ED.  She states her pain is  much better after this medication.  She was instructed to follow-up with orthopedics or her regular doctor if not improving in 3 days.  She states she understands comply with instructions.  She was given a prescription for Sterapred 10 mg 6-day Dosepak, Valium 2 mg every 6 hours for muscle spasms.  She is to apply ice to the back and the hip.  She was discharged in stable condition  As part of my medical decision making, I reviewed the following data within the electronic MEDICAL RECORD NUMBER Nursing notes reviewed and incorporated, Old chart reviewed, Radiograph reviewed x-ray of the hip and lumbar spine are negative for fracture, Notes from prior ED visits and [place] Controlled Substance Database  ____________________________________________   FINAL CLINICAL IMPRESSION(S) / ED DIAGNOSES  Final diagnoses:  Left hip pain  Acute midline low back pain without sciatica      NEW MEDICATIONS STARTED DURING THIS VISIT:  Discharge Medication List as of 08/09/2017 11:38 AM    START taking these medications   Details  diazepam (VALIUM) 2 MG tablet Take 1 tablet (2 mg total) by mouth every 6 (six) hours as needed for anxiety., Starting Mon 08/09/2017, Print    predniSONE (STERAPRED UNI-PAK 21 TAB) 10 MG (21) TBPK tablet Take 6 pills on day one then decrease by 1 pill each day, Print         Note:  This document was prepared using Dragon voice recognition software and may include unintentional dictation errors.    Faythe Ghee, PA-C 08/09/17 1621    Schaevitz, Myra Rude, MD 08/10/17 1115

## 2017-08-09 NOTE — Discharge Instructions (Signed)
Follow-up with your regular doctor or Dr. Rosita KeaMenz if you are not better in 3-5 days.  Please call for an appointment.  Use medication as prescribed.  Return to the emergency department if you are worsening

## 2017-08-09 NOTE — ED Notes (Signed)
See triage note  Present with pain to left hip area    States she had a fall in Nov  conts to have pian to left hip area

## 2017-08-09 NOTE — ED Triage Notes (Signed)
Pt states she fell 3 months ago and ever since the fall, has had pain in her left hip, worsening over the past few days. Walked with limp to triage.

## 2017-09-09 DIAGNOSIS — M545 Low back pain, unspecified: Secondary | ICD-10-CM | POA: Insufficient documentation

## 2018-05-13 ENCOUNTER — Other Ambulatory Visit: Payer: Self-pay | Admitting: Nurse Practitioner

## 2018-05-13 DIAGNOSIS — Z1231 Encounter for screening mammogram for malignant neoplasm of breast: Secondary | ICD-10-CM

## 2018-06-23 ENCOUNTER — Ambulatory Visit
Admission: RE | Admit: 2018-06-23 | Discharge: 2018-06-23 | Disposition: A | Discharge status: Home / Self Care | Payer: Medicare Other | Source: Ambulatory Visit | Attending: Nurse Practitioner | Admitting: Nurse Practitioner

## 2018-06-23 DIAGNOSIS — Z1231 Encounter for screening mammogram for malignant neoplasm of breast: Secondary | ICD-10-CM | POA: Diagnosis present

## 2019-07-05 ENCOUNTER — Other Ambulatory Visit: Payer: Self-pay | Admitting: Family Medicine

## 2019-07-05 DIAGNOSIS — Z1231 Encounter for screening mammogram for malignant neoplasm of breast: Secondary | ICD-10-CM

## 2019-07-28 ENCOUNTER — Ambulatory Visit
Admission: RE | Admit: 2019-07-28 | Discharge: 2019-07-28 | Disposition: A | Discharge status: Home / Self Care | Payer: Medicare Other | Source: Ambulatory Visit | Attending: Family Medicine | Admitting: Family Medicine

## 2019-07-28 DIAGNOSIS — Z1231 Encounter for screening mammogram for malignant neoplasm of breast: Secondary | ICD-10-CM | POA: Insufficient documentation

## 2019-12-01 ENCOUNTER — Emergency Department: Payer: Medicare Other

## 2019-12-01 ENCOUNTER — Other Ambulatory Visit: Payer: Self-pay

## 2019-12-01 ENCOUNTER — Emergency Department: Payer: Medicare Other | Attending: Emergency Medicine

## 2019-12-01 ENCOUNTER — Encounter: Payer: Self-pay | Admitting: *Deleted

## 2019-12-01 ENCOUNTER — Emergency Department: Admission: EM | Admit: 2019-12-01 | Payer: Medicare Other | Source: Home / Self Care

## 2019-12-01 ENCOUNTER — Inpatient Hospital Stay
Admission: EM | Admit: 2019-12-01 | Discharge: 2019-12-03 | DRG: 190 | Disposition: A | Discharge status: Home / Self Care | Payer: Medicare Other | Attending: Internal Medicine | Admitting: Internal Medicine

## 2019-12-01 DIAGNOSIS — Z7982 Long term (current) use of aspirin: Secondary | ICD-10-CM | POA: Diagnosis not present

## 2019-12-01 DIAGNOSIS — Z8249 Family history of ischemic heart disease and other diseases of the circulatory system: Secondary | ICD-10-CM | POA: Diagnosis not present

## 2019-12-01 DIAGNOSIS — R05 Cough: Secondary | ICD-10-CM | POA: Insufficient documentation

## 2019-12-01 DIAGNOSIS — J449 Chronic obstructive pulmonary disease, unspecified: Secondary | ICD-10-CM

## 2019-12-01 DIAGNOSIS — Z8349 Family history of other endocrine, nutritional and metabolic diseases: Secondary | ICD-10-CM

## 2019-12-01 DIAGNOSIS — Z23 Encounter for immunization: Secondary | ICD-10-CM | POA: Diagnosis present

## 2019-12-01 DIAGNOSIS — J441 Chronic obstructive pulmonary disease with (acute) exacerbation: Secondary | ICD-10-CM | POA: Diagnosis present

## 2019-12-01 DIAGNOSIS — N1832 Chronic kidney disease, stage 3b: Secondary | ICD-10-CM | POA: Diagnosis present

## 2019-12-01 DIAGNOSIS — N1831 Chronic kidney disease, stage 3a: Secondary | ICD-10-CM | POA: Diagnosis not present

## 2019-12-01 DIAGNOSIS — Z79890 Hormone replacement therapy (postmenopausal): Secondary | ICD-10-CM

## 2019-12-01 DIAGNOSIS — J80 Acute respiratory distress syndrome: Secondary | ICD-10-CM | POA: Diagnosis present

## 2019-12-01 DIAGNOSIS — E039 Hypothyroidism, unspecified: Secondary | ICD-10-CM | POA: Diagnosis present

## 2019-12-01 DIAGNOSIS — J209 Acute bronchitis, unspecified: Secondary | ICD-10-CM

## 2019-12-01 DIAGNOSIS — E785 Hyperlipidemia, unspecified: Secondary | ICD-10-CM | POA: Diagnosis present

## 2019-12-01 DIAGNOSIS — Z79899 Other long term (current) drug therapy: Secondary | ICD-10-CM

## 2019-12-01 DIAGNOSIS — G8929 Other chronic pain: Secondary | ICD-10-CM

## 2019-12-01 DIAGNOSIS — I739 Peripheral vascular disease, unspecified: Secondary | ICD-10-CM | POA: Diagnosis present

## 2019-12-01 DIAGNOSIS — J9602 Acute respiratory failure with hypercapnia: Secondary | ICD-10-CM

## 2019-12-01 DIAGNOSIS — I129 Hypertensive chronic kidney disease with stage 1 through stage 4 chronic kidney disease, or unspecified chronic kidney disease: Secondary | ICD-10-CM | POA: Diagnosis present

## 2019-12-01 DIAGNOSIS — Z833 Family history of diabetes mellitus: Secondary | ICD-10-CM | POA: Diagnosis not present

## 2019-12-01 DIAGNOSIS — J440 Chronic obstructive pulmonary disease with acute lower respiratory infection: Secondary | ICD-10-CM | POA: Diagnosis present

## 2019-12-01 DIAGNOSIS — Z87891 Personal history of nicotine dependence: Secondary | ICD-10-CM

## 2019-12-01 DIAGNOSIS — I10 Essential (primary) hypertension: Secondary | ICD-10-CM | POA: Diagnosis not present

## 2019-12-01 DIAGNOSIS — J9622 Acute and chronic respiratory failure with hypercapnia: Secondary | ICD-10-CM

## 2019-12-01 DIAGNOSIS — Z20822 Contact with and (suspected) exposure to covid-19: Secondary | ICD-10-CM | POA: Diagnosis present

## 2019-12-01 DIAGNOSIS — J9601 Acute respiratory failure with hypoxia: Secondary | ICD-10-CM | POA: Diagnosis present

## 2019-12-01 DIAGNOSIS — J9620 Acute and chronic respiratory failure, unspecified whether with hypoxia or hypercapnia: Secondary | ICD-10-CM

## 2019-12-01 DIAGNOSIS — Z8673 Personal history of transient ischemic attack (TIA), and cerebral infarction without residual deficits: Secondary | ICD-10-CM

## 2019-12-01 DIAGNOSIS — R0602 Shortness of breath: Secondary | ICD-10-CM

## 2019-12-01 DIAGNOSIS — J9621 Acute and chronic respiratory failure with hypoxia: Secondary | ICD-10-CM

## 2019-12-01 HISTORY — DX: Bronchitis, not specified as acute or chronic: J40

## 2019-12-01 HISTORY — DX: Essential (primary) hypertension: I10

## 2019-12-01 LAB — CBC WITH DIFFERENTIAL/PLATELET
Abs Immature Granulocytes: 0.01 10*3/uL (ref 0.00–0.07)
Basophils Absolute: 0 10*3/uL (ref 0.0–0.1)
Basophils Relative: 1 %
Eosinophils Absolute: 0.4 10*3/uL (ref 0.0–0.5)
Eosinophils Relative: 8 %
HCT: 41.9 % (ref 36.0–46.0)
Hemoglobin: 13.2 g/dL (ref 12.0–15.0)
Immature Granulocytes: 0 %
Lymphocytes Relative: 33 %
Lymphs Abs: 1.9 10*3/uL (ref 0.7–4.0)
MCH: 28.9 pg (ref 26.0–34.0)
MCHC: 31.5 g/dL (ref 30.0–36.0)
MCV: 91.9 fL (ref 80.0–100.0)
Monocytes Absolute: 0.8 10*3/uL (ref 0.1–1.0)
Monocytes Relative: 14 %
Neutro Abs: 2.6 10*3/uL (ref 1.7–7.7)
Neutrophils Relative %: 44 %
Platelets: 332 10*3/uL (ref 150–400)
RBC: 4.56 MIL/uL (ref 3.87–5.11)
RDW: 15.9 % — ABNORMAL HIGH (ref 11.5–15.5)
WBC: 5.8 10*3/uL (ref 4.0–10.5)
nRBC: 0 % (ref 0.0–0.2)

## 2019-12-01 LAB — FIBRIN DERIVATIVES D-DIMER (ARMC ONLY): Fibrin derivatives D-dimer (ARMC): 669.57 ng/mL (FEU) — ABNORMAL HIGH (ref 0.00–499.00)

## 2019-12-01 LAB — BLOOD GAS, VENOUS
Acid-Base Excess: 7.3 mmol/L — ABNORMAL HIGH (ref 0.0–2.0)
Bicarbonate: 36.4 mmol/L — ABNORMAL HIGH (ref 20.0–28.0)
FIO2: 0.32
O2 Saturation: 74.3 %
Patient temperature: 37
pCO2, Ven: 74 mmHg (ref 44.0–60.0)
pH, Ven: 7.3 (ref 7.250–7.430)
pO2, Ven: 44 mmHg (ref 32.0–45.0)

## 2019-12-01 LAB — BASIC METABOLIC PANEL
Anion gap: 9 (ref 5–15)
BUN: 21 mg/dL (ref 8–23)
CO2: 33 mmol/L — ABNORMAL HIGH (ref 22–32)
Calcium: 8.6 mg/dL — ABNORMAL LOW (ref 8.9–10.3)
Chloride: 102 mmol/L (ref 98–111)
Creatinine, Ser: 1.36 mg/dL — ABNORMAL HIGH (ref 0.44–1.00)
GFR calc Af Amer: 48 mL/min — ABNORMAL LOW (ref >60)
GFR calc non Af Amer: 41 mL/min — ABNORMAL LOW (ref >60)
Glucose, Bld: 91 mg/dL (ref 70–99)
Potassium: 4.1 mmol/L (ref 3.5–5.1)
Sodium: 144 mmol/L (ref 135–145)

## 2019-12-01 LAB — HIV ANTIBODY (ROUTINE TESTING W REFLEX): HIV Screen 4th Generation wRfx: NONREACTIVE

## 2019-12-01 LAB — SARS CORONAVIRUS 2 BY RT PCR (HOSPITAL ORDER, PERFORMED IN ~~LOC~~ HOSPITAL LAB): SARS Coronavirus 2: NEGATIVE

## 2019-12-01 LAB — BRAIN NATRIURETIC PEPTIDE: B Natriuretic Peptide: 109.8 pg/mL — ABNORMAL HIGH (ref 0.0–100.0)

## 2019-12-01 MED ORDER — ENSURE ENLIVE PO LIQD
237.0000 mL | Freq: Two times a day (BID) | ORAL | Status: DC
  Administered 2019-12-01 – 2019-12-02 (×3): 237 mL via ORAL

## 2019-12-01 MED ORDER — DIAZEPAM 2 MG PO TABS: 2.0000 mg | ORAL_TABLET | Freq: Four times a day (QID) | ORAL | Status: DC | PRN

## 2019-12-01 MED ORDER — IPRATROPIUM-ALBUTEROL 0.5-2.5 (3) MG/3ML IN SOLN
3.0000 mL | Freq: Once | RESPIRATORY_TRACT | Status: AC
  Administered 2019-12-01: 3 mL via RESPIRATORY_TRACT

## 2019-12-01 MED ORDER — AZITHROMYCIN 250 MG PO TABS
250.0000 mg | ORAL_TABLET | Freq: Every day | ORAL | Status: DC
  Administered 2019-12-02 – 2019-12-03 (×2): 250 mg via ORAL
  Filled 2019-12-01 (×2): qty 1

## 2019-12-01 MED ORDER — ENOXAPARIN SODIUM 40 MG/0.4ML ~~LOC~~ SOLN
40.0000 mg | Freq: Two times a day (BID) | SUBCUTANEOUS | Status: DC
  Administered 2019-12-01 – 2019-12-03 (×4): 40 mg via SUBCUTANEOUS
  Filled 2019-12-01 (×4): qty 0.4

## 2019-12-01 MED ORDER — GABAPENTIN 300 MG PO CAPS
300.0000 mg | ORAL_CAPSULE | Freq: Two times a day (BID) | ORAL | Status: DC
  Administered 2019-12-01 – 2019-12-03 (×4): 300 mg via ORAL
  Filled 2019-12-01 (×3): qty 1

## 2019-12-01 MED ORDER — OXYCODONE HCL ER 10 MG PO T12A
10.0000 mg | EXTENDED_RELEASE_TABLET | Freq: Two times a day (BID) | ORAL | Status: DC | PRN
  Administered 2019-12-01: 10 mg via ORAL
  Filled 2019-12-01: qty 1

## 2019-12-01 MED ORDER — METHYLPREDNISOLONE SODIUM SUCC 125 MG IJ SOLR
INTRAMUSCULAR | Status: AC
  Administered 2019-12-01: 125 mg via INTRAVENOUS
  Filled 2019-12-01: qty 2

## 2019-12-01 MED ORDER — ENOXAPARIN SODIUM 40 MG/0.4ML ~~LOC~~ SOLN: 40.0000 mg | SUBCUTANEOUS | Status: DC

## 2019-12-01 MED ORDER — PANTOPRAZOLE SODIUM 40 MG PO TBEC
40.0000 mg | DELAYED_RELEASE_TABLET | Freq: Two times a day (BID) | ORAL | Status: DC
  Administered 2019-12-01 – 2019-12-03 (×5): 40 mg via ORAL
  Filled 2019-12-01 (×5): qty 1

## 2019-12-01 MED ORDER — HYDROCHLOROTHIAZIDE 12.5 MG PO CAPS: 12.5000 mg | ORAL_CAPSULE | Freq: Every day | ORAL | Status: DC

## 2019-12-01 MED ORDER — LISINOPRIL 5 MG PO TABS
5.0000 mg | ORAL_TABLET | Freq: Every day | ORAL | Status: DC
  Administered 2019-12-01: 5 mg via ORAL
  Filled 2019-12-01: qty 1

## 2019-12-01 MED ORDER — CEFTRIAXONE SODIUM 1 G IJ SOLR
1.0000 g | Freq: Once | INTRAMUSCULAR | Status: AC
  Administered 2019-12-01: 1 g via INTRAVENOUS
  Filled 2019-12-01: qty 10

## 2019-12-01 MED ORDER — PRAVASTATIN SODIUM 20 MG PO TABS
20.0000 mg | ORAL_TABLET | Freq: Every day | ORAL | Status: DC
  Administered 2019-12-01 – 2019-12-03 (×3): 20 mg via ORAL
  Filled 2019-12-01 (×3): qty 1

## 2019-12-01 MED ORDER — IPRATROPIUM-ALBUTEROL 0.5-2.5 (3) MG/3ML IN SOLN
3.0000 mL | Freq: Once | RESPIRATORY_TRACT | Status: DC
  Administered 2019-12-01: 3 mL via RESPIRATORY_TRACT

## 2019-12-01 MED ORDER — LEVOTHYROXINE SODIUM 50 MCG PO TABS
50.0000 ug | ORAL_TABLET | Freq: Every day | ORAL | Status: DC
  Administered 2019-12-02 – 2019-12-03 (×2): 50 ug via ORAL
  Filled 2019-12-01 (×2): qty 1

## 2019-12-01 MED ORDER — IOHEXOL 350 MG/ML SOLN
85.0000 mL | Freq: Once | INTRAVENOUS | Status: AC | PRN
  Administered 2019-12-01: 85 mL via INTRAVENOUS

## 2019-12-01 MED ORDER — METHYLPREDNISOLONE SODIUM SUCC 125 MG IJ SOLR: 125.0000 mg | Freq: Once | INTRAMUSCULAR | Status: DC

## 2019-12-01 MED ORDER — CLONIDINE HCL 0.1 MG PO TABS
0.1000 mg | ORAL_TABLET | Freq: Two times a day (BID) | ORAL | Status: DC
  Administered 2019-12-01 (×2): 0.1 mg via ORAL
  Filled 2019-12-01 (×2): qty 1

## 2019-12-01 MED ORDER — DOCUSATE SODIUM 100 MG PO CAPS
100.0000 mg | ORAL_CAPSULE | Freq: Two times a day (BID) | ORAL | Status: DC
  Administered 2019-12-01 – 2019-12-03 (×5): 100 mg via ORAL
  Filled 2019-12-01 (×5): qty 1

## 2019-12-01 MED ORDER — LORAZEPAM 2 MG/ML IJ SOLN
1.0000 mg | Freq: Once | INTRAMUSCULAR | Status: AC
  Administered 2019-12-01: 1 mg via INTRAVENOUS

## 2019-12-01 MED ORDER — CYCLOBENZAPRINE HCL 10 MG PO TABS: 10.0000 mg | ORAL_TABLET | Freq: Three times a day (TID) | ORAL | Status: DC | PRN

## 2019-12-01 MED ORDER — IPRATROPIUM-ALBUTEROL 0.5-2.5 (3) MG/3ML IN SOLN
3.0000 mL | Freq: Four times a day (QID) | RESPIRATORY_TRACT | Status: DC
  Administered 2019-12-01 – 2019-12-02 (×5): 3 mL via RESPIRATORY_TRACT
  Filled 2019-12-01 (×4): qty 3

## 2019-12-01 MED ORDER — LORAZEPAM 2 MG/ML IJ SOLN
INTRAMUSCULAR | Status: AC
  Administered 2019-12-01: 1 mg via INTRAVENOUS
  Filled 2019-12-01: qty 1

## 2019-12-01 MED ORDER — AZITHROMYCIN 500 MG IV SOLR
500.0000 mg | Freq: Once | INTRAVENOUS | Status: AC
  Administered 2019-12-01: 500 mg via INTRAVENOUS
  Filled 2019-12-01: qty 500

## 2019-12-01 MED ORDER — METHYLPREDNISOLONE SODIUM SUCC 40 MG IJ SOLR
40.0000 mg | Freq: Two times a day (BID) | INTRAMUSCULAR | Status: DC
  Administered 2019-12-01 – 2019-12-03 (×4): 40 mg via INTRAVENOUS
  Filled 2019-12-01 (×4): qty 1

## 2019-12-01 MED ORDER — GABAPENTIN 300 MG PO CAPS
600.0000 mg | ORAL_CAPSULE | Freq: Every day | ORAL | Status: DC
  Administered 2019-12-01 – 2019-12-02 (×2): 600 mg via ORAL
  Filled 2019-12-01 (×2): qty 2

## 2019-12-01 MED ORDER — ALBUTEROL SULFATE (2.5 MG/3ML) 0.083% IN NEBU: 2.5000 mg | INHALATION_SOLUTION | RESPIRATORY_TRACT | Status: DC | PRN

## 2019-12-01 MED ORDER — IPRATROPIUM-ALBUTEROL 0.5-2.5 (3) MG/3ML IN SOLN: 3.0000 mL | Freq: Once | RESPIRATORY_TRACT | Status: DC

## 2019-12-01 MED ORDER — GABAPENTIN 300 MG PO CAPS
300.0000 mg | ORAL_CAPSULE | Freq: Three times a day (TID) | ORAL | Status: DC
  Filled 2019-12-01: qty 1

## 2019-12-01 MED ORDER — PREDNISONE 20 MG PO TABS: 40.0000 mg | ORAL_TABLET | Freq: Every day | ORAL | Status: DC

## 2019-12-01 MED ORDER — IPRATROPIUM-ALBUTEROL 0.5-2.5 (3) MG/3ML IN SOLN
RESPIRATORY_TRACT | Status: AC
  Administered 2019-12-01: 3 mL via RESPIRATORY_TRACT
  Filled 2019-12-01: qty 6

## 2019-12-01 MED ORDER — CLONIDINE HCL 0.1 MG PO TABS: 0.3000 mg | ORAL_TABLET | Freq: Two times a day (BID) | ORAL | Status: DC

## 2019-12-01 MED ORDER — CARVEDILOL 25 MG PO TABS
25.0000 mg | ORAL_TABLET | Freq: Two times a day (BID) | ORAL | Status: DC
  Administered 2019-12-01 – 2019-12-02 (×2): 25 mg via ORAL
  Filled 2019-12-01 (×3): qty 1

## 2019-12-01 MED ORDER — SUCRALFATE 1 G PO TABS
1.0000 g | ORAL_TABLET | Freq: Two times a day (BID) | ORAL | Status: DC
  Administered 2019-12-01 – 2019-12-03 (×5): 1 g via ORAL
  Filled 2019-12-01 (×5): qty 1

## 2019-12-01 MED ORDER — LORAZEPAM 2 MG/ML IJ SOLN: 1.0000 mg | Freq: Once | INTRAMUSCULAR | Status: DC

## 2019-12-01 MED ORDER — OXYCODONE HCL ER 10 MG PO T12A
10.0000 mg | EXTENDED_RELEASE_TABLET | Freq: Two times a day (BID) | ORAL | Status: DC
  Administered 2019-12-01 – 2019-12-03 (×4): 10 mg via ORAL
  Filled 2019-12-01 (×5): qty 1

## 2019-12-01 MED ORDER — AMLODIPINE BESYLATE 10 MG PO TABS
10.0000 mg | ORAL_TABLET | Freq: Every day | ORAL | Status: DC
  Administered 2019-12-01 – 2019-12-03 (×3): 10 mg via ORAL
  Filled 2019-12-01 (×3): qty 1

## 2019-12-01 MED ORDER — METHYLPREDNISOLONE SODIUM SUCC 40 MG IJ SOLR
40.0000 mg | Freq: Four times a day (QID) | INTRAMUSCULAR | Status: DC
  Administered 2019-12-01: 40 mg via INTRAVENOUS
  Filled 2019-12-01: qty 1

## 2019-12-01 MED ORDER — PNEUMOCOCCAL VAC POLYVALENT 25 MCG/0.5ML IJ INJ
0.5000 mL | INJECTION | INTRAMUSCULAR | Status: AC
  Administered 2019-12-03: 0.5 mL via INTRAMUSCULAR
  Filled 2019-12-01: qty 0.5

## 2019-12-01 MED ORDER — FERROUS SULFATE 325 (65 FE) MG PO TABS
325.0000 mg | ORAL_TABLET | Freq: Three times a day (TID) | ORAL | Status: DC
  Administered 2019-12-01 – 2019-12-03 (×5): 325 mg via ORAL
  Filled 2019-12-01 (×5): qty 1

## 2019-12-01 MED ORDER — ASPIRIN EC 81 MG PO TBEC
81.0000 mg | DELAYED_RELEASE_TABLET | Freq: Every day | ORAL | Status: DC
  Administered 2019-12-01 – 2019-12-03 (×3): 81 mg via ORAL
  Filled 2019-12-01 (×3): qty 1

## 2019-12-01 MED ORDER — SODIUM CHLORIDE 0.9 % IV SOLN: INTRAVENOUS | Status: DC

## 2019-12-01 MED ORDER — ZOLPIDEM TARTRATE 5 MG PO TABS: 5.0000 mg | ORAL_TABLET | Freq: Every evening | ORAL | Status: DC | PRN

## 2019-12-01 MED ORDER — HYDRALAZINE HCL 50 MG PO TABS
50.0000 mg | ORAL_TABLET | Freq: Three times a day (TID) | ORAL | Status: DC
  Administered 2019-12-01 – 2019-12-03 (×6): 50 mg via ORAL
  Filled 2019-12-01 (×6): qty 1

## 2019-12-01 MED ORDER — METHYLPREDNISOLONE SODIUM SUCC 125 MG IJ SOLR
125.0000 mg | Freq: Once | INTRAMUSCULAR | Status: AC
  Administered 2019-12-01: 125 mg via INTRAVENOUS

## 2019-12-01 NOTE — H&P (Addendum)
History and Physical    Kimberly Mccarthy QMV:[id number] DOB: Mar 27, 1956 DOA: 12/01/2019  PCP: [facility]   Patient coming from: Home  I have personally briefly reviewed patient's old medical records in [facility]  Chief Complaint: Shortness of breath  HPI: Kimberly Mccarthy is a 64 y.o. female with medical history significant for HTN, CKD 3B, hypothyroidism, recurrent bronchitis, who presents to the emergency room by EMS with shortness of breath.  On arrival of EMS patient was in acute respiratory distress with O2 sat in the mid 80s.  They attempted to place on CPAP but she could not tolerate it, arriving on nonrebreather.  History limited due to clinical condition.  Sister gives some of the history stating that patient has had frequent bouts of bronchitis.  Patient herself denies history of COPD.  Patient received duo nebs in route.  States she tested negative for COVID-19 the day prior ED Course: On arrival she was tachypneic at 58 with increased work of breathing.  O2 sat high 90s on NRB.  She was transitioned to BiPAP with improvement.  Was treated with several duo nebs and IV Solu-Medrol with symptomatic improvement.  Blood work was mostly unremarkable, Covid PCR negative, D-dimer slightly elevated and she had a CTA chest that was negative for acute PE or acute pulmonary disease.  Hospitalist consulted for admission.  Review of Systems: Limited due to patient's clinical condition   Past Medical History:  Diagnosis Date  . Accelerated hypertension 09/05/2014  . Anemia   . Anemia   . Arterial disease (HCC) 01/11/2014  . AVM (arteriovenous malformation)   . Carotid artery disease (HCC) 10/31/2015  . Carotid artery stenosis   . Critical ischemia of lower extremity 07/18/2014  . H/O blood clots   . Hyperkalemia   . Hypertension   . Hypothyroidism 09/05/2014  . PVD (peripheral vascular disease) (HCC)   . Renal failure   . Stroke [facility])    may 2015  . Thyroid  disease     Past Surgical History:  Procedure Laterality Date  . FEMORAL ENDARTERECTOMY Right   . left foot surgery  02/2014  . LEG SURGERY Left      reports that she has quit smoking. Her smoking use included cigarettes. She has a 6.60 pack-year smoking history. She has never used smokeless tobacco. She reports that she does not drink alcohol and does not use drugs.  No Known Allergies  Family History  Problem Relation Age of Onset  . Diabetes Mother   . Hypertension Mother   . Hypertension Sister   . Thyroid disease Sister   . Breast cancer Neg Hx       Prior to Admission medications   Medication Sig Start Date End Date Taking? Authorizing Provider  amLODipine (NORVASC) 10 MG tablet Take 10 mg by mouth daily.    [provider]  aspirin EC 81 MG tablet Take 81 mg by mouth daily.    [provider]  carvedilol (COREG) 25 MG tablet Take 25 mg by mouth 2 (two) times daily with a meal.    [provider]  cloNIDine (CATAPRES) 0.3 MG tablet Take 0.3 mg by mouth 2 (two) times daily.    [provider]  diazepam (VALIUM) 2 MG tablet Take 1 tablet (2 mg total) by mouth every 6 (six) hours as needed for anxiety. 08/09/17   Fisher, Roselyn Bering, PA-C  docusate sodium (COLACE) 100 MG capsule Take 100 mg by mouth 2 (two)  times daily.    [provider]  Ferrous Gluconate 324 (37.5 Fe) MG TABS Take by mouth.    [provider]  ferrous sulfate 325 (65 FE) MG tablet Take 325 mg by mouth 3 (three) times daily with meals.     [provider]  gabapentin (NEURONTIN) 300 MG capsule Take 300 mg by mouth 3 (three) times daily.    [provider]  hydrALAZINE (APRESOLINE) 50 MG tablet Take 50 mg by mouth 3 (three) times daily. 05/01/15   [provider]  hydrochlorothiazide (MICROZIDE) 12.5 MG capsule Take 12.5 mg by mouth daily.    [provider]  levothyroxine (SYNTHROID, LEVOTHROID) 50 MCG tablet Take 50 mcg by  mouth daily.    [provider]  lisinopril (PRINIVIL,ZESTRIL) 40 MG tablet Take by mouth.    [provider]  Multiple Vitamin (MULTIVITAMIN WITH MINERALS) TABS tablet Take 1 tablet by mouth daily.    [provider]  pantoprazole (PROTONIX) 40 MG tablet Take 40 mg by mouth 2 (two) times daily.    [provider]  pravastatin (PRAVACHOL) 20 MG tablet Take 20 mg by mouth daily. 05/01/15   [provider]  predniSONE (STERAPRED UNI-PAK 21 TAB) 10 MG (21) TBPK tablet Take 6 pills on day one then decrease by 1 pill each day 08/09/17   Bartholomew BoardsFisher, Susan W, PA-C  [facility] HFA 108 [phone](90 Base) MCG/ACT inhaler  10/11/15   [provider]  sucralfate (CARAFATE) 1 g tablet Take by mouth.    [provider]    Physical Exam: Vitals:   12/01/19 0102 12/01/19 0200 12/01/19 0230 12/01/19 0315  BP: (!) 158/94 (!) 158/76 (!) 182/102 (!) 127/51  Pulse: 79 (!) 58 63 64  Resp: 16 (!) 22 17 (!) 21  Temp: 98.3 F (36.8 C)     TempSrc: Oral     SpO2: 100% 96% 99% 95%     Vitals:   12/01/19 0102 12/01/19 0200 12/01/19 0230 12/01/19 0315  BP: (!) 158/94 (!) 158/76 (!) 182/102 (!) 127/51  Pulse: 79 (!) 58 63 64  Resp: 16 (!) 22 17 (!) 21  Temp: 98.3 F (36.8 C)     TempSrc: Oral     SpO2: 100% 96% 99% 95%      Constitutional:  Patient is on BiPAP, increased work of breathing  HEENT:      Head: Normocephalic and atraumatic.         Eyes: PERLA, EOMI, Conjunctivae are normal. Sclera is non-icteric.       Mouth/Throat: Mucous membranes are moist.       Neck: Supple with no signs of meningismus. Cardiovascular: Regular rate and rhythm. No murmurs, gallops, or rubs. 2+ symmetrical distal pulses are present . No JVD. No LE edema Respiratory: Respiratory effort increased.Lungs sounds diminished with scattered rhonchi gastrointestinal: Soft, non tender, and non distended with positive bowel sounds. No rebound or guarding. Genitourinary: No CVA  tenderness. Musculoskeletal: Nontender with normal range of motion in all extremities. No cyanosis, or erythema of extremities. Neurologic: Normal speech and language. Face is symmetric. Moving all extremities. No gross focal neurologic deficits . Skin: Skin is warm, dry.  No rash or ulcers Psychiatric: Mood and affect are normal Speech and behavior are normal   Labs on Admission: I have personally reviewed following labs and imaging studies  CBC: Recent Labs  Lab 12/01/19 0053  WBC 5.8  NEUTROABS 2.6  HGB 13.2  HCT 41.9  MCV 91.9  PLT  332   Basic Metabolic Panel: Recent Labs  Lab 12/01/19 0053  NA 144  K 4.1  CL 102  CO2 33*  GLUCOSE 91  BUN 21  CREATININE 1.36*  CALCIUM 8.6*   GFR: CrCl cannot be calculated (Unknown ideal weight.). Liver Function Tests: No results for input(s): AST, ALT, ALKPHOS, BILITOT, PROT, ALBUMIN in the last 168 hours. No results for input(s): LIPASE, AMYLASE in the last 168 hours. No results for input(s): AMMONIA in the last 168 hours. Coagulation Profile: No results for input(s): INR, PROTIME in the last 168 hours. Cardiac Enzymes: No results for input(s): CKTOTAL, CKMB, CKMBINDEX, TROPONINI in the last 168 hours. BNP (last 3 results) No results for input(s): PROBNP in the last 8760 hours. HbA1C: No results for input(s): HGBA1C in the last 72 hours. CBG: No results for input(s): GLUCAP in the last 168 hours. Lipid Profile: No results for input(s): CHOL, HDL, LDLCALC, TRIG, CHOLHDL, LDLDIRECT in the last 72 hours. Thyroid Function Tests: No results for input(s): TSH, T4TOTAL, FREET4, T3FREE, THYROIDAB in the last 72 hours. Anemia Panel: No results for input(s): VITAMINB12, FOLATE, FERRITIN, TIBC, IRON, RETICCTPCT in the last 72 hours. Urine analysis:    Component Value Date/Time   COLORURINE YELLOW (A) 05/09/2015 1051   APPEARANCEUR CLEAR (A) 05/09/2015 1051   APPEARANCEUR Clear 03/15/2014 1416   LABSPEC 1.008 05/09/2015 1051    LABSPEC 1.010 03/15/2014 1416   PHURINE 7.0 05/09/2015 1051   GLUCOSEU NEGATIVE 05/09/2015 1051   GLUCOSEU Negative 03/15/2014 1416   HGBUR NEGATIVE 05/09/2015 1051   BILIRUBINUR NEGATIVE 05/09/2015 1051   BILIRUBINUR Negative 03/15/2014 1416   KETONESUR NEGATIVE 05/09/2015 1051   PROTEINUR NEGATIVE 05/09/2015 1051   NITRITE NEGATIVE 05/09/2015 1051   LEUKOCYTESUR NEGATIVE 05/09/2015 1051   LEUKOCYTESUR Trace 03/15/2014 1416    Radiological Exams on Admission: CT Angio Chest PE W and/or Wo Contrast  Result Date: 12/01/2019 CLINICAL DATA:  High probability is suspected pulmonary embolus. Shortness of breath for a week, worsening today. EXAM: CT ANGIOGRAPHY CHEST WITH CONTRAST TECHNIQUE: Multidetector CT imaging of the chest was performed using the standard protocol during bolus administration of intravenous contrast. Multiplanar CT image reconstructions and MIPs were obtained to evaluate the vascular anatomy. CONTRAST:  20mL OMNIPAQUE IOHEXOL 350 MG/ML SOLN COMPARISON:  05/09/2015 FINDINGS: Cardiovascular: Motion artifact limits evaluation. There is good opacification of the central and segmental pulmonary arteries. No focal filling defects. No evidence of significant pulmonary embolus. Normal heart size. No pericardial effusion. Normal caliber thoracic aorta. No evidence of aortic dissection. Aortic calcification. Coronary artery calcification. Venous gas likely results from intravenous injection. Mediastinum/Nodes: No significant lymphadenopathy. Esophagus is decompressed. Lungs/Pleura: Motion artifact limits evaluation. No obvious edema or consolidation suggested. No pleural effusions. Upper Abdomen: No acute abnormality. Musculoskeletal: No chest wall abnormality. No acute or significant osseous findings. Review of the MIP images confirms the above findings. IMPRESSION: 1. No evidence of significant pulmonary embolus. 2. No evidence of active pulmonary disease. 3. Aortic atherosclerosis. Aortic  Atherosclerosis (ICD10-I70.0). Electronically Signed   By: Burman Nieves M.D.   On: 12/01/2019 03:02   DG Chest Port 1 View  Result Date: 12/01/2019 CLINICAL DATA:  Shortness of breath EXAM: PORTABLE CHEST 1 VIEW COMPARISON:  12/02/2014 FINDINGS: Asymmetric hazy opacity over the right thorax. The left lung is clear. Normal cardiomediastinal silhouette. No pneumothorax. Rounded lucency with peripheral rim overlying the right apex likely related to external support device. IMPRESSION: Asymmetric hazy opacity over the right thorax, some of which is likely due  to overlying soft tissue however unable to exclude underlying airspace disease such as pneumonia or asymmetric edema. Electronically Signed   By: Jasmine Pang M.D.   On: 12/01/2019 01:35    EKG: Independently reviewed. Interpretation : Sinus rhythm with no acute ST-T wave changes  Assessment/Plan 64 year old female with history of HTN, CKD 3B, hypothyroidism, admitted with acute respiratory distress suspect COPD with acute exacerbation.  Covid negative, CTA chest unremarkable    Acute respiratory failure with hypoxia and hypercapnia (HCC)   COPD with acute bronchitis (HCC) -Patient presented with shortness of breath that worsened progressively during the course of the day, O2 sat mid 80s with EMS, requiring NIPPV VBG with PCO2 74 mmHg -CTA chest with no PE or acute pulmonary process and Covid PCR negative -Suspect acute bronchitis with underlying undiagnosed COPD -DuoNebs every 6 and as needed -IV steroids -Supplemental oxygen to keep sats over 92% -Outpatient referral to pulmonology    Hypothyroidism -Continue levothyroxine pending med rec    HTN (hypertension), -Resume home meds    Chronic kidney disease, stage 3b -Renal function at baseline    DVT prophylaxis: Lovenox  Code Status: full code  Family Communication:  none  Disposition Plan: Back to previous home environment Consults called: none  Status:  Observation    Andris Baumann MD [facility]     12/01/2019, 3:45 AM

## 2019-12-01 NOTE — Evaluation (Signed)
Occupational Therapy Evaluation Patient Details Name: Kimberly Mccarthy MRN: [id number] DOB: [birth year] Today's Date: 12/01/2019    History of Present Illness Kimberly Mccarthy is a 64 y.o. female with medical history significant for HTN, CKD 3B, hypothyroidism, recurrent bronchitis, who presents to the emergency room by EMS with shortness of breath.  On arrival of EMS patient was in acute respiratory distress with O2 sat in the mid 80s.     Clinical Impression   Kimberly Mccarthy was seen for OT evaluation this date. Prior to hospital admission, pt was MOD I for mobility using 4WW. Pt lives c sister who pt reports is her aid and assists c IADLs and bathing. Pt currently lives in [facility] c [address], but reports plans to move into handicap accessible apartment this week. Pt presents to acute OT demonstrating impaired ADL performance and functional mobility 2/2 decreased activity tolerance and functional balance/strength deficits. Pt currently requires SBA + RW for ADL t/f and standing grooming tasks - SpO2 97% on 2L [place] in sitting, stable at 94% in standing on 2L, desat to 89% on RA (O2 removed for face washing ~2 mins). SBA don/doff socks seated EOB. Pt would benefit from skilled OT to address noted impairments and functional limitations (see below for any additional details) in order to maximize safety and independence while minimizing falls risk and caregiver burden. Upon hospital discharge, recommend No OT follow up - pt independently verbalized process for accessing HHOT should the need arise.     Follow Up Recommendations  Supervision/Assistance - 24 hour;No OT follow up    Equipment Recommendations  Tub/shower seat    Recommendations for Other Services       Precautions / Restrictions Precautions Precautions: Fall Restrictions Weight Bearing Restrictions: No      Mobility Bed Mobility Overal bed mobility: Modified Independent             General bed mobility comments: HoB elevated and  increased time.   Transfers Overall transfer level: Needs assistance Equipment used: Rolling walker (2 wheeled) Transfers: Sit to/from Stand Sit to Stand: Supervision              Balance Overall balance assessment: Needs assistance Sitting-balance support: No upper extremity supported;Feet supported Sitting balance-Leahy Scale: Good     Standing balance support: Bilateral upper extremity supported;During functional activity Standing balance-Leahy Scale: Good Standing balance comment: support on forearms during 2 handed grooming tasks        ADL either performed or assessed with clinical judgement   ADL Overall ADL's : Needs assistance/impaired    General ADL Comments: SBA + RW for ADL t/f and standing grooming tasks - SpO2 97% on 2L [place] in sitting, stable at 94% in standing on 2L [place], desat to 89% on RA (O2 removed for face washing ~2 mins). SBA don/doff socks seated EOB.       Pertinent Vitals/Pain Pain Assessment: No/denies pain     Hand Dominance Right   Extremity/Trunk Assessment Upper Extremity Assessment Upper Extremity Assessment: Overall WFL for tasks assessed   Lower Extremity Assessment Lower Extremity Assessment: Generalized weakness       Communication Communication Communication: No difficulties   Cognition Arousal/Alertness: Awake/alert Behavior During Therapy: WFL for tasks assessed/performed Overall Cognitive Status: Within Functional Limits for tasks assessed      General Comments       Exercises Exercises: Other exercises Other Exercises Other Exercises: Pt educated re: OT role, DME recs, d/c recs, ECS, falls prevention, home/routines modifications Other  Exercises: Face washing, tooth brushing, LBD, sup<>sit, sit<>stand, sitting/standing balance/tolerance, self-drinking, functional reach inside BOS   Shoulder Instructions      Home Living Family/patient expects to be discharged to:: Private residence Living Arrangements: Other  relatives (sister) Available Help at Discharge: Family;Available 24 hours/day Type of Home: House Home Access: Stairs to enter [employer] of Steps: 1   Home Layout: One level     Bathroom Shower/Tub: Tub/shower unit         Home Equipment: Environmental consultant - 4 wheels   Additional Comments: Pt reports plans to move in to handicap accessible apartment this week       Prior Functioning/Environment Level of Independence: Needs assistance  Gait / Transfers Assistance Needed: MOD I c 4WW  ADL's / Homemaking Assistance Needed: Assist from sister for IADLs and bathing             OT Problem List: Decreased strength;Decreased activity tolerance;Impaired balance (sitting and/or standing)      OT Treatment/Interventions: Self-care/ADL training;Therapeutic exercise;Energy conservation;DME and/or AE instruction;Therapeutic activities;Patient/family education;Balance training    OT Goals(Current goals can be found in the care plan section) Acute Rehab OT Goals Patient Stated Goal: To return home  OT Goal Formulation: With patient Time For Goal Achievement: 12/15/19 Potential to Achieve Goals: Good ADL Goals Pt Will Perform Grooming: with modified independence;standing (c LRAD PRN) Pt Will Perform Lower Body Dressing: with modified independence;sit to/from stand (c LRAD PRN) Pt Will Transfer to Toilet: with modified independence;ambulating;regular height toilet (c LRAD PRN) Additional ADL Goal #1: Pt will Independently verbalize plan to implement x3 ECS  OT Frequency: Min 1X/week   Barriers to D/C: Inaccessible home environment          Co-evaluation              AM-PAC OT "6 Clicks" Daily Activity     Outcome Measure Help from another person eating meals?: None Help from another person taking care of personal grooming?: A Little Help from another person toileting, which includes using toliet, bedpan, or urinal?: A Little Help from another person bathing (including  washing, rinsing, drying)?: A Little Help from another person to put on and taking off regular upper body clothing?: None Help from another person to put on and taking off regular lower body clothing?: A Little 6 Click Score: 20   End of Session Equipment Utilized During Treatment: Rolling walker;Oxygen Nurse Communication: Mobility status  Activity Tolerance: Patient tolerated treatment well Patient left: in bed;with call bell/phone within reach;with bed alarm set  OT Visit Diagnosis: Muscle weakness (generalized) (M62.81);Other abnormalities of gait and mobility (R26.89)                Time: 3254-9826 OT Time Calculation (min): 26 min Charges:  OT General Charges $OT Visit: 1 Visit OT Evaluation $OT Eval Low Complexity: 1 Low OT Treatments $Self Care/Home Management : 23-37 mins  Kathie Dike, M.S. OTR/L  12/01/19, 2:38 PM  ascom [phone]

## 2019-12-01 NOTE — ED Provider Notes (Signed)
[facility] Emergency Department Provider Note  ____________________________________________   First MD Initiated Contact with Patient 12/01/19 [phone]     (approximate)  I have reviewed the triage vital signs and the nursing notes.  Patient was registered erroneously.  This was corrected after the patient's note was partially completed.  The patient's name is actually [employer].    HISTORY  Chief Complaint Respiratory Distress   HPI Rashiya Lofland is a 64 y.o. female presents to the emergency department via EMS secondary to progressive dyspnea over the course of today with associated cough.  Patient does admit to sick contact family member with a "cold".  EMS states on arrival patient with apparent respiratory distress and as such CPAP was applied however patient could not tolerate it for more than 10 min.  As such EMS applied a nonrebreather.  Patient presents to the emergency department in respiratory distress.        No past medical history on file.  There are no problems to display for this patient.     Prior to Admission medications   Not on File    Allergies Patient has no allergy information on record.  No family history on file.  Social History Social History   Tobacco Use  . Smoking status: Not on file  Substance Use Topics  . Alcohol use: Not on file  . Drug use: Not on file    Review of Systems Constitutional: No fever/chills Eyes: No visual changes. ENT: No sore throat. Cardiovascular: Denies chest pain. Respiratory: Positive for dyspnea and cough Gastrointestinal: No abdominal pain.  No nausea, no vomiting.  No diarrhea.  No constipation. Genitourinary: Negative for dysuria. Musculoskeletal: Negative for neck pain.  Negative for back pain. Integumentary: Negative for rash. Neurological: Negative for headaches, focal weakness or numbness.   ____________________________________________   PHYSICAL EXAM:  VITAL  SIGNS: ED Triage Vitals [12/01/19 0036]  Enc Vitals Group     BP      Pulse Rate 72     Resp      Temp      Temp src      SpO2 100 %     Weight      Height      Head Circumference      Peak Flow      Pain Score 0     Pain Loc      Pain Edu?      Excl. in GC?     Constitutional: Alert and oriented.  Apparent respiratory distress Eyes: Conjunctivae are normal.  Head: Atraumatic. Mouth/Throat: Patient is wearing a mask. Neck: No stridor.  No meningeal signs.   Cardiovascular: Normal rate, regular rhythm. Good peripheral circulation. Grossly normal heart sounds. Respiratory: Tachypnea, positive accessory respiratory muscle use, diffuse rhonchi and expiratory wheezing Gastrointestinal: Soft and nontender. No distention.   Musculoskeletal: No lower extremity tenderness nor edema. No gross deformities of extremities. Neurologic:  Normal speech and language. No gross focal neurologic deficits are appreciated.  Skin:  Skin is warm, dry and intact. Psychiatric: Mood and affect are normal. Speech and behavior are normal.  ____________________________________________   LABS (all labs ordered are listed, but only abnormal results are displayed)  Labs Reviewed  CBC WITH DIFFERENTIAL/PLATELET  BASIC METABOLIC PANEL  BRAIN NATRIURETIC PEPTIDE  FIBRIN DERIVATIVES D-DIMER ([facility] ONLY)   ____________________________________________   Procedures   ____________________________________________   INITIAL IMPRESSION / MDM / ASSESSMENT AND PLAN / ED COURSE  As part of my medical  decision making, I reviewed the following data within the electronic MEDICAL RECORD NUMBER  ____________________________________________  FINAL CLINICAL IMPRESSION(S) / ED DIAGNOSES  Final diagnoses:  None     MEDICATIONS GIVEN DURING THIS VISIT:  Medications  LORazepam (ATIVAN) injection 1 mg (has no administration in time range)  ipratropium-albuterol (DUONEB) 0.5-2.5 (3) MG/3ML nebulizer solution 3 mL  (has no administration in time range)  ipratropium-albuterol (DUONEB) 0.5-2.5 (3) MG/3ML nebulizer solution 3 mL (has no administration in time range)  methylPREDNISolone sodium succinate (SOLU-MEDROL) 125 mg/2 mL injection 125 mg (has no administration in time range)     ED Discharge Orders    None      *Please note:  Labrisha Wuellner was evaluated in Emergency Department on 12/01/2019 for the symptoms described in the history of present illness. She was evaluated in the context of the global COVID-19 pandemic, which necessitated consideration that the patient might be at risk for infection with the SARS-CoV-2 virus that causes COVID-19. Institutional protocols and algorithms that pertain to the evaluation of patients at risk for COVID-19 are in a state of rapid change based on information released by regulatory bodies including the CDC and federal and state organizations. These policies and algorithms were followed during the patient's care in the ED.  Some ED evaluations and interventions may be delayed as a result of limited staffing during and after the pandemic.*  Note:  This document was prepared using Dragon voice recognition software and may include unintentional dictation errors.   Darci Current, MD 12/01/19 [phone]

## 2019-12-01 NOTE — ED Triage Notes (Signed)
PT toED via [facility] from home c/o [facility] for about a week, worsening today. Room air sats on 80's when EMS arrived. EMS attempted to put pt on CPAP but pt could not tolerate, arrived on NRB at 15L with increased WOB. PT can only speak a few words before losing breath.

## 2019-12-01 NOTE — Evaluation (Addendum)
Physical Therapy Evaluation Patient Details Name: Kimberly Mccarthy MRN: [id number] DOB: [birth year] Today's Date: 12/01/2019   History of Present Illness  65 y.o. female with medical history significant for HTN, CKD 3B, hypothyroidism, recurrent bronchitis, who presents to the emergency room by EMS with shortness of breath.  On arrival of EMS patient was in acute respiratory distress with O2 sat in the mid 80s.    Clinical Impression  Pt eager to do some ambulation and actually did very well.  On arrival she was on 2L O2 with sats in the high 90s, removed O2 to room air and at rest O2 remained in the mid/high 90s, stayed on room air t/o ambulation with sats never dropping below 90% and no real shortness of breath or excessive fatigue. Did place back on 2L and reported to nurse about O2 sats with activity on room air. She reports feeling good about being able to return home once medically cleared and agrees that she does not need further PT. Will complete PT orders, no further needs.    Follow Up Recommendations No PT follow up    Equipment Recommendations  None recommended by PT    Recommendations for Other Services       Precautions / Restrictions Precautions Precautions: Fall Restrictions Weight Bearing Restrictions: No      Mobility  Bed Mobility Overal bed mobility: Modified Independent             General bed mobility comments: able to rise easily  w/o assist  Transfers Overall transfer level: Needs assistance Equipment used: Rolling walker (2 wheeled) Transfers: Sit to/from Stand Sit to Stand: Modified independent (Device/Increase time)         General transfer comment: able to rise without assist or unsteadiness  Ambulation/Gait Ambulation/Gait assistance: Modified independent (Device/Increase time) Gait Distance (Feet): 250 Feet Assistive device: Rolling walker (2 wheeled)       General Gait Details: Pt was able to maintain consistent cadence with  prolonged bout of ambulation.  She reports feeling slighly sloer than baseline, but ultimatley did well.  Maintained sats in the low 90s to/ the effort on room air.  Pt with only minimal fatigue with prolonged walk.  Stairs            Wheelchair Mobility    Modified Rankin (Stroke Patients Only)       Balance Overall balance assessment: Modified Independent Sitting-balance support: No upper extremity supported;Feet supported Sitting balance-Leahy Scale: Good     Standing balance support: Bilateral upper extremity supported;During functional activity Standing balance-Leahy Scale: Good Standing balance comment: support on forearms during 2 handed grooming tasks                             Pertinent Vitals/Pain Pain Assessment: No/denies pain    Home Living Family/patient expects to be discharged to:: Private residence Living Arrangements: Other relatives (sister) Available Help at Discharge: Family;Available 24 hours/day Type of Home: House Home Access: Stairs to enter   [employer] of Steps: 1 Home Layout: One level Home Equipment: Walker - 4 wheels Additional Comments: Pt reports plans to move in to handicap accessible apartment this week     Prior Function Level of Independence: Needs assistance   Gait / Transfers Assistance Needed: MOD I c 4WW   ADL's / Homemaking Assistance Needed: Assist from sister for IADLs and bathing         Hand Dominance   Dominant Hand:  Right    Extremity/Trunk Assessment   Upper Extremity Assessment Upper Extremity Assessment: Overall WFL for tasks assessed    Lower Extremity Assessment Lower Extremity Assessment: Generalized weakness       Communication   Communication: No difficulties  Cognition Arousal/Alertness: Awake/alert Behavior During Therapy: WFL for tasks assessed/performed Overall Cognitive Status: Within Functional Limits for tasks assessed                                         General Comments          Assessment/Plan    PT Assessment Patent does not need any further PT services  PT Problem List         PT Treatment Interventions      PT Goals (Current goals can be found in the Care Plan section)  Acute Rehab PT Goals Patient Stated Goal: To return home  PT Goal Formulation: With patient Time For Goal Achievement: 12/15/19 Potential to Achieve Goals: Good    Frequency     Barriers to discharge        Co-evaluation               AM-PAC PT "6 Clicks" Mobility  Outcome Measure Help needed turning from your back to your side while in a flat bed without using bedrails?: None Help needed moving from lying on your back to sitting on the side of a flat bed without using bedrails?: None Help needed moving to and from a bed to a chair (including a wheelchair)?: None Help needed standing up from a chair using your arms (e.g., wheelchair or bedside chair)?: None Help needed to walk in hospital room?: None Help needed climbing 3-5 steps with a railing? : None 6 Click Score: 24    End of Session Equipment Utilized During Treatment: Gait belt;Oxygen (removed from 2L early in session and sats remained in 90s) Activity Tolerance: Patient tolerated treatment well   Nurse Communication: Mobility status PT Visit Diagnosis: Muscle weakness (generalized) (M62.81);Difficulty in walking, not elsewhere classified (R26.2)    Time: 3382-5053 PT Time Calculation (min) (ACUTE ONLY): 34 min   Charges:   PT Evaluation $PT Eval Low Complexity: 1 Low PT Treatments $Gait Training: 8-22 mins        Malachi Pro, DPT 12/01/2019, 5:36 PM

## 2019-12-01 NOTE — ED Triage Notes (Signed)
PT toED via [facility] from home c/o [facility] for about a week, worsening today. Room air sats on 80's when EMS arrived. EMS attempted to put pt on CPAP but pt could not tolerate, arrived on NRB at 15L with increased WOB. PT can only speak a few words before losing breath.     

## 2019-12-01 NOTE — Progress Notes (Signed)
Anticoagulation monitoring(Lovenox):  64 yo female ordered Lovenox 40 mg Q24h  Filed Weights   12/01/19 1352  Weight: 126.6 kg (279 lb 3.2 oz)   Body mass index is 45.06 kg/m.   Lab Results  Component Value Date   CREATININE 1.36 (H) 12/01/2019   CREATININE 1.15 (H) 05/09/2015   CREATININE 0.94 12/03/2014   Estimated Creatinine Clearance: 57.6 mL/min (A) (by C-G formula based on SCr of 1.36 mg/dL (H)). Hemoglobin & Hematocrit     Component Value Date/Time   HGB 13.2 12/01/2019 0053   HGB 14.7 04/27/2014 0907   HCT 41.9 12/01/2019 0053   HCT 46.7 04/27/2014 0907     Per Protocol for Patient with estCrcl > 30 ml/min and BMI > 40, will transition to Lovenox 40 mg Q12h.

## 2019-12-01 NOTE — ED Notes (Signed)
Called respiratory to bring bipap, per MD Manson Passey

## 2019-12-01 NOTE — Progress Notes (Signed)
Patient ID: Kimberly Mccarthy, female   DOB: December 12, 1955, 64 y.o.   MRN: [id number] [facility] PROGRESS NOTE  BRYNLI OLLIS SHF:[id number] DOB: [birth year] DOA: 12/01/2019 PCP: [facility]  HPI/Subjective: Patient feels cold and chilly once came to the bed on the floor.  She came in with shortness of breath coughing and wheezing.  She states that recently she has been gasping for air when she is walking.  She quit smoking a long time ago.  Objective: Vitals:   12/01/19 1130 12/01/19 1302  BP: (!) 179/79 (!) 185/77  Pulse: 67 68  Resp: 19 19  Temp:  98.5 F (36.9 C)  SpO2: 96% 99%   No intake or output data in the 24 hours ending 12/01/19 1303 There were no vitals filed for this visit.  ROS: Review of Systems  Respiratory: Positive for cough, shortness of breath and wheezing.   Cardiovascular: Negative for chest pain.  Gastrointestinal: Negative for abdominal pain and nausea.   Exam: Physical Exam HENT:     Nose: No mucosal edema.     Mouth/Throat:     Pharynx: No oropharyngeal exudate.  Eyes:     General: Lids are normal.     Extraocular Movements: Extraocular movements intact.     Pupils: Pupils are equal, round, and reactive to light.  Cardiovascular:     Rate and Rhythm: Normal rate and regular rhythm.     Heart sounds: Normal heart sounds, S1 normal and S2 normal.  Pulmonary:     Breath sounds: Examination of the right-middle field reveals decreased breath sounds and wheezing. Examination of the left-middle field reveals decreased breath sounds and wheezing. Examination of the right-lower field reveals decreased breath sounds and rhonchi. Examination of the left-lower field reveals decreased breath sounds and rhonchi. Decreased breath sounds, wheezing and rhonchi present. No rales.  Abdominal:     Palpations: Abdomen is soft.     Tenderness: There is no abdominal tenderness.  Musculoskeletal:     Right ankle: Swelling present.     Left ankle:  Swelling present.  Skin:    General: Skin is warm.     Findings: No rash.  Neurological:     Mental Status: She is alert and oriented to person, place, and time.       Data Reviewed: Basic Metabolic Panel: Recent Labs  Lab 12/01/19 0053  NA 144  K 4.1  CL 102  CO2 33*  GLUCOSE 91  BUN 21  CREATININE 1.36*  CALCIUM 8.6*   CBC: Recent Labs  Lab 12/01/19 0053  WBC 5.8  NEUTROABS 2.6  HGB 13.2  HCT 41.9  MCV 91.9  PLT 332   BNP (last 3 results) Recent Labs    12/01/19 0053  BNP 109.8*     Recent Results (from the past 240 hour(s))  SARS Coronavirus 2 by RT PCR (hospital order, performed in [facility] hospital lab) Nasopharyngeal Nasopharyngeal Swab     Status: None   Collection Time: 12/01/19 12:53 AM   Specimen: Nasopharyngeal Swab  Result Value Ref Range Status   SARS Coronavirus 2 NEGATIVE NEGATIVE Final    Comment: (NOTE) SARS-CoV-2 target nucleic acids are NOT DETECTED.  The SARS-CoV-2 RNA is generally detectable in upper and lower respiratory specimens during the acute phase of infection. The lowest concentration of SARS-CoV-2 viral copies this assay can detect is 250 copies / mL. A negative result does not preclude SARS-CoV-2 infection and should not be used as the sole basis for  treatment or other patient management decisions.  A negative result may occur with improper specimen collection / handling, submission of specimen other than nasopharyngeal swab, presence of viral mutation(s) within the areas targeted by this assay, and inadequate number of viral copies (<250 copies / mL). A negative result must be combined with clinical observations, patient history, and epidemiological information.  Fact Sheet for Patients:   BoilerBrush.com.cy  Fact Sheet for Healthcare Providers: https://pope.com/  This test is not yet approved or  cleared by the [place] FDA and has been authorized for  detection and/or diagnosis of SARS-CoV-2 by FDA under an Emergency Use Authorization (EUA).  This EUA will remain in effect (meaning this test can be used) for the duration of the COVID-19 declaration under Section 564(b)(1) of the Act, 21 U.S.C. section 360bbb-3(b)(1), unless the authorization is terminated or revoked sooner.  Performed at [facility], [address], [place], [place] [address]      Studies: CT Angio Chest PE W and/or Wo Contrast  Result Date: 12/01/2019 CLINICAL DATA:  High probability is suspected pulmonary embolus. Shortness of breath for a week, worsening today. EXAM: CT ANGIOGRAPHY CHEST WITH CONTRAST TECHNIQUE: Multidetector CT imaging of the chest was performed using the standard protocol during bolus administration of intravenous contrast. Multiplanar CT image reconstructions and MIPs were obtained to evaluate the vascular anatomy. CONTRAST:  80mL OMNIPAQUE IOHEXOL 350 MG/ML SOLN COMPARISON:  05/09/2015 FINDINGS: Cardiovascular: Motion artifact limits evaluation. There is good opacification of the central and segmental pulmonary arteries. No focal filling defects. No evidence of significant pulmonary embolus. Normal heart size. No pericardial effusion. Normal caliber thoracic aorta. No evidence of aortic dissection. Aortic calcification. Coronary artery calcification. Venous gas likely results from intravenous injection. Mediastinum/Nodes: No significant lymphadenopathy. Esophagus is decompressed. Lungs/Pleura: Motion artifact limits evaluation. No obvious edema or consolidation suggested. No pleural effusions. Upper Abdomen: No acute abnormality. Musculoskeletal: No chest wall abnormality. No acute or significant osseous findings. Review of the MIP images confirms the above findings. IMPRESSION: 1. No evidence of significant pulmonary embolus. 2. No evidence of active pulmonary disease. 3. Aortic atherosclerosis. Aortic Atherosclerosis (ICD10-I70.0). Electronically  Signed   By: Burman Nieves M.D.   On: 12/01/2019 03:02   DG Chest Port 1 View  Result Date: 12/01/2019 CLINICAL DATA:  Shortness of breath EXAM: PORTABLE CHEST 1 VIEW COMPARISON:  12/02/2014 FINDINGS: Asymmetric hazy opacity over the right thorax. The left lung is clear. Normal cardiomediastinal silhouette. No pneumothorax. Rounded lucency with peripheral rim overlying the right apex likely related to external support device. IMPRESSION: Asymmetric hazy opacity over the right thorax, some of which is likely due to overlying soft tissue however unable to exclude underlying airspace disease such as pneumonia or asymmetric edema. Electronically Signed   By: Jasmine Pang M.D.   On: 12/01/2019 01:35    Scheduled Meds: . amLODipine  10 mg Oral Daily  . aspirin EC  81 mg Oral Daily  . [START ON 12/02/2019] azithromycin  250 mg Oral Daily  . carvedilol  25 mg Oral BID WC  . cloNIDine  0.1 mg Oral BID  . docusate sodium  100 mg Oral BID  . enoxaparin (LOVENOX) injection  40 mg Subcutaneous Q24H  . ferrous sulfate  325 mg Oral TID WC  . gabapentin  300 mg Oral TID  . hydrALAZINE  50 mg Oral TID  . [START ON 12/02/2019] hydrochlorothiazide  12.5 mg Oral Daily  . ipratropium-albuterol  3 mL Nebulization Q6H  . levothyroxine  50 mcg Oral Daily  . lisinopril  5 mg Oral Daily  . methylPREDNISolone (SOLU-MEDROL) injection  40 mg Intravenous Q12H  . pantoprazole  40 mg Oral BID  . pravastatin  20 mg Oral Daily  . sucralfate  1 g Oral BID    Assessment/Plan:  1. Acute hypoxic hypercapnic respiratory failure.  Patient initially required BiPAP on presentation.  Now on nasal cannula.  With PCO2 of 74 she does qualify for NIV at night.  We will get transitional care team to work on this. 2. COPD exacerbation with bronchitis.  Continue Solu-Medrol 40 mg IV twice daily and DuoNeb nebulizer solution.  Patient received Rocephin and Zithromax in the emergency room I will continue Zithromax for tomorrow.  CT scan  of the chest negative for pulmonary embolism and did not mention pneumonia. 3. Hypothyroidism unspecified on levothyroxine 4. Essential hypertension on numerous antihypertensive medications 5. Chronic kidney disease stage IIIa.  We will hold hydrochlorothiazide 6. Hyperlipidemia unspecified on pravastatin 7. Chronic pain.  Follows with pain management.  Asking pharmacist to review medications because the Beckey Downing Contin is listed as an as needed medication.      Code Status:     Code Status Orders  (From admission, onward)         Start     Ordered   12/01/19 0347  Full code  Continuous        12/01/19 0349        Code Status History    Date Active Date Inactive Code Status Order ID Comments User Context   12/02/2014 2133 12/04/2014 2202 Full Code [id number]  Auburn Bilberry, MD Inpatient   09/05/2014 2316 09/07/2014 1606 Full Code [id number]  Oralia Manis, MD Inpatient   Advance Care Planning Activity     Family Communication: Spoke with Ander Slade on the phone Disposition Plan: Status is: Observation  Dispo: The patient is from: Home              Anticipated d/c is to: Home              Anticipated d/c date is: Likely will couple days in the hospital to get respiratory status better.              Patient currently being treated for acute hypoxic hypercapnic respiratory failure with BiPAP at night.  Patient candidate for NIV which may take time to set up.  Patient being treated for COPD exacerbation  Antibiotics:  Zithromax  Time spent: 27 minutes  [employer]

## 2019-12-01 NOTE — ED Provider Notes (Signed)
[facility] Emergency Department Provider Note  ____________________________________________   First MD Initiated Contact with Patient 12/01/19 0106     (approximate)  I have reviewed the triage vital signs and the nursing notes.   HISTORY  Chief Complaint Shortness of Breath    HPI Kimberly Mccarthy is a 64 y.o. female with below list of previous medical conditions including peripheral vascular disease renal failure CVA hypertension "blood clots" presents to the emergency department via EMS secondary to respiratory distress.  EMS states that the patient has had progressive dyspnea over the course of today unrelieved with home inhaler.  Patient states that symptoms have progressed over the course of the day including dyspnea and cough.  Patient does admit to sick contact with a "cold".  Patient denies any fever no chest pain.  Patient states that she received both doses of the Covid vaccine and recently received a test that was negative.  EMS states on arrival patient with apparent respiratory distress and as such CPAP was applied however patient was unable to tolerate and as such Pap was only in place for 10 minutes.        Past Medical History:  Diagnosis Date  . Accelerated hypertension 09/05/2014  . Anemia   . Anemia   . Arterial disease (HCC) 01/11/2014  . AVM (arteriovenous malformation)   . Carotid artery disease (HCC) 10/31/2015  . Carotid artery stenosis   . Critical ischemia of lower extremity 07/18/2014  . H/O blood clots   . Hyperkalemia   . Hypertension   . Hypothyroidism 09/05/2014  . PVD (peripheral vascular disease) (HCC)   . Renal failure   . Stroke [facility])    may 2015  . Thyroid disease     Patient Active Problem List   Diagnosis Date Noted  . Chronic kidney disease, stage 3b 12/01/2019  . Acute respiratory failure with hypoxia (HCC) 12/01/2019  . COPD with acute bronchitis (HCC) 12/01/2019  . COPD with acute exacerbation (HCC)  12/01/2019  . Carotid artery disease (HCC) 10/31/2015  . Pain in both lower legs 08/30/2015  . CRPS (complex regional pain syndrome) type I of lower limb 08/30/2015  . Intermittent claudication (HCC) 01/03/2015  . Chest pain 12/04/2014  . Acute gastroenteritis 12/04/2014  . Elevated liver enzymes   . Abdominal pain, epigastric   . HTN (hypertension), malignant 12/02/2014  . Accelerated hypertension 09/05/2014  . Hypertension 09/05/2014  . Gastritis without bleeding 09/05/2014  . Elevated troponin 09/05/2014  . AKI (acute kidney injury) (HCC) 09/05/2014  . Hypothyroidism 09/05/2014  . Critical ischemia of lower extremity 07/18/2014  . High potassium 02/03/2014  . Arterial disease (HCC) 01/11/2014  . Acid reflux 11/23/2013  . Cerebrovascular accident, old 11/23/2013  . Chronic kidney disease 08/02/2013    Past Surgical History:  Procedure Laterality Date  . FEMORAL ENDARTERECTOMY Right   . left foot surgery  02/2014  . LEG SURGERY Left     Prior to Admission medications   Medication Sig Start Date End Date Taking? Authorizing Provider  amLODipine (NORVASC) 10 MG tablet Take 10 mg by mouth daily.    [provider]  aspirin EC 81 MG tablet Take 81 mg by mouth daily.    [provider]  carvedilol (COREG) 25 MG tablet Take 25 mg by mouth 2 (two) times daily with a meal.    [provider]  cloNIDine (CATAPRES) 0.3 MG tablet Take 0.3 mg by mouth 2 (two) times daily.    [provider]  diazepam (VALIUM) 2 MG tablet Take 1 tablet (2 mg total) by mouth every 6 (six) hours as needed for anxiety. 08/09/17   Fisher, Roselyn Bering, PA-C  docusate sodium (COLACE) 100 MG capsule Take 100 mg by mouth 2 (two) times daily.    [provider]  Ferrous Gluconate 324 (37.5 Fe) MG TABS Take by mouth.    [provider]  ferrous sulfate 325 (65 FE) MG tablet Take 325 mg by mouth 3 (three) times daily with meals.     [provider]    gabapentin (NEURONTIN) 300 MG capsule Take 300 mg by mouth 3 (three) times daily.    [provider]  hydrALAZINE (APRESOLINE) 50 MG tablet Take 50 mg by mouth 3 (three) times daily. 05/01/15   [provider]  hydrochlorothiazide (MICROZIDE) 12.5 MG capsule Take 12.5 mg by mouth daily.    [provider]  levothyroxine (SYNTHROID, LEVOTHROID) 50 MCG tablet Take 50 mcg by mouth daily.    [provider]  lisinopril (PRINIVIL,ZESTRIL) 40 MG tablet Take by mouth.    [provider]  Multiple Vitamin (MULTIVITAMIN WITH MINERALS) TABS tablet Take 1 tablet by mouth daily.    [provider]  pantoprazole (PROTONIX) 40 MG tablet Take 40 mg by mouth 2 (two) times daily.    [provider]  pravastatin (PRAVACHOL) 20 MG tablet Take 20 mg by mouth daily. 05/01/15   [provider]  predniSONE (STERAPRED UNI-PAK 21 TAB) 10 MG (21) TBPK tablet Take 6 pills on day one then decrease by 1 pill each day 08/09/17   Bartholomew Boards  [facility] HFA 108 [phone] Base) MCG/ACT inhaler  10/11/15   [provider]  sucralfate (CARAFATE) 1 g tablet Take by mouth.    [provider]    Allergies Patient has no known allergies.  Family History  Problem Relation Age of Onset  . Diabetes Mother   . Hypertension Mother   . Hypertension Sister   . Thyroid disease Sister   . Breast cancer Neg Hx     Social History Social History   Tobacco Use  . Smoking status: Former Smoker    Packs/day: 0.33    Years: 20.00    Pack years: 6.60    Types: Cigarettes  . Smokeless tobacco: Never Used  Substance Use Topics  . Alcohol use: No    Alcohol/week: 0.0 standard drinks  . Drug use: No    Review of Systems Constitutional: No fever/chills Eyes: No visual changes. ENT: No sore throat. Cardiovascular: Denies chest pain. Respiratory: Positive for dyspnea and cough Gastrointestinal: No abdominal pain.  No nausea, no vomiting.  No  diarrhea.  No constipation. Genitourinary: Negative for dysuria. Musculoskeletal: Negative for neck pain.  Negative for back pain. Integumentary: Negative for rash. Neurological: Negative for headaches, focal weakness or numbness.   ____________________________________________   PHYSICAL EXAM:  VITAL SIGNS: ED Triage Vitals  Enc Vitals Group     BP 12/01/19 0102 (!) 158/94     Pulse Rate 12/01/19 0050 68     Resp 12/01/19 0050 (!) 22     Temp 12/01/19 0102 98.3 F (36.8 C)     Temp Source 12/01/19 0102 Oral     SpO2 12/01/19 0050 100 %     Weight --      Height --      Head Circumference --      Peak Flow --      Pain  Score 12/01/19 0103 0     Pain Loc --      Pain Edu? --      Excl. in GC? --      Constitutional: Alert and oriented.  Eyes: Conjunctivae are normal.  Head: Atraumatic.Marland Kitchen Mouth/Throat: Patient is wearing a mask. Neck: No stridor.  No meningeal signs.   Cardiovascular: Normal rate, regular rhythm. Good peripheral circulation. Grossly normal heart sounds. Respiratory: Tachypnea, positive accessory respiratory muscle use, diffuse rhonchi and expiratory wheezing Gastrointestinal: Soft and nontender. No distention.  Musculoskeletal: No lower extremity tenderness nor edema. No gross deformities of extremities. Neurologic:  Normal speech and language. No gross focal neurologic deficits are appreciated.  Skin:  Skin is warm, dry and intact. Psychiatric: Mood and affect are normal. Speech and behavior are normal.  ____________________________________________   LABS (all labs ordered are listed, but only abnormal results are displayed)  Labs Reviewed  CBC WITH DIFFERENTIAL/PLATELET - Abnormal; Notable for the following components:      Result Value   RDW 15.9 (*)    All other components within normal limits  BASIC METABOLIC PANEL - Abnormal; Notable for the following components:   CO2 33 (*)    Creatinine, Ser 1.36 (*)    Calcium 8.6 (*)    GFR calc non  Af Amer 41 (*)    GFR calc Af Amer 48 (*)    All other components within normal limits  BRAIN NATRIURETIC PEPTIDE - Abnormal; Notable for the following components:   B Natriuretic Peptide 109.8 (*)    All other components within normal limits  FIBRIN DERIVATIVES D-DIMER ([facility] ONLY) - Abnormal; Notable for the following components:   Fibrin derivatives D-dimer ([facility]) 669.57 (*)    All other components within normal limits  SARS CORONAVIRUS 2 BY RT PCR (HOSPITAL ORDER, PERFORMED IN [facility])  BLOOD GAS, VENOUS  HIV ANTIBODY (ROUTINE TESTING W REFLEX)  HIV ANTIBODY (ROUTINE TESTING W REFLEX)   ____________________________________________  EKG  ED ECG REPORT I, [place] Ivi Griffith, the attending physician, personally viewed and interpreted this ECG.   Date: 12/01/2019  EKG Time: 1:28 AM  Rate: 61  Rhythm: Normal sinus rhythm  Axis: Normal  Intervals: Normal  ST&T Change: None  ____________________________________________  RADIOLOGY I, [place] N Elleah Hemsley, personally viewed and evaluated these images (plain radiographs) as part of my medical decision making, as well as reviewing the written report by the radiologist.  ED MD interpretation: CT revealed no evidence of pulmonary emboli no active pulmonary disease per radiologist.    Official radiology report(s): CT Angio Chest PE W and/or Wo Contrast  Result Date: 12/01/2019 CLINICAL DATA:  High probability is suspected pulmonary embolus. Shortness of breath for a week, worsening today. EXAM: CT ANGIOGRAPHY CHEST WITH CONTRAST TECHNIQUE: Multidetector CT imaging of the chest was performed using the standard protocol during bolus administration of intravenous contrast. Multiplanar CT image reconstructions and MIPs were obtained to evaluate the vascular anatomy. CONTRAST:  32mL OMNIPAQUE IOHEXOL 350 MG/ML SOLN COMPARISON:  05/09/2015 FINDINGS: Cardiovascular: Motion artifact limits evaluation. There is good opacification of  the central and segmental pulmonary arteries. No focal filling defects. No evidence of significant pulmonary embolus. Normal heart size. No pericardial effusion. Normal caliber thoracic aorta. No evidence of aortic dissection. Aortic calcification. Coronary artery calcification. Venous gas likely results from intravenous injection. Mediastinum/Nodes: No significant lymphadenopathy. Esophagus is decompressed. Lungs/Pleura: Motion artifact limits evaluation. No obvious edema or consolidation suggested. No pleural effusions. Upper Abdomen: No acute abnormality. Musculoskeletal:  No chest wall abnormality. No acute or significant osseous findings. Review of the MIP images confirms the above findings. IMPRESSION: 1. No evidence of significant pulmonary embolus. 2. No evidence of active pulmonary disease. 3. Aortic atherosclerosis. Aortic Atherosclerosis (ICD10-I70.0). Electronically Signed   By: Burman NievesWilliam  Stevens M.D.   On: 12/01/2019 03:02   DG Chest Port 1 View  Result Date: 12/01/2019 CLINICAL DATA:  Shortness of breath EXAM: PORTABLE CHEST 1 VIEW COMPARISON:  12/02/2014 FINDINGS: Asymmetric hazy opacity over the right thorax. The left lung is clear. Normal cardiomediastinal silhouette. No pneumothorax. Rounded lucency with peripheral rim overlying the right apex likely related to external support device. IMPRESSION: Asymmetric hazy opacity over the right thorax, some of which is likely due to overlying soft tissue however unable to exclude underlying airspace disease such as pneumonia or asymmetric edema. Electronically Signed   By: Jasmine PangKim  Fujinaga M.D.   On: 12/01/2019 01:35    ____________________________________________   PROCEDURES    .Critical Care Performed by: Darci CurrentBrown, [place] N, MD Authorized by: Darci CurrentBrown, [place] N, MD   Critical care provider statement:    Critical care time (minutes):  30   Critical care time was exclusive of:  Separately billable procedures and treating other patients    Critical care was necessary to treat or prevent imminent or life-threatening deterioration of the following conditions:  Respiratory failure   Critical care was time spent personally by me on the following activities:  Development of treatment plan with patient or surrogate, discussions with consultants, evaluation of patient's response to treatment, examination of patient, obtaining history from patient or surrogate, ordering and performing treatments and interventions, ordering and review of laboratory studies, ordering and review of radiographic studies, pulse oximetry, re-evaluation of patient's condition and review of old charts     ____________________________________________   INITIAL IMPRESSION / MDM / ASSESSMENT AND PLAN / ED COURSE  As part of my medical decision making, I reviewed the following data within the electronic MEDICAL RECORD NUMBER  64 year old female presenting with respiratory distress with differential diagnosis including but not limited to pulmonary emboli COPD pneumonia.  Patient placed on BiPAP on arrival to the emergency department.  Patient received 2 duo nebs and 125 mg of IV Solu-Medrol.  CT scan did not reveal any evidence of pulmonary emboli.  Patient on reevaluation now on 3 L with markedly decreased work of breathing satting 98% on 3 L via nasal cannula.  Able to speak complete sentences at present and therefore informs me that she smoked cigarettes for a long time and that she has had bronchitis in the past which is why she had inhalers at home. ____________________________________________  FINAL CLINICAL IMPRESSION(S) / ED DIAGNOSES  Final diagnoses:  Acute respiratory failure with hypoxia (HCC)     MEDICATIONS GIVEN DURING THIS VISIT:  Medications  enoxaparin (LOVENOX) injection 40 mg (has no administration in time range)  0.9 %  sodium chloride infusion (has no administration in time range)  methylPREDNISolone sodium succinate (SOLU-MEDROL) 40 mg/mL  injection 40 mg (has no administration in time range)    Followed by  predniSONE (DELTASONE) tablet 40 mg (has no administration in time range)  ipratropium-albuterol (DUONEB) 0.5-2.5 (3) MG/3ML nebulizer solution 3 mL (has no administration in time range)  albuterol (PROVENTIL) (2.5 MG/3ML) 0.083% nebulizer solution 2.5 mg (has no administration in time range)  ipratropium-albuterol (DUONEB) 0.5-2.5 (3) MG/3ML nebulizer solution 3 mL (3 mLs Nebulization Given 12/01/19 0108)  ipratropium-albuterol (DUONEB) 0.5-2.5 (3) MG/3ML nebulizer solution 3 mL (3  mLs Nebulization Given 12/01/19 0109)  methylPREDNISolone sodium succinate (SOLU-MEDROL) 125 mg/2 mL injection 125 mg (125 mg Intravenous Given 12/01/19 0109)  LORazepam (ATIVAN) injection 1 mg (1 mg Intravenous Given 12/01/19 0110)  cefTRIAXone (ROCEPHIN) 1 g in sodium chloride 0.9 % 100 mL IVPB (0 g Intravenous Stopped 12/01/19 0209)  azithromycin (ZITHROMAX) 500 mg in sodium chloride 0.9 % 250 mL IVPB (500 mg Intravenous New Bag/Given 12/01/19 0211)  iohexol (OMNIPAQUE) 350 MG/ML injection 85 mL (85 mLs Intravenous Contrast Given 12/01/19 0252)     ED Discharge Orders    None      *Please note:  Kimberly Mccarthy was evaluated in Emergency Department on 12/01/2019 for the symptoms described in the history of present illness. She was evaluated in the context of the global COVID-19 pandemic, which necessitated consideration that the patient might be at risk for infection with the SARS-CoV-2 virus that causes COVID-19. Institutional protocols and algorithms that pertain to the evaluation of patients at risk for COVID-19 are in a state of rapid change based on information released by regulatory bodies including the CDC and federal and state organizations. These policies and algorithms were followed during the patient's care in the ED.  Some ED evaluations and interventions may be delayed as a result of limited staffing during and after the pandemic.*  Note:  This  document was prepared using Dragon voice recognition software and may include unintentional dictation errors.   Darci Current, MD 12/01/19 [phone]

## 2019-12-01 NOTE — ED Notes (Signed)
ER provider states he will go and see pt and determine if pt needs to be placed back on bipap

## 2019-12-01 NOTE — Progress Notes (Signed)
PT Cancellation Note  Patient Details Name: Kimberly Mccarthy MRN: [id number] DOB: July 02, 1955   Cancelled Treatment:    Reason Eval/Treat Not Completed: Medical issues which prohibited therapy Spoke with ED nurse, reports that pt is on BiPAP and that we wouldn't really be able to do anything with her.  Notes that she had urge to pee earlier and she was able to ambulate to the bathroom w/o direct assist (on room air secondary to urgancy) but was extremely fatigued/short of breath with the effort.  Pt likely to transfer to step-down unit and hope to be able to see her as her situation stabilizes.    Malachi Pro, DPT 12/01/2019, 11:19 AM

## 2019-12-01 NOTE — ED Notes (Signed)
Date and time results received: 12/01/19 0438 (use smartphrase ".now" to insert current time)  Test: pC02 Critical Value: 43  Name of Provider Notified: Dr. Para March  Orders Received? Or Actions Taken?: Provider notified. RT states they will place pt back on bipap

## 2019-12-01 NOTE — Plan of Care (Signed)
  Problem: Clinical Measurements: Goal: Respiratory complications will improve Outcome: Progressing Note: Pt. Is on IV solumedrol and getting nebs. Cpap at night    Problem: Activity: Goal: Risk for activity intolerance will decrease Outcome: Progressing

## 2019-12-01 NOTE — ED Notes (Signed)
Pt states coming in with shortness of breath for the last week. Pt states testing negative yesterday for covid-19, but that she was still having breathing problems.

## 2019-12-01 NOTE — ED Notes (Signed)
Pt came in under a wrong pt name. Charge RN working on transfering all informpation from the previous pt to this patient, the correct pt.  Pt came in with SOB and difficulty breathing. Pt denies pain. Pt states testing negative for covid-19 yesterday. Pt noted to have expiratory wheezing. Duoneb, ativan, solumedrol given.   Pt is on cardiac, bp and pulse ox monitoring.

## 2019-12-01 NOTE — Progress Notes (Signed)
Initial Nutrition Assessment  DOCUMENTATION CODES:   Morbid obesity  INTERVENTION:   Ensure Enlive po BID, each supplement provides 350 kcal and 20 grams of protein  Liberalize diet   NUTRITION DIAGNOSIS:   Increased nutrient needs related to catabolic illness (COPD) as evidenced by increased estimated needs.  GOAL:   Patient will meet greater than or equal to 90% of their needs  MONITOR:   PO intake, Supplement acceptance, Labs, Weight trends, Skin, I & O's  REASON FOR ASSESSMENT:   Consult Assessment of nutrition requirement/status  ASSESSMENT:   63 y/o female with h/o COPD, CKD III, HTN, HLD, hypothyroidism and bronchitis who is admitted with SOB  Met with pt in room today. Pt reports good appetite and oral intake at baseline. Pt reports that her appetite is not too good in the hospital; pt reports eating some grits for breakfast this morning. RD suspects that pt does not like the hospital food. RD discussed with pt the importance of adequate nutrition needed to preserve lean muscle. Pt would like to try strawberry Ensure. RD will also liberalize the heart healthy portion of pt's diet as this is restrictive of protein. Per chart, pt appears fairly weight stable at baseline.   Medications reviewed and include: aspirin, azithromycin, colace, ferrous sulfate, synthroid, solu-medrol, protonix, sucralfate  Labs reviewed: creat 1.36(H)  NUTRITION - FOCUSED PHYSICAL EXAM:    Most Recent Value  Orbital Region No depletion  Upper Arm Region No depletion  Thoracic and Lumbar Region No depletion  Buccal Region No depletion  Temple Region No depletion  Clavicle Bone Region No depletion  Clavicle and Acromion Bone Region No depletion  Scapular Bone Region No depletion  Dorsal Hand No depletion  Patellar Region No depletion  Anterior Thigh Region No depletion  Posterior Calf Region No depletion  Edema (RD Assessment) None  Hair Reviewed  Eyes Reviewed  Mouth Reviewed   Skin Reviewed  Nails Reviewed     Diet Order:   Diet Order            Diet 2 gram sodium Room service appropriate? Yes; Fluid consistency: Thin  Diet effective now                EDUCATION NEEDS:   Education needs have been addressed  Skin:  Skin Assessment: Reviewed RN Assessment  Last BM:  PTA  Height:   Ht Readings from Last 1 Encounters:  12/01/19 5' 6" (1.676 m)    Weight:   Wt Readings from Last 1 Encounters:  12/01/19 126.6 kg    Ideal Body Weight:  59 kg  BMI:  Body mass index is 45.06 kg/m.  Estimated Nutritional Needs:   Kcal:  2200-2500kcal/day  Protein:  110-125g/day  Fluid:  1.8-2.1L/day  Casey Campbell MS, RD, LDN Please refer to [facility]ION for RD and/or RD on-call/weekend/after hours pager  

## 2019-12-01 NOTE — ED Notes (Signed)
Pt oob to BR with standby assist. NAD

## 2019-12-01 NOTE — ED Notes (Signed)
Pt taken off bipap for CT. Pt on 3lpm via [place] and tolerating well. Pt noted to have dyspnea with exertion.

## 2019-12-02 LAB — BASIC METABOLIC PANEL
Anion gap: 12 (ref 5–15)
BUN: 24 mg/dL — ABNORMAL HIGH (ref 8–23)
CO2: 27 mmol/L (ref 22–32)
Calcium: 8.9 mg/dL (ref 8.9–10.3)
Chloride: 101 mmol/L (ref 98–111)
Creatinine, Ser: 1.41 mg/dL — ABNORMAL HIGH (ref 0.44–1.00)
GFR calc Af Amer: 46 mL/min — ABNORMAL LOW (ref >60)
GFR calc non Af Amer: 40 mL/min — ABNORMAL LOW (ref >60)
Glucose, Bld: 164 mg/dL — ABNORMAL HIGH (ref 70–99)
Potassium: 4.5 mmol/L (ref 3.5–5.1)
Sodium: 140 mmol/L (ref 135–145)

## 2019-12-02 MED ORDER — CARVEDILOL 12.5 MG PO TABS
12.5000 mg | ORAL_TABLET | Freq: Two times a day (BID) | ORAL | Status: DC
  Administered 2019-12-02: 12.5 mg via ORAL

## 2019-12-02 MED ORDER — CLONIDINE HCL 0.1 MG PO TABS
0.2000 mg | ORAL_TABLET | Freq: Two times a day (BID) | ORAL | Status: DC
  Administered 2019-12-02 – 2019-12-03 (×2): 0.2 mg via ORAL
  Filled 2019-12-02 (×2): qty 2

## 2019-12-02 MED ORDER — CLONIDINE HCL 0.1 MG PO TABS
0.3000 mg | ORAL_TABLET | Freq: Two times a day (BID) | ORAL | Status: DC
  Administered 2019-12-02: 0.3 mg via ORAL
  Filled 2019-12-02: qty 3

## 2019-12-02 MED ORDER — IPRATROPIUM-ALBUTEROL 0.5-2.5 (3) MG/3ML IN SOLN
3.0000 mL | Freq: Three times a day (TID) | RESPIRATORY_TRACT | Status: DC
  Administered 2019-12-02 – 2019-12-03 (×3): 3 mL via RESPIRATORY_TRACT
  Filled 2019-12-02 (×4): qty 3

## 2019-12-02 MED ORDER — ACETAMINOPHEN 325 MG PO TABS
650.0000 mg | ORAL_TABLET | Freq: Four times a day (QID) | ORAL | Status: DC | PRN
  Administered 2019-12-02 (×2): 650 mg via ORAL
  Filled 2019-12-02 (×2): qty 2

## 2019-12-02 NOTE — Progress Notes (Signed)
SATURATION QUALIFICATIONS: (This note is used to comply with regulatory documentation for home oxygen)  Patient Saturations on Room Air at Rest = 97%  Patient Saturations on Room Air while Ambulating = 97%  Patient Saturations on na Liters of oxygen while Ambulating = na%  Please briefly explain why patient needs home oxygen: ambulated once around nurses station. Patient still sob during ambulation. Patient states she does not feel well enough to go home today. Dr. Renae Gloss on the floor and was made aware of patients concerns

## 2019-12-02 NOTE — TOC Progression Note (Signed)
Transition of Care [facility]) - Progression Note    Patient Details  Name: Kimberly Mccarthy MRN: [id number] Date of Birth: 08-03-1955  [facility] CM/SW Contact  Ashley Royalty Lutricia Feil, RN Phone Number: 12/02/2019, 5:20 PM  Clinical Narrative:     [facility] RN spoke with pt bedside for possible discharge tomorrow. Pt will need Trilogy for NIV PCO2 @74 . Agency of choice with [employer] ) who will deliver and educate pt on how this portable unit is set up and utilized. Pt refused the recommended shower-seat. States she will return to her sister's house to recovery at this time. Pt aware to use precaution with visitors and family members in the home to avoid risk of readmission.  Pt will clear understanding of these risk. Pt express how appreciative she was for RN case manager speak with her today on the importance and use of the DME recommended. States she was not going to accept this device for ongoing treatment however changed her mind after being educated on why it was important for her to use this device to assist with her breathing from both the provider and RN case manager. Pt very tearful in expressing her gratitude.   TOC will continue to follow.       Expected Discharge Plan and Services                                                 Social Determinants of Health (SDOH) Interventions    Readmission Risk Interventions No flowsheet data found.

## 2019-12-02 NOTE — Progress Notes (Signed)
Patient ID: Kimberly Mccarthy, female   DOB: 1956-04-16, 64 y.o.   MRN: [id number] [facility] PROGRESS NOTE  Kimberly Mccarthy UUV:[id number] DOB: Oct 22, 1955 DOA: 12/01/2019 PCP: [facility]  HPI/Subjective: Patient feeling a little bit better than yesterday. Still little short of breath with ambulation. Still little cough.  Objective: Vitals:   12/02/19 0940 12/02/19 1118  BP:  (!) 154/68  Pulse:  64  Resp:  19  Temp:  97.8 F (36.6 C)  SpO2: 97% 96%    Filed Weights   12/01/19 1302 12/01/19 1352 12/02/19 0435  Weight: 125.8 kg 126.6 kg 125.7 kg    ROS: Review of Systems  Respiratory: Positive for shortness of breath.   Cardiovascular: Negative for chest pain.  Gastrointestinal: Negative for abdominal pain.   Exam: Physical Exam HENT:     Nose: No mucosal edema.     Mouth/Throat:     Pharynx: No oropharyngeal exudate.  Eyes:     General: Lids are normal.     Conjunctiva/sclera: Conjunctivae normal.     Pupils: Pupils are equal, round, and reactive to light.  Cardiovascular:     Rate and Rhythm: Normal rate and regular rhythm.     Heart sounds: Normal heart sounds, S1 normal and S2 normal.  Pulmonary:     Breath sounds: Examination of the right-middle field reveals decreased breath sounds. Examination of the right-lower field reveals decreased breath sounds and rhonchi. Decreased breath sounds and rhonchi present. No wheezing or rales.  Abdominal:     Palpations: Abdomen is soft.     Tenderness: There is no abdominal tenderness.  Musculoskeletal:     Right lower leg: No swelling.     Left lower leg: No swelling.  Skin:    General: Skin is warm.     Findings: No rash.  Neurological:     Mental Status: She is alert and oriented to person, place, and time.       Data Reviewed: Basic Metabolic Panel: Recent Labs  Lab 12/01/19 0053 12/02/19 0850  NA 144 140  K 4.1 4.5  CL 102 101  CO2 33* 27  GLUCOSE 91 164*  BUN 21 24*   CREATININE 1.36* 1.41*  CALCIUM 8.6* 8.9   CBC: Recent Labs  Lab 12/01/19 0053  WBC 5.8  NEUTROABS 2.6  HGB 13.2  HCT 41.9  MCV 91.9  PLT 332   BNP (last 3 results) Recent Labs    12/01/19 0053  BNP 109.8*     Recent Results (from the past 240 hour(s))  SARS Coronavirus 2 by RT PCR (hospital order, performed in [facility] hospital lab) Nasopharyngeal Nasopharyngeal Swab     Status: None   Collection Time: 12/01/19 12:53 AM   Specimen: Nasopharyngeal Swab  Result Value Ref Range Status   SARS Coronavirus 2 NEGATIVE NEGATIVE Final    Comment: (NOTE) SARS-CoV-2 target nucleic acids are NOT DETECTED.  The SARS-CoV-2 RNA is generally detectable in upper and lower respiratory specimens during the acute phase of infection. The lowest concentration of SARS-CoV-2 viral copies this assay can detect is 250 copies / mL. A negative result does not preclude SARS-CoV-2 infection and should not be used as the sole basis for treatment or other patient management decisions.  A negative result may occur with improper specimen collection / handling, submission of specimen other than nasopharyngeal swab, presence of viral mutation(s) within the areas targeted by this assay, and inadequate number of viral copies (<250 copies / mL). A  negative result must be combined with clinical observations, patient history, and epidemiological information.  Fact Sheet for Patients:   BoilerBrush.com.cy  Fact Sheet for Healthcare Providers: https://pope.com/  This test is not yet approved or  cleared by the [place] FDA and has been authorized for detection and/or diagnosis of SARS-CoV-2 by FDA under an Emergency Use Authorization (EUA).  This EUA will remain in effect (meaning this test can be used) for the duration of the COVID-19 declaration under Section 564(b)(1) of the Act, 21 U.S.C. section 360bbb-3(b)(1), unless the authorization is  terminated or revoked sooner.  Performed at [facility], [address] [place], [place] [address]      Studies: CT Angio Chest PE W and/or Wo Contrast  Result Date: 12/01/2019 CLINICAL DATA:  High probability is suspected pulmonary embolus. Shortness of breath for a week, worsening today. EXAM: CT ANGIOGRAPHY CHEST WITH CONTRAST TECHNIQUE: Multidetector CT imaging of the chest was performed using the standard protocol during bolus administration of intravenous contrast. Multiplanar CT image reconstructions and MIPs were obtained to evaluate the vascular anatomy. CONTRAST:  31mL OMNIPAQUE IOHEXOL 350 MG/ML SOLN COMPARISON:  05/09/2015 FINDINGS: Cardiovascular: Motion artifact limits evaluation. There is good opacification of the central and segmental pulmonary arteries. No focal filling defects. No evidence of significant pulmonary embolus. Normal heart size. No pericardial effusion. Normal caliber thoracic aorta. No evidence of aortic dissection. Aortic calcification. Coronary artery calcification. Venous gas likely results from intravenous injection. Mediastinum/Nodes: No significant lymphadenopathy. Esophagus is decompressed. Lungs/Pleura: Motion artifact limits evaluation. No obvious edema or consolidation suggested. No pleural effusions. Upper Abdomen: No acute abnormality. Musculoskeletal: No chest wall abnormality. No acute or significant osseous findings. Review of the MIP images confirms the above findings. IMPRESSION: 1. No evidence of significant pulmonary embolus. 2. No evidence of active pulmonary disease. 3. Aortic atherosclerosis. Aortic Atherosclerosis (ICD10-I70.0). Electronically Signed   By: Burman Nieves M.D.   On: 12/01/2019 03:02   DG Chest Port 1 View  Result Date: 12/01/2019 CLINICAL DATA:  Shortness of breath EXAM: PORTABLE CHEST 1 VIEW COMPARISON:  12/02/2014 FINDINGS: Asymmetric hazy opacity over the right thorax. The left lung is clear. Normal cardiomediastinal  silhouette. No pneumothorax. Rounded lucency with peripheral rim overlying the right apex likely related to external support device. IMPRESSION: Asymmetric hazy opacity over the right thorax, some of which is likely due to overlying soft tissue however unable to exclude underlying airspace disease such as pneumonia or asymmetric edema. Electronically Signed   By: Jasmine Pang M.D.   On: 12/01/2019 01:35    Scheduled Meds: . amLODipine  10 mg Oral Daily  . aspirin EC  81 mg Oral Daily  . azithromycin  250 mg Oral Daily  . carvedilol  25 mg Oral BID WC  . cloNIDine  0.3 mg Oral BID  . docusate sodium  100 mg Oral BID  . enoxaparin (LOVENOX) injection  40 mg Subcutaneous Q12H  . feeding supplement (ENSURE ENLIVE)  237 mL Oral BID BM  . ferrous sulfate  325 mg Oral TID WC  . gabapentin  300 mg Oral BID   And  . gabapentin  600 mg Oral QHS  . hydrALAZINE  50 mg Oral TID  . ipratropium-albuterol  3 mL Nebulization Q6H  . levothyroxine  50 mcg Oral Daily  . methylPREDNISolone (SOLU-MEDROL) injection  40 mg Intravenous Q12H  . oxyCODONE  10 mg Oral Q12H  . pantoprazole  40 mg Oral BID  . pneumococcal 23 valent vaccine  0.5  mL Intramuscular Tomorrow-1000  . pravastatin  20 mg Oral Daily  . sucralfate  1 g Oral BID    Assessment/Plan:  1. Acute hypoxic hypercapnic respiratory failure.  Patient initially required BiPAP on presentation.  Will do BiPAP at night.  Patient qualifies for noninvasive ventilation at night with a PCO2 of 74.  Case discussed with transitional care team to try to get this set up.  Patient able to be tapered off nasal cannula at this time. 2. COPD exacerbation with bronchitis.  Continue Solu-Medrol 40 mg IV twice a day and DuoNeb nebulizer solution.  Patient received Rocephin and Zithromax in the emergency room and I will continue Zithromax daily.  CT scan of the chest was negative for pulmonary embolism and did not mention pneumonia. 3. Hypothyroidism unspecified on  levothyroxine 4. Essential hypertension on numerous antihypertensive medications.  Hold hydrochlorothiazide. 5. Chronic kidney disease stage IIIa 6. Hyperlipidemia unspecified on pravastatin 7. Chronic pain on OxyContin twice daily.     Code Status:     Code Status Orders  (From admission, onward)         Start     Ordered   12/01/19 0347  Full code  Continuous        12/01/19 0349        Code Status History    Date Active Date Inactive Code Status Order ID Comments User Context   12/02/2014 2133 12/04/2014 2202 Full Code [id number]  Auburn Bilberry, MD Inpatient   09/05/2014 2316 09/07/2014 1606 Full Code [id number]  Oralia Manis, MD Inpatient   Advance Care Planning Activity     Family Communication: Left a message for the patient's Sister Claris Gower Disposition Plan: Status is: Inpatient  Dispo: The patient is from: Home              Anticipated d/c is to: Home              Anticipated d/c date is: 12/03/2019              Patient currently being treated for COPD exacerbation with IV Solu-Medrol.  Patient still with rhonchi right lower lobe.  Antibiotics:  Zithromax  Time spent: 28 minutes  [employer]

## 2019-12-02 NOTE — Progress Notes (Signed)
Made dr. Renae Gloss aware heart rate is ranging 57-61, currently on coreg 25mg  bid. Per md will place order for 12.5mg  po bid and will change clonidine down to 0.2 mg twice a day

## 2019-12-03 DIAGNOSIS — J9601 Acute respiratory failure with hypoxia: Secondary | ICD-10-CM

## 2019-12-03 DIAGNOSIS — J9602 Acute respiratory failure with hypercapnia: Secondary | ICD-10-CM

## 2019-12-03 DIAGNOSIS — N1832 Chronic kidney disease, stage 3b: Secondary | ICD-10-CM

## 2019-12-03 MED ORDER — CARVEDILOL 25 MG PO TABS
25.0000 mg | ORAL_TABLET | Freq: Two times a day (BID) | ORAL | Status: DC
  Administered 2019-12-03: 25 mg via ORAL
  Filled 2019-12-03: qty 1

## 2019-12-03 MED ORDER — ADVAIR HFA 115-21 MCG/ACT IN AERO
2.0000 | INHALATION_SPRAY | Freq: Two times a day (BID) | RESPIRATORY_TRACT | 0 refills | Status: DC
Start: 2019-12-03 — End: 2020-03-05

## 2019-12-03 MED ORDER — ALBUTEROL SULFATE (2.5 MG/3ML) 0.083% IN NEBU: 2.5000 mg | INHALATION_SOLUTION | Freq: Four times a day (QID) | RESPIRATORY_TRACT | 0 refills | Status: DC | PRN

## 2019-12-03 MED ORDER — PREDNISONE 10 MG PO TABS: ORAL_TABLET | ORAL | 0 refills | Status: AC

## 2019-12-03 MED ORDER — HYDROCOD POLST-CPM POLST ER 10-8 MG/5ML PO SUER
5.0000 mL | Freq: Two times a day (BID) | ORAL | Status: DC | PRN
  Administered 2019-12-03: 5 mL via ORAL
  Filled 2019-12-03: qty 5

## 2019-12-03 MED ORDER — GUAIFENESIN-DM 100-10 MG/5ML PO SYRP
5.0000 mL | ORAL_SOLUTION | ORAL | Status: DC | PRN
  Filled 2019-12-03: qty 5

## 2019-12-03 MED ORDER — HYDROCOD POLST-CPM POLST ER 10-8 MG/5ML PO SUER: 5.0000 mL | Freq: Two times a day (BID) | ORAL | 0 refills | Status: DC | PRN

## 2019-12-03 MED ORDER — ENSURE ENLIVE PO LIQD: 237.0000 mL | Freq: Two times a day (BID) | ORAL | 0 refills | Status: DC

## 2019-12-03 MED ORDER — AZITHROMYCIN 250 MG PO TABS: ORAL_TABLET | ORAL | 0 refills | Status: DC

## 2019-12-03 NOTE — Discharge Summary (Signed)
[facility] - [place] at [facility]   PATIENT NAME: Kimberly Mccarthy    MR#:  [id number]  DATE OF BIRTH:  Apr 13, 1956  DATE OF ADMISSION:  12/01/2019 ADMITTING PHYSICIAN: Alford Highland, MD  DATE OF DISCHARGE: 12/03/2019  2:34 PM  PRIMARY CARE PHYSICIAN: [facility]    ADMISSION DIAGNOSIS:  SOB (shortness of breath) [R06.02] Acute respiratory failure with hypoxia (HCC) [J96.01] COPD with acute exacerbation (HCC) [J44.1] Acute respiratory failure with hypercapnia (HCC) [J96.02]  DISCHARGE DIAGNOSIS:  Principal Problem:   Acute respiratory failure with hypoxia (HCC) Active Problems:   Hypothyroidism   HTN (hypertension), malignant   Chronic kidney disease, stage 3b   COPD with acute bronchitis (HCC)   COPD with acute exacerbation (HCC)   Acute on chronic respiratory failure with hypoxia and hypercapnia (HCC)   Other chronic pain   Acute respiratory failure with hypercapnia (HCC)   SECONDARY DIAGNOSIS:   Past Medical History:  Diagnosis Date  . Accelerated hypertension 09/05/2014  . Anemia   . Anemia   . Arterial disease (HCC) 01/11/2014  . AVM (arteriovenous malformation)   . Carotid artery disease (HCC) 10/31/2015  . Carotid artery stenosis   . Critical ischemia of lower extremity 07/18/2014  . H/O blood clots   . Hyperkalemia   . Hypertension   . Hypothyroidism 09/05/2014  . PVD (peripheral vascular disease) (HCC)   . Renal failure   . Stroke [facility])    may 2015  . Thyroid disease     HOSPITAL COURSE:   1. Acute hypoxic hypercarbic respiratory failure.  Patient initially required BiPAP on presentation.  The patient qualifies for noninvasive ventilation at home with a PCO2 of seventy-four.  This was set up upon discharge home.  Patient was able to be tapered off nasal cannula with good oxygen saturations prior to disposition. 2.  COPD exacerbation with bronchitis.  Patient was prescribed Solu-Medrol 40 mg IV twice a day and DuoNeb  nebulizer solution.  Patient received Rocephin and Zithromax in the emergency room and I got rid of Rocephin since CT scan did not show any pneumonia.  CT scan was also negative for pulmonary embolism.  Discharged home on finishing up course of Zithromax, prednisone, Advair inhaler, albuterol inhaler and nebulizer.  Lungs are clear on disposition. 3.  Essential hypertension on numerous antihypertensive medications.  Hold hydrochlorothiazide with chronic kidney disease. 4.  Hypothyroidism unspecified on levothyroxine 5.  Chronic kidney disease stage IIIa 6.  Hyperlipidemia unspecified on pravastatin 7.  Chronic pain on OxyContin twice a day   DISCHARGE CONDITIONS:   Satisfactory  CONSULTS OBTAINED:  None  DRUG ALLERGIES:  No Known Allergies  DISCHARGE MEDICATIONS:   Allergies as of 12/03/2019   No Known Allergies     Medication List    STOP taking these medications   hydrochlorothiazide 25 MG tablet Commonly known as: HYDRODIURIL     TAKE these medications   Advair HFA 115-21 MCG/ACT inhaler Generic drug: fluticasone-salmeterol Inhale 2 puffs into the lungs 2 (two) times daily.   amLODipine 10 MG tablet Commonly known as: NORVASC Take 10 mg by mouth daily.   aspirin EC 81 MG tablet Take 81 mg by mouth daily.   azithromycin 250 MG tablet Commonly known as: ZITHROMAX One tab po daily for two days Start taking on: December 04, 2019   carvedilol 25 MG tablet Commonly known as: COREG Take 25 mg by mouth 2 (two) times daily with a meal.   chlorpheniramine-HYDROcodone 10-8 MG/5ML Suer Commonly  known as: TUSSIONEX Take 5 mLs by mouth every 12 (twelve) hours as needed for cough.   cloNIDine 0.3 MG tablet Commonly known as: CATAPRES Take 0.3 mg by mouth 2 (two) times daily.   cyclobenzaprine 10 MG tablet Commonly known as: FLEXERIL Take 10 mg by mouth at bedtime as needed for muscle spasms.   docusate sodium 100 MG capsule Commonly known as: COLACE Take 100 mg by  mouth 2 (two) times daily.   feeding supplement (ENSURE ENLIVE) Liqd Take 237 mLs by mouth 2 (two) times daily between meals.   ferrous sulfate 325 (65 FE) MG tablet Take 325 mg by mouth 3 (three) times daily with meals.   gabapentin 300 MG capsule Commonly known as: NEURONTIN Take 300 mg by mouth 3 (three) times daily. Take 1 tab in morning, 1 tab in evening (around 3 pm) and 2 tab at bedtime   hydrALAZINE 50 MG tablet Commonly known as: APRESOLINE Take 50 mg by mouth 3 (three) times daily.   ketoconazole 2 % cream Commonly known as: NIZORAL Apply 1 application topically as directed.   levothyroxine 50 MCG tablet Commonly known as: SYNTHROID Take 50 mcg by mouth daily.   oxyCODONE 10 mg 12 hr tablet Commonly known as: OXYCONTIN Take 10 mg by mouth every 12 (twelve) hours.   pantoprazole 40 MG tablet Commonly known as: PROTONIX Take 40 mg by mouth 2 (two) times daily.   pravastatin 20 MG tablet Commonly known as: PRAVACHOL Take 20 mg by mouth daily.   predniSONE 10 MG tablet Commonly known as: DELTASONE Four tabs po daily for two more days Start taking on: December 04, 2019   ProAir HFA 108 (90 Base) MCG/ACT inhaler Generic drug: albuterol Inhale 1-2 puffs into the lungs every 6 (six) hours as needed for wheezing or shortness of breath. What changed: Another medication with the same name was added. Make sure you understand how and when to take each.   albuterol (2.5 MG/3ML) 0.083% nebulizer solution Commonly known as: PROVENTIL Take 3 mLs (2.5 mg total) by nebulization every 6 (six) hours as needed for shortness of breath. What changed: You were already taking a medication with the same name, and this prescription was added. Make sure you understand how and when to take each.   Vitamin D (Ergocalciferol) 1.25 MG (50000 UNIT) Caps capsule Commonly known as: DRISDOL Take 50,000 Units by mouth once a week.   zolpidem 5 MG tablet Commonly known as: AMBIEN Take 5 mg  by mouth at bedtime as needed for sleep.            Durable Medical Equipment  (From admission, onward)         Start     Ordered   12/03/19 0954  For home use only DME Nebulizer machine  Once       Question Answer Comment  Patient needs a nebulizer to treat with the following condition COPD exacerbation (HCC)   Length of Need Lifetime      12/03/19 0953           DISCHARGE INSTRUCTIONS:   Follow-up PMD 5 days  If you experience worsening of your admission symptoms, develop shortness of breath, life threatening emergency, suicidal or homicidal thoughts you must seek medical attention immediately by calling 911 or calling your MD immediately  if symptoms less severe.  You Must read complete instructions/literature along with all the possible adverse reactions/side effects for all the Medicines you take and that have been prescribed to  you. Take any new Medicines after you have completely understood and accept all the possible adverse reactions/side effects.   Please note  You were cared for by a hospitalist during your hospital stay. If you have any questions about your discharge medications or the care you received while you were in the hospital after you are discharged, you can call the unit and asked to speak with the hospitalist on call if the hospitalist that took care of you is not available. Once you are discharged, your primary care physician will handle any further medical issues. Please note that NO REFILLS for any discharge medications will be authorized once you are discharged, as it is imperative that you return to your primary care physician (or establish a relationship with a primary care physician if you do not have one) for your aftercare needs so that they can reassess your need for medications and monitor your lab values.    Today   CHIEF COMPLAINT:   Chief Complaint  Patient presents with  . Shortness of Breath    HISTORY OF PRESENT ILLNESS:  Kimberly Mccarthy  is a 64 y.o. female came in with shortness of breath and found to have COPD exacerbation   VITAL SIGNS:  Blood pressure (!) 158/77, pulse 67, temperature 98.2 F (36.8 C), temperature source Oral, resp. rate 18, height 5\' 6"  (1.676 m), weight 125.2 kg, SpO2 97 %.   PHYSICAL EXAMINATION:  GENERAL:  64 y.o.-year-old patient lying in the bed with no acute distress.  EYES: Pupils equal, round, reactive to light and accommodation. No scleral icterus. Extraocular muscles intact.  HEENT: Head atraumatic, normocephalic. Oropharynx and nasopharynx clear.  LUNGS: Normal breath sounds bilaterally, no wheezing, rales,rhonchi or crepitation. No use of accessory muscles of respiration.  CARDIOVASCULAR: S1, S2 normal. No murmurs, rubs, or gallops.  ABDOMEN: Soft, non-tender, non-distended. Bowel sounds present. No organomegaly or mass.  EXTREMITIES: No pedal edema, cyanosis, or clubbing.  NEUROLOGIC: Cranial nerves II through XII are intact. PSYCHIATRIC: The patient is alert and oriented x 3.  SKIN: No obvious rash, lesion, or ulcer.   DATA REVIEW:   CBC Recent Labs  Lab 12/01/19 0053  WBC 5.8  HGB 13.2  HCT 41.9  PLT 332    Chemistries  Recent Labs  Lab 12/02/19 0850  NA 140  K 4.5  CL 101  CO2 27  GLUCOSE 164*  BUN 24*  CREATININE 1.41*  CALCIUM 8.9    Microbiology Results  Results for orders placed or performed during the hospital encounter of 12/01/19  SARS Coronavirus 2 by RT PCR (hospital order, performed in [facility] hospital lab) Nasopharyngeal Nasopharyngeal Swab     Status: None   Collection Time: 12/01/19 12:53 AM   Specimen: Nasopharyngeal Swab  Result Value Ref Range Status   SARS Coronavirus 2 NEGATIVE NEGATIVE Final    Comment: (NOTE) SARS-CoV-2 target nucleic acids are NOT DETECTED.  The SARS-CoV-2 RNA is generally detectable in upper and lower respiratory specimens during the acute phase of infection. The lowest concentration of SARS-CoV-2 viral  copies this assay can detect is 250 copies / mL. A negative result does not preclude SARS-CoV-2 infection and should not be used as the sole basis for treatment or other patient management decisions.  A negative result may occur with improper specimen collection / handling, submission of specimen other than nasopharyngeal swab, presence of viral mutation(s) within the areas targeted by this assay, and inadequate number of viral copies (<250 copies / mL). A negative  result must be combined with clinical observations, patient history, and epidemiological information.  Fact Sheet for Patients:   BoilerBrush.com.cy  Fact Sheet for Healthcare Providers: https://pope.com/  This test is not yet approved or  cleared by the [place] FDA and has been authorized for detection and/or diagnosis of SARS-CoV-2 by FDA under an Emergency Use Authorization (EUA).  This EUA will remain in effect (meaning this test can be used) for the duration of the COVID-19 declaration under Section 564(b)(1) of the Act, 21 U.S.C. section 360bbb-3(b)(1), unless the authorization is terminated or revoked sooner.  Performed at [facility], [address], [place], [place] [address]     Management plans discussed with the patient, and she is in agreement.  Spoke with the patient's sister yesterday about the plan.  CODE STATUS:     Code Status Orders  (From admission, onward)         Start     Ordered   12/01/19 0347  Full code  Continuous        12/01/19 0349        Code Status History    Date Active Date Inactive Code Status Order ID Comments User Context   12/02/2014 2133 12/04/2014 2202 Full Code [id number]  Auburn Bilberry, MD Inpatient   09/05/2014 2316 09/07/2014 1606 Full Code [id number]  Oralia Manis, MD Inpatient   Advance Care Planning Activity      TOTAL TIME TAKING CARE OF THIS PATIENT: 35 minutes.    Alford Highland M.D on  12/03/2019 at 3:17 PM  Between 7am to 6pm - Pager - [phone]  After 6pm go to [email or web address] - Social research officer, government  [facility]  CC: Primary care physician; [facility]

## 2019-12-03 NOTE — TOC Transition Note (Addendum)
Transition of Care [facility]) - CM/SW Discharge Note   Patient Details  Name: Kimberly Mccarthy MRN: [id number] Date of Birth: [birth year]  Transition of Care [facility]) CM/SW Contact:  Verna Czech [place], [place] Phone Number: 12/03/2019, 10:11 AM   Clinical Narrative:    Patient to discharge home today. Pt will need Trilogy for NIV PCO2 @74 .  Confirmed with with [employer] ) that the Trilogy and nebulizer will be delivered to patient's room. He will call this Vaughan Basta with estimated time of arrival.  Education to be provided to  pt on how this portable unit is set up and utilized.   10:45am Return phone call from [employer](Jermaine) who stated that the Respiratory therapist will deliver the Trilogy and Nebulizer to patient's room by 1pm today.   [address], LCSW Transition of Care [phone]          Patient Goals and CMS Choice        Discharge Placement                       Discharge Plan and Services                                     Social Determinants of Health (SDOH) Interventions     Readmission Risk Interventions No flowsheet data found.

## 2019-12-03 NOTE — Progress Notes (Signed)
bipap declined 

## 2020-02-12 ENCOUNTER — Emergency Department: Payer: Medicare Other

## 2020-02-12 ENCOUNTER — Inpatient Hospital Stay: Payer: Medicare Other

## 2020-02-12 ENCOUNTER — Inpatient Hospital Stay (HOSPITAL_COMMUNITY)
Admit: 2020-02-12 | Discharge: 2020-02-12 | Disposition: A | Discharge status: Home / Self Care | Payer: Medicare Other | Attending: Emergency Medicine | Admitting: Emergency Medicine

## 2020-02-12 ENCOUNTER — Inpatient Hospital Stay
Admission: EM | Admit: 2020-02-12 | Discharge: 2020-02-16 | DRG: 208 | Disposition: A | Discharge status: Other Acute Inpatient Hospital | Payer: Medicare Other | Attending: Internal Medicine | Admitting: Internal Medicine

## 2020-02-12 ENCOUNTER — Other Ambulatory Visit: Payer: Self-pay

## 2020-02-12 ENCOUNTER — Encounter: Payer: Self-pay | Admitting: Emergency Medicine

## 2020-02-12 DIAGNOSIS — Z7982 Long term (current) use of aspirin: Secondary | ICD-10-CM

## 2020-02-12 DIAGNOSIS — Z20822 Contact with and (suspected) exposure to covid-19: Secondary | ICD-10-CM | POA: Diagnosis present

## 2020-02-12 DIAGNOSIS — I5030 Unspecified diastolic (congestive) heart failure: Secondary | ICD-10-CM

## 2020-02-12 DIAGNOSIS — E785 Hyperlipidemia, unspecified: Secondary | ICD-10-CM | POA: Diagnosis present

## 2020-02-12 DIAGNOSIS — N170 Acute kidney failure with tubular necrosis: Secondary | ICD-10-CM | POA: Diagnosis not present

## 2020-02-12 DIAGNOSIS — I472 Ventricular tachycardia: Secondary | ICD-10-CM | POA: Diagnosis not present

## 2020-02-12 DIAGNOSIS — E662 Morbid (severe) obesity with alveolar hypoventilation: Secondary | ICD-10-CM | POA: Diagnosis present

## 2020-02-12 DIAGNOSIS — E039 Hypothyroidism, unspecified: Secondary | ICD-10-CM | POA: Diagnosis present

## 2020-02-12 DIAGNOSIS — E871 Hypo-osmolality and hyponatremia: Secondary | ICD-10-CM | POA: Diagnosis not present

## 2020-02-12 DIAGNOSIS — I5033 Acute on chronic diastolic (congestive) heart failure: Secondary | ICD-10-CM | POA: Diagnosis present

## 2020-02-12 DIAGNOSIS — G894 Chronic pain syndrome: Secondary | ICD-10-CM | POA: Diagnosis present

## 2020-02-12 DIAGNOSIS — J441 Chronic obstructive pulmonary disease with (acute) exacerbation: Secondary | ICD-10-CM

## 2020-02-12 DIAGNOSIS — J9621 Acute and chronic respiratory failure with hypoxia: Secondary | ICD-10-CM | POA: Diagnosis present

## 2020-02-12 DIAGNOSIS — Z8673 Personal history of transient ischemic attack (TIA), and cerebral infarction without residual deficits: Secondary | ICD-10-CM

## 2020-02-12 DIAGNOSIS — J9601 Acute respiratory failure with hypoxia: Secondary | ICD-10-CM

## 2020-02-12 DIAGNOSIS — K5900 Constipation, unspecified: Secondary | ICD-10-CM | POA: Diagnosis not present

## 2020-02-12 DIAGNOSIS — Z7951 Long term (current) use of inhaled steroids: Secondary | ICD-10-CM

## 2020-02-12 DIAGNOSIS — N179 Acute kidney failure, unspecified: Secondary | ICD-10-CM | POA: Diagnosis present

## 2020-02-12 DIAGNOSIS — I471 Supraventricular tachycardia: Secondary | ICD-10-CM | POA: Diagnosis not present

## 2020-02-12 DIAGNOSIS — I160 Hypertensive urgency: Secondary | ICD-10-CM | POA: Diagnosis present

## 2020-02-12 DIAGNOSIS — Z6841 Body Mass Index (BMI) 40.0 and over, adult: Secondary | ICD-10-CM

## 2020-02-12 DIAGNOSIS — E872 Acidosis, unspecified: Secondary | ICD-10-CM

## 2020-02-12 DIAGNOSIS — Z79899 Other long term (current) drug therapy: Secondary | ICD-10-CM

## 2020-02-12 DIAGNOSIS — I21A1 Myocardial infarction type 2: Secondary | ICD-10-CM | POA: Diagnosis not present

## 2020-02-12 DIAGNOSIS — Z79891 Long term (current) use of opiate analgesic: Secondary | ICD-10-CM

## 2020-02-12 DIAGNOSIS — R0789 Other chest pain: Secondary | ICD-10-CM

## 2020-02-12 DIAGNOSIS — I517 Cardiomegaly: Secondary | ICD-10-CM

## 2020-02-12 DIAGNOSIS — J9622 Acute and chronic respiratory failure with hypercapnia: Secondary | ICD-10-CM | POA: Diagnosis present

## 2020-02-12 DIAGNOSIS — J9602 Acute respiratory failure with hypercapnia: Secondary | ICD-10-CM

## 2020-02-12 DIAGNOSIS — G928 Other toxic encephalopathy: Secondary | ICD-10-CM | POA: Diagnosis present

## 2020-02-12 DIAGNOSIS — R7989 Other specified abnormal findings of blood chemistry: Secondary | ICD-10-CM

## 2020-02-12 DIAGNOSIS — J9620 Acute and chronic respiratory failure, unspecified whether with hypoxia or hypercapnia: Secondary | ICD-10-CM | POA: Diagnosis not present

## 2020-02-12 DIAGNOSIS — N1831 Chronic kidney disease, stage 3a: Secondary | ICD-10-CM | POA: Diagnosis present

## 2020-02-12 DIAGNOSIS — I130 Hypertensive heart and chronic kidney disease with heart failure and stage 1 through stage 4 chronic kidney disease, or unspecified chronic kidney disease: Secondary | ICD-10-CM | POA: Diagnosis present

## 2020-02-12 DIAGNOSIS — I214 Non-ST elevation (NSTEMI) myocardial infarction: Secondary | ICD-10-CM | POA: Diagnosis not present

## 2020-02-12 DIAGNOSIS — J810 Acute pulmonary edema: Secondary | ICD-10-CM | POA: Diagnosis not present

## 2020-02-12 DIAGNOSIS — I2510 Atherosclerotic heart disease of native coronary artery without angina pectoris: Secondary | ICD-10-CM | POA: Diagnosis not present

## 2020-02-12 DIAGNOSIS — N19 Unspecified kidney failure: Secondary | ICD-10-CM | POA: Diagnosis present

## 2020-02-12 DIAGNOSIS — J9690 Respiratory failure, unspecified, unspecified whether with hypoxia or hypercapnia: Secondary | ICD-10-CM | POA: Diagnosis present

## 2020-02-12 DIAGNOSIS — Z8249 Family history of ischemic heart disease and other diseases of the circulatory system: Secondary | ICD-10-CM

## 2020-02-12 DIAGNOSIS — Z87891 Personal history of nicotine dependence: Secondary | ICD-10-CM

## 2020-02-12 DIAGNOSIS — I7090 Unspecified atherosclerosis: Secondary | ICD-10-CM | POA: Diagnosis not present

## 2020-02-12 DIAGNOSIS — G8929 Other chronic pain: Secondary | ICD-10-CM

## 2020-02-12 DIAGNOSIS — I248 Other forms of acute ischemic heart disease: Secondary | ICD-10-CM | POA: Diagnosis not present

## 2020-02-12 DIAGNOSIS — R079 Chest pain, unspecified: Secondary | ICD-10-CM

## 2020-02-12 DIAGNOSIS — I700 Atherosclerosis of aorta: Secondary | ICD-10-CM | POA: Diagnosis present

## 2020-02-12 DIAGNOSIS — I739 Peripheral vascular disease, unspecified: Secondary | ICD-10-CM | POA: Diagnosis present

## 2020-02-12 DIAGNOSIS — R0602 Shortness of breath: Secondary | ICD-10-CM

## 2020-02-12 DIAGNOSIS — Z79890 Hormone replacement therapy (postmenopausal): Secondary | ICD-10-CM

## 2020-02-12 DIAGNOSIS — I2582 Chronic total occlusion of coronary artery: Secondary | ICD-10-CM | POA: Diagnosis present

## 2020-02-12 DIAGNOSIS — I5031 Acute diastolic (congestive) heart failure: Secondary | ICD-10-CM | POA: Diagnosis not present

## 2020-02-12 DIAGNOSIS — Z8349 Family history of other endocrine, nutritional and metabolic diseases: Secondary | ICD-10-CM

## 2020-02-12 HISTORY — DX: Chronic respiratory failure, unspecified whether with hypoxia or hypercapnia: J96.10

## 2020-02-12 HISTORY — DX: Morbid (severe) obesity due to excess calories: E66.01

## 2020-02-12 HISTORY — DX: Chronic obstructive pulmonary disease, unspecified: J44.9

## 2020-02-12 LAB — HEMOGLOBIN A1C
Hgb A1c MFr Bld: 6.1 % — ABNORMAL HIGH (ref 4.8–5.6)
Mean Plasma Glucose: 128 mg/dL

## 2020-02-12 LAB — URINE DRUG SCREEN, QUALITATIVE (ARMC ONLY)
Amphetamines, Ur Screen: NOT DETECTED
Barbiturates, Ur Screen: NOT DETECTED
Benzodiazepine, Ur Scrn: POSITIVE — AB
Cannabinoid 50 Ng, Ur ~~LOC~~: NOT DETECTED
Cocaine Metabolite,Ur ~~LOC~~: NOT DETECTED
MDMA (Ecstasy)Ur Screen: NOT DETECTED
Methadone Scn, Ur: NOT DETECTED
Opiate, Ur Screen: NOT DETECTED
Phencyclidine (PCP) Ur S: NOT DETECTED
Tricyclic, Ur Screen: NOT DETECTED

## 2020-02-12 LAB — URINALYSIS, COMPLETE (UACMP) WITH MICROSCOPIC
Bacteria, UA: NONE SEEN
Bilirubin Urine: NEGATIVE
Glucose, UA: 150 mg/dL — AB
Hgb urine dipstick: NEGATIVE
Ketones, ur: NEGATIVE mg/dL
Leukocytes,Ua: NEGATIVE
Nitrite: NEGATIVE
Protein, ur: 100 mg/dL — AB
Specific Gravity, Urine: 1.013 (ref 1.005–1.030)
pH: 5 (ref 5.0–8.0)

## 2020-02-12 LAB — COMPREHENSIVE METABOLIC PANEL
ALT: 17 U/L (ref 0–44)
AST: 22 U/L (ref 15–41)
Albumin: 4.2 g/dL (ref 3.5–5.0)
Alkaline Phosphatase: 95 U/L (ref 38–126)
Anion gap: 13 (ref 5–15)
BUN: 20 mg/dL (ref 8–23)
CO2: 24 mmol/L (ref 22–32)
Calcium: 8.8 mg/dL — ABNORMAL LOW (ref 8.9–10.3)
Chloride: 102 mmol/L (ref 98–111)
Creatinine, Ser: 1.38 mg/dL — ABNORMAL HIGH (ref 0.44–1.00)
GFR, Estimated: 41 mL/min — ABNORMAL LOW (ref >60)
Glucose, Bld: 275 mg/dL — ABNORMAL HIGH (ref 70–99)
Potassium: 4 mmol/L (ref 3.5–5.1)
Sodium: 139 mmol/L (ref 135–145)
Total Bilirubin: 0.8 mg/dL (ref 0.3–1.2)
Total Protein: 8 g/dL (ref 6.5–8.1)

## 2020-02-12 LAB — ECHOCARDIOGRAM COMPLETE
AR max vel: 2.26 cm2
AV Area VTI: 2.18 cm2
AV Area mean vel: 2.08 cm2
AV Mean grad: 3 mmHg
AV Peak grad: 6.7 mmHg
Ao pk vel: 1.29 m/s
Area-P 1/2: 4.52 cm2
Height: 66 in
S' Lateral: 3.15 cm
Weight: 4416.25 oz

## 2020-02-12 LAB — CBC WITH DIFFERENTIAL/PLATELET
Abs Immature Granulocytes: 0.03 10*3/uL (ref 0.00–0.07)
Basophils Absolute: 0.1 10*3/uL (ref 0.0–0.1)
Basophils Relative: 1 %
Eosinophils Absolute: 0.2 10*3/uL (ref 0.0–0.5)
Eosinophils Relative: 2 %
HCT: 41.6 % (ref 36.0–46.0)
Hemoglobin: 12.6 g/dL (ref 12.0–15.0)
Immature Granulocytes: 0 %
Lymphocytes Relative: 46 %
Lymphs Abs: 3.6 10*3/uL (ref 0.7–4.0)
MCH: 28.3 pg (ref 26.0–34.0)
MCHC: 30.3 g/dL (ref 30.0–36.0)
MCV: 93.5 fL (ref 80.0–100.0)
Monocytes Absolute: 0.6 10*3/uL (ref 0.1–1.0)
Monocytes Relative: 7 %
Neutro Abs: 3.5 10*3/uL (ref 1.7–7.7)
Neutrophils Relative %: 44 %
Platelets: 340 10*3/uL (ref 150–400)
RBC: 4.45 MIL/uL (ref 3.87–5.11)
RDW: 16.3 % — ABNORMAL HIGH (ref 11.5–15.5)
WBC: 7.9 10*3/uL (ref 4.0–10.5)
nRBC: 0 % (ref 0.0–0.2)

## 2020-02-12 LAB — BLOOD GAS, VENOUS
Acid-base deficit: 0 mmol/L (ref 0.0–2.0)
Bicarbonate: 29.5 mmol/L — ABNORMAL HIGH (ref 20.0–28.0)
O2 Saturation: 89.1 %
Patient temperature: 37
pCO2, Ven: 72 mmHg (ref 44.0–60.0)
pH, Ven: 7.22 — ABNORMAL LOW (ref 7.250–7.430)
pO2, Ven: 68 mmHg — ABNORMAL HIGH (ref 32.0–45.0)

## 2020-02-12 LAB — RESPIRATORY PANEL BY RT PCR (FLU A&B, COVID)
Influenza A by PCR: NEGATIVE
Influenza B by PCR: NEGATIVE
SARS Coronavirus 2 by RT PCR: NEGATIVE

## 2020-02-12 LAB — TROPONIN I (HIGH SENSITIVITY)
Troponin I (High Sensitivity): 13 ng/L (ref <18)
Troponin I (High Sensitivity): 65 ng/L — ABNORMAL HIGH (ref <18)

## 2020-02-12 LAB — BRAIN NATRIURETIC PEPTIDE: B Natriuretic Peptide: 320.8 pg/mL — ABNORMAL HIGH (ref 0.0–100.0)

## 2020-02-12 LAB — GLUCOSE, CAPILLARY
Glucose-Capillary: 121 mg/dL — ABNORMAL HIGH (ref 70–99)
Glucose-Capillary: 127 mg/dL — ABNORMAL HIGH (ref 70–99)
Glucose-Capillary: 132 mg/dL — ABNORMAL HIGH (ref 70–99)
Glucose-Capillary: 137 mg/dL — ABNORMAL HIGH (ref 70–99)
Glucose-Capillary: 243 mg/dL — ABNORMAL HIGH (ref 70–99)

## 2020-02-12 LAB — LACTIC ACID, PLASMA
Lactic Acid, Venous: 4.6 mmol/L (ref 0.5–1.9)
Lactic Acid, Venous: 1.5 mmol/L (ref 0.5–1.9)

## 2020-02-12 LAB — MAGNESIUM: Magnesium: 1.9 mg/dL (ref 1.7–2.4)

## 2020-02-12 LAB — PROCALCITONIN: Procalcitonin: <0.1 ng/mL

## 2020-02-12 MED ORDER — DOCUSATE SODIUM 100 MG PO CAPS: 100.0000 mg | ORAL_CAPSULE | Freq: Two times a day (BID) | ORAL | Status: DC | PRN

## 2020-02-12 MED ORDER — SODIUM CHLORIDE 0.9% FLUSH: 3.0000 mL | INTRAVENOUS | Status: DC | PRN

## 2020-02-12 MED ORDER — FENTANYL CITRATE (PF) 100 MCG/2ML IJ SOLN: 50.0000 ug | INTRAMUSCULAR | Status: DC | PRN

## 2020-02-12 MED ORDER — DOCUSATE SODIUM 50 MG/5ML PO LIQD
100.0000 mg | Freq: Two times a day (BID) | ORAL | Status: DC
  Administered 2020-02-12 – 2020-02-13 (×2): 100 mg
  Filled 2020-02-12 (×5): qty 10

## 2020-02-12 MED ORDER — METHYLPREDNISOLONE SODIUM SUCC 125 MG IJ SOLR
125.0000 mg | Freq: Once | INTRAMUSCULAR | Status: AC
  Administered 2020-02-12: 125 mg via INTRAVENOUS
  Filled 2020-02-12: qty 2

## 2020-02-12 MED ORDER — PROPOFOL 1000 MG/100ML IV EMUL
5.0000 ug/kg/min | INTRAVENOUS | Status: DC
  Administered 2020-02-12 (×2): 50 ug/kg/min via INTRAVENOUS
  Administered 2020-02-12: 40 ug/kg/min via INTRAVENOUS
  Administered 2020-02-12: 5 ug/kg/min via INTRAVENOUS
  Administered 2020-02-13: 70 ug/kg/min via INTRAVENOUS
  Administered 2020-02-13: 50 ug/kg/min via INTRAVENOUS
  Administered 2020-02-13: 60 ug/kg/min via INTRAVENOUS
  Administered 2020-02-13 (×2): 50 ug/kg/min via INTRAVENOUS
  Filled 2020-02-12 (×10): qty 100

## 2020-02-12 MED ORDER — ONDANSETRON HCL 4 MG/2ML IJ SOLN
4.0000 mg | Freq: Four times a day (QID) | INTRAMUSCULAR | Status: DC | PRN
  Administered 2020-02-13 – 2020-02-16 (×4): 4 mg via INTRAVENOUS
  Filled 2020-02-12 (×4): qty 2

## 2020-02-12 MED ORDER — FAMOTIDINE IN NACL 20-0.9 MG/50ML-% IV SOLN: 20.0000 mg | Freq: Two times a day (BID) | INTRAVENOUS | Status: DC

## 2020-02-12 MED ORDER — ACETAMINOPHEN 160 MG/5ML PO SOLN
650.0000 mg | ORAL | Status: DC | PRN
  Filled 2020-02-12: qty 20.3

## 2020-02-12 MED ORDER — DOCUSATE SODIUM 50 MG/5ML PO LIQD
100.0000 mg | Freq: Two times a day (BID) | ORAL | Status: DC
  Administered 2020-02-12: 100 mg via ORAL
  Filled 2020-02-12 (×3): qty 10

## 2020-02-12 MED ORDER — POLYETHYLENE GLYCOL 3350 17 G PO PACK
17.0000 g | PACK | Freq: Every day | ORAL | Status: DC
  Administered 2020-02-13: 17 g
  Filled 2020-02-12: qty 1

## 2020-02-12 MED ORDER — IOHEXOL 350 MG/ML SOLN
75.0000 mL | Freq: Once | INTRAVENOUS | Status: AC | PRN
  Administered 2020-02-12: 75 mL via INTRAVENOUS

## 2020-02-12 MED ORDER — SODIUM CHLORIDE 0.9 % IV SOLN
500.0000 mg | Freq: Once | INTRAVENOUS | Status: AC
  Administered 2020-02-12: 500 mg via INTRAVENOUS
  Filled 2020-02-12: qty 500

## 2020-02-12 MED ORDER — FENTANYL CITRATE (PF) 100 MCG/2ML IJ SOLN
100.0000 ug | INTRAMUSCULAR | Status: DC | PRN
  Administered 2020-02-12 (×2): 100 ug via INTRAVENOUS
  Filled 2020-02-12 (×5): qty 2

## 2020-02-12 MED ORDER — POLYETHYLENE GLYCOL 3350 17 G PO PACK
17.0000 g | PACK | Freq: Every day | ORAL | Status: DC | PRN
  Administered 2020-02-14: 17 g
  Filled 2020-02-12: qty 1

## 2020-02-12 MED ORDER — METHYLPREDNISOLONE SODIUM SUCC 40 MG IJ SOLR
40.0000 mg | Freq: Two times a day (BID) | INTRAMUSCULAR | Status: DC
  Administered 2020-02-13 – 2020-02-14 (×3): 40 mg via INTRAVENOUS
  Filled 2020-02-12 (×3): qty 1

## 2020-02-12 MED ORDER — SODIUM CHLORIDE 0.9% FLUSH
3.0000 mL | Freq: Two times a day (BID) | INTRAVENOUS | Status: DC
  Administered 2020-02-12 – 2020-02-15 (×6): 3 mL via INTRAVENOUS

## 2020-02-12 MED ORDER — SODIUM CHLORIDE 0.9 % IV SOLN: 250.0000 mL | INTRAVENOUS | Status: DC | PRN

## 2020-02-12 MED ORDER — ENOXAPARIN SODIUM 40 MG/0.4ML ~~LOC~~ SOLN: 40.0000 mg | SUBCUTANEOUS | Status: DC

## 2020-02-12 MED ORDER — SODIUM CHLORIDE 0.9 % IV SOLN
1.0000 g | Freq: Once | INTRAVENOUS | Status: AC
  Administered 2020-02-12: 1 g via INTRAVENOUS
  Filled 2020-02-12: qty 10

## 2020-02-12 MED ORDER — FAMOTIDINE IN NACL 20-0.9 MG/50ML-% IV SOLN
20.0000 mg | Freq: Two times a day (BID) | INTRAVENOUS | Status: DC
  Administered 2020-02-12 – 2020-02-14 (×5): 20 mg via INTRAVENOUS
  Filled 2020-02-12 (×5): qty 50

## 2020-02-12 MED ORDER — CHLORHEXIDINE GLUCONATE 0.12% ORAL RINSE (MEDLINE KIT)
15.0000 mL | Freq: Two times a day (BID) | OROMUCOSAL | Status: DC
  Administered 2020-02-12 – 2020-02-13 (×3): 15 mL via OROMUCOSAL

## 2020-02-12 MED ORDER — FENTANYL CITRATE (PF) 100 MCG/2ML IJ SOLN
100.0000 ug | INTRAMUSCULAR | Status: DC | PRN
  Administered 2020-02-12 – 2020-02-13 (×5): 100 ug via INTRAVENOUS
  Filled 2020-02-12 (×4): qty 2

## 2020-02-12 MED ORDER — ENOXAPARIN SODIUM 80 MG/0.8ML ~~LOC~~ SOLN
0.5000 mg/kg | SUBCUTANEOUS | Status: DC
  Administered 2020-02-12 – 2020-02-14 (×3): 62.5 mg via SUBCUTANEOUS
  Filled 2020-02-12 (×4): qty 0.8

## 2020-02-12 MED ORDER — POLYETHYLENE GLYCOL 3350 17 G PO PACK: 17.0000 g | PACK | Freq: Every day | ORAL | Status: DC | PRN

## 2020-02-12 MED ORDER — LACTATED RINGERS IV BOLUS: 1000.0000 mL | Freq: Once | INTRAVENOUS | Status: DC

## 2020-02-12 MED ORDER — MIDAZOLAM HCL 2 MG/2ML IJ SOLN
INTRAMUSCULAR | Status: AC
  Administered 2020-02-12: 4 mg via INTRAVENOUS
  Filled 2020-02-12: qty 2

## 2020-02-12 MED ORDER — FENTANYL CITRATE (PF) 100 MCG/2ML IJ SOLN
50.0000 ug | INTRAMUSCULAR | Status: DC | PRN
  Filled 2020-02-12: qty 2

## 2020-02-12 MED ORDER — INSULIN ASPART 100 UNIT/ML ~~LOC~~ SOLN
0.0000 [IU] | SUBCUTANEOUS | Status: DC
  Administered 2020-02-12 – 2020-02-15 (×13): 2 [IU] via SUBCUTANEOUS
  Filled 2020-02-12 (×13): qty 1

## 2020-02-12 MED ORDER — ORAL CARE MOUTH RINSE
15.0000 mL | OROMUCOSAL | Status: DC
  Administered 2020-02-12 – 2020-02-14 (×13): 15 mL via OROMUCOSAL

## 2020-02-12 MED ORDER — MIDAZOLAM HCL 2 MG/2ML IJ SOLN
4.0000 mg | Freq: Once | INTRAMUSCULAR | Status: AC | PRN
  Filled 2020-02-12: qty 4

## 2020-02-12 MED ORDER — IPRATROPIUM-ALBUTEROL 0.5-2.5 (3) MG/3ML IN SOLN
9.0000 mL | Freq: Once | RESPIRATORY_TRACT | Status: AC
  Administered 2020-02-12: 9 mL via RESPIRATORY_TRACT
  Filled 2020-02-12: qty 9

## 2020-02-12 MED ORDER — LACTATED RINGERS IV BOLUS
500.0000 mL | Freq: Once | INTRAVENOUS | Status: AC
  Administered 2020-02-12: 500 mL via INTRAVENOUS

## 2020-02-12 MED ORDER — POLYETHYLENE GLYCOL 3350 17 G PO PACK
17.0000 g | PACK | Freq: Every day | ORAL | Status: DC
  Administered 2020-02-12: 17 g via ORAL
  Filled 2020-02-12: qty 1

## 2020-02-12 MED ORDER — ACETAMINOPHEN 325 MG PO TABS: 650.0000 mg | ORAL_TABLET | ORAL | Status: DC | PRN

## 2020-02-12 NOTE — ED Notes (Signed)
30 etomidate given now by [employer]

## 2020-02-12 NOTE — ED Notes (Signed)
Intubation completed now by Katrinka Blazing MD

## 2020-02-12 NOTE — Progress Notes (Signed)
Vent alarming and patient reaching up for ETT. Patient asked if she was in plain and she shook her head yes.  Patient asked if she was hot and she shook her head yes. Patient with bair hugger in place.  Skin feels warm to touch. Unable to get oral or axillary temp    Bilateral mitts applied.  Fentanyl 100 mcg administered.

## 2020-02-12 NOTE — ED Notes (Addendum)
Pt moving arm towards tube. Profopol orders placed by ADmit MD and will start at this time

## 2020-02-12 NOTE — ED Notes (Signed)
40 Rocc given now by [employer]

## 2020-02-12 NOTE — ED Notes (Signed)
Pt back from CT

## 2020-02-12 NOTE — Progress Notes (Signed)
CODE SEPSIS - PHARMACY COMMUNICATION  **Broad Spectrum Antibiotics should be administered within 1 hour of Sepsis diagnosis**  Time Code Sepsis Called/Page Received: 02/12/20 at 1030  Antibiotics Ordered: CTX/Azith  Time of 1st antibiotic administration: 1034  Additional action taken by pharmacy:    If necessary, Name of Provider/Nurse Contacted:      Angelique Blonder ,PharmD Clinical Pharmacist  02/12/2020  11:09 AM

## 2020-02-12 NOTE — Progress Notes (Signed)
*  PRELIMINARY RESULTS* Echocardiogram 2D Echocardiogram has been performed.  Kimberly Mccarthy 02/12/2020, 1:23 PM

## 2020-02-12 NOTE — H&P (Signed)
Name: Kimberly Mccarthy MRN: [id number] DOB: January 06, 1956     CONSULTATION DATE: 02/12/2020  REFERRING MD :  Katrinka Blazing  CHIEF COMPLAINT: resp failure  STUDIES:  CT chest b/l Interstitial infiltrates RLL superior segment mass like opacity, ovoid loculated fluid collection.   HISTORY OF PRESENT ILLNESS:   64 y.o. female with a past medical history of HTN, HPL, chronic pain on oxycodone, hypothyroidism, CKD, PVD, CVA,  +chronic hypoxic respiratory failure 2/ COPD on BiPAP at night at home who presents via EMS after they were called out for respiratory distress.    Per EMS patient had agonal respirations on arrival and was unable provide any history.   They provided bag-valve-mask ventilation in route.      Shortly after patient arrival sister at bedside who was able to state that the patient does not wear oxygen normally during the day only BiPAP at night and that this morning patient was complaining of severe shortness of breath and seem to be getting more more short of breath until EMS was called.  Patient is otherwise not been complaining of any other acute sick symptoms per sister.  Patient with progressive resp failure and distress Patient was intubated emergently  CTA did NOT reveal PE but shows interstitial edema, and what seem to be loculated effusions on RT side  PAST MEDICAL HISTORY :   has a past medical history of Accelerated hypertension (09/05/2014), Anemia, Anemia, Arterial disease (HCC) (01/11/2014), AVM (arteriovenous malformation), Carotid artery disease (HCC) (10/31/2015), Carotid artery stenosis, Critical ischemia of lower extremity (HCC) (07/18/2014), H/O blood clots, Hyperkalemia, Hypertension, Hypothyroidism (09/05/2014), PVD (peripheral vascular disease) (HCC), Renal failure, Stroke (HCC), and Thyroid disease.  has a past surgical history that includes left foot surgery (02/2014); Leg Surgery (Left); and Femoral endarterectomy (Right). Prior to Admission medications     Medication Sig Start Date End Date Taking? Authorizing Provider  albuterol (PROVENTIL) (2.5 MG/3ML) 0.083% nebulizer solution Take 3 mLs (2.5 mg total) by nebulization every 6 (six) hours as needed for shortness of breath. 12/03/19  Yes Wieting, Richard, MD  amLODipine (NORVASC) 10 MG tablet Take 10 mg by mouth daily.   Yes [provider]  aspirin EC 81 MG tablet Take 81 mg by mouth daily.   Yes [provider]  carvedilol (COREG) 25 MG tablet Take 25 mg by mouth 2 (two) times daily with a meal.   Yes [provider]  cloNIDine (CATAPRES) 0.3 MG tablet Take 0.3 mg by mouth 2 (two) times daily.   Yes [provider]  cyclobenzaprine (FLEXERIL) 10 MG tablet Take 10 mg by mouth at bedtime as needed for muscle spasms.  11/08/19  Yes [provider]  docusate sodium (COLACE) 100 MG capsule Take 100 mg by mouth 2 (two) times daily.   Yes [provider]  feeding supplement, ENSURE ENLIVE, (ENSURE ENLIVE) LIQD Take 237 mLs by mouth 2 (two) times daily between meals. 12/03/19  Yes Wieting, Richard, MD  ferrous sulfate 325 (65 FE) MG tablet Take 325 mg by mouth 3 (three) times daily with meals.    Yes [provider]  fluticasone-salmeterol (ADVAIR HFA) 115-21 MCG/ACT inhaler Inhale 2 puffs into the lungs 2 (two) times daily. 12/03/19  Yes Wieting, Richard, MD  gabapentin (NEURONTIN) 300 MG capsule Take 300 mg by mouth See admin instructions. Take 1 capsule ( ) by mouth every morning, 1 capsule ( ) every afternoon and take 2 capsules ( ) by mouth every night at bedtime   Yes [provider]  hydrALAZINE (APRESOLINE) 50 MG tablet Take 50 mg by mouth 3 (three) times daily. 05/01/15  Yes [provider]  ketoconazole (NIZORAL) 2 % cream Apply 1 application topically as directed.   Yes [provider]  levothyroxine (SYNTHROID, LEVOTHROID) 50 MCG tablet Take 50 mcg by mouth daily.   Yes [provider]   oxyCODONE (OXYCONTIN) 10 mg 12 hr tablet Take 10 mg by mouth every 12 (twelve) hours.    Yes [provider]  pantoprazole (PROTONIX) 40 MG tablet Take 40 mg by mouth 2 (two) times daily.   Yes [provider]  pravastatin (PRAVACHOL) 20 MG tablet Take 20 mg by mouth daily. 05/01/15  Yes [provider]  PROAIR HFA 108 (90 Base) MCG/ACT inhaler Inhale 1-2 puffs into the lungs every 6 (six) hours as needed for wheezing or shortness of breath.    Yes [provider]  Vitamin D, Ergocalciferol, (DRISDOL) 1.25 MG (50000 UNIT) CAPS capsule Take 50,000 Units by mouth once a week. 07/05/19  Yes [provider]  zolpidem (AMBIEN) 5 MG tablet Take 5 mg by mouth at bedtime as needed for sleep.  11/08/19  Yes [provider]   No Known Allergies  FAMILY HISTORY:  family history includes Diabetes in her mother; Hypertension in her mother and sister; Thyroid disease in her sister. SOCIAL HISTORY:  reports that she has quit smoking. Her smoking use included cigarettes. She has a 6.60 pack-year smoking history. She has never used smokeless tobacco. She reports that she does not drink alcohol and does not use drugs.  REVIEW OF SYSTEMS:   Unable to obtain due to critical illness      Estimated body mass index is 44.55 kg/m as calculated from the following:   Height as of this encounter: 5\' 6"  (1.676 m).   Weight as of this encounter: 125.2 kg.    VITAL SIGNS: Temp:  [93.4 F (34.1 C)-97.9 F (36.6 C)] 93.4 F (34.1 C) (10/18 1115) Pulse Rate:  [81-103] 103 (10/18 1115) Resp:  [16-29] 21 (10/18 1115) BP: (146-184)/(72-169) 179/114 (10/18 1045) SpO2:  [89 %-100 %] 89 % (10/18 1115) FiO2 (%):  [40 %] 40 % (10/18 0920) Weight:  [125.2 kg] 125.2 kg (10/18 0907)   No intake/output data recorded. Total I/O In: 300 [IV Piggyback:300] Out: 25 [Urine:25]   SpO2: (!) 89 % FiO2 (%): 40 %   Physical Examination:  GENERAL:critically ill  appearing, +resp distress NECK: Supple. No JVD.  PULMONARY: +rhonchi, +wheezing CARDIOVASCULAR: S1 and S2. Regular rate and rhythm. No murmurs, rubs, or gallops.  GASTROINTESTINAL: Soft, nontender, -distended.  Positive bowel sounds.  MUSCULOSKELETAL: No swelling, clubbing, or edema.  NEUROLOGIC: obtunded SKIN:intact,warm,dry   MEDICATIONS: I have reviewed all medications and confirmed regimen as documented   CULTURE RESULTS   Recent Results (from the past 240 hour(s))  Respiratory Panel by RT PCR (Flu A&B, Covid) - Nasopharyngeal Swab     Status: None   Collection Time: 02/12/20  9:16 AM   Specimen: Nasopharyngeal Swab  Result Value Ref Range Status   SARS Coronavirus 2 by RT PCR NEGATIVE NEGATIVE Final    Comment: (NOTE) SARS-CoV-2 target nucleic acids are NOT DETECTED.  The SARS-CoV-2 RNA is generally detectable in upper respiratoy specimens during the acute phase of infection. The lowest concentration of SARS-CoV-2 viral copies this assay can detect is 131 copies/mL. A negative result does not preclude SARS-Cov-2 infection and should not be used as the sole basis for treatment or other patient  management decisions. A negative result may occur with  improper specimen collection/handling, submission of specimen other than nasopharyngeal swab, presence of viral mutation(s) within the areas targeted by this assay, and inadequate number of viral copies (<131 copies/mL). A negative result must be combined with clinical observations, patient history, and epidemiological information. The expected result is Negative.  Fact Sheet for Patients:  https://www.moore.com/https://www.fda.gov/media/142436/download  Fact Sheet for Healthcare Providers:  https://www.young.biz/https://www.fda.gov/media/142435/download  This test is no t yet approved or cleared by the [place] FDA and  has been authorized for detection and/or diagnosis of SARS-CoV-2 by FDA under an Emergency Use Authorization (EUA). This EUA will remain  in  effect (meaning this test can be used) for the duration of the COVID-19 declaration under Section 564(b)(1) of the Act, 21 U.S.C. section 360bbb-3(b)(1), unless the authorization is terminated or revoked sooner.     Influenza A by PCR NEGATIVE NEGATIVE Final   Influenza B by PCR NEGATIVE NEGATIVE Final    Comment: (NOTE) The Xpert Xpress SARS-CoV-2/FLU/RSV assay is intended as an aid in  the diagnosis of influenza from Nasopharyngeal swab specimens and  should not be used as a sole basis for treatment. Nasal washings and  aspirates are unacceptable for Xpert Xpress SARS-CoV-2/FLU/RSV  testing.  Fact Sheet for Patients: https://www.moore.com/https://www.fda.gov/media/142436/download  Fact Sheet for Healthcare Providers: https://www.young.biz/https://www.fda.gov/media/142435/download  This test is not yet approved or cleared by the [place] FDA and  has been authorized for detection and/or diagnosis of SARS-CoV-2 by  FDA under an Emergency Use Authorization (EUA). This EUA will remain  in effect (meaning this test can be used) for the duration of the  Covid-19 declaration under Section 564(b)(1) of the Act, 21  U.S.C. section 360bbb-3(b)(1), unless the authorization is  terminated or revoked. Performed at [facility], [address] [place], [place] [address]           IMAGING    DG Abdomen 1 View  Result Date: 02/12/2020 CLINICAL DATA:  Intubation and orogastric tube placement EXAM: ABDOMEN - 1 VIEW COMPARISON:  09/05/2014 FINDINGS: The enteric tube tip overlaps the stomach. Calcified uterine fibroid and atherosclerotic plaque. Bowel gas pattern is normal IMPRESSION: The enteric tube tip and side-port are at the stomach. Electronically Signed   By: Marnee SpringJonathon  Watts M.D.   On: 02/12/2020 09:43   CT HEAD WO CONTRAST  Result Date: 02/12/2020 CLINICAL DATA:  Cerebral hemorrhage suspected. EXAM: CT HEAD WITHOUT CONTRAST TECHNIQUE: Contiguous axial images were obtained from the base of the skull through  the vertex without intravenous contrast. COMPARISON:  None. FINDINGS: Brain: No evidence of acute large vascular territory infarction, hemorrhage, hydrocephalus, extra-axial collection or mass lesion/mass effect. Calcification along the falx. Mild diffuse cerebral atrophy. Vascular: Calcific atherosclerosis. Skull: No evidence of acute fracture. Sinuses/Orbits: Scattered ethmoid air cell fluid and mucosal thickening. No evidence of acute orbital abnormality. Other: Fluid in the incompletely imaged pharynx. IMPRESSION: No evidence of acute intracranial abnormality. Electronically Signed   By: Feliberto HartsFrederick S Jones MD   On: 02/12/2020 12:36   CT Angio Chest PE W and/or Wo Contrast  Result Date: 02/12/2020 CLINICAL DATA:  Patient found unresponsive today with agonal breathing. EXAM: CT ANGIOGRAPHY CHEST WITH CONTRAST TECHNIQUE: Multidetector CT imaging of the chest was performed using the standard protocol during bolus administration of intravenous contrast. Multiplanar CT image reconstructions and MIPs were obtained to evaluate the vascular anatomy. CONTRAST:  75 mL OMNIPAQUE IOHEXOL 350 MG/ML SOLN COMPARISON:  CT chest 12/01/2019. Single-view of the chest 12/01/2019 and 02/12/2020.  FINDINGS: Cardiovascular: The exam is somewhat limited by patient motion. No pulmonary embolus is identified. Mild cardiomegaly. No pericardial effusion. Calcific aortic and coronary atherosclerosis. Mediastinum/Nodes: No enlarged mediastinal, hilar, or axillary lymph nodes. Thyroid gland, trachea, and esophagus demonstrate no significant findings. Endotracheal tube noted. Lungs/Pleura: Small bilateral pleural effusions. The lungs are emphysematous. Hazy bilateral airspace disease is seen and there is interlobular septal thickening. Changes are diffuse. Upper Abdomen: NG tube is seen in the stomach. Musculoskeletal: No acute or focal abnormality. Review of the MIP images confirms the above findings. IMPRESSION: No pulmonary embolus is  identified although the exam is somewhat degraded by patient motion. Interlobular septal thickening and diffuse hazy bilateral airspace disease most consistent with pulmonary edema in this patient with cardiomegaly and small pleural effusions. Aortic Atherosclerosis (ICD10-I70.0) and Emphysema (ICD10-J43.9). Electronically Signed   By: Drusilla Kanner M.D.   On: 02/12/2020 10:17   DG Chest Portable 1 View  Result Date: 02/12/2020 CLINICAL DATA:  Post intubation and OG placement. Unresponsive upon arrival. EXAM: PORTABLE CHEST 1 VIEW COMPARISON:  12/01/2019 FINDINGS: Endotracheal tube tip is approximately 4 cm above the carina. Orogastric tube courses below the diaphragm outside the field of view. Mild diffuse interstitial prominence. Ill-defined right perihilar opacities. Cardiomediastinal silhouette is within normal limits. No visible pneumothorax or pleural effusions on this limited supine portable radiograph. No evidence of acute osseous abnormality. IMPRESSION: 1. Endotracheal tube tip is approximately 4 cm above carina. 2. Orogastric tube courses below the diaphragm outside the field of view. 3. Mild diffuse interstitial prominence and ill-defined right perihilar opacities, which may represent mild pulmonary edema (favored), aspiration or pneumonia. Electronically Signed   By: Feliberto Harts MD   On: 02/12/2020 09:43   CBC    Component Value Date/Time   WBC 7.9 02/12/2020 0916   RBC 4.45 02/12/2020 0916   HGB 12.6 02/12/2020 0916   HGB 14.7 04/27/2014 0907   HCT 41.6 02/12/2020 0916   HCT 46.7 04/27/2014 0907   PLT 340 02/12/2020 0916   PLT 419 04/27/2014 0907   MCV 93.5 02/12/2020 0916   MCV 88 04/27/2014 0907   MCH 28.3 02/12/2020 0916   MCHC 30.3 02/12/2020 0916   RDW 16.3 (H) 02/12/2020 0916   RDW 18.1 (H) 04/27/2014 0907   LYMPHSABS 3.6 02/12/2020 0916   LYMPHSABS 1.4 03/19/2014 0401   MONOABS 0.6 02/12/2020 0916   MONOABS 0.8 03/19/2014 0401   EOSABS 0.2 02/12/2020 0916    EOSABS 0.1 03/19/2014 0401   BASOSABS 0.1 02/12/2020 0916   BASOSABS 0.1 03/19/2014 0401   CMP Latest Ref Rng & Units 02/12/2020 12/02/2019 12/01/2019  Glucose 70 - 99 mg/dL 026(V) 785(Y) 91  BUN 8 - 23 mg/dL 20 85(O) 21  Creatinine 0.44 - 1.00 mg/dL 2.77(A) 1.28(N) 8.67(E)  Sodium 135 - 145 mmol/L 139 140 144  Potassium 3.5 - 5.1 mmol/L 4.0 4.5 4.1  Chloride 98 - 111 mmol/L 102 101 102  CO2 22 - 32 mmol/L 24 27 33(H)  Calcium 8.9 - 10.3 mg/dL 7.2(C) 8.9 9.4(B)  Total Protein 6.5 - 8.1 g/dL 8.0 - -  Total Bilirubin 0.3 - 1.2 mg/dL 0.8 - -  Alkaline Phos 38 - 126 U/L 95 - -  AST 15 - 41 U/L 22 - -  ALT 0 - 44 U/L 17 - -     Nutrition Status:           Indwelling Urinary Catheter continued, requirement due to   Reason to continue Indwelling Urinary Catheter  strict Intake/Output monitoring for hemodynamic instability         Ventilator continued, requirement due to severe respiratory failure   Ventilator Sedation RASS 0 to -2      ASSESSMENT AND PLAN SYNOPSIS  64 yo morbidly obese AAF with h/o COPD and OSA with acute progressive hypoxic resp failure c/w acute systolic heart failure  Severe ACUTE Hypoxic and Hypercapnic Respiratory Failure -continue Full MV support -continue Bronchodilator Therapy -Wean Fio2 and PEEP as tolerated -VAP/VENT bundle implementation  ACUTE SYSTOLIC CARDIAC FAILURE- ECHO PENDING -oxygen as needed -Lasix as tolerated Check UDS  SEVERE COPD EXACERBATION -continue IV steroids as prescribed -continue NEB THERAPY as prescribed -morphine as needed -wean fio2 as needed and tolerated No signs of infection, afebrile and PROC NEG    Morbid obesity, possible OSA.   Will certainly impact respiratory mechanics, ventilator weaning Suspect will need to consider additional PEEP   ACUTE KIDNEY INJURY/Renal Failure -continue Foley Catheter-assess need -Avoid nephrotoxic agents -Follow urine output, BMP -Ensure adequate renal perfusion,  optimize oxygenation -Renal dose medications     NEUROLOGY Acute toxic metabolic encephalopathy, need for sedation Goal RASS -2 to -3 CT head NEG SAT/SBT in 24-48 hrs  CARDIAC ICU monitoring  GI GI PROPHYLAXIS as indicated  NUTRITIONAL STATUS DIET-->TF's as tolerated Constipation protocol as indicated   ENDO - will use ICU hypoglycemic\Hyperglycemia protocol if needed    ELECTROLYTES -follow labs as needed -replace as needed -pharmacy consultation and following    DVT/GI PRX ordered and assessed TRANSFUSIONS AS NEEDED MONITOR FSBS I Assessed the need for Labs I Assessed the need for Foley I Assessed the need for Central Venous Line Family Discussion when available I Assessed the need for Mobilization I made an Assessment of medications to be adjusted accordingly Safety Risk assessment Completed  CASE DISCUSSED IN MULTIDISCIPLINARY ROUNDS WITH ICU TEAM   Critical Care Time devoted to patient care services described in this note is 78 minutes.   Overall, patient is critically ill, prognosis is guarded.  Patient with Multiorgan failure and at high risk for cardiac arrest and death.   Will update family when available  Lucie Leather, M.D.  [facility]  Medical Director [facility] Medical Director [facility] Cardio-Pulmonary Department

## 2020-02-12 NOTE — ED Notes (Addendum)
1L IVF bolus started now

## 2020-02-12 NOTE — ED Notes (Signed)
Pt continues to move and unable to redirect. Medication changed to 50 at this time

## 2020-02-12 NOTE — ED Triage Notes (Addendum)
Pt arrived via [facility]. Pt was unresponsive upon arrival being bagged by EMS.   Per EMS, pt was found unresponsive on the couch at home, agonal breathing. Pt O2 was 46% RA and ETCO2 was 17 at time found. EMS unable to obtain BP  Per EMS, pt has hx of COPD and stroke.

## 2020-02-12 NOTE — ED Notes (Signed)
Pt in CT at this time. EDP Katrinka Blazing called to see about getting CT head, per MD no order at this time.

## 2020-02-12 NOTE — ED Notes (Signed)
Pt flailing arms and trying to sit up at this time. Pt stated "Let me up, I need to sit up, y'all aren't doing anything" repeatedly at this time.   EDP at bedside, pt suctioned and preparation for intubation started at this time

## 2020-02-12 NOTE — ED Notes (Signed)
Pt taken to CT, of fent given at this time in hallway to CT scan

## 2020-02-12 NOTE — ED Notes (Signed)
Intubation started  now by Katrinka Blazing MD

## 2020-02-12 NOTE — ED Notes (Signed)
Bolus 20 given by RN Pattricia Boss at this time.

## 2020-02-12 NOTE — ED Notes (Signed)
MD Katrinka Blazing RT, Sam RN, this RN at bedside at this time.

## 2020-02-12 NOTE — ED Provider Notes (Signed)
[facility] Emergency Department Provider Note  ____________________________________________   First MD Initiated Contact with Patient 02/12/20 [phone]     (approximate)  I have reviewed the triage vital signs and the nursing notes.   HISTORY  Chief Complaint unresponsive   HPI Kimberly Mccarthy is a 64 y.o. female with a past medical history of HTN, HPL, chronic pain on oxycodone, hypothyroidism, CKD, PVD, CVA, chronic hypoxic respiratory failure 2/ COPD on BiPAP at night at home who presents via EMS after they were called out for respiratory distress.  Per EMS patient had agonal respirations on arrival and was unable provide any history.  They provided bag-valve-mask ventilation in route.  Nasopharyngeal airway was placed.  Patient unable to write any history on arrival secondary to significant respiratory distress is only able to state her name and saying "I cannot breathe I cannot breathe".    Shortly after patient arrival sister at bedside who was able to state that the patient does not wear oxygen normally during the day only BiPAP at night and that this morning patient was complaining of severe shortness of breath and seem to be getting more more short of breath until EMS was called.  Patient is otherwise not been complaining of any other acute sick symptoms per sister.      Past Medical History:  Diagnosis Date  . Accelerated hypertension 09/05/2014  . Anemia   . Anemia   . Arterial disease (HCC) 01/11/2014  . AVM (arteriovenous malformation)   . Carotid artery disease (HCC) 10/31/2015  . Carotid artery stenosis   . Critical ischemia of lower extremity (HCC) 07/18/2014  . H/O blood clots   . Hyperkalemia   . Hypertension   . Hypothyroidism 09/05/2014  . PVD (peripheral vascular disease) (HCC)   . Renal failure   . Stroke [facility])    may 2015  . Thyroid disease     Patient Active Problem List   Diagnosis Date Noted  . Renal failure 02/12/2020  .  Chronic kidney disease, stage 3b (HCC) 12/01/2019  . Acute respiratory failure with hypoxia (HCC) 12/01/2019  . COPD with acute bronchitis (HCC) 12/01/2019  . COPD with acute exacerbation (HCC) 12/01/2019  . Acute respiratory failure with hypoxia and hypercapnia (HCC) 12/01/2019  . Acute on chronic respiratory failure with hypoxia and hypercapnia (HCC)   . Other chronic pain   . Carotid artery disease (HCC) 10/31/2015  . Pain in both lower legs 08/30/2015  . CRPS (complex regional pain syndrome) type I of lower limb 08/30/2015  . Intermittent claudication (HCC) 01/03/2015  . Chest pain 12/04/2014  . Acute gastroenteritis 12/04/2014  . Elevated liver enzymes   . Abdominal pain, epigastric   . HTN (hypertension), malignant 12/02/2014  . Accelerated hypertension 09/05/2014  . Hypertension 09/05/2014  . Gastritis without bleeding 09/05/2014  . Elevated troponin 09/05/2014  . AKI (acute kidney injury) (HCC) 09/05/2014  . Hypothyroidism 09/05/2014  . Critical ischemia of lower extremity (HCC) 07/18/2014  . High potassium 02/03/2014  . Arterial disease (HCC) 01/11/2014  . Acid reflux 11/23/2013  . Cerebrovascular accident, old 11/23/2013  . Chronic kidney disease 08/02/2013    Past Surgical History:  Procedure Laterality Date  . FEMORAL ENDARTERECTOMY Right   . left foot surgery  02/2014  . LEG SURGERY Left     Prior to Admission medications   Medication Sig Start Date End Date Taking? Authorizing Provider  amLODipine (NORVASC) 10 MG tablet Take 10 mg by mouth daily.  Yes [provider]  carvedilol (COREG) 25 MG tablet Take 25 mg by mouth 2 (two) times daily with a meal.   Yes [provider]  cloNIDine (CATAPRES) 0.3 MG tablet Take 0.3 mg by mouth 2 (two) times daily.   Yes [provider]  cyclobenzaprine (FLEXERIL) 10 MG tablet Take 10 mg by mouth at bedtime as needed for muscle spasms.  11/08/19  Yes [provider]  gabapentin (NEURONTIN)  300 MG capsule Take 300 mg by mouth See admin instructions. Take 1 capsule ( ) by mouth every morning, 1 capsule ( ) every afternoon and take 2 capsules ( ) by mouth every night at bedtime   Yes [provider]  hydrALAZINE (APRESOLINE) 50 MG tablet Take 50 mg by mouth 3 (three) times daily. 05/01/15  Yes [provider]  levothyroxine (SYNTHROID, LEVOTHROID) 50 MCG tablet Take 50 mcg by mouth daily.   Yes [provider]  oxyCODONE (OXYCONTIN) 10 mg 12 hr tablet Take 10 mg by mouth every 12 (twelve) hours.    Yes [provider]  pantoprazole (PROTONIX) 40 MG tablet Take 40 mg by mouth 2 (two) times daily.   Yes [provider]  pravastatin (PRAVACHOL) 20 MG tablet Take 20 mg by mouth daily. 05/01/15  Yes [provider]  PROAIR HFA 108 (90 Base) MCG/ACT inhaler Inhale 1-2 puffs into the lungs every 6 (six) hours as needed for wheezing or shortness of breath.    Yes [provider]  albuterol (PROVENTIL) (2.5 MG/3ML) 0.083% nebulizer solution Take 3 mLs (2.5 mg total) by nebulization every 6 (six) hours as needed for shortness of breath. 12/03/19   Alford Highland, MD  aspirin EC 81 MG tablet Take 81 mg by mouth daily.    [provider]  chlorpheniramine-HYDROcodone (TUSSIONEX) 10-8 MG/5ML SUER Take 5 mLs by mouth every 12 (twelve) hours as needed for cough. 12/03/19   Alford Highland, MD  docusate sodium (COLACE) 100 MG capsule Take 100 mg by mouth 2 (two) times daily.    [provider]  feeding supplement, ENSURE ENLIVE, (ENSURE ENLIVE) LIQD Take 237 mLs by mouth 2 (two) times daily between meals. 12/03/19   Alford Highland, MD  ferrous sulfate 325 (65 FE) MG tablet Take 325 mg by mouth 3 (three) times daily with meals.     [provider]  fluticasone-salmeterol (ADVAIR HFA) 115-21 MCG/ACT inhaler Inhale 2 puffs into the lungs 2 (two) times daily. 12/03/19   Alford Highland, MD  ketoconazole (NIZORAL) 2  % cream Apply 1 application topically as directed.    [provider]  Vitamin D, Ergocalciferol, (DRISDOL) 1.25 MG (50000 UNIT) CAPS capsule Take 50,000 Units by mouth once a week. 07/05/19   [provider]  zolpidem (AMBIEN) 5 MG tablet Take 5 mg by mouth at bedtime as needed for sleep.  11/08/19   [provider]    Allergies Patient has no known allergies.  Family History  Problem Relation Age of Onset  . Diabetes Mother   . Hypertension Mother   . Hypertension Sister   . Thyroid disease Sister   . Breast cancer Neg Hx     Social History Social History   Tobacco Use  . Smoking status: Former Smoker    Packs/day: 0.33    Years: 20.00    Pack years: 6.60    Types: Cigarettes  . Smokeless tobacco: Never Used  Substance Use Topics  . Alcohol use: No    Alcohol/week: 0.0 standard drinks  .  Drug use: No    Review of Systems  Review of Systems  Unable to perform ROS: Severe respiratory distress      ____________________________________________   PHYSICAL EXAM:  VITAL SIGNS: ED Triage Vitals  Enc Vitals Group     BP 02/12/20 0912 (!) 184/169     Pulse Rate 02/12/20 0912 97     Resp 02/12/20 0912 (!) 29     Temp 02/12/20 0912 97.9 F (36.6 C)     Temp Source 02/12/20 0912 Axillary     SpO2 02/12/20 0912 94 %     Weight 02/12/20 0907 276 lb 0.3 oz (125.2 kg)     Height 02/12/20 0907  (1.676 m)     Head Circumference --      Peak Flow --      Pain Score 02/12/20 0907 7     Pain Loc --      Pain Edu? --      Excl. in GC? --    Vitals:   02/12/20 1030 02/12/20 1045  BP:  (!) 179/114  Pulse: 84 84  Resp: 17 16  Temp: (!) 94.1 F (34.5 C)   SpO2: 92% 93%   Physical Exam Vitals and nursing note reviewed.  Constitutional:      General: She is in acute distress.     Appearance: She is well-developed. She is ill-appearing.  HENT:     Head: Normocephalic and atraumatic.     Right Ear: External ear normal.     Left Ear:  External ear normal.     Nose: Nose normal.     Mouth/Throat:     Mouth: Mucous membranes are moist.  Eyes:     Conjunctiva/sclera: Conjunctivae normal.  Cardiovascular:     Rate and Rhythm: Normal rate and regular rhythm.     Heart sounds: No murmur heard.   Pulmonary:     Effort: Pulmonary effort is normal. Tachypnea present. No respiratory distress.     Breath sounds: Examination of the right-upper field reveals wheezing. Examination of the left-upper field reveals wheezing. Examination of the right-middle field reveals wheezing. Examination of the left-middle field reveals wheezing. Examination of the right-lower field reveals wheezing. Examination of the left-lower field reveals wheezing. Wheezing present.  Abdominal:     Palpations: Abdomen is soft.     Tenderness: There is no abdominal tenderness.  Musculoskeletal:     Cervical back: Neck supple.     Right lower leg: No edema.     Left lower leg: No edema.  Skin:    General: Skin is warm and dry.  Neurological:     General: No focal deficit present.     Mental Status: She is lethargic and confused.     GCS: GCS eye subscore is 4. GCS verbal subscore is 4. GCS motor subscore is 4.     On arrival patient is moaning thrashing her arms side to side.  She is not able to follow any commands but does move all extremities.  PERRLA.  EOMI. ____________________________________________   LABS (all labs ordered are listed, but only abnormal results are displayed)  Labs Reviewed  GLUCOSE, CAPILLARY - Abnormal; Notable for the following components:      Result Value   Glucose-Capillary 243 (*)    All other components within normal limits  CBC WITH DIFFERENTIAL/PLATELET - Abnormal; Notable for the following components:   RDW 16.3 (*)    All other components within normal limits  COMPREHENSIVE METABOLIC PANEL -  Abnormal; Notable for the following components:   Glucose, Bld 275 (*)    Creatinine, Ser 1.38 (*)    Calcium 8.8 (*)     GFR, Estimated 41 (*)    All other components within normal limits  BRAIN NATRIURETIC PEPTIDE - Abnormal; Notable for the following components:   B Natriuretic Peptide 320.8 (*)    All other components within normal limits  BLOOD GAS, VENOUS - Abnormal; Notable for the following components:   pH, Ven 7.22 (*)    pCO2, Ven 72 (*)    pO2, Ven 68.0 (*)    Bicarbonate 29.5 (*)    All other components within normal limits  LACTIC ACID, PLASMA - Abnormal; Notable for the following components:   Lactic Acid, Venous 4.6 (*)    All other components within normal limits  URINALYSIS, COMPLETE (UACMP) WITH MICROSCOPIC - Abnormal; Notable for the following components:   Color, Urine YELLOW (*)    APPearance HAZY (*)    Glucose, UA 150 (*)    Protein, ur 100 (*)    All other components within normal limits  RESPIRATORY PANEL BY RT PCR (FLU A&B, COVID)  CULTURE, BLOOD (ROUTINE X 2)  CULTURE, BLOOD (ROUTINE X 2)  CULTURE, RESPIRATORY  MAGNESIUM  PROCALCITONIN  LACTIC ACID, PLASMA  HEMOGLOBIN A1C  CBC  CREATININE, SERUM  TROPONIN I (HIGH SENSITIVITY)  TROPONIN I (HIGH SENSITIVITY)   ____________________________________________  EKG  Sinus rhythm with a ventricular rate of 83, normal axis, slightly prolonged QTc interval at 489 and no clear evidence of acute ischemia or other significant underlying arrhythmia. ____________________________________________  RADIOLOGY  ED MD interpretation: No PE.  Emphysema and some mild pulmonary edema with small bilateral pleural effusions.  Official radiology report(s): DG Abdomen 1 View  Result Date: 02/12/2020 CLINICAL DATA:  Intubation and orogastric tube placement EXAM: ABDOMEN - 1 VIEW COMPARISON:  09/05/2014 FINDINGS: The enteric tube tip overlaps the stomach. Calcified uterine fibroid and atherosclerotic plaque. Bowel gas pattern is normal IMPRESSION: The enteric tube tip and side-port are at the stomach. Electronically Signed   By: Marnee Spring M.D.   On: 02/12/2020 09:43   CT Angio Chest PE W and/or Wo Contrast  Result Date: 02/12/2020 CLINICAL DATA:  Patient found unresponsive today with agonal breathing. EXAM: CT ANGIOGRAPHY CHEST WITH CONTRAST TECHNIQUE: Multidetector CT imaging of the chest was performed using the standard protocol during bolus administration of intravenous contrast. Multiplanar CT image reconstructions and MIPs were obtained to evaluate the vascular anatomy. CONTRAST:  75 mL OMNIPAQUE IOHEXOL 350 MG/ML SOLN COMPARISON:  CT chest 12/01/2019. Single-view of the chest 12/01/2019 and 02/12/2020. FINDINGS: Cardiovascular: The exam is somewhat limited by patient motion. No pulmonary embolus is identified. Mild cardiomegaly. No pericardial effusion. Calcific aortic and coronary atherosclerosis. Mediastinum/Nodes: No enlarged mediastinal, hilar, or axillary lymph nodes. Thyroid gland, trachea, and esophagus demonstrate no significant findings. Endotracheal tube noted. Lungs/Pleura: Small bilateral pleural effusions. The lungs are emphysematous. Hazy bilateral airspace disease is seen and there is interlobular septal thickening. Changes are diffuse. Upper Abdomen: NG tube is seen in the stomach. Musculoskeletal: No acute or focal abnormality. Review of the MIP images confirms the above findings. IMPRESSION: No pulmonary embolus is identified although the exam is somewhat degraded by patient motion. Interlobular septal thickening and diffuse hazy bilateral airspace disease most consistent with pulmonary edema in this patient with cardiomegaly and small pleural effusions. Aortic Atherosclerosis (ICD10-I70.0) and Emphysema (ICD10-J43.9). Electronically Signed   By: Drusilla Kanner M.D.  On: 02/12/2020 10:17   DG Chest Portable 1 View  Result Date: 02/12/2020 CLINICAL DATA:  Post intubation and OG placement. Unresponsive upon arrival. EXAM: PORTABLE CHEST 1 VIEW COMPARISON:  12/01/2019 FINDINGS: Endotracheal tube tip is  approximately 4 cm above the carina. Orogastric tube courses below the diaphragm outside the field of view. Mild diffuse interstitial prominence. Ill-defined right perihilar opacities. Cardiomediastinal silhouette is within normal limits. No visible pneumothorax or pleural effusions on this limited supine portable radiograph. No evidence of acute osseous abnormality. IMPRESSION: 1. Endotracheal tube tip is approximately 4 cm above carina. 2. Orogastric tube courses below the diaphragm outside the field of view. 3. Mild diffuse interstitial prominence and ill-defined right perihilar opacities, which may represent mild pulmonary edema (favored), aspiration or pneumonia. Electronically Signed   By: Feliberto HartsFrederick S Jones MD   On: 02/12/2020 09:43    ____________________________________________   PROCEDURES  Procedure(s) performed (including Critical Care):  Date/Time: 02/12/2020 9:27 AM Performed by: Gilles ChiquitoSmith, Graeden Bitner P, MD Pre-anesthesia Checklist: Patient identified, Emergency Drugs available, Suction available and Patient being monitored Oxygen Delivery Method: Non-rebreather mask Preoxygenation: Pre-oxygenation with 100% oxygen Induction Type: Rapid sequence Laryngoscope Size: Glidescope Grade View: Grade IV Tube size: 7.0 mm Number of attempts: 1 Airway Equipment and Method: Rigid stylet and Video-laryngoscopy Placement Confirmation: ETT inserted through vocal cords under direct vision Tube secured with: ETT holder    .Critical Care Performed by: Gilles ChiquitoSmith, Elycia Woodside P, MD Authorized by: Gilles ChiquitoSmith, Kalya Troeger P, MD   Critical care provider statement:    Critical care time (minutes):  45   Critical care time was exclusive of:  Separately billable procedures and treating other patients   Critical care was necessary to treat or prevent imminent or life-threatening deterioration of the following conditions:  Respiratory failure   Critical care was time spent personally by me on the following activities:   Discussions with consultants, evaluation of patient's response to treatment, examination of patient, ordering and performing treatments and interventions, ordering and review of laboratory studies, ordering and review of radiographic studies, pulse oximetry, re-evaluation of patient's condition, obtaining history from patient or surrogate and review of old charts .1-3 Lead EKG Interpretation Performed by: Gilles ChiquitoSmith, Daylen Lipsky P, MD Authorized by: Gilles ChiquitoSmith, Manila Rommel P, MD     Interpretation: normal     ECG rate assessment: normal     Rhythm: sinus rhythm     Ectopy: none     Conduction: normal       ____________________________________________   INITIAL IMPRESSION / ASSESSMENT AND PLAN / ED COURSE        Patient presents with above to history exam for assessment of respiratory distress.  On arrival patient is tachypneic and altered with an SPO2 saturation of 90% on nonrebreather.  Slightly hypertensive with a BP of 184/169 with otherwise stable vital signs.  Shortly after arrival and verification of glucose patient was intubated for severe respiratory distress and hypoxic respiratory failure.  Differential includes but is not limited to ACS, PE, COPD exacerbation, CHF, pneumonia, and Covid.  CTA chest with no evidence of PE but does show some edema.  Given patient's elevated BNP is possible she is having heart failure exacerbation contributing to her hypoxic respiratory failure.  However given patient has a history of severe COPD has significant wheezing on exam I believe she is likely also experiencing severe COPD exacerbation.  VBG does show a component of acute on chronic hypercarbic respiratory failure.  Patient was treated with Solu-Medrol, duo nebs, and antibiotics.  Patient's lactic acid  is significantly elevated at 4.6 she has no fever or elevation of white blood cell count or clear foci of infection on exam or chest imaging.  In addition her UA which is 750 close of 100 protein does not  show evidence of infection.  Low suspicion for sepsis at this time although blood cultures were obtained and patient was given antibiotics as noted below.  While patient ECG does have some nonspecific findings initial troponin is 13 and have a low suspicion for ACS at this time.  However I will plan to obtain a second 2-hour troponin.  I will plan to admit the patient to medical ICU for further evaluation and management. ____________________________________________   FINAL CLINICAL IMPRESSION(S) / ED DIAGNOSES  Final diagnoses:  Acute on chronic respiratory failure, unspecified whether with hypoxia or hypercapnia (HCC)  COPD exacerbation (HCC)  Lactic acid acidosis  Elevated brain natriuretic peptide (BNP) level  Atherosclerosis    Medications  fentaNYL (SUBLIMAZE) injection 100 mcg (100 mcg Intravenous Given 02/12/20 0949)  fentaNYL (SUBLIMAZE) injection 100 mcg (has no administration in time range)  insulin aspart (novoLOG) injection 0-15 Units (has no administration in time range)  azithromycin (ZITHROMAX) 500 mg in sodium chloride 0.9 % 250 mL IVPB (500 mg Intravenous New Bag/Given 02/12/20 1037)  sodium chloride flush (NS) 0.9 % injection 3 mL (has no administration in time range)  sodium chloride flush (NS) 0.9 % injection 3 mL (has no administration in time range)  0.9 %  sodium chloride infusion (has no administration in time range)  acetaminophen (TYLENOL) tablet 650 mg (has no administration in time range)  docusate sodium (COLACE) capsule 100 mg (has no administration in time range)  polyethylene glycol (MIRALAX / GLYCOLAX) packet 17 g (has no administration in time range)  ondansetron (ZOFRAN) injection 4 mg (has no administration in time range)  famotidine (PEPCID) IVPB 20 mg premix (has no administration in time range)  docusate (COLACE) 50 MG/5ML liquid 100 mg (has no administration in time range)  polyethylene glycol (MIRALAX / GLYCOLAX) packet 17 g (has no  administration in time range)  enoxaparin (LOVENOX) injection 40 mg (has no administration in time range)  propofol (DIPRIVAN) 1000 MG/100ML infusion (has no administration in time range)  fentaNYL (SUBLIMAZE) injection 50 mcg (has no administration in time range)  fentaNYL (SUBLIMAZE) injection 50-200 mcg (has no administration in time range)  famotidine (PEPCID) IVPB 20 mg premix (has no administration in time range)  methylPREDNISolone sodium succinate (SOLU-MEDROL) 40 mg/mL injection 40 mg (has no administration in time range)  lactated ringers bolus 500 mL (0 mLs Intravenous Stopped 02/12/20 1052)  methylPREDNISolone sodium succinate (SOLU-MEDROL) 125 mg/2 mL injection 125 mg (125 mg Intravenous Given 02/12/20 1029)  ipratropium-albuterol (DUONEB) 0.5-2.5 (3) MG/3ML nebulizer solution 9 mL (9 mLs Nebulization Given 02/12/20 1038)  cefTRIAXone (ROCEPHIN) 1 g in sodium chloride 0.9 % 100 mL IVPB (0 g Intravenous Stopped 02/12/20 1053)  iohexol (OMNIPAQUE) 350 MG/ML injection 75 mL (75 mLs Intravenous Contrast Given 02/12/20 0956)  midazolam (VERSED) injection 4 mg (4 mg Intravenous Given 02/12/20 1012)     ED Discharge Orders    None       Note:  This document was prepared using Dragon voice recognition software and may include unintentional dictation errors.   Gilles Chiquito, MD 02/12/20 [phone]

## 2020-02-12 NOTE — ED Notes (Signed)
Pt moving lower extremities and appears to be trying to bite at tube. Pt given PRN sedation med at this time

## 2020-02-12 NOTE — ED Notes (Signed)
RT at bedside. Pt transported to CT at this time.

## 2020-02-12 NOTE — ED Notes (Signed)
Pt transported to CT at this time after given verbal order for sedation of fentanyl IVP per EDP Smith.

## 2020-02-12 NOTE — ED Notes (Signed)
Pt continues to raise head. Pt given another 20 bolus at this time

## 2020-02-12 NOTE — ED Notes (Signed)
PT visualized to have scar from L inner thigh to past the L knee

## 2020-02-12 NOTE — ED Notes (Signed)
Pt began waking up and given another dose of 100 mcg of fent. Pt ransported to ICU and ICU called and informed of pt sedation status.

## 2020-02-12 NOTE — ED Notes (Signed)
Resent lavender tube to lab per request

## 2020-02-13 ENCOUNTER — Inpatient Hospital Stay: Payer: Medicare Other

## 2020-02-13 DIAGNOSIS — J441 Chronic obstructive pulmonary disease with (acute) exacerbation: Secondary | ICD-10-CM | POA: Diagnosis not present

## 2020-02-13 DIAGNOSIS — J9601 Acute respiratory failure with hypoxia: Secondary | ICD-10-CM | POA: Diagnosis not present

## 2020-02-13 LAB — BASIC METABOLIC PANEL
Anion gap: 13 (ref 5–15)
BUN: 17 mg/dL (ref 8–23)
CO2: 25 mmol/L (ref 22–32)
Calcium: 9.2 mg/dL (ref 8.9–10.3)
Chloride: 106 mmol/L (ref 98–111)
Creatinine, Ser: 1.13 mg/dL — ABNORMAL HIGH (ref 0.44–1.00)
GFR, Estimated: 52 mL/min — ABNORMAL LOW (ref >60)
Glucose, Bld: 124 mg/dL — ABNORMAL HIGH (ref 70–99)
Potassium: 3.9 mmol/L (ref 3.5–5.1)
Sodium: 144 mmol/L (ref 135–145)

## 2020-02-13 LAB — BLOOD GAS, ARTERIAL
Acid-Base Excess: 2.6 mmol/L — ABNORMAL HIGH (ref 0.0–2.0)
Bicarbonate: 27.2 mmol/L (ref 20.0–28.0)
FIO2: 0.6
MECHVT: 500 mL
O2 Saturation: 94.5 %
PEEP: 5 cmH2O
Patient temperature: 37
RATE: 20 resp/min
pCO2 arterial: 41 mmHg (ref 32.0–48.0)
pH, Arterial: 7.43 (ref 7.350–7.450)
pO2, Arterial: 71 mmHg — ABNORMAL LOW (ref 83.0–108.0)

## 2020-02-13 LAB — CBC
HCT: 41.6 % (ref 36.0–46.0)
Hemoglobin: 13.1 g/dL (ref 12.0–15.0)
MCH: 28.3 pg (ref 26.0–34.0)
MCHC: 31.5 g/dL (ref 30.0–36.0)
MCV: 89.8 fL (ref 80.0–100.0)
Platelets: 308 10*3/uL (ref 150–400)
RBC: 4.63 MIL/uL (ref 3.87–5.11)
RDW: 16 % — ABNORMAL HIGH (ref 11.5–15.5)
WBC: 8.9 10*3/uL (ref 4.0–10.5)
nRBC: 0 % (ref 0.0–0.2)

## 2020-02-13 LAB — GLUCOSE, CAPILLARY
Glucose-Capillary: 120 mg/dL — ABNORMAL HIGH (ref 70–99)
Glucose-Capillary: 134 mg/dL — ABNORMAL HIGH (ref 70–99)
Glucose-Capillary: 115 mg/dL — ABNORMAL HIGH (ref 70–99)
Glucose-Capillary: 125 mg/dL — ABNORMAL HIGH (ref 70–99)
Glucose-Capillary: 140 mg/dL — ABNORMAL HIGH (ref 70–99)

## 2020-02-13 LAB — TRIGLYCERIDES: Triglycerides: 195 mg/dL — ABNORMAL HIGH (ref <150)

## 2020-02-13 MED ORDER — FUROSEMIDE 10 MG/ML IJ SOLN
60.0000 mg | Freq: Once | INTRAMUSCULAR | Status: AC
  Administered 2020-02-13: 60 mg via INTRAVENOUS
  Filled 2020-02-13: qty 6

## 2020-02-13 MED ORDER — MIDAZOLAM HCL 2 MG/2ML IJ SOLN: 2.0000 mg | INTRAMUSCULAR | Status: DC | PRN

## 2020-02-13 MED ORDER — CHLORHEXIDINE GLUCONATE CLOTH 2 % EX PADS
6.0000 | MEDICATED_PAD | Freq: Every day | CUTANEOUS | Status: DC
  Administered 2020-02-14 – 2020-02-15 (×2): 6 via TOPICAL

## 2020-02-13 MED ORDER — FENTANYL CITRATE (PF) 100 MCG/2ML IJ SOLN: 50.0000 ug | Freq: Once | INTRAMUSCULAR | Status: DC

## 2020-02-13 MED ORDER — CLONIDINE HCL 0.3 MG/24HR TD PTWK
0.3000 mg | MEDICATED_PATCH | TRANSDERMAL | Status: DC
  Administered 2020-02-14: 0.3 mg via TRANSDERMAL
  Filled 2020-02-13: qty 1

## 2020-02-13 MED ORDER — SIMETHICONE 80 MG PO CHEW
80.0000 mg | CHEWABLE_TABLET | Freq: Four times a day (QID) | ORAL | Status: DC | PRN
  Administered 2020-02-14: 80 mg via ORAL
  Filled 2020-02-13 (×3): qty 1

## 2020-02-13 MED ORDER — FENTANYL BOLUS VIA INFUSION
50.0000 ug | INTRAVENOUS | Status: DC | PRN
  Filled 2020-02-13: qty 50

## 2020-02-13 MED ORDER — PANTOPRAZOLE SODIUM 40 MG IV SOLR
40.0000 mg | Freq: Once | INTRAVENOUS | Status: AC
  Administered 2020-02-13: 40 mg via INTRAVENOUS
  Filled 2020-02-13: qty 40

## 2020-02-13 MED ORDER — FENTANYL 2500MCG IN NS 250ML (10MCG/ML) PREMIX INFUSION
50.0000 ug/h | INTRAVENOUS | Status: DC
  Administered 2020-02-13: 50 ug/h via INTRAVENOUS
  Filled 2020-02-13: qty 250

## 2020-02-13 MED ORDER — HYDRALAZINE HCL 20 MG/ML IJ SOLN
10.0000 mg | Freq: Four times a day (QID) | INTRAMUSCULAR | Status: DC | PRN
  Administered 2020-02-15 – 2020-02-16 (×2): 10 mg via INTRAVENOUS
  Filled 2020-02-13 (×3): qty 1

## 2020-02-13 NOTE — Progress Notes (Signed)
CRITICAL CARE NOTE  SYNOPSIS 64 y.o.femalewith a past medical history of HTN, HPL, chronic pain on oxycodone, hypothyroidism, CKD,PVD,CVA,  +chronic hypoxic respiratory failure2/COPD on BiPAP at night at home who presents via EMS after they were called out for respiratory distress.   Per EMS patient had agonal respirations on arrival and was unable provide any history.  They provided bag-valve-mask ventilation in route.    Shortly after patient arrival sister at bedside who was able to state that the patient does not wear oxygen normally during the day only BiPAP at night and that this morning patient was complaining of severe shortness of breath and seem to be getting more more short of breath until EMS was called. Patient is otherwise not been complaining of any other acute sick symptoms per sister.  Patient with progressive resp failure and distress Patient was intubated emergently  CTA did NOT reveal PE but shows interstitial edema, and what seem to be loculated effusions on RT side  10/18 severe COPD, severe resp failure, Severe CHF 10/19 severe illness  CC  follow up respiratory failure  SUBJECTIVE Patient remains critically ill Prognosis is guarded   BP 139/60   Pulse 65   Temp 98.1 F (36.7 C)   Resp 20   Ht '5\' 6"'  (1.676 m)   Wt 125.2 kg   SpO2 96%   BMI 44.55 kg/m    I/O last 3 completed shifts: In: 1029.4 [I.V.:679.4; IV Piggyback:350] Out: 2495 [Urine:2345; Emesis/NG output:150] Total I/O In: 163.8 [I.V.:113.8; IV Piggyback:50] Out: 150 [Urine:150]  SpO2: 96 % FiO2 (%): 60 %  Estimated body mass index is 44.55 kg/m as calculated from the following:   Height as of this encounter: '5\' 6"'  (1.676 m).   Weight as of this encounter: 125.2 kg.  SIGNIFICANT EVENTS   REVIEW OF SYSTEMS  PATIENT IS UNABLE TO PROVIDE COMPLETE REVIEW OF SYSTEMS DUE TO SEVERE CRITICAL ILLNESS        PHYSICAL EXAMINATION:  GENERAL:critically ill  appearing, +resp distress NECK: Supple.  PULMONARY: +rhonchi, +wheezing CARDIOVASCULAR: S1 and S2. Regular rate and rhythm. No murmurs, rubs, or gallops.  GASTROINTESTINAL: Soft, nontender, -distended.  Positive bowel sounds.   MUSCULOSKELETAL: No swelling, clubbing, or edema.  NEUROLOGIC: obtunded, GCS<8 SKIN:intact,warm,dry  MEDICATIONS: I have reviewed all medications and confirmed regimen as documented   CULTURE RESULTS   Recent Results (from the past 240 hour(s))  Respiratory Panel by RT PCR (Flu A&B, Covid) - Nasopharyngeal Swab     Status: None   Collection Time: 02/12/20  9:16 AM   Specimen: Nasopharyngeal Swab  Result Value Ref Range Status   SARS Coronavirus 2 by RT PCR NEGATIVE NEGATIVE Final    Comment: (NOTE) SARS-CoV-2 target nucleic acids are NOT DETECTED.  The SARS-CoV-2 RNA is generally detectable in upper respiratoy specimens during the acute phase of infection. The lowest concentration of SARS-CoV-2 viral copies this assay can detect is 131 copies/mL. A negative result does not preclude SARS-Cov-2 infection and should not be used as the sole basis for treatment or other patient management decisions. A negative result may occur with  improper specimen collection/handling, submission of specimen other than nasopharyngeal swab, presence of viral mutation(s) within the areas targeted by this assay, and inadequate number of viral copies (<131 copies/mL). A negative result must be combined with clinical observations, patient history, and epidemiological information. The expected result is Negative.  Fact Sheet for Patients:  PinkCheek.be  Fact Sheet for Healthcare Providers:  GravelBags.it  This test is no  t yet approved or cleared by the [place] and  has been authorized for detection and/or diagnosis of SARS-CoV-2 by FDA under an Emergency Use Authorization (EUA). This EUA will remain  in  effect (meaning this test can be used) for the duration of the COVID-19 declaration under Section 564(b)(1) of the Act, 21 U.S.C. section 360bbb-3(b)(1), unless the authorization is terminated or revoked sooner.     Influenza A by PCR NEGATIVE NEGATIVE Final   Influenza B by PCR NEGATIVE NEGATIVE Final    Comment: (NOTE) The Xpert Xpress SARS-CoV-2/FLU/RSV assay is intended as an aid in  the diagnosis of influenza from Nasopharyngeal swab specimens and  should not be used as a sole basis for treatment. Nasal washings and  aspirates are unacceptable for Xpert Xpress SARS-CoV-2/FLU/RSV  testing.  Fact Sheet for Patients: PinkCheek.be  Fact Sheet for Healthcare Providers: GravelBags.it  This test is not yet approved or cleared by the [place] FDA and  has been authorized for detection and/or diagnosis of SARS-CoV-2 by  FDA under an Emergency Use Authorization (EUA). This EUA will remain  in effect (meaning this test can be used) for the duration of the  Covid-19 declaration under Section 564(b)(1) of the Act, 21  U.S.C. section 360bbb-3(b)(1), unless the authorization is  terminated or revoked. Performed at [facility], [place], [place] [address]           IMAGING    DG Abdomen 1 View  Result Date: 02/12/2020 CLINICAL DATA:  Intubation and orogastric tube placement EXAM: ABDOMEN - 1 VIEW COMPARISON:  09/05/2014 FINDINGS: The enteric tube tip overlaps the stomach. Calcified uterine fibroid and atherosclerotic plaque. Bowel gas pattern is normal IMPRESSION: The enteric tube tip and side-port are at the stomach. Electronically Signed   By: Monte Fantasia M.D.   On: 02/12/2020 09:43   CT HEAD WO CONTRAST  Result Date: 02/12/2020 CLINICAL DATA:  Cerebral hemorrhage suspected. EXAM: CT HEAD WITHOUT CONTRAST TECHNIQUE: Contiguous axial images were obtained from the base of the skull through  the vertex without intravenous contrast. COMPARISON:  None. FINDINGS: Brain: No evidence of acute large vascular territory infarction, hemorrhage, hydrocephalus, extra-axial collection or mass lesion/mass effect. Calcification along the falx. Mild diffuse cerebral atrophy. Vascular: Calcific atherosclerosis. Skull: No evidence of acute fracture. Sinuses/Orbits: Scattered ethmoid air cell fluid and mucosal thickening. No evidence of acute orbital abnormality. Other: Fluid in the incompletely imaged pharynx. IMPRESSION: No evidence of acute intracranial abnormality. Electronically Signed   By: Margaretha Sheffield MD   On: 02/12/2020 12:36   CT Angio Chest PE W and/or Wo Contrast  Result Date: 02/12/2020 CLINICAL DATA:  Patient found unresponsive today with agonal breathing. EXAM: CT ANGIOGRAPHY CHEST WITH CONTRAST TECHNIQUE: Multidetector CT imaging of the chest was performed using the standard protocol during bolus administration of intravenous contrast. Multiplanar CT image reconstructions and MIPs were obtained to evaluate the vascular anatomy. CONTRAST:  75 mL OMNIPAQUE IOHEXOL 350 MG/ML SOLN COMPARISON:  CT chest 12/01/2019. Single-view of the chest 12/01/2019 and 02/12/2020. FINDINGS: Cardiovascular: The exam is somewhat limited by patient motion. No pulmonary embolus is identified. Mild cardiomegaly. No pericardial effusion. Calcific aortic and coronary atherosclerosis. Mediastinum/Nodes: No enlarged mediastinal, hilar, or axillary lymph nodes. Thyroid gland, trachea, and esophagus demonstrate no significant findings. Endotracheal tube noted. Lungs/Pleura: Small bilateral pleural effusions. The lungs are emphysematous. Hazy bilateral airspace disease is seen and there is interlobular septal thickening. Changes are diffuse. Upper Abdomen: NG tube is seen in  the stomach. Musculoskeletal: No acute or focal abnormality. Review of the MIP images confirms the above findings. IMPRESSION: No pulmonary embolus is  identified although the exam is somewhat degraded by patient motion. Interlobular septal thickening and diffuse hazy bilateral airspace disease most consistent with pulmonary edema in this patient with cardiomegaly and small pleural effusions. Aortic Atherosclerosis (ICD10-I70.0) and Emphysema (ICD10-J43.9). Electronically Signed   By: Inge Rise M.D.   On: 02/12/2020 10:17   DG Chest Port 1 View  Result Date: 02/13/2020 CLINICAL DATA:  Patient was found unresponsive. Decreased oxygenation on room air. EXAM: PORTABLE CHEST 1 VIEW COMPARISON:  02/12/2020 FINDINGS: Endotracheal and enteric tubes are unchanged in position. Increasing bilateral perihilar and basilar infiltration may indicate edema, aspiration, or pneumonia. Small left pleural effusion is suggested. Cardiac enlargement. Mediastinal contours appear intact. IMPRESSION: Increasing bilateral perihilar and basilar infiltration and small left pleural effusion. Electronically Signed   By: Lucienne Capers M.D.   On: 02/13/2020 05:06   DG Chest Portable 1 View  Result Date: 02/12/2020 CLINICAL DATA:  Post intubation and OG placement. Unresponsive upon arrival. EXAM: PORTABLE CHEST 1 VIEW COMPARISON:  12/01/2019 FINDINGS: Endotracheal tube tip is approximately 4 cm above the carina. Orogastric tube courses below the diaphragm outside the field of view. Mild diffuse interstitial prominence. Ill-defined right perihilar opacities. Cardiomediastinal silhouette is within normal limits. No visible pneumothorax or pleural effusions on this limited supine portable radiograph. No evidence of acute osseous abnormality. IMPRESSION: 1. Endotracheal tube tip is approximately 4 cm above carina. 2. Orogastric tube courses below the diaphragm outside the field of view. 3. Mild diffuse interstitial prominence and ill-defined right perihilar opacities, which may represent mild pulmonary edema (favored), aspiration or pneumonia. Electronically Signed   By: Margaretha Sheffield MD   On: 02/12/2020 09:43   ECHOCARDIOGRAM COMPLETE  Result Date: 02/12/2020    ECHOCARDIOGRAM REPORT   Patient Name:   Kimberly Mccarthy Date of Exam: 02/12/2020 Medical Rec #:  [id number]        Height:       66.0 in Accession #:    [id number]       Weight:       276.0 lb Date of Birth:  December 30, 1955       BSA:          2.293 m Patient Age:    25 years         BP:           105/48 mmHg Patient Gender: F                HR:           65 bpm. Exam Location:  [facility] Procedure: 2D Echo, Color Doppler and Cardiac Doppler STAT ECHO Indications:     I51.7 Cardiomegaly  History:         Patient has prior history of Echocardiogram examinations. CAD,                  Stroke and PVD; Risk Factors:Hypertension.  Sonographer:     Charmayne Sheer RDCS (AE) Referring Phys:  [id number] Ida Rogue SMITH Diagnosing Phys: Kathlyn Sacramento MD  Sonographer Comments: Suboptimal parasternal window and echo performed with patient supine and on artificial respirator. Image acquisition challenging due to patient body habitus. IMPRESSIONS  1. Left ventricular ejection fraction, by estimation, is 50 to 55%. The left ventricle has low normal function. Left ventricular endocardial border not optimally defined to evaluate regional wall motion. Left ventricular diastolic  parameters are indeterminate.  2. Right ventricular systolic function is normal. The right ventricular size is normal. Tricuspid regurgitation signal is inadequate for assessing PA pressure.  3. The pericardial effusion is anterior to the right ventricle.  4. The mitral valve is normal in structure. No evidence of mitral valve regurgitation. No evidence of mitral stenosis.  5. The aortic valve is normal in structure. Aortic valve regurgitation is not visualized. Mild to moderate aortic valve sclerosis/calcification is present, without any evidence of aortic stenosis.  6. The inferior vena cava is normal in size with <50% respiratory variability, suggesting right atrial pressure of 8  mmHg. FINDINGS  Left Ventricle: Left ventricular ejection fraction, by estimation, is 50 to 55%. The left ventricle has low normal function. Left ventricular endocardial border not optimally defined to evaluate regional wall motion. The left ventricular internal cavity  size was normal in size. There is no left ventricular hypertrophy. Left ventricular diastolic parameters are indeterminate. Right Ventricle: The right ventricular size is normal. No increase in right ventricular wall thickness. Right ventricular systolic function is normal. Tricuspid regurgitation signal is inadequate for assessing PA pressure. Left Atrium: Left atrial size was normal in size. Right Atrium: Right atrial size was normal in size. Pericardium: Trivial pericardial effusion is present. The pericardial effusion is anterior to the right ventricle. Mitral Valve: The mitral valve is normal in structure. No evidence of mitral valve regurgitation. No evidence of mitral valve stenosis. MV peak gradient, 6.2 mmHg. The mean mitral valve gradient is 3.0 mmHg. Tricuspid Valve: The tricuspid valve is normal in structure. Tricuspid valve regurgitation is not demonstrated. No evidence of tricuspid stenosis. Aortic Valve: The aortic valve is normal in structure. Aortic valve regurgitation is not visualized. Mild to moderate aortic valve sclerosis/calcification is present, without any evidence of aortic stenosis. Aortic valve mean gradient measures 3.0 mmHg. Aortic valve peak gradient measures 6.7 mmHg. Aortic valve area, by VTI measures 2.18 cm. Pulmonic Valve: The pulmonic valve was normal in structure. Pulmonic valve regurgitation is not visualized. No evidence of pulmonic stenosis. Aorta: The aortic root is normal in size and structure. Venous: The inferior vena cava is normal in size with less than 50% respiratory variability, suggesting right atrial pressure of 8 mmHg. IAS/Shunts: No atrial level shunt detected by color flow Doppler.  LEFT  VENTRICLE PLAX 2D LVIDd:         3.98 cm  Diastology LVIDs:         3.15 cm  LV e' medial:    5.98 cm/s LV PW:         1.08 cm  LV E/e' medial:  16.9 LV IVS:        0.99 cm  LV e' lateral:   7.07 cm/s LVOT diam:     2.10 cm  LV E/e' lateral: 14.3 LV SV:         63 LV SV Index:   27 LVOT Area:     3.46 cm  RIGHT VENTRICLE RV Basal diam:  3.89 cm LEFT ATRIUM             Index       RIGHT ATRIUM           Index LA Vol (A2C):   53.1 ml 23.16 ml/m RA Area:     12.20 cm LA Vol (A4C):   32.1 ml 14.00 ml/m RA Volume:   27.20 ml  11.86 ml/m LA Biplane Vol: 42.8 ml 18.66 ml/m  AORTIC VALVE AV Area (Vmax):  2.26 cm AV Area (Vmean):   2.08 cm AV Area (VTI):     2.18 cm AV Vmax:           129.00 cm/s AV Vmean:          82.700 cm/s AV VTI:            0.288 m AV Peak Grad:      6.7 mmHg AV Mean Grad:      3.0 mmHg LVOT Vmax:         84.20 cm/s LVOT Vmean:        49.700 cm/s LVOT VTI:          0.181 m LVOT/AV VTI ratio: 0.63  AORTA Ao Root diam: 2.50 cm MITRAL VALVE                TRICUSPID VALVE MV Area (PHT): 4.52 cm     TR Peak grad:   29.2 mmHg MV Peak grad:  6.2 mmHg     TR Vmax:        270.00 cm/s MV Mean grad:  3.0 mmHg MV Vmax:       1.25 m/s     SHUNTS MV Vmean:      74.4 cm/s    Systemic VTI:  0.18 m MV Decel Time: 168 msec     Systemic Diam: 2.10 cm MV E velocity: 101.05 cm/s MV A velocity: 82.30 cm/s MV E/A ratio:  1.23 Kathlyn Sacramento MD Electronically signed by Kathlyn Sacramento MD Signature Date/Time: 02/12/2020/1:36:34 PM    Final    CMP Latest Ref Rng & Units 02/13/2020 02/12/2020 12/02/2019  Glucose 70 - 99 mg/dL 124(H) 275(H) 164(H)  BUN 8 - 23 mg/dL 17 20 24(H)  Creatinine 0.44 - 1.00 mg/dL 1.13(H) 1.38(H) 1.41(H)  Sodium 135 - 145 mmol/L 144 139 140  Potassium 3.5 - 5.1 mmol/L 3.9 4.0 4.5  Chloride 98 - 111 mmol/L 106 102 101  CO2 22 - 32 mmol/L '25 24 27  ' Calcium 8.9 - 10.3 mg/dL 9.2 8.8(L) 8.9  Total Protein 6.5 - 8.1 g/dL - 8.0 -  Total Bilirubin 0.3 - 1.2 mg/dL - 0.8 -  Alkaline Phos 38 -  126 U/L - 95 -  AST 15 - 41 U/L - 22 -  ALT 0 - 44 U/L - 17 -      Indwelling Urinary Catheter continued, requirement due to   Reason to continue Indwelling Urinary Catheter strict Intake/Output monitoring for hemodynamic instability         Ventilator continued, requirement due to severe respiratory failure   Ventilator Sedation RASS 0 to -2      ASSESSMENT AND PLAN SYNOPSIS 64 yo morbidly obese AAF with h/o COPD and OSA with acute progressive hypoxic resp failure c/w acute systolic heart failure    Severe ACUTE Hypoxic and Hypercapnic Respiratory Failure -continue Full MV support -continue Bronchodilator Therapy -Wean Fio2 and PEEP as tolerated -will perform SAT/SBT when respiratory parameters are met -VAP/VENT bundle implementation  ACUTE SYSTOLIC CARDIAC FAILURE- HFpEF -oxygen as needed -Lasix as tolerated  Morbid obesity, possible OSA.   Will certainly impact respiratory mechanics, ventilator weaning Suspect will need to consider additional PEEP   ACUTE KIDNEY INJURY/Renal Failure -continue Foley Catheter-assess need -Avoid nephrotoxic agents -Follow urine output, BMP -Ensure adequate renal perfusion, optimize oxygenation -Renal dose medications     NEUROLOGY - intubated and sedated - minimal sedation to achieve a RASS goal: -1 Wake up assessment pending  CARDIAC ICU monitoring  GI  GI PROPHYLAXIS as indicated  DIET-->TF's as tolerated Constipation protocol as indicated  ENDO - will use ICU hypoglycemic\Hyperglycemia protocol if indicated     ELECTROLYTES -follow labs as needed -replace as needed -pharmacy consultation and following   DVT/GI PRX ordered and assessed TRANSFUSIONS AS NEEDED MONITOR FSBS I Assessed the need for Labs I Assessed the need for Foley I Assessed the need for Central Venous Line Family Discussion when available I Assessed the need for Mobilization I made an Assessment of medications to be adjusted  accordingly Safety Risk assessment completed   CASE DISCUSSED IN MULTIDISCIPLINARY ROUNDS WITH ICU TEAM  Critical Care Time devoted to patient care services described in this note is 35 minutes.   Overall, patient is critically ill, prognosis is guarded.  Patient with Multiorgan failure and at high risk for cardiac arrest and death.    Corrin Parker, M.D.  [facility]  Medical Director [place] Director [facility] Cardio-Pulmonary Department

## 2020-02-13 NOTE — ED Notes (Addendum)
MD Katrinka Blazing informed of pt's critical pco2 result.

## 2020-02-13 NOTE — Progress Notes (Signed)
Bipap resumed per patient request. Patient visible more SOB.

## 2020-02-13 NOTE — Progress Notes (Signed)
Pt extubated to bipap at this time. Pt is alert and following commands. VSS at this time.

## 2020-02-13 NOTE — Progress Notes (Signed)
Extubated to bipap per MD, tolerating well at this time. No stridor noted post extubation.

## 2020-02-13 NOTE — ED Notes (Signed)
MD Katrinka Blazing informed of pt's critical lactic result of 4.6

## 2020-02-14 ENCOUNTER — Inpatient Hospital Stay: Payer: Medicare Other

## 2020-02-14 DIAGNOSIS — J441 Chronic obstructive pulmonary disease with (acute) exacerbation: Secondary | ICD-10-CM | POA: Diagnosis not present

## 2020-02-14 DIAGNOSIS — R7989 Other specified abnormal findings of blood chemistry: Secondary | ICD-10-CM | POA: Diagnosis not present

## 2020-02-14 DIAGNOSIS — J9620 Acute and chronic respiratory failure, unspecified whether with hypoxia or hypercapnia: Secondary | ICD-10-CM

## 2020-02-14 LAB — BASIC METABOLIC PANEL
Anion gap: 15 (ref 5–15)
BUN: 21 mg/dL (ref 8–23)
CO2: 29 mmol/L (ref 22–32)
Calcium: 9.4 mg/dL (ref 8.9–10.3)
Chloride: 104 mmol/L (ref 98–111)
Creatinine, Ser: 1.34 mg/dL — ABNORMAL HIGH (ref 0.44–1.00)
GFR, Estimated: 42 mL/min — ABNORMAL LOW (ref >60)
Glucose, Bld: 144 mg/dL — ABNORMAL HIGH (ref 70–99)
Potassium: 3.6 mmol/L (ref 3.5–5.1)
Sodium: 148 mmol/L — ABNORMAL HIGH (ref 135–145)

## 2020-02-14 LAB — TROPONIN I (HIGH SENSITIVITY)
Troponin I (High Sensitivity): 95 ng/L — ABNORMAL HIGH (ref <18)
Troponin I (High Sensitivity): 125 ng/L (ref <18)

## 2020-02-14 LAB — CBC WITH DIFFERENTIAL/PLATELET
Abs Immature Granulocytes: 0.08 10*3/uL — ABNORMAL HIGH (ref 0.00–0.07)
Basophils Absolute: 0 10*3/uL (ref 0.0–0.1)
Basophils Relative: 0 %
Eosinophils Absolute: 0 10*3/uL (ref 0.0–0.5)
Eosinophils Relative: 0 %
HCT: 44.9 % (ref 36.0–46.0)
Hemoglobin: 13.9 g/dL (ref 12.0–15.0)
Immature Granulocytes: 1 %
Lymphocytes Relative: 9 %
Lymphs Abs: 1.1 10*3/uL (ref 0.7–4.0)
MCH: 27.6 pg (ref 26.0–34.0)
MCHC: 31 g/dL (ref 30.0–36.0)
MCV: 89.3 fL (ref 80.0–100.0)
Monocytes Absolute: 0.6 10*3/uL (ref 0.1–1.0)
Monocytes Relative: 5 %
Neutro Abs: 9.9 10*3/uL — ABNORMAL HIGH (ref 1.7–7.7)
Neutrophils Relative %: 85 %
Platelets: 397 10*3/uL (ref 150–400)
RBC: 5.03 MIL/uL (ref 3.87–5.11)
RDW: 16.8 % — ABNORMAL HIGH (ref 11.5–15.5)
WBC: 11.7 10*3/uL — ABNORMAL HIGH (ref 4.0–10.5)
nRBC: 0 % (ref 0.0–0.2)

## 2020-02-14 LAB — GLUCOSE, CAPILLARY
Glucose-Capillary: 103 mg/dL — ABNORMAL HIGH (ref 70–99)
Glucose-Capillary: 115 mg/dL — ABNORMAL HIGH (ref 70–99)
Glucose-Capillary: 119 mg/dL — ABNORMAL HIGH (ref 70–99)
Glucose-Capillary: 124 mg/dL — ABNORMAL HIGH (ref 70–99)
Glucose-Capillary: 128 mg/dL — ABNORMAL HIGH (ref 70–99)
Glucose-Capillary: 128 mg/dL — ABNORMAL HIGH (ref 70–99)
Glucose-Capillary: 137 mg/dL — ABNORMAL HIGH (ref 70–99)

## 2020-02-14 LAB — TRIGLYCERIDES: Triglycerides: 119 mg/dL (ref <150)

## 2020-02-14 LAB — MAGNESIUM: Magnesium: 1.8 mg/dL (ref 1.7–2.4)

## 2020-02-14 LAB — PHOSPHORUS: Phosphorus: 2.9 mg/dL (ref 2.5–4.6)

## 2020-02-14 MED ORDER — FENTANYL CITRATE (PF) 100 MCG/2ML IJ SOLN
INTRAMUSCULAR | Status: AC
  Filled 2020-02-14: qty 2

## 2020-02-14 MED ORDER — FENTANYL CITRATE (PF) 100 MCG/2ML IJ SOLN
INTRAMUSCULAR | Status: AC
Start: 2020-02-14 — End: 2020-02-14
  Filled 2020-02-14: qty 2

## 2020-02-14 MED ORDER — PRAVASTATIN SODIUM 20 MG PO TABS
20.0000 mg | ORAL_TABLET | Freq: Every day | ORAL | Status: DC
  Administered 2020-02-14: 20 mg via ORAL
  Filled 2020-02-14: qty 1

## 2020-02-14 MED ORDER — NITROGLYCERIN 0.4 MG SL SUBL
SUBLINGUAL_TABLET | SUBLINGUAL | Status: AC
  Filled 2020-02-14: qty 1

## 2020-02-14 MED ORDER — METHYLPREDNISOLONE SODIUM SUCC 40 MG IJ SOLR
20.0000 mg | INTRAMUSCULAR | Status: DC
  Administered 2020-02-15: 20 mg via INTRAVENOUS
  Filled 2020-02-14: qty 1

## 2020-02-14 MED ORDER — GABAPENTIN 300 MG PO CAPS: 300.0000 mg | ORAL_CAPSULE | ORAL | Status: DC

## 2020-02-14 MED ORDER — MORPHINE SULFATE (PF) 2 MG/ML IV SOLN
INTRAVENOUS | Status: AC
  Administered 2020-02-14: 2 mg via INTRAMUSCULAR
  Filled 2020-02-14: qty 1

## 2020-02-14 MED ORDER — PANTOPRAZOLE SODIUM 40 MG PO TBEC
40.0000 mg | DELAYED_RELEASE_TABLET | Freq: Two times a day (BID) | ORAL | Status: DC
  Administered 2020-02-14: 40 mg via ORAL
  Filled 2020-02-14: qty 1

## 2020-02-14 MED ORDER — ORAL CARE MOUTH RINSE
15.0000 mL | Freq: Two times a day (BID) | OROMUCOSAL | Status: DC
  Administered 2020-02-14 – 2020-02-16 (×4): 15 mL via OROMUCOSAL

## 2020-02-14 MED ORDER — METOPROLOL TARTRATE 5 MG/5ML IV SOLN
2.5000 mg | Freq: Once | INTRAVENOUS | Status: AC
  Administered 2020-02-14: 2.5 mg via INTRAVENOUS
  Filled 2020-02-14: qty 5

## 2020-02-14 MED ORDER — NITROGLYCERIN 0.4 MG SL SUBL: 0.4000 mg | SUBLINGUAL_TABLET | SUBLINGUAL | Status: DC | PRN

## 2020-02-14 MED ORDER — METOPROLOL TARTRATE 5 MG/5ML IV SOLN
INTRAVENOUS | Status: AC
  Filled 2020-02-14: qty 5

## 2020-02-14 MED ORDER — FENTANYL CITRATE (PF) 100 MCG/2ML IJ SOLN
25.0000 ug | Freq: Once | INTRAMUSCULAR | Status: AC
  Administered 2020-02-14: 25 ug via INTRAVENOUS

## 2020-02-14 MED ORDER — METOPROLOL TARTRATE 5 MG/5ML IV SOLN
5.0000 mg | Freq: Once | INTRAVENOUS | Status: AC
  Administered 2020-02-14: 5 mg via INTRAVENOUS

## 2020-02-14 MED ORDER — FENTANYL CITRATE (PF) 100 MCG/2ML IJ SOLN: 25.0000 ug | Freq: Once | INTRAMUSCULAR | Status: AC

## 2020-02-14 MED ORDER — MORPHINE SULFATE (PF) 2 MG/ML IV SOLN
1.0000 mg | INTRAVENOUS | Status: DC | PRN
  Administered 2020-02-14: 2 mg via INTRAVENOUS
  Filled 2020-02-14: qty 1

## 2020-02-14 MED ORDER — LIDOCAINE VISCOUS HCL 2 % MT SOLN
15.0000 mL | Freq: Once | OROMUCOSAL | Status: AC
  Administered 2020-02-14: 15 mL via ORAL
  Filled 2020-02-14: qty 15

## 2020-02-14 MED ORDER — MAGNESIUM SULFATE IN D5W 1-5 GM/100ML-% IV SOLN
1.0000 g | Freq: Once | INTRAVENOUS | Status: AC
  Administered 2020-02-14: 1 g via INTRAVENOUS
  Filled 2020-02-14: qty 100

## 2020-02-14 MED ORDER — POTASSIUM CHLORIDE 10 MEQ/100ML IV SOLN
10.0000 meq | Freq: Once | INTRAVENOUS | Status: AC
  Administered 2020-02-14: 10 meq via INTRAVENOUS
  Filled 2020-02-14: qty 100

## 2020-02-14 MED ORDER — ASPIRIN EC 81 MG PO TBEC
81.0000 mg | DELAYED_RELEASE_TABLET | Freq: Every day | ORAL | Status: DC
  Administered 2020-02-14: 81 mg via ORAL
  Filled 2020-02-14: qty 1

## 2020-02-14 MED ORDER — CARVEDILOL 12.5 MG PO TABS
25.0000 mg | ORAL_TABLET | Freq: Two times a day (BID) | ORAL | Status: DC
  Administered 2020-02-14 – 2020-02-16 (×5): 25 mg via ORAL
  Filled 2020-02-14 (×5): qty 2

## 2020-02-14 MED ORDER — ALUM & MAG HYDROXIDE-SIMETH 200-200-20 MG/5ML PO SUSP
30.0000 mL | Freq: Once | ORAL | Status: AC
  Administered 2020-02-14: 30 mL via ORAL
  Filled 2020-02-14: qty 30

## 2020-02-14 MED ORDER — CALCIUM CARBONATE ANTACID 500 MG PO CHEW
1.0000 | CHEWABLE_TABLET | Freq: Two times a day (BID) | ORAL | Status: DC | PRN
  Administered 2020-02-14 – 2020-02-16 (×2): 200 mg via ORAL
  Filled 2020-02-14 (×2): qty 1

## 2020-02-14 MED ORDER — BISACODYL 10 MG RE SUPP
10.0000 mg | Freq: Once | RECTAL | Status: AC
  Administered 2020-02-14: 10 mg via RECTAL
  Filled 2020-02-14: qty 1

## 2020-02-14 MED ORDER — POTASSIUM CHLORIDE 20 MEQ PO PACK: 20.0000 meq | PACK | Freq: Once | ORAL | Status: DC

## 2020-02-14 MED ORDER — OXYCODONE HCL ER 10 MG PO T12A
10.0000 mg | EXTENDED_RELEASE_TABLET | Freq: Two times a day (BID) | ORAL | Status: DC
  Administered 2020-02-15 – 2020-02-16 (×3): 10 mg via ORAL
  Filled 2020-02-14 (×4): qty 1

## 2020-02-14 NOTE — Progress Notes (Signed)
Pt with c/o epigastric pain and having intermittent runs of SVT and occasional wide QRS.  Upon arrival at bedside pt moaning stating "help me." Pt with reproducible epigastric pain with palpation and intermittent nausea/vomiting despite scheduled famotidine and prn zofran.  Pt also noted to be hypertensive with sbp 180's.  Therefore, will place order for stat EKG and troponin to r/o cardiology etiology; prn morphine for pain management.  Will continue to monitor and assess pt.  Sonda Rumble, AGNP  Pulmonary/Critical Care Pager [phone] (please enter 7 digits) PCCM Consult Pager [phone] (please enter 7 digits)

## 2020-02-14 NOTE — Care Management (Signed)
This is a no charge note  Pick up from PCCM, Dr. Belia Heman  [age]-year-old lady with past medical history of hypertension, COPD, stroke, hypothyroidism, PVD, CKD, anemia, AVM, who was admitted to ICU due to severe COPD exacerbation in the setting of OSA.  Patient also has dCHF.  Patient was extubated yesterday.  Patient has some abdominal pain today, pending CT scanning.  Will need to pick up this patient tomorrow morning.       Lorretta Harp, MD  [facility]   If 7PM-7AM, please contact night-coverage [email or web address] 02/14/2020, 11:30 AM

## 2020-02-14 NOTE — Progress Notes (Signed)
Patient began complaining of mid epigastric pain w/similar presentation as earlier this morning around 4am. She states pain is 10/10 and feels like heart burn. HR ranging from the 110s-130s w/small runs of vtach into the 160s. Mg 1.8 and K 3.6 this AM. Rhythm appears to be sinus on the monitor w/frequent PACs. CT abdomen/pelvis negative for any acute findings.   --Tums PRN given w/no relief -- KCL and 1g Mg ordered --Sublingual nitro given and reduced pain to 9/10 --Fentanyl IV x1 given and symptoms resolved --Cardiology consulted due to concern for unstable angina    Amanda Cockayne ACNP-BC

## 2020-02-14 NOTE — Evaluation (Addendum)
Clinical/Bedside Swallow Evaluation Patient Details  Name: Kimberly Mccarthy MRN: [id number] Date of Birth: 1956/01/04  Today's Date: 02/14/2020 Time: SLP Start Time (ACUTE ONLY): 0900 SLP Stop Time (ACUTE ONLY): 0950 SLP Time Calculation (min) (ACUTE ONLY): 50 min  Past Medical History:  Past Medical History:  Diagnosis Date  . Accelerated hypertension 09/05/2014  . Anemia   . Anemia   . Arterial disease (HCC) 01/11/2014  . AVM (arteriovenous malformation)   . Carotid artery disease (HCC) 10/31/2015  . Carotid artery stenosis   . Critical ischemia of lower extremity (HCC) 07/18/2014  . H/O blood clots   . Hyperkalemia   . Hypertension   . Hypothyroidism 09/05/2014  . PVD (peripheral vascular disease) (HCC)   . Renal failure   . Stroke [facility])    may 2015  . Thyroid disease    Past Surgical History:  Past Surgical History:  Procedure Laterality Date  . FEMORAL ENDARTERECTOMY Right   . left foot surgery  02/2014  . LEG SURGERY Left    HPI:  64 y.o.femalewith a past medical history of HTN, HPL, chronic pain on oxycodone, hypothyroidism, CKD,PVD,CVA,  +chronic hypoxic respiratory failure2/COPD on BiPAP at night at home who presents via EMS after they were called out for respiratory distress. Per EMS patient had agonal respirations on arrival and was unable provide any history. They provided bag-valve-mask ventilation in route. Shortly after patient arrival sister at bedside who was able to state that the patient does not wear oxygen normally during the day only BiPAP at night and that this morning patient was complaining of severe shortness of breath and seem to be getting more more short of breath until EMS was called. Patient is otherwise not been complaining of any other acute sick symptoms per sister.  Patient with progressive resp failure and distress; Patient was intubated emergently. CTA did NOT reveal PE but shows interstitial edema, and what seem to be loculated effusions  on RT side. CXR (10/19): Increasing bilateral perihilar and basilar infiltration and small left pleural effusion.  Assessment / Plan / Recommendation Clinical Impression  Pt was seen bedside in ICU for swallow evaluation. Pt demonstrated No Overt Clinical s/s of Aspiration. Pt extubated 10/19 (length of extubation 2 days)-- pt current on Bipap ventilator for respiratory support. Throughout session, pt observed to use oral suction frequently to assist w/ management of secretions.  Pt appeared alert & cooperative throughout; pt reports no dysphagia/swallowing issues baseline.   Oral mech exam reveals adequate lingual strength &ROM; adequate labial seal; pt is missing dentition & has full dentures-- no dentures were worn during eval. Gravely/low-intensity vocal quality baseline (unsure of pt vocal quality & respiratory status baseline; noted COPD in chart which can be associated w/ vocal quality changes). No overt clinical s/s of aspiration noted during: 3x/ice chips via spoon, 8x/thin liquid via straw, 5x/puree via spoon, 3x/solids via hand.  Pt required assist for upright positioning in bed; able to self feed all trial consistencies w/ min-mod clinician assistance for self-feeding. Of note, pt's respiratory rate appeared sensitive to any increase in movement: as movement increased, pt's WOB appeared to increase. Clinicians increased assistance w/ feeding when any s/s increased WOB from motor demands of self-feeding were noted (increased WOB/respiratory rate increases risk of aspiration). Due to the noted increased respiratory effort, pt was given min verbal cues to eat slowly & take rest breaks in between PO's. After ~3 bites of softened solids, pt demonstrated some apparent s/s of regurgitation/belching-- unsure of exact cause,  however it appears that pt was expelling excess air. Pt also reported some nausea, and asked clinicians about her "stomach". Pt's apparent nausea limited the study, as the pt refused  most PO's after a limited amount of PO intake.  Thin liquids via straw, ice chips, puree, and softened solids all appeared adequate for oropharyngeal swallow w/ no overt clinical s/s of aspiration noted.   Pt appears at reduced risk of aspiration/Dysphagia following general aspiration precautions & safe swallowing strategies. Recommend Dysphagia 2 diet w/ thin liquids to decrease demands of task of eating and consequently reduce pt WOB/respiratory rate for safer swallowing. Meds whole in puree. Straws OK as long as no s/s aspiration noted during use. Emphasis on safe swallowing strategy of small bites/sips, slow rate, and Rest Breaks between PO's; recommend full supervision at mealtime to monitor WOB & assist w/ self-feeding as needed. MD & NSG updated. Education provided to pt. ST to f/u toleration of diet, education.       SLP Visit Diagnosis: Dysphagia, unspecified (R13.10)    Aspiration Risk  Mild aspiration risk;Risk for inadequate nutrition/hydration    Diet Recommendation    Dysphagia 2 diet w/ thin liquids   Medication Administration: Whole meds with puree    Other  Recommendations Recommended Consults: Consider GI evaluation;Consider esophageal assessment Oral Care Recommendations: Oral care BID   Follow up Recommendations Home health SLP;Outpatient SLP      Frequency and Duration min 1 x/week  2 weeks       Prognosis Prognosis for Safe Diet Advancement: Fair Barriers to Reach Goals: Time post onset;Severity of deficits      Swallow Study   General Date of Onset: 02/12/20 (ICU admit) HPI: 64 y.o.femalewith a past medical history of HTN, HPL, chronic pain on oxycodone, hypothyroidism, CKD,PVD,CVA,  Type of Study: Bedside Swallow Evaluation Previous Swallow Assessment:  (N/a) Diet Prior to this Study: NPO;NG Tube Temperature Spikes Noted: No Respiratory Status: Room air;Ventilator (bipap) History of Recent Intubation: Yes Length of Intubations (days): 2  days Date extubated: 02/13/20 Behavior/Cognition: Alert;Cooperative;Pleasant mood Oral Cavity Assessment: Within Functional Limits Oral Care Completed by SLP: Yes (pt completed oral care) Oral Cavity - Dentition: Dentures, top;Dentures, bottom;Dentures, not available (not wearing dentures at time of eval) Vision: Functional for self-feeding Self-Feeding Abilities: Able to feed self;Needs assist;Needs set up Patient Positioning: Upright in bed Baseline Vocal Quality: Hoarse;Low vocal intensity (gravely vocal quality)    Oral/Motor/Sensory Function Overall Oral Motor/Sensory Function: Within functional limits   Ice Chips Ice chips: Within functional limits Presentation: Self Fed;Spoon Other Comments: 3x   Thin Liquid Thin Liquid: Within functional limits Presentation: Self Fed;Straw Other Comments: 8x    Nectar Thick Nectar Thick Liquid: Not tested   Honey Thick Honey Thick Liquid: Not tested   Puree Puree: Within functional limits Presentation: Self Fed;Spoon Other Comments: 5x   Solid     Solid: Within functional limits Presentation: Self Fed;Spoon Other Comments: 3x      Oliver Pila  Graduate Clinician 02/14/2020,12:02 PM   The information in this patient note, response to treatment, and overall treatment plan developed has been reviewed and agreed upon by this clinician.  Jerilynn Som, MS, CCC-SLP Speech Language Pathologist Rehab Services [phone]

## 2020-02-14 NOTE — Progress Notes (Signed)
CH visited pt. while rounding in ICU and observing pt.'s sister at bedside; pt. intubated, resting; sister shared she and pt. live together --> pt. suddenly had a lot of difficulty breathing and had to be brought to [facility] via EMS; problem is fluid on lungs, sister says, and pt. had been hospitalized for this before.  Sister is praying for pt.'s recovery, talking to her at bedside, planning on staying w/her in hospital for support.  No further needs at this time, but sister grateful for visit.  Ch remains available.

## 2020-02-14 NOTE — Progress Notes (Signed)
CRITICAL CARE NOTE  Brief HPI: 64 y.o.femalewith a past medical history of HTN, HPL, chronic pain on oxycodone, hypothyroidism, CKD,PVD,CVA, +chronic hypoxic respiratory failure2/2COPD on BiPAP at night at home who was admitted 10/18 w/acute hypoxic respiratory failure and required intubation. CTA negative for PE but revealed interstitial edema and loculated effusions on Right side.  10/19 Extubated 10/20 Overnight Severe Epigastric and abdominal pain w/associated N/V, Troponin 93, EKG negative, resolved w/PRN Fentanyl  CC  Respiratory failure improving Abdominal pain/N/V  SUBJECTIVE Tmax 99.3 IOs: -3.1L Severe epigastric and abdominal pain, N/V overnight   BP (!) 166/68   Pulse 83   Temp 99 F (37.2 C)   Resp 18   Ht 5\' 6"  (1.676 m)   Wt 125.9 kg   SpO2 97%   BMI 44.80 kg/m    I/O last 3 completed shifts: In: 890.4 [I.V.:790.4; IV Piggyback:100] Out: 4850 [Urine:4700; Emesis/NG output:150] No intake/output data recorded.  SpO2: 97 % O2 Flow Rate (L/min): 3 L/min FiO2 (%): 45 %  Estimated body mass index is 44.8 kg/m as calculated from the following:   Height as of this encounter: 5\' 6"  (1.676 m).   Weight as of this encounter: 125.9 kg.   REVIEW OF SYSTEMS Currently denying any abdominal pain or nausea currently but reports a poor appetite and scared to eat to experiencing pain she had last night. Denies any shortness of breath    PHYSICAL EXAMINATION:  GENERAL:Resting comfortably in bed NECK: Supple.  PULMONARY: +rhonchi, +wheezing CARDIOVASCULAR: S1 and S2. Regular rate and rhythm. No murmurs, rubs, or gallops.  GASTROINTESTINAL: Soft, nontender, -distended.  Positive bowel sounds.   MUSCULOSKELETAL: No swelling, clubbing, or edema.  NEUROLOGIC: obtunded, GCS<8 SKIN:intact,warm,dry  MEDICATIONS: I have reviewed all medications and confirmed regimen as documented   CULTURE RESULTS   Recent Results (from the past 240 hour(s))  Respiratory Panel  by RT PCR (Flu A&B, Covid) - Nasopharyngeal Swab     Status: None   Collection Time: 02/12/20  9:16 AM   Specimen: Nasopharyngeal Swab  Result Value Ref Range Status   SARS Coronavirus 2 by RT PCR NEGATIVE NEGATIVE Final    Comment: (NOTE) SARS-CoV-2 target nucleic acids are NOT DETECTED.  The SARS-CoV-2 RNA is generally detectable in upper respiratoy specimens during the acute phase of infection. The lowest concentration of SARS-CoV-2 viral copies this assay can detect is 131 copies/mL. A negative result does not preclude SARS-Cov-2 infection and should not be used as the sole basis for treatment or other patient management decisions. A negative result may occur with  improper specimen collection/handling, submission of specimen other than nasopharyngeal swab, presence of viral mutation(s) within the areas targeted by this assay, and inadequate number of viral copies (<131 copies/mL). A negative result must be combined with clinical observations, patient history, and epidemiological information. The expected result is Negative.  Fact Sheet for Patients:   Fact Sheet for Healthcare Providers:  02/14/20  This test is no t yet approved or cleared by the https://www.moore.com/ FDA and  has been authorized for detection and/or diagnosis of SARS-CoV-2 by FDA under an Emergency Use Authorization (EUA). This EUA will remain  in effect (meaning this test can be used) for the duration of the COVID-19 declaration under Section 564(b)(1) of the Act, 21 U.S.C. section 360bbb-3(b)(1), unless the authorization is terminated or revoked sooner.     Influenza A by PCR NEGATIVE NEGATIVE Final   Influenza B by PCR NEGATIVE NEGATIVE Final    Comment: (NOTE) The  Xpert Xpress SARS-CoV-2/FLU/RSV assay is intended as an aid in  the diagnosis of influenza from Nasopharyngeal swab specimens and  should not be used as a sole basis for  treatment. Nasal washings and  aspirates are unacceptable for Xpert Xpress SARS-CoV-2/FLU/RSV  testing.  Fact Sheet for Patients: https://www.moore.com/  Fact Sheet for Healthcare Providers: https://www.young.biz/  This test is not yet approved or cleared by the [place] FDA and  has been authorized for detection and/or diagnosis of SARS-CoV-2 by  FDA under an Emergency Use Authorization (EUA). This EUA will remain  in effect (meaning this test can be used) for the duration of the  Covid-19 declaration under Section 564(b)(1) of the Act, 21  U.S.C. section 360bbb-3(b)(1), unless the authorization is  terminated or revoked. Performed at [facility], [address] [place], [place] [address]   Blood culture (routine x 2)     Status: None (Preliminary result)   Collection Time: 02/12/20  1:15 PM   Specimen: BLOOD  Result Value Ref Range Status   Specimen Description BLOOD BLOOD RIGHT HAND  Final   Special Requests   Final    BOTTLES DRAWN AEROBIC AND ANAEROBIC Blood Culture adequate volume   Culture   Final    NO GROWTH 2 DAYS Performed at [facility], [address], [place], [place] [address]    Report Status PENDING  Incomplete  Blood culture (routine x 2)     Status: None (Preliminary result)   Collection Time: 02/12/20  1:23 PM   Specimen: BLOOD  Result Value Ref Range Status   Specimen Description BLOOD BLOOD LEFT HAND  Final   Special Requests   Final    BOTTLES DRAWN AEROBIC AND ANAEROBIC Blood Culture adequate volume   Culture   Final    NO GROWTH 2 DAYS Performed at [facility], [address], [place], [place] [address]    Report Status PENDING  Incomplete          IMAGING    DG Chest Port 1 View  Result Date: 02/14/2020 CLINICAL DATA:  Shortness of breath EXAM: PORTABLE CHEST 1 VIEW COMPARISON:  02/13/2020 FINDINGS: Interval removal of endotracheal and enteric tubes. Cardiac  enlargement. Bilateral pulmonary infiltrates. No significant change. IMPRESSION: Cardiac enlargement with bilateral pulmonary infiltrates. Electronically Signed   By: Burman Nieves M.D.   On: 02/14/2020 05:39   CMP Latest Ref Rng & Units 02/14/2020 02/13/2020 02/12/2020  Glucose 70 - 99 mg/dL 299(B) 716(R) 678(L)  BUN 8 - 23 mg/dL 21 17 20   Creatinine 0.44 - 1.00 mg/dL ) 3.81(O) 1.75(Z)  Sodium 135 - 145 mmol/L 148(H) 144 139  Potassium 3.5 - 5.1 mmol/L 3.6 3.9 4.0  Chloride 98 - 111 mmol/L 104 106 102  CO2 22 - 32 mmol/L 29 25 24   Calcium 8.9 - 10.3 mg/dL 9.4 9.2 0.25(E)  Total Protein 6.5 - 8.1 g/dL - - 8.0  Total Bilirubin 0.3 - 1.2 mg/dL - - 0.8  Alkaline Phos 38 - 126 U/L - - 95  AST 15 - 41 U/L - - 22  ALT 0 - 44 U/L - - 17      Indwelling Urinary Catheter continued, requirement due to   Reason to continue Indwelling Urinary Catheter strict Intake/Output monitoring for hemodynamic instability         Ventilator continued, requirement due to severe respiratory failure   Ventilator Sedation RASS 0 to -2      ASSESSMENT AND PLAN  Acute Hypoxic and  Hypercapnic Respiratory Failure-Improved/Resolving Hx of Severe COPD and OSA --Extubated 10/19 --continue Bronchodilator Therapy --Decrease Solumedrol to 20mg  IV daily --BIPAP as tolerated at night  Acute systolic heart failure- HFpEF -oxygen as needed -Lasix as tolerated  Acute Kidney Injury --continue Foley Catheter-assess need --Avoid nephrotoxic agents --Follow urine output, BMP --Ensure adequate renal perfusion, optimize oxygenation --Renal dose medications  Abdominal pain, Nausea, Vomiting --CT abdomen ordered to assess for ileus or other acute findings --PRN Zofran  FEN: --No MIVFs --Replace per provider order --NPO diet or liquids if tolerated  Prophylaxis: GI: Pepcid continued for now due to NPO status and inability to take PO DVT: SCDs, Lovenox Bowel: Colace  Family Engagement: Updated  family at bedside Code Status: Full Code Dispo: Can transfer to out of ICU pending CT results     ACNP-BC

## 2020-02-15 ENCOUNTER — Encounter: Payer: Self-pay | Admitting: Internal Medicine

## 2020-02-15 DIAGNOSIS — J810 Acute pulmonary edema: Secondary | ICD-10-CM

## 2020-02-15 DIAGNOSIS — I7090 Unspecified atherosclerosis: Secondary | ICD-10-CM

## 2020-02-15 DIAGNOSIS — E662 Morbid (severe) obesity with alveolar hypoventilation: Secondary | ICD-10-CM

## 2020-02-15 DIAGNOSIS — I160 Hypertensive urgency: Secondary | ICD-10-CM

## 2020-02-15 DIAGNOSIS — J9622 Acute and chronic respiratory failure with hypercapnia: Secondary | ICD-10-CM

## 2020-02-15 DIAGNOSIS — I248 Other forms of acute ischemic heart disease: Secondary | ICD-10-CM

## 2020-02-15 DIAGNOSIS — J9621 Acute and chronic respiratory failure with hypoxia: Secondary | ICD-10-CM | POA: Diagnosis not present

## 2020-02-15 LAB — LIPID PANEL
Cholesterol: 226 mg/dL — ABNORMAL HIGH (ref 0–200)
HDL: 41 mg/dL (ref >40)
LDL Cholesterol: 147 mg/dL — ABNORMAL HIGH (ref 0–99)
Total CHOL/HDL Ratio: 5.5 RATIO
Triglycerides: 192 mg/dL — ABNORMAL HIGH (ref <150)
VLDL: 38 mg/dL (ref 0–40)

## 2020-02-15 LAB — CBC WITH DIFFERENTIAL/PLATELET
Abs Immature Granulocytes: 0.02 10*3/uL (ref 0.00–0.07)
Basophils Absolute: 0 10*3/uL (ref 0.0–0.1)
Basophils Relative: 0 %
Eosinophils Absolute: 0 10*3/uL (ref 0.0–0.5)
Eosinophils Relative: 0 %
HCT: 42.2 % (ref 36.0–46.0)
Hemoglobin: 12.8 g/dL (ref 12.0–15.0)
Immature Granulocytes: 0 %
Lymphocytes Relative: 24 %
Lymphs Abs: 1.9 10*3/uL (ref 0.7–4.0)
MCH: 28.3 pg (ref 26.0–34.0)
MCHC: 30.3 g/dL (ref 30.0–36.0)
MCV: 93.2 fL (ref 80.0–100.0)
Monocytes Absolute: 1.2 10*3/uL — ABNORMAL HIGH (ref 0.1–1.0)
Monocytes Relative: 15 %
Neutro Abs: 4.8 10*3/uL (ref 1.7–7.7)
Neutrophils Relative %: 61 %
Platelets: 319 10*3/uL (ref 150–400)
RBC: 4.53 MIL/uL (ref 3.87–5.11)
RDW: 16.9 % — ABNORMAL HIGH (ref 11.5–15.5)
WBC: 7.9 10*3/uL (ref 4.0–10.5)
nRBC: 0 % (ref 0.0–0.2)

## 2020-02-15 LAB — BASIC METABOLIC PANEL
Anion gap: 10 (ref 5–15)
BUN: 28 mg/dL — ABNORMAL HIGH (ref 8–23)
CO2: 34 mmol/L — ABNORMAL HIGH (ref 22–32)
Calcium: 8.9 mg/dL (ref 8.9–10.3)
Chloride: 104 mmol/L (ref 98–111)
Creatinine, Ser: 1.34 mg/dL — ABNORMAL HIGH (ref 0.44–1.00)
GFR, Estimated: 42 mL/min — ABNORMAL LOW (ref >60)
Glucose, Bld: 108 mg/dL — ABNORMAL HIGH (ref 70–99)
Potassium: 3.7 mmol/L (ref 3.5–5.1)
Sodium: 148 mmol/L — ABNORMAL HIGH (ref 135–145)

## 2020-02-15 LAB — GLUCOSE, CAPILLARY
Glucose-Capillary: 94 mg/dL (ref 70–99)
Glucose-Capillary: 102 mg/dL — ABNORMAL HIGH (ref 70–99)
Glucose-Capillary: 121 mg/dL — ABNORMAL HIGH (ref 70–99)
Glucose-Capillary: 136 mg/dL — ABNORMAL HIGH (ref 70–99)
Glucose-Capillary: 110 mg/dL — ABNORMAL HIGH (ref 70–99)

## 2020-02-15 LAB — TROPONIN I (HIGH SENSITIVITY)
Troponin I (High Sensitivity): 97 ng/L — ABNORMAL HIGH (ref <18)
Troponin I (High Sensitivity): 96 ng/L — ABNORMAL HIGH (ref <18)

## 2020-02-15 LAB — LIPASE, BLOOD: Lipase: 33 U/L (ref 11–51)

## 2020-02-15 LAB — HEPATIC FUNCTION PANEL
ALT: 16 U/L (ref 0–44)
AST: 14 U/L — ABNORMAL LOW (ref 15–41)
Albumin: 3.8 g/dL (ref 3.5–5.0)
Alkaline Phosphatase: 76 U/L (ref 38–126)
Bilirubin, Direct: <0.1 mg/dL (ref 0.0–0.2)
Total Bilirubin: 0.7 mg/dL (ref 0.3–1.2)
Total Protein: 7.2 g/dL (ref 6.5–8.1)

## 2020-02-15 LAB — LACTIC ACID, PLASMA: Lactic Acid, Venous: 1.2 mmol/L (ref 0.5–1.9)

## 2020-02-15 LAB — MAGNESIUM: Magnesium: 2.2 mg/dL (ref 1.7–2.4)

## 2020-02-15 LAB — PHOSPHORUS: Phosphorus: 3.5 mg/dL (ref 2.5–4.6)

## 2020-02-15 LAB — TRIGLYCERIDES: Triglycerides: 190 mg/dL — ABNORMAL HIGH (ref <150)

## 2020-02-15 LAB — HEPARIN LEVEL (UNFRACTIONATED): Heparin Unfractionated: 0.51 IU/mL (ref 0.30–0.70)

## 2020-02-15 MED ORDER — PANTOPRAZOLE SODIUM 40 MG PO TBEC
40.0000 mg | DELAYED_RELEASE_TABLET | Freq: Two times a day (BID) | ORAL | Status: DC
  Administered 2020-02-15 – 2020-02-16 (×4): 40 mg via ORAL
  Filled 2020-02-15 (×4): qty 1

## 2020-02-15 MED ORDER — ASPIRIN EC 81 MG PO TBEC
81.0000 mg | DELAYED_RELEASE_TABLET | Freq: Every day | ORAL | Status: DC
  Administered 2020-02-15: 81 mg via ORAL
  Filled 2020-02-15: qty 1

## 2020-02-15 MED ORDER — FUROSEMIDE 10 MG/ML IJ SOLN
40.0000 mg | Freq: Once | INTRAMUSCULAR | Status: AC
  Administered 2020-02-15: 40 mg via INTRAVENOUS
  Filled 2020-02-15: qty 4

## 2020-02-15 MED ORDER — TRAMADOL HCL 50 MG PO TABS: 50.0000 mg | ORAL_TABLET | Freq: Four times a day (QID) | ORAL | Status: DC | PRN

## 2020-02-15 MED ORDER — ISOSORBIDE MONONITRATE ER 30 MG PO TB24
30.0000 mg | ORAL_TABLET | Freq: Once | ORAL | Status: DC
  Filled 2020-02-15: qty 1

## 2020-02-15 MED ORDER — SENNOSIDES-DOCUSATE SODIUM 8.6-50 MG PO TABS
1.0000 | ORAL_TABLET | Freq: Two times a day (BID) | ORAL | Status: DC
  Administered 2020-02-16: 1 via ORAL
  Filled 2020-02-15 (×2): qty 1

## 2020-02-15 MED ORDER — POTASSIUM CHLORIDE CRYS ER 20 MEQ PO TBCR
40.0000 meq | EXTENDED_RELEASE_TABLET | Freq: Once | ORAL | Status: AC
  Administered 2020-02-15: 40 meq via ORAL
  Filled 2020-02-15: qty 2

## 2020-02-15 MED ORDER — AMLODIPINE BESYLATE 5 MG PO TABS: 5.0000 mg | ORAL_TABLET | Freq: Every day | ORAL | Status: DC

## 2020-02-15 MED ORDER — ALUM & MAG HYDROXIDE-SIMETH 200-200-20 MG/5ML PO SUSP
30.0000 mL | Freq: Once | ORAL | Status: AC
  Administered 2020-02-15: 30 mL via ORAL
  Filled 2020-02-15: qty 30

## 2020-02-15 MED ORDER — ATORVASTATIN CALCIUM 20 MG PO TABS
40.0000 mg | ORAL_TABLET | Freq: Every day | ORAL | Status: DC
  Administered 2020-02-15: 40 mg via ORAL
  Filled 2020-02-15: qty 2

## 2020-02-15 MED ORDER — ISOSORBIDE MONONITRATE ER 30 MG PO TB24
60.0000 mg | ORAL_TABLET | Freq: Every day | ORAL | Status: DC
  Administered 2020-02-16: 60 mg via ORAL

## 2020-02-15 MED ORDER — MORPHINE SULFATE (PF) 2 MG/ML IV SOLN: 1.0000 mg | INTRAVENOUS | Status: DC | PRN

## 2020-02-15 MED ORDER — NITROGLYCERIN 2 % TD OINT
1.5000 [in_us] | TOPICAL_OINTMENT | Freq: Four times a day (QID) | TRANSDERMAL | Status: DC
  Administered 2020-02-15 – 2020-02-16 (×4): 1.5 [in_us] via TOPICAL
  Filled 2020-02-15 (×4): qty 1

## 2020-02-15 MED ORDER — CYCLOBENZAPRINE HCL 10 MG PO TABS
10.0000 mg | ORAL_TABLET | Freq: Every evening | ORAL | Status: DC | PRN
  Filled 2020-02-15: qty 1

## 2020-02-15 MED ORDER — HEPARIN (PORCINE) 25000 UT/250ML-% IV SOLN
1150.0000 [IU]/h | INTRAVENOUS | Status: DC
  Administered 2020-02-15 – 2020-02-16 (×2): 1150 [IU]/h via INTRAVENOUS
  Filled 2020-02-15 (×2): qty 250

## 2020-02-15 MED ORDER — HEPARIN BOLUS VIA INFUSION
4000.0000 [IU] | Freq: Once | INTRAVENOUS | Status: AC
  Administered 2020-02-15: 4000 [IU] via INTRAVENOUS
  Filled 2020-02-15: qty 4000

## 2020-02-15 MED ORDER — LABETALOL HCL 5 MG/ML IV SOLN
5.0000 mg | INTRAVENOUS | Status: DC | PRN
  Administered 2020-02-16 (×2): 10 mg via INTRAVENOUS
  Filled 2020-02-15 (×2): qty 4

## 2020-02-15 MED ORDER — LABETALOL HCL 5 MG/ML IV SOLN
10.0000 mg | Freq: Once | INTRAVENOUS | Status: AC
  Administered 2020-02-15: 10 mg via INTRAVENOUS
  Filled 2020-02-15: qty 4

## 2020-02-15 MED ORDER — DOCUSATE SODIUM 100 MG PO CAPS: 100.0000 mg | ORAL_CAPSULE | Freq: Two times a day (BID) | ORAL | Status: DC

## 2020-02-15 MED ORDER — ENSURE ENLIVE PO LIQD
237.0000 mL | Freq: Two times a day (BID) | ORAL | Status: DC
  Administered 2020-02-15: 237 mL via ORAL

## 2020-02-15 MED ORDER — ISOSORBIDE MONONITRATE ER 30 MG PO TB24
30.0000 mg | ORAL_TABLET | Freq: Every day | ORAL | Status: DC
  Administered 2020-02-15: 30 mg via ORAL
  Filled 2020-02-15: qty 1

## 2020-02-15 MED ORDER — LEVOTHYROXINE SODIUM 50 MCG PO TABS
50.0000 ug | ORAL_TABLET | Freq: Every day | ORAL | Status: DC
  Administered 2020-02-16: 50 ug via ORAL
  Filled 2020-02-15: qty 1

## 2020-02-15 MED ORDER — MORPHINE SULFATE (PF) 2 MG/ML IV SOLN
1.0000 mg | INTRAVENOUS | Status: DC | PRN
  Administered 2020-02-15 – 2020-02-16 (×4): 2 mg via INTRAVENOUS
  Filled 2020-02-15 (×4): qty 1

## 2020-02-15 MED ORDER — SIMETHICONE 80 MG PO CHEW
80.0000 mg | CHEWABLE_TABLET | Freq: Four times a day (QID) | ORAL | Status: DC
  Administered 2020-02-15 – 2020-02-16 (×5): 80 mg via ORAL
  Filled 2020-02-15 (×10): qty 1

## 2020-02-15 NOTE — Consult Note (Signed)
Cardiology Consultation:   Patient ID: Kimberly Mccarthy; [id number]; Oct 22, 1955   Admit date: 02/12/2020 Date of Consult: 02/15/2020  Primary Care Provider: [facility] Primary Cardiologist: [facility] Primary Electrophysiologist:  None   Patient Profile:   Kimberly Mccarthy is a 64 y.o. female with a hx of CVA, chronic hypoxic respiratory failure secondary to COPD secondary to prior tobacco use at 1/2 ppd x 40 years quitting approximately 2 years ago, PAD, CKD stage IIIa, morbid obesity with OSA and likely OHS on BiPAP, HTN, HLD, chronic pain syndrome on narcotic therapy, and hypothyroidism who is being seen today for the evaluation of chest pain at the request of [name], NP.  History of Present Illness:   Kimberly Mccarthy has significant history of PAD, [facility]: Balloon angio of FA-PT RSVG, 10/16/14: RT fem endarterectomy, RT iliac thrombectomy and RT CIA angioplasty and CFA/bypass angioplasty, 8/27- Lt CFA and PFA, Lt femoral to posterior tibial artery bypass with reversed saphenous vein, CFA patchangioplasty/profundaplasty, 01/17/2014: Balloon thrombectomy CFA,EIA,CIA,PFA.  She was recently admitted in 11/2019 with acute on chronic hypercarbic respiratory failure treated with antibiotics, steroids, and nebs.   She was admitted on 02/12/2020 with acute on chronic hypoxic and hypercapnic respiratory failure in the setting of severe COPD exacerbation, OSA and likely OHS. She was intubated for airway protection on 10/18, subsequently extubated on 10/19. Initial EKG showed NSR, 83 bpm, rare PAC, no acute st/t changes. Initial HS-Tn 13 with a delta troponin of 65. BNP 320. Covid negative. CTA chest did not reveal PE though did show interstitial edema and a possible loculated right-sided pleural effusion.   Early in the day on 10/20, she reported epigastric pain that was reproducible to palpation with intermittent nausea and  vomiting. EKG at that time showed sinus tachycardia with global st/t changes concerning for ischemia. Repeat HS-Tn of 95-->125.   CT abdomen/pelvis 10/20 showed no acute findings with noted distal colonic diverticulosis without evidence of acute diverticulitis.  Of note, there was improved aeration of the bilateral lung bases.  Overnight, on 02/14/2020, the patient noted mid epigastric pain that was similar to pain she had experienced earlier in the day. Pain was 10/10. Telemetry was reviewed and showed "HR ranging from the 110s-130s w/small runs of vtach into the 160s." No overnight EKG or HS-Tn available for review. She was given TUMs without improvement. She was given 20 mEq of KCl and 1 g of magnesium. SL NTG was given x 1 with pain improving to 9/10. Ultimately, pain resolved with Fentanyl. Cardiology was consulted for further evaluation of chest pain. Repeat stat HS-Tn showed a downtrending value of 97.  Repeat stat EKG is pending at this time.   It appears these tachycardic rates possibly occurred in the setting of the patient bearing down and attempted to have a BM with noted constipation.  She does report a long history of intermittent tachypalpitations with associated dyspnea.  Currently, she continues to complain of epigastric discomfort that is reproducible to palpation.  At times she has been unable to tolerate PTA BiPAP secondary to nausea, vomiting.  She remains on supplemental oxygen via nasal cannula at 3 L.  Orthopnea persists.  Past Medical History:  Diagnosis Date  . Anemia   . AVM (arteriovenous malformation)   . Carotid artery disease (HCC) 10/31/2015  . Chronic respiratory failure (HCC)   . COPD (chronic obstructive pulmonary disease) (HCC)   . Critical ischemia of lower  extremity (HCC) 07/18/2014  . H/O blood clots   . Hyperkalemia   . Hypertension   . Hypothyroidism 09/05/2014  . Morbid obesity (HCC)   . PVD (peripheral vascular disease) (HCC)   . Renal failure   .  Stroke (HCC)    may 2015  . Thyroid disease     Past Surgical History:  Procedure Laterality Date  . FEMORAL ENDARTERECTOMY Right   . left foot surgery  02/2014  . LEG SURGERY Left      Home Meds: Prior to Admission medications   Medication Sig Start Date End Date Taking? Authorizing Provider  albuterol (PROVENTIL) (2.5 MG/3ML) 0.083% nebulizer solution Take 3 mLs (2.5 mg total) by nebulization every 6 (six) hours as needed for shortness of breath. 12/03/19  Yes Wieting, Richard, MD  amLODipine (NORVASC) 10 MG tablet Take 10 mg by mouth daily.   Yes [provider]  aspirin EC 81 MG tablet Take 81 mg by mouth daily.   Yes [provider]  carvedilol (COREG) 25 MG tablet Take 25 mg by mouth 2 (two) times daily with a meal.   Yes [provider]  cloNIDine (CATAPRES) 0.3 MG tablet Take 0.3 mg by mouth 2 (two) times daily.   Yes [provider]  cyclobenzaprine (FLEXERIL) 10 MG tablet Take 10 mg by mouth at bedtime as needed for muscle spasms.  11/08/19  Yes [provider]  docusate sodium (COLACE) 100 MG capsule Take 100 mg by mouth 2 (two) times daily.   Yes [provider]  feeding supplement, ENSURE ENLIVE, (ENSURE ENLIVE) LIQD Take 237 mLs by mouth 2 (two) times daily between meals. 12/03/19  Yes Wieting, Richard, MD  ferrous sulfate 325 (65 FE) MG tablet Take 325 mg by mouth 3 (three) times daily with meals.    Yes [provider]  fluticasone-salmeterol (ADVAIR HFA) 115-21 MCG/ACT inhaler Inhale 2 puffs into the lungs 2 (two) times daily. 12/03/19  Yes Wieting, Richard, MD  gabapentin (NEURONTIN) 300 MG capsule Take 300 mg by mouth See admin instructions. Take 1 capsule (300mg) by mouth every morning, 1 capsule (300mg) every afternoon and take 2 capsules (600mg) by mouth every night at bedtime   Yes [provider]  hydrALAZINE (APRESOLINE) 50 MG tablet Take 50 mg by mouth 3 (three) times daily. 05/01/15  Yes [provider]  ketoconazole (NIZORAL) 2 % cream Apply 1 application topically as directed.   Yes [provider]  levothyroxine (SYNTHROID, LEVOTHROID) 50 MCG tablet Take 50 mcg by mouth daily.   Yes [provider]  oxyCODONE (OXYCONTIN) 10 mg 12 hr tablet Take 10 mg by mouth every 12 (twelve) hours.    Yes [provider]  pantoprazole (PROTONIX) 40 MG tablet Take 40 mg by mouth 2 (two) times daily.   Yes [provider]  pravastatin (PRAVACHOL) 20 MG tablet Take 20 mg by mouth daily. 05/01/15  Yes [provider]  PROAIR HFA 108 (90 Base) MCG/ACT inhaler Inhale 1-2 puffs into the lungs every 6 (six) hours as needed for wheezing or shortness of breath.    Yes [provider]  Vitamin D, Ergocalciferol, (DRISDOL) 1.25 MG (50000 UNIT) CAPS capsule Take 50,000 Units by mouth once a week. 07/05/19  Yes [provider]  zolpidem (AMBIEN) 5 MG tablet Take 5 mg by mouth at bedtime as needed for sleep.  11/08/19  Yes [provider]    Inpatient Medications: Scheduled Meds: . aspirin EC  81 mg   Oral Daily  . carvedilol  25 mg Oral BID WC  . Chlorhexidine Gluconate Cloth  6 each Topical Daily  . cloNIDine  0.3 mg Transdermal Weekly  . docusate  100 mg Per Tube BID  . enoxaparin (LOVENOX) injection  0.5 mg/kg Subcutaneous Q24H  . insulin aspart  0-15 Units Subcutaneous Q4H  . mouth rinse  15 mL Mouth Rinse BID  . methylPREDNISolone (SOLU-MEDROL) injection  20 mg Intravenous Q24H  . oxyCODONE  10 mg Oral Q12H  . pantoprazole  40 mg Oral BID  . polyethylene glycol  17 g Per Tube Daily  . pravastatin  20 mg Oral Daily  . sodium chloride flush  3 mL Intravenous Q12H   Continuous Infusions: . sodium chloride     PRN Meds: sodium chloride, acetaminophen (TYLENOL) oral liquid 160 mg/5 mL, calcium carbonate, hydrALAZINE, morphine injection, nitroGLYCERIN, ondansetron (ZOFRAN) IV, polyethylene glycol, simethicone, sodium chloride  flush  Allergies:  No Known Allergies  Social History:   Social History   Socioeconomic History  . Marital status: Single    Spouse name: Not on file  . Number of children: Not on file  . Years of education: Not on file  . Highest education level: Not on file  Occupational History  . Not on file  Tobacco Use  . Smoking status: Former Smoker    Packs/day: 0.33    Years: 20.00    Pack years: 6.60    Types: Cigarettes  . Smokeless tobacco: Never Used  Substance and Sexual Activity  . Alcohol use: No    Alcohol/week: 0.0 standard drinks  . Drug use: No  . Sexual activity: Not on file  Other Topics Concern  . Not on file  Social History Narrative  . Not on file   Social Determinants of Health   Financial Resource Strain:   . Difficulty of Paying Living Expenses: Not on file  Food Insecurity:   . Worried About Programme researcher, broadcasting/film/video in the Last Year: Not on file  . Ran Out of Food in the Last Year: Not on file  Transportation Needs:   . Lack of Transportation (Medical): Not on file  . Lack of Transportation (Non-Medical): Not on file  Physical Activity:   . Days of Exercise per Week: Not on file  . Minutes of Exercise per Session: Not on file  Stress:   . Feeling of Stress : Not on file  Social Connections:   . Frequency of Communication with Friends and Family: Not on file  . Frequency of Social Gatherings with Friends and Family: Not on file  . Attends Religious Services: Not on file  . Active Member of Clubs or Organizations: Not on file  . Attends Banker Meetings: Not on file  . Marital Status: Not on file  Intimate Partner Violence:   . Fear of Current or Ex-Partner: Not on file  . Emotionally Abused: Not on file  . Physically Abused: Not on file  . Sexually Abused: Not on file     Family History:   Family History  Problem Relation Age of Onset  . Diabetes Mother   . Hypertension Mother   . Hypertension Sister   . Thyroid disease Sister     . Breast cancer Neg Hx     ROS:  Review of Systems  Constitutional: Positive for malaise/fatigue. Negative for chills, diaphoresis, fever and weight loss.  HENT: Negative for congestion.   Eyes: Negative for discharge and redness.  Respiratory: Positive  for cough, shortness of breath and wheezing. Negative for hemoptysis and sputum production.   Cardiovascular: Positive for chest pain, orthopnea and leg swelling. Negative for palpitations, claudication and PND.  Gastrointestinal: Positive for abdominal pain, constipation, nausea and vomiting. Negative for blood in stool, diarrhea, heartburn and melena.  Genitourinary: Negative for hematuria.  Musculoskeletal: Negative for falls and myalgias.  Skin: Negative for rash.  Neurological: Positive for weakness. Negative for dizziness, tingling, tremors, sensory change, speech change, focal weakness and loss of consciousness.  Endo/Heme/Allergies: Does not bruise/bleed easily.  Psychiatric/Behavioral: Negative for substance abuse. The patient is not nervous/anxious.   All other systems reviewed and are negative.     Physical Exam/Data:   Vitals:   02/15/20 0400 02/15/20 0700 02/15/20 0757 02/15/20 0800  BP:    (!) 167/95  Pulse:  (!) 59 69 84  Resp:  17  13  Temp:  98.1 F (36.7 C)  98.1 F (36.7 C)  TempSrc: Oral     SpO2:  98%  97%  Weight:      Height:        Intake/Output Summary (Last 24 hours) at 02/15/2020 0852 Last data filed at 02/15/2020 0400 Gross per 24 hour  Intake --  Output 1250 ml  Net -1250 ml   Filed Weights   02/12/20 0907 02/14/20 0424  Weight: 125.2 kg 125.9 kg   Body mass index is 44.8 kg/m.   Physical Exam: General: Well developed, well nourished, in no acute distress. Head: Normocephalic, atraumatic, sclera non-icteric, no xanthomas, nares without discharge.  Neck: Negative for carotid bruits. JVD difficult to assess secondary to body habitus. Lungs: Diminished breath sounds bilaterally with  respirations mildly labored. Heart: RRR with S1 S2. No murmurs, rubs, or gallops appreciated. Abdomen: Soft, palpation of the epigastrium reproduces her discomfort, non-distended with normoactive bowel sounds. No hepatomegaly. No rebound/guarding. No obvious abdominal masses. Msk:  Strength and tone appear normal for age. Extremities: No clubbing or cyanosis. No edema. Distal pedal pulses are 2+ and equal bilaterally. Neuro: Alert and oriented X 3. No facial asymmetry. No focal deficit. Moves all extremities spontaneously. Psych:  Responds to questions appropriately with a normal affect.   EKG:  The EKG was personally reviewed and demonstrates: 02/12/2020 - NSR, 83 bpm, rare PAC, no acute st/t changes. 10/20 - sinus tachycardia, 109 bpm, possible prior anterior infarct, early repolarization, global st/t changes. Repeat stat EKG this morning given chest pain overnight is pending. Telemetry:  Telemetry was personally reviewed and demonstrates: SR with short runs of narrow complex tachycardia consistent with possible atrial tach versus SVT, 6 beats NSVT  Weights: Filed Weights   02/12/20 0907 02/14/20 0424  Weight: 125.2 kg 125.9 kg    Relevant CV Studies:  2D echo 02/12/2020: 1. Left ventricular ejection fraction, by estimation, is 50 to 55%. The  left ventricle has low normal function. Left ventricular endocardial  border not optimally defined to evaluate regional wall motion. Left  ventricular diastolic parameters are  indeterminate.  2. Right ventricular systolic function is normal. The right ventricular  size is normal. Tricuspid regurgitation signal is inadequate for assessing  PA pressure.  3. The pericardial effusion is anterior to the right ventricle.  4. The mitral valve is normal in structure. No evidence of mitral valve  regurgitation. No evidence of mitral stenosis.  5. The aortic valve is normal in structure. Aortic valve regurgitation is  not visualized. Mild to  moderate aortic valve sclerosis/calcification is  present, without any evidence of  aortic stenosis.  6. The inferior vena cava is normal in size with <50% respiratory  variability, suggesting right atrial pressure of 8 mmHg. __________  2D echo 12/03/2014: - Left ventricle: The cavity size was normal. Systolic function was  normal. The estimated ejection fraction was 65%. Wall motion was  normal; there were no regional wall motion abnormalities. Doppler  parameters are consistent with abnormal left ventricular  relaxation (grade 1 diastolic dysfunction).   Impressions:   - Normal LVEF, mild diastolic dysfunction and normal wallmotion.   Laboratory Data:  Chemistry Recent Labs  Lab 02/13/20 0541 02/14/20 0512 02/15/20 0458  NA 144 148* 148*  K 3.9 3.6 3.7  CL 106 104 104  CO2 25 29 34*  GLUCOSE 124* 144* 108*  BUN 17 21 28*  CREATININE 1.13* 1.34* 1.34*  CALCIUM 9.2 9.4 8.9  GFRNONAA 52* 42* 42*  ANIONGAP 13 15 10     Recent Labs  Lab 02/12/20 0916  PROT 8.0  ALBUMIN 4.2  AST 22  ALT 17  ALKPHOS 95  BILITOT 0.8   Hematology Recent Labs  Lab 02/13/20 0541 02/14/20 0512 02/15/20 0458  WBC 8.9 11.7* 7.9  RBC 4.63 5.03 4.53  HGB 13.1 13.9 12.8  HCT 41.6 44.9 42.2  MCV 89.8 89.3 93.2  MCH 28.3 27.6 28.3  MCHC 31.5 31.0 30.3  RDW 16.0* 16.8* 16.9*  PLT 308 397 319   Cardiac EnzymesNo results for input(s): TROPONINI in the last 168 hours. No results for input(s): TROPIPOC in the last 168 hours.  BNP Recent Labs  Lab 02/12/20 0916  BNP 320.8*    DDimer No results for input(s): DDIMER in the last 168 hours.  Radiology/Studies:  CT ABDOMEN PELVIS WO CONTRAST  Result Date: 02/14/2020 IMPRESSION: 1. No acute findings or explanation for the patient's symptoms. 2. Distal colonic diverticulosis without evidence of acute inflammation. 3. Interval improved aeration of the lung bases compared with recent chest CTA. 4. Aortic Atherosclerosis  (ICD10-I70.0). Electronically Signed   By: 02/16/2020 M.D.   On: 02/14/2020 13:14   DG Abdomen 1 View  Result Date: 02/12/2020 IMPRESSION: The enteric tube tip and side-port are at the stomach. Electronically Signed   By: 02/14/2020 M.D.   On: 02/12/2020 09:43   CT HEAD WO CONTRAST  Result Date: 02/12/2020 IMPRESSION: No evidence of acute intracranial abnormality. Electronically Signed   By: 02/14/2020 MD   On: 02/12/2020 12:36   CT Angio Chest PE W and/or Wo Contrast  Result Date: 02/12/2020 IMPRESSION: No pulmonary embolus is identified although the exam is somewhat degraded by patient motion. Interlobular septal thickening and diffuse hazy bilateral airspace disease most consistent with pulmonary edema in this patient with cardiomegaly and small pleural effusions. Aortic Atherosclerosis (ICD10-I70.0) and Emphysema (ICD10-J43.9). Electronically Signed   By: 02/14/2020 M.D.   On: 02/12/2020 10:17   DG Chest Port 1 View  Result Date: 02/14/2020 IMPRESSION: Cardiac enlargement with bilateral pulmonary infiltrates. Electronically Signed   By: 02/16/2020 M.D.   On: 02/14/2020 05:39   DG Chest Port 1 View  Result Date: 02/13/2020 IMPRESSION: Increasing bilateral perihilar and basilar infiltration and small left pleural effusion. Electronically Signed   By: 02/15/2020 M.D.   On: 02/13/2020 05:06   DG Chest Portable 1 View  Result Date: 02/12/2020 IMPRESSION: 1. Endotracheal tube tip is approximately 4 cm above carina. 2. Orogastric tube courses below the diaphragm outside the field of view. 3. Mild diffuse interstitial prominence and ill-defined  right perihilar opacities, which may represent mild pulmonary edema (favored), aspiration or pneumonia. Electronically Signed   By: Feliberto Harts MD   On: 02/12/2020 09:43    Assessment and Plan:   1. Elevated HS-Tn: -Currently, she continues to note epigastric discomfort that is reproducible to  palpation -Possibly supply demand ischemia in the context of significant coronary artery calcification noted on CT in the setting of AECOPD and runs of narrow complex tachycardia, though unable to definitively exclude ACS given her symptoms, elevated troponin, and abnormal EKG -Echo this admission with low normal LVSF with an EF of 50-55% -Patient with two separate episodes of epigastric pain on 10/20 as previously outlined.  Repeat troponin this morning downtrending with repeat EKG pending -After discussion with MD, start heparin drip -A1c 6.1 this admission -Check lipid panel for further risk stratification  -NPO at midnight for possible [facility] 10/22, if able to lay supine -Risks and benefits of cardiac catheterization have been discussed with the patient including risks of bleeding, bruising, infection, kidney damage, stroke, heart attack, and death. The patient understands these risks and is willing to proceed with the procedure. All questions have been answered and concerns listened to  2. Narrow complex tachycardia: -Reviewed with MD, appears to be consistent with atrial tachycardia/SVT -Possibly contributing to her overall presentation  -Monitor on telemetry  -Coreg  3. Acute on chronic hypoxic and hypercapnic respiratory failure: -Intubated 10/18-->extubated 10/19 -Secondary to severe COPD exacerbation, morbid obesity with OSA and likely OHS -Cannot exclude some degree of volume overload/diastolic CHF -Give one-time dose of IV Lasix 40 mg this morning  -Consider ABG to assess CO2 -PTA BiPAP  -Management per primary   4. CKD stage II: -Stable -Avoid nephrotoxic agents  5. PAD: -Follow up with [facility] as directed in the outpatient setting -ASA -Consider escalation of statin therapy   6. HTN: -BP is suboptimally controlled -Added Imdur this morning -IV Lasix as above -Morphine has been increased as well -Continue Coreg and clonidine patch -Reassess later today after she has  gotten the above medications    For questions or updates, please contact [facility] Please consult [email or web address] for contact info under Cardiology/STEMI.   Signed, Eula Listen, PA-C [facility] Pager: [phone] 02/15/2020, 8:52 AM

## 2020-02-15 NOTE — H&P (View-Only) (Signed)
Cardiology Consultation:   Patient ID: Kimberly Mccarthy Kicklighter; [id number]; Oct 22, 1955   Admit date: 02/12/2020 Date of Consult: 02/15/2020  Primary Care Provider: [facility] Primary Cardiologist: [facility] Primary Electrophysiologist:  None   Patient Profile:   Kimberly Mccarthy [place] is a 64 y.o. female with a hx of CVA, chronic hypoxic respiratory failure secondary to COPD secondary to prior tobacco use at 1/2 ppd x 40 years quitting approximately 2 years ago, PAD, CKD stage IIIa, morbid obesity with OSA and likely OHS on BiPAP, HTN, HLD, chronic pain syndrome on narcotic therapy, and hypothyroidism who is being seen today for the evaluation of chest pain at the request of [name], NP.  History of Present Illness:   [name] has significant history of PAD, [facility]: Balloon angio of FA-PT RSVG, 10/16/14: RT fem endarterectomy, RT iliac thrombectomy and RT CIA angioplasty and CFA/bypass angioplasty, 8/27- Lt CFA and PFA, Lt femoral to posterior tibial artery bypass with reversed saphenous vein, CFA patchangioplasty/profundaplasty, 01/17/2014: Balloon thrombectomy CFA,EIA,CIA,PFA.  She was recently admitted in 11/2019 with acute on chronic hypercarbic respiratory failure treated with antibiotics, steroids, and nebs.   She was admitted on 02/12/2020 with acute on chronic hypoxic and hypercapnic respiratory failure in the setting of severe COPD exacerbation, OSA and likely OHS. She was intubated for airway protection on 10/18, subsequently extubated on 10/19. Initial EKG showed NSR, 83 bpm, rare PAC, no acute st/t changes. Initial HS-Tn 13 with a delta troponin of 65. BNP 320. Covid negative. CTA chest did not reveal PE though did show interstitial edema and a possible loculated right-sided pleural effusion.   Early in the day on 10/20, she reported epigastric pain that was reproducible to palpation with intermittent nausea and  vomiting. EKG at that time showed sinus tachycardia with global st/t changes concerning for ischemia. Repeat HS-Tn of 95-->125.   CT abdomen/pelvis 10/20 showed no acute findings with noted distal colonic diverticulosis without evidence of acute diverticulitis.  Of note, there was improved aeration of the bilateral lung bases.  Overnight, on 02/14/2020, the patient noted mid epigastric pain that was similar to pain she had experienced earlier in the day. Pain was 10/10. Telemetry was reviewed and showed "HR ranging from the 110s-130s w/small runs of vtach into the 160s." No overnight EKG or HS-Tn available for review. She was given TUMs without improvement. She was given 20 mEq of KCl and 1 g of magnesium. SL NTG was given x 1 with pain improving to 9/10. Ultimately, pain resolved with Fentanyl. Cardiology was consulted for further evaluation of chest pain. Repeat stat HS-Tn showed a downtrending value of 97.  Repeat stat EKG is pending at this time.   It appears these tachycardic rates possibly occurred in the setting of the patient bearing down and attempted to have a BM with noted constipation.  She does report a long history of intermittent tachypalpitations with associated dyspnea.  Currently, she continues to complain of epigastric discomfort that is reproducible to palpation.  At times she has been unable to tolerate PTA BiPAP secondary to nausea, vomiting.  She remains on supplemental oxygen via nasal cannula at 3 L.  Orthopnea persists.  Past Medical History:  Diagnosis Date  . Anemia   . AVM (arteriovenous malformation)   . Carotid artery disease (HCC) 10/31/2015  . Chronic respiratory failure (HCC)   . COPD (chronic obstructive pulmonary disease) (HCC)   . Critical ischemia of lower  extremity (HCC) 07/18/2014  . H/O blood clots   . Hyperkalemia   . Hypertension   . Hypothyroidism 09/05/2014  . Morbid obesity (HCC)   . PVD (peripheral vascular disease) (HCC)   . Renal failure   .  Stroke [facility])    may 2015  . Thyroid disease     Past Surgical History:  Procedure Laterality Date  . FEMORAL ENDARTERECTOMY Right   . left foot surgery  02/2014  . LEG SURGERY Left      Home Meds: Prior to Admission medications   Medication Sig Start Date End Date Taking? Authorizing Provider  albuterol (PROVENTIL) (2.5 MG/3ML) 0.083% nebulizer solution Take 3 mLs (2.5 mg total) by nebulization every 6 (six) hours as needed for shortness of breath. 12/03/19  Yes Wieting, Richard, MD  amLODipine (NORVASC) 10 MG tablet Take 10 mg by mouth daily.   Yes [provider]  aspirin EC 81 MG tablet Take 81 mg by mouth daily.   Yes [provider]  carvedilol (COREG) 25 MG tablet Take 25 mg by mouth 2 (two) times daily with a meal.   Yes [provider]  cloNIDine (CATAPRES) 0.3 MG tablet Take 0.3 mg by mouth 2 (two) times daily.   Yes [provider]  cyclobenzaprine (FLEXERIL) 10 MG tablet Take 10 mg by mouth at bedtime as needed for muscle spasms.  11/08/19  Yes [provider]  docusate sodium (COLACE) 100 MG capsule Take 100 mg by mouth 2 (two) times daily.   Yes [provider]  feeding supplement, ENSURE ENLIVE, (ENSURE ENLIVE) LIQD Take 237 mLs by mouth 2 (two) times daily between meals. 12/03/19  Yes Wieting, Richard, MD  ferrous sulfate 325 (65 FE) MG tablet Take 325 mg by mouth 3 (three) times daily with meals.    Yes [provider]  fluticasone-salmeterol (ADVAIR HFA) 115-21 MCG/ACT inhaler Inhale 2 puffs into the lungs 2 (two) times daily. 12/03/19  Yes Wieting, Richard, MD  gabapentin (NEURONTIN) 300 MG capsule Take 300 mg by mouth See admin instructions. Take 1 capsule ( ) by mouth every morning, 1 capsule ( ) every afternoon and take 2 capsules ( ) by mouth every night at bedtime   Yes [provider]  hydrALAZINE (APRESOLINE) 50 MG tablet Take 50 mg by mouth 3 (three) times daily. 05/01/15  Yes [provider]  ketoconazole (NIZORAL) 2 % cream Apply 1 application topically as directed.   Yes [provider]  levothyroxine (SYNTHROID, LEVOTHROID) 50 MCG tablet Take 50 mcg by mouth daily.   Yes [provider]  oxyCODONE (OXYCONTIN) 10 mg 12 hr tablet Take 10 mg by mouth every 12 (twelve) hours.    Yes [provider]  pantoprazole (PROTONIX) 40 MG tablet Take 40 mg by mouth 2 (two) times daily.   Yes [provider]  pravastatin (PRAVACHOL) 20 MG tablet Take 20 mg by mouth daily. 05/01/15  Yes [provider]  PROAIR HFA 108 (90 Base) MCG/ACT inhaler Inhale 1-2 puffs into the lungs every 6 (six) hours as needed for wheezing or shortness of breath.    Yes [provider]  Vitamin D, Ergocalciferol, (DRISDOL) 1.25 MG (50000 UNIT) CAPS capsule Take 50,000 Units by mouth once a week. 07/05/19  Yes [provider]  zolpidem (AMBIEN) 5 MG tablet Take 5 mg by mouth at bedtime as needed for sleep.  11/08/19  Yes [provider]    Inpatient Medications: Scheduled Meds: . aspirin EC  81 mg  Oral Daily  . carvedilol  25 mg Oral BID WC  . Chlorhexidine Gluconate Cloth  6 each Topical Daily  . cloNIDine  0.3 mg Transdermal Weekly  . docusate  100 mg Per Tube BID  . enoxaparin (LOVENOX) injection  0.5 mg/kg Subcutaneous Q24H  . insulin aspart  0-15 Units Subcutaneous Q4H  . mouth rinse  15 mL Mouth Rinse BID  . methylPREDNISolone (SOLU-MEDROL) injection  20 mg Intravenous Q24H  . oxyCODONE  10 mg Oral Q12H  . pantoprazole  40 mg Oral BID  . polyethylene glycol  17 g Per Tube Daily  . pravastatin  20 mg Oral Daily  . sodium chloride flush  3 mL Intravenous Q12H   Continuous Infusions: . sodium chloride     PRN Meds: sodium chloride, acetaminophen (TYLENOL) oral liquid 160 mg/5 mL, calcium carbonate, hydrALAZINE, morphine injection, nitroGLYCERIN, ondansetron (ZOFRAN) IV, polyethylene glycol, simethicone, sodium chloride  flush  Allergies:  No Known Allergies  Social History:   Social History   Socioeconomic History  . Marital status: Single    Spouse name: Not on file  . Number of children: Not on file  . Years of education: Not on file  . Highest education level: Not on file  Occupational History  . Not on file  Tobacco Use  . Smoking status: Former Smoker    Packs/day: 0.33    Years: 20.00    Pack years: 6.60    Types: Cigarettes  . Smokeless tobacco: Never Used  Substance and Sexual Activity  . Alcohol use: No    Alcohol/week: 0.0 standard drinks  . Drug use: No  . Sexual activity: Not on file  Other Topics Concern  . Not on file  Social History Narrative  . Not on file   Social Determinants of Health   Financial Resource Strain:   . Difficulty of Paying Living Expenses: Not on file  Food Insecurity:   . Worried About Programme researcher, broadcasting/film/video in the Last Year: Not on file  . Ran Out of Food in the Last Year: Not on file  Transportation Needs:   . Lack of Transportation (Medical): Not on file  . Lack of Transportation (Non-Medical): Not on file  Physical Activity:   . Days of Exercise per Week: Not on file  . Minutes of Exercise per Session: Not on file  Stress:   . Feeling of Stress : Not on file  Social Connections:   . Frequency of Communication with Friends and Family: Not on file  . Frequency of Social Gatherings with Friends and Family: Not on file  . Attends Religious Services: Not on file  . Active Member of Clubs or Organizations: Not on file  . Attends Banker Meetings: Not on file  . Marital Status: Not on file  Intimate Partner Violence:   . Fear of Current or Ex-Partner: Not on file  . Emotionally Abused: Not on file  . Physically Abused: Not on file  . Sexually Abused: Not on file     Family History:   Family History  Problem Relation Age of Onset  . Diabetes Mother   . Hypertension Mother   . Hypertension Sister   . Thyroid disease Sister     . Breast cancer Neg Hx     ROS:  Review of Systems  Constitutional: Positive for malaise/fatigue. Negative for chills, diaphoresis, fever and weight loss.  HENT: Negative for congestion.   Eyes: Negative for discharge and redness.  Respiratory: Positive  for cough, shortness of breath and wheezing. Negative for hemoptysis and sputum production.   Cardiovascular: Positive for chest pain, orthopnea and leg swelling. Negative for palpitations, claudication and PND.  Gastrointestinal: Positive for abdominal pain, constipation, nausea and vomiting. Negative for blood in stool, diarrhea, heartburn and melena.  Genitourinary: Negative for hematuria.  Musculoskeletal: Negative for falls and myalgias.  Skin: Negative for rash.  Neurological: Positive for weakness. Negative for dizziness, tingling, tremors, sensory change, speech change, focal weakness and loss of consciousness.  Endo/Heme/Allergies: Does not bruise/bleed easily.  Psychiatric/Behavioral: Negative for substance abuse. The patient is not nervous/anxious.   All other systems reviewed and are negative.     Physical Exam/Data:   Vitals:   02/15/20 0400 02/15/20 0700 02/15/20 0757 02/15/20 0800  BP:    (!) 167/95  Pulse:  (!) 59 69 84  Resp:  17  13  Temp:  98.1 F (36.7 C)  98.1 F (36.7 C)  TempSrc: Oral     SpO2:  98%  97%  Weight:      Height:        Intake/Output Summary (Last 24 hours) at 02/15/2020 0852 Last data filed at 02/15/2020 0400 Gross per 24 hour  Intake --  Output 1250 ml  Net -1250 ml   Filed Weights   02/12/20 0907 02/14/20 0424  Weight: 125.2 kg 125.9 kg   Body mass index is 44.8 kg/m.   Physical Exam: General: Well developed, well nourished, in no acute distress. Head: Normocephalic, atraumatic, sclera non-icteric, no xanthomas, nares without discharge.  Neck: Negative for carotid bruits. JVD difficult to assess secondary to body habitus. Lungs: Diminished breath sounds bilaterally with  respirations mildly labored. Heart: RRR with S1 S2. No murmurs, rubs, or gallops appreciated. Abdomen: Soft, palpation of the epigastrium reproduces her discomfort, non-distended with normoactive bowel sounds. No hepatomegaly. No rebound/guarding. No obvious abdominal masses. Msk:  Strength and tone appear normal for age. Extremities: No clubbing or cyanosis. No edema. Distal pedal pulses are 2+ and equal bilaterally. Neuro: Alert and oriented X 3. No facial asymmetry. No focal deficit. Moves all extremities spontaneously. Psych:  Responds to questions appropriately with a normal affect.   EKG:  The EKG was personally reviewed and demonstrates: 02/12/2020 - NSR, 83 bpm, rare PAC, no acute st/t changes. 10/20 - sinus tachycardia, 109 bpm, possible prior anterior infarct, early repolarization, global st/t changes. Repeat stat EKG this morning given chest pain overnight is pending. Telemetry:  Telemetry was personally reviewed and demonstrates: SR with short runs of narrow complex tachycardia consistent with possible atrial tach versus SVT, 6 beats NSVT  Weights: Filed Weights   02/12/20 0907 02/14/20 0424  Weight: 125.2 kg 125.9 kg    Relevant CV Studies:  2D echo 02/12/2020: 1. Left ventricular ejection fraction, by estimation, is 50 to 55%. The  left ventricle has low normal function. Left ventricular endocardial  border not optimally defined to evaluate regional wall motion. Left  ventricular diastolic parameters are  indeterminate.  2. Right ventricular systolic function is normal. The right ventricular  size is normal. Tricuspid regurgitation signal is inadequate for assessing  PA pressure.  3. The pericardial effusion is anterior to the right ventricle.  4. The mitral valve is normal in structure. No evidence of mitral valve  regurgitation. No evidence of mitral stenosis.  5. The aortic valve is normal in structure. Aortic valve regurgitation is  not visualized. Mild to  moderate aortic valve sclerosis/calcification is  present, without any evidence of  aortic stenosis.  6. The inferior vena cava is normal in size with <50% respiratory  variability, suggesting right atrial pressure of 8 mmHg. __________  2D echo 12/03/2014: - Left ventricle: The cavity size was normal. Systolic function was  normal. The estimated ejection fraction was 65%. Wall motion was  normal; there were no regional wall motion abnormalities. Doppler  parameters are consistent with abnormal left ventricular  relaxation (grade 1 diastolic dysfunction).   Impressions:   - Normal LVEF, mild diastolic dysfunction and normal wallmotion.   Laboratory Data:  Chemistry Recent Labs  Lab 02/13/20 0541 02/14/20 0512 02/15/20 0458  NA 144 148* 148*  K 3.9 3.6 3.7  CL 106 104 104  CO2 25 29 34*  GLUCOSE 124* 144* 108*  BUN 17 21 28*  CREATININE 1.13* 1.34* 1.34*  CALCIUM 9.2 9.4 8.9  GFRNONAA 52* 42* 42*  ANIONGAP 13 15 10     Recent Labs  Lab 02/12/20 0916  PROT 8.0  ALBUMIN 4.2  AST 22  ALT 17  ALKPHOS 95  BILITOT 0.8   Hematology Recent Labs  Lab 02/13/20 0541 02/14/20 0512 02/15/20 0458  WBC 8.9 11.7* 7.9  RBC 4.63 5.03 4.53  HGB 13.1 13.9 12.8  HCT 41.6 44.9 42.2  MCV 89.8 89.3 93.2  MCH 28.3 27.6 28.3  MCHC 31.5 31.0 30.3  RDW 16.0* 16.8* 16.9*  PLT 308 397 319   Cardiac EnzymesNo results for input(s): TROPONINI in the last 168 hours. No results for input(s): TROPIPOC in the last 168 hours.  BNP Recent Labs  Lab 02/12/20 0916  BNP 320.8*    DDimer No results for input(s): DDIMER in the last 168 hours.  Radiology/Studies:  CT ABDOMEN PELVIS WO CONTRAST  Result Date: 02/14/2020 IMPRESSION: 1. No acute findings or explanation for the patient's symptoms. 2. Distal colonic diverticulosis without evidence of acute inflammation. 3. Interval improved aeration of the lung bases compared with recent chest CTA. 4. Aortic Atherosclerosis  (ICD10-I70.0). Electronically Signed   By: 02/16/2020 M.D.   On: 02/14/2020 13:14   DG Abdomen 1 View  Result Date: 02/12/2020 IMPRESSION: The enteric tube tip and side-port are at the stomach. Electronically Signed   By: 02/14/2020 M.D.   On: 02/12/2020 09:43   CT HEAD WO CONTRAST  Result Date: 02/12/2020 IMPRESSION: No evidence of acute intracranial abnormality. Electronically Signed   By: 02/14/2020 MD   On: 02/12/2020 12:36   CT Angio Chest PE W and/or Wo Contrast  Result Date: 02/12/2020 IMPRESSION: No pulmonary embolus is identified although the exam is somewhat degraded by patient motion. Interlobular septal thickening and diffuse hazy bilateral airspace disease most consistent with pulmonary edema in this patient with cardiomegaly and small pleural effusions. Aortic Atherosclerosis (ICD10-I70.0) and Emphysema (ICD10-J43.9). Electronically Signed   By: 02/14/2020 M.D.   On: 02/12/2020 10:17   DG Chest Port 1 View  Result Date: 02/14/2020 IMPRESSION: Cardiac enlargement with bilateral pulmonary infiltrates. Electronically Signed   By: 02/16/2020 M.D.   On: 02/14/2020 05:39   DG Chest Port 1 View  Result Date: 02/13/2020 IMPRESSION: Increasing bilateral perihilar and basilar infiltration and small left pleural effusion. Electronically Signed   By: 02/15/2020 M.D.   On: 02/13/2020 05:06   DG Chest Portable 1 View  Result Date: 02/12/2020 IMPRESSION: 1. Endotracheal tube tip is approximately 4 cm above carina. 2. Orogastric tube courses below the diaphragm outside the field of view. 3. Mild diffuse interstitial prominence and ill-defined  right perihilar opacities, which may represent mild pulmonary edema (favored), aspiration or pneumonia. Electronically Signed   By: Feliberto Harts MD   On: 02/12/2020 09:43    Assessment and Plan:   1. Elevated HS-Tn: -Currently, she continues to note epigastric discomfort that is reproducible to  palpation -Possibly supply demand ischemia in the context of significant coronary artery calcification noted on CT in the setting of AECOPD and runs of narrow complex tachycardia, though unable to definitively exclude ACS given her symptoms, elevated troponin, and abnormal EKG -Echo this admission with low normal LVSF with an EF of 50-55% -Patient with two separate episodes of epigastric pain on 10/20 as previously outlined.  Repeat troponin this morning downtrending with repeat EKG pending -After discussion with MD, start heparin drip -A1c 6.1 this admission -Check lipid panel for further risk stratification  -NPO at midnight for possible [facility] 10/22, if able to lay supine -Risks and benefits of cardiac catheterization have been discussed with the patient including risks of bleeding, bruising, infection, kidney damage, stroke, heart attack, and death. The patient understands these risks and is willing to proceed with the procedure. All questions have been answered and concerns listened to  2. Narrow complex tachycardia: -Reviewed with MD, appears to be consistent with atrial tachycardia/SVT -Possibly contributing to her overall presentation  -Monitor on telemetry  -Coreg  3. Acute on chronic hypoxic and hypercapnic respiratory failure: -Intubated 10/18-->extubated 10/19 -Secondary to severe COPD exacerbation, morbid obesity with OSA and likely OHS -Cannot exclude some degree of volume overload/diastolic CHF -Give one-time dose of IV Lasix 40 mg this morning  -Consider ABG to assess CO2 -PTA BiPAP  -Management per primary   4. CKD stage II: -Stable -Avoid nephrotoxic agents  5. PAD: -Follow up with [facility] as directed in the outpatient setting -ASA -Consider escalation of statin therapy   6. HTN: -BP is suboptimally controlled -Added Imdur this morning -IV Lasix as above -Morphine has been increased as well -Continue Coreg and clonidine patch -Reassess later today after she has  gotten the above medications    For questions or updates, please contact [facility] Please consult [email or web address] for contact info under Cardiology/STEMI.   Signed, Eula Listen, PA-C [facility] Pager: [phone] 02/15/2020, 8:52 AM

## 2020-02-15 NOTE — Progress Notes (Signed)
Cardiology notified that pt remains hypertensive after hydralazine administered. BP 196/119.

## 2020-02-15 NOTE — Progress Notes (Signed)
Nitro paste ordered and administered for hypertension. BP 175/90.

## 2020-02-15 NOTE — Progress Notes (Signed)
Cardiology notified of troponin of 131.

## 2020-02-15 NOTE — Progress Notes (Signed)
SLP Cancellation Note  Patient Details Name: Kimberly Mccarthy MRN: [id number] DOB: 05-Feb-1956   Cancelled treatment:       Reason Eval/Treat Not Completed: Medical issues which prohibited therapy (chart reviewed) Noted pt's BP 196/119. Will hold on po trials while MD addressing. Recommend continuing current diet w/ aspiration precautions; oral care. Only give po's when fully alert and engaged in po tasks w/ NSG.     Jerilynn Som, MS, CCC-SLP Speech Language Pathologist Rehab Services [phone] [facility] 02/15/2020, 4:41 PM

## 2020-02-15 NOTE — Progress Notes (Signed)
ANTICOAGULATION CONSULT NOTE  Pharmacy Consult for heparin Indication: chest pain/ACS  No Known Allergies  Patient Measurements: Height: 5\' 6"  (167.6 cm) Weight: 125.9 kg (277 lb 9 oz) IBW/kg (Calculated) : 59.3 Heparin Dosing Weight: 92 kg  Vital Signs: Temp: 99.3 F (37.4 C) (10/21 1700) Temp Source: Bladder (10/21 1400) BP: 177/77 (10/21 1700) Pulse Rate: 71 (10/21 1700)  Labs: Recent Labs    02/13/20 0541 02/13/20 0541 02/14/20 0512 02/14/20 0512 02/14/20 0748 02/15/20 0458 02/15/20 0914 02/15/20 1148 02/15/20 1717  HGB 13.1   < > 13.9  --   --  12.8  --   --   --   HCT 41.6  --  44.9  --   --  42.2  --   --   --   PLT 308  --  397  --   --  319  --   --   --   HEPARINUNFRC  --   --   --   --   --   --   --   --  0.51  CREATININE 1.13*  --  1.34*  --   --  1.34*  --   --   --   TROPONINIHS  --   --  95*   < > 125*  --  97* 96*  --    < > = values in this interval not displayed.    Estimated Creatinine Clearance: 58.3 mL/min (A) (by C-G formula based on SCr of 1.34 mg/dL (H)).   Medical History: Past Medical History:  Diagnosis Date  . Anemia   . AVM (arteriovenous malformation)   . Carotid artery disease (HCC) 10/31/2015  . Chronic respiratory failure (HCC)   . COPD (chronic obstructive pulmonary disease) (HCC)   . Critical ischemia of lower extremity (HCC) 07/18/2014  . H/O blood clots   . Hyperkalemia   . Hypertension   . Hypothyroidism 09/05/2014  . Morbid obesity (HCC)   . PVD (peripheral vascular disease) (HCC)   . Renal failure   . Stroke [facility])    may 2015  . Thyroid disease     Assessment: 64 year old female admitted with PMH COPD, hx smoking 40 years, and hypoxic respiratory failure requiring intubation. Patient later extubated, c/o severe epigastric and abdominal pain. 10/18 CTA chest with no PE. 10/20 CT abd/pel negative for acute findings. EKG with profound ST depression. Cardiology consulted. Plan for cath 10/22. Pharmacy consulted for  heparin dosing and monitoring.  CBC stable  10/21 @1717  HL 0.51, therapeutic x1   Goal of Therapy:  Heparin level 0.3-0.7 units/ml Monitor platelets by anticoagulation protocol: Yes   Plan:  HL therapeutic - will continue current rate at 1150 units/hr Recheck HL in 6 hours to confirm Monitor CBC daily while on heparin drip.  11/21, PharmD Pharmacy Resident  02/15/2020 6:15 PM

## 2020-02-15 NOTE — Progress Notes (Signed)
Md notified that pt is still hypertensive despite cardiology adding nitropaste and increasing lipitor.

## 2020-02-15 NOTE — Progress Notes (Signed)
ANTICOAGULATION CONSULT NOTE  Pharmacy Consult for heparin Indication: chest pain/ACS  No Known Allergies  Patient Measurements: Height: 5\' 6"  (167.6 cm) Weight: 125.9 kg (277 lb 9 oz) IBW/kg (Calculated) : 59.3 Heparin Dosing Weight: 92 kg  Vital Signs: Temp: 98.8 F (37.1 C) (10/21 1200) Temp Source: Bladder (10/21 0800) BP: 184/80 (10/21 1200) Pulse Rate: 78 (10/21 1200)  Labs: Recent Labs    02/13/20 0541 02/13/20 0541 02/14/20 0512 02/14/20 0748 02/15/20 0458 02/15/20 0914  HGB 13.1   < > 13.9  --  12.8  --   HCT 41.6  --  44.9  --  42.2  --   PLT 308  --  397  --  319  --   CREATININE 1.13*  --  1.34*  --  1.34*  --   TROPONINIHS  --   --  95* 125*  --  97*   < > = values in this interval not displayed.    Estimated Creatinine Clearance: 58.3 mL/min (A) (by C-G formula based on SCr of 1.34 mg/dL (H)).   Medical History: Past Medical History:  Diagnosis Date  . Anemia   . AVM (arteriovenous malformation)   . Carotid artery disease (HCC) 10/31/2015  . Chronic respiratory failure (HCC)   . COPD (chronic obstructive pulmonary disease) (HCC)   . Critical ischemia of lower extremity (HCC) 07/18/2014  . H/O blood clots   . Hyperkalemia   . Hypertension   . Hypothyroidism 09/05/2014  . Morbid obesity (HCC)   . PVD (peripheral vascular disease) (HCC)   . Renal failure   . Stroke [facility])    may 2015  . Thyroid disease     Assessment: 64 year old female admitted with hypoxic respiratory failure requiring intubation. Patient later extubated, c/o severe epigastric and abdominal pain. CT abd/pel negative for acute findings. Cardiology consulted. Plan for cath 10/22. Pharmacy to start heparin drip.  Goal of Therapy:  Heparin level 0.3-0.7 units/ml Monitor platelets by anticoagulation protocol: Yes   Plan:  Heparin 4000 unit bolus followed by heparin drip at 1150 units/hr. HL at 1700. CBC daily while on heparin drip.  11/22, PharmD 02/15/2020,12:37  PM

## 2020-02-15 NOTE — Progress Notes (Signed)
[facility] Progress Note  Patient: Kimberly Mccarthy    DGL:[id number]  DOA: 02/12/2020     Date of Service: the patient was seen and examined on 02/15/2020  Brief hospital course: Past medical history of morbid obesity, COPD, chronic hypoxic respiratory failure, past medical history of smoking, chronic pain syndrome, sleep apnea, OSA, on BiPAP overnight. Resents with unresponsive status requiring intubation and was admitted to the ICU. Currently being transferred to hospitalist service after extubation.  Currently plan is further work-up for chest pain as well as supportive care for elevated hypertension and respiratory distress.  Assessment and Plan: 1. Acute on chronic hypoxic respiratory failure In the setting of acute COPD exacerbation as well as worsening of OSA. Also acute on chronic diastolic CHF. Currently diuresed. Extubated. Has SVT is likely might have placed the patient in heart failure. Cardiology consulted. Appreciate input. Unable to provide BiPAP given persistent nausea, but no vomiting.  2. Accelerated hypertension Blood pressure regimen adjusted. We will monitor response. As needed hydralazine and labetalol.  3. Demand ischemia Significant EKG changes. Poorly controlled blood pressure as well. Patient continues to have some chest pain. Cardiology consulted. Potentially cath tomorrow as long as respiratory distress under control.  4. Chronic kidney disease Renal function currently stable. Mild hyponatremia as well. We will monitor response to diuresis. May require further work-up with renal function worsens.  5. Obesity, morbid Placing the patient at poor outcome. Body mass index is 44.8 kg/m.    6. Epigastric and abdominal pain. CT abdomen was performed without any contrast but does not show any evidence of acute abnormality. Patient continues to have some nausea. Etiology still not clear. We will continue to monitor for now.  Diet: Cardiac  diet, n.p.o. after midnight DVT Prophylaxis:   SCDs Start: 02/12/20 1100    Advance goals of care discussion: Full code  Family Communication: no family was present at bedside, at the time of interview.   Disposition:  Status is: Inpatient  Remains inpatient appropriate because:Hemodynamically unstable   Dispo: The patient is from: Home              Anticipated d/c is to: SNF              Anticipated d/c date is: > 3 days              Patient currently is not medically stable to d/c.        Subjective: Reports nausea. Reports epigastric pain. No fever no chills. No vomiting today. Had a BM last night.  Physical Exam:  General: Appear in marked distress, no Rash; Oral Mucosa Clear, moist. no Abnormal Neck Mass Or lumps, Conjunctiva normal  Cardiovascular: S1 and S2 Present, no Murmur, Respiratory: increased respiratory effort, Bilateral Air entry present and bilateral  Crackles, no wheezes Abdomen: Bowel Sound present, Soft and epigastric tenderness Extremities: bilateral  Pedal edema Neurology: alert and oriented to time, place, and person affect anxious. no new focal deficit Gait not checked due to patient safety concerns  Vitals:   02/15/20 1500 02/15/20 1600 02/15/20 1700 02/15/20 1800  BP: (!) 156/73 (!) 160/73 (!) 177/77 (!) 187/75  Pulse: 75 69 71 67  Resp: 14 14 20 15   Temp: 98.8 F (37.1 C) 99 F (37.2 C) 99.3 F (37.4 C) 99.1 F (37.3 C)  TempSrc:      SpO2: 97% 97% 96% 97%  Weight:      Height:        Intake/Output Summary (Last  24 hours) at 02/15/2020 1922 Last data filed at 02/15/2020 1552 Gross per 24 hour  Intake 65.81 ml  Output 3250 ml  Net -3184.19 ml   Filed Weights   02/12/20 0907 02/14/20 0424  Weight: 125.2 kg 125.9 kg    Data Reviewed: I have personally reviewed and interpreted daily labs, tele strips, imagings as discussed above. I reviewed all nursing notes, pharmacy notes, vitals, pertinent old records I have discussed plan  of care as described above with RN and patient/family.  CBC: Recent Labs  Lab 02/12/20 0916 02/13/20 0541 02/14/20 0512 02/15/20 0458  WBC 7.9 8.9 11.7* 7.9  NEUTROABS 3.5  --  9.9* 4.8  HGB 12.6 13.1 13.9 12.8  HCT 41.6 41.6 44.9 42.2  MCV 93.5 89.8 89.3 93.2  PLT 340 308 397 319   Basic Metabolic Panel: Recent Labs  Lab 02/12/20 0916 02/13/20 0541 02/14/20 0512 02/15/20 0458  NA 139 144 148* 148*  K 4.0 3.9 3.6 3.7  CL 102 106 104 104  CO2 24 25 29  34*  GLUCOSE 275* 124* 144* 108*  BUN 20 17 21  28*  CREATININE 1.38* 1.13* 1.34* 1.34*  CALCIUM 8.8* 9.2 9.4 8.9  MG 1.9  --  1.8 2.2  PHOS  --   --  2.9 3.5    Studies: No results found.  Scheduled Meds:  amLODipine  5 mg Oral Daily   aspirin EC  81 mg Oral Daily   atorvastatin  40 mg Oral Daily   carvedilol  25 mg Oral BID WC   Chlorhexidine Gluconate Cloth  6 each Topical Daily   cloNIDine  0.3 mg Transdermal Weekly   feeding supplement  237 mL Oral BID BM   insulin aspart  0-15 Units Subcutaneous Q4H   isosorbide mononitrate  30 mg Oral Once   [START ON 02/16/2020] isosorbide mononitrate  60 mg Oral Daily   [START ON 02/16/2020] levothyroxine  50 mcg Oral Q0600   mouth rinse  15 mL Mouth Rinse BID   methylPREDNISolone (SOLU-MEDROL) injection  20 mg Intravenous Q24H   nitroGLYCERIN  1.5 inch Topical Q6H   oxyCODONE  10 mg Oral Q12H   pantoprazole  40 mg Oral BID AC   polyethylene glycol  17 g Per Tube Daily   senna-docusate  1 tablet Oral BID   simethicone  80 mg Oral QID   sodium chloride flush  3 mL Intravenous Q12H   Continuous Infusions:  sodium chloride     heparin 1,150 Units/hr (02/15/20 1552)   PRN Meds: sodium chloride, acetaminophen (TYLENOL) oral liquid 160 mg/5 mL, calcium carbonate, cyclobenzaprine, hydrALAZINE, labetalol, morphine injection, nitroGLYCERIN, ondansetron (ZOFRAN) IV, sodium chloride flush, traMADol  Time spent: 35 minutes  Author: 02/18/2020,  MD [facility] 02/15/2020 7:22 PM  To reach On-call, see care teams to locate the attending and reach out via [email or web address]. Between 7PM-7AM, please contact night-coverage If you still have difficulty reaching the attending provider, please page the [facility] (Director on Call) for [facility] on amion for assistance.

## 2020-02-16 ENCOUNTER — Encounter: Payer: Self-pay | Admitting: Internal Medicine

## 2020-02-16 ENCOUNTER — Encounter
Admission: EM | Disposition: A | Discharge status: Other Acute Inpatient Hospital | Payer: Self-pay | Source: Home / Self Care | Attending: Internal Medicine

## 2020-02-16 ENCOUNTER — Ambulatory Visit (HOSPITAL_COMMUNITY)
Admission: AD | Admit: 2020-02-16 | Discharge: 2020-02-16 | Disposition: A | Discharge status: Home / Self Care | Payer: Medicare Other | Source: Other Acute Inpatient Hospital | Attending: Internal Medicine | Admitting: Internal Medicine

## 2020-02-16 DIAGNOSIS — J9620 Acute and chronic respiratory failure, unspecified whether with hypoxia or hypercapnia: Secondary | ICD-10-CM | POA: Diagnosis not present

## 2020-02-16 DIAGNOSIS — I2510 Atherosclerotic heart disease of native coronary artery without angina pectoris: Secondary | ICD-10-CM

## 2020-02-16 DIAGNOSIS — I5031 Acute diastolic (congestive) heart failure: Secondary | ICD-10-CM

## 2020-02-16 DIAGNOSIS — I214 Non-ST elevation (NSTEMI) myocardial infarction: Secondary | ICD-10-CM | POA: Diagnosis not present

## 2020-02-16 DIAGNOSIS — J9690 Respiratory failure, unspecified, unspecified whether with hypoxia or hypercapnia: Secondary | ICD-10-CM | POA: Insufficient documentation

## 2020-02-16 HISTORY — PX: RIGHT/LEFT HEART CATH AND CORONARY ANGIOGRAPHY: CATH118266

## 2020-02-16 LAB — THYROID PANEL WITH TSH
Free Thyroxine Index: 3.3 (ref 1.2–4.9)
T3 Uptake Ratio: 29 % (ref 24–39)
T4, Total: 11.5 ug/dL (ref 4.5–12.0)
TSH: 0.645 u[IU]/mL (ref 0.450–4.500)

## 2020-02-16 LAB — CBC WITH DIFFERENTIAL/PLATELET
Abs Immature Granulocytes: 0.05 10*3/uL (ref 0.00–0.07)
Basophils Absolute: 0 10*3/uL (ref 0.0–0.1)
Basophils Relative: 0 %
Eosinophils Absolute: 0 10*3/uL (ref 0.0–0.5)
Eosinophils Relative: 0 %
HCT: 43 % (ref 36.0–46.0)
Hemoglobin: 13 g/dL (ref 12.0–15.0)
Immature Granulocytes: 1 %
Lymphocytes Relative: 26 %
Lymphs Abs: 2.2 10*3/uL (ref 0.7–4.0)
MCH: 27.9 pg (ref 26.0–34.0)
MCHC: 30.2 g/dL (ref 30.0–36.0)
MCV: 92.3 fL (ref 80.0–100.0)
Monocytes Absolute: 1.2 10*3/uL — ABNORMAL HIGH (ref 0.1–1.0)
Monocytes Relative: 14 %
Neutro Abs: 5 10*3/uL (ref 1.7–7.7)
Neutrophils Relative %: 59 %
Platelets: 310 10*3/uL (ref 150–400)
RBC: 4.66 MIL/uL (ref 3.87–5.11)
RDW: 16.3 % — ABNORMAL HIGH (ref 11.5–15.5)
WBC: 8.5 10*3/uL (ref 4.0–10.5)
nRBC: 0 % (ref 0.0–0.2)

## 2020-02-16 LAB — GLUCOSE, CAPILLARY
Glucose-Capillary: 105 mg/dL — ABNORMAL HIGH (ref 70–99)
Glucose-Capillary: 105 mg/dL — ABNORMAL HIGH (ref 70–99)
Glucose-Capillary: 107 mg/dL — ABNORMAL HIGH (ref 70–99)
Glucose-Capillary: 110 mg/dL — ABNORMAL HIGH (ref 70–99)
Glucose-Capillary: 111 mg/dL — ABNORMAL HIGH (ref 70–99)
Glucose-Capillary: 177 mg/dL — ABNORMAL HIGH (ref 70–99)
Glucose-Capillary: 93 mg/dL (ref 70–99)

## 2020-02-16 LAB — TRIGLYCERIDES: Triglycerides: 121 mg/dL (ref <150)

## 2020-02-16 LAB — BASIC METABOLIC PANEL
Anion gap: 12 (ref 5–15)
BUN: 33 mg/dL — ABNORMAL HIGH (ref 8–23)
CO2: 33 mmol/L — ABNORMAL HIGH (ref 22–32)
Calcium: 9.1 mg/dL (ref 8.9–10.3)
Chloride: 102 mmol/L (ref 98–111)
Creatinine, Ser: 1.23 mg/dL — ABNORMAL HIGH (ref 0.44–1.00)
GFR, Estimated: 49 mL/min — ABNORMAL LOW (ref >60)
Glucose, Bld: 115 mg/dL — ABNORMAL HIGH (ref 70–99)
Potassium: 3.7 mmol/L (ref 3.5–5.1)
Sodium: 147 mmol/L — ABNORMAL HIGH (ref 135–145)

## 2020-02-16 LAB — HEPARIN LEVEL (UNFRACTIONATED)
Heparin Unfractionated: 0.59 IU/mL (ref 0.30–0.70)
Heparin Unfractionated: 0.51 IU/mL (ref 0.30–0.70)
Heparin Unfractionated: 0.33 IU/mL (ref 0.30–0.70)

## 2020-02-16 LAB — MAGNESIUM: Magnesium: 2.2 mg/dL (ref 1.7–2.4)

## 2020-02-16 LAB — PHOSPHORUS: Phosphorus: 3.1 mg/dL (ref 2.5–4.6)

## 2020-02-16 SURGERY — RIGHT/LEFT HEART CATH AND CORONARY ANGIOGRAPHY
Anesthesia: Moderate Sedation

## 2020-02-16 MED ORDER — IOHEXOL 300 MG/ML  SOLN
INTRAMUSCULAR | Status: DC | PRN
  Administered 2020-02-16: 42 mL

## 2020-02-16 MED ORDER — SODIUM CHLORIDE 0.9% FLUSH: 3.0000 mL | INTRAVENOUS | Status: DC | PRN

## 2020-02-16 MED ORDER — SODIUM CHLORIDE 0.9% FLUSH
3.0000 mL | Freq: Two times a day (BID) | INTRAVENOUS | Status: DC
  Administered 2020-02-16: 3 mL via INTRAVENOUS

## 2020-02-16 MED ORDER — FENTANYL CITRATE (PF) 100 MCG/2ML IJ SOLN
INTRAMUSCULAR | Status: AC
  Filled 2020-02-16: qty 2

## 2020-02-16 MED ORDER — FUROSEMIDE 10 MG/ML IJ SOLN
INTRAMUSCULAR | Status: AC
  Filled 2020-02-16: qty 4

## 2020-02-16 MED ORDER — SODIUM CHLORIDE 0.9 % IV SOLN: 250.0000 mL | INTRAVENOUS | Status: DC | PRN

## 2020-02-16 MED ORDER — FUROSEMIDE 10 MG/ML IJ SOLN: 40.0000 mg | Freq: Two times a day (BID) | INTRAMUSCULAR | 0 refills | Status: DC

## 2020-02-16 MED ORDER — HEPARIN (PORCINE) IN NACL 1000-0.9 UT/500ML-% IV SOLN
INTRAVENOUS | Status: AC
  Filled 2020-02-16: qty 1000

## 2020-02-16 MED ORDER — HEPARIN SODIUM (PORCINE) 1000 UNIT/ML IJ SOLN
INTRAMUSCULAR | Status: DC | PRN
  Administered 2020-02-16: 5000 [IU] via INTRAVENOUS

## 2020-02-16 MED ORDER — HYDRALAZINE HCL 20 MG/ML IJ SOLN
10.0000 mg | INTRAMUSCULAR | Status: DC | PRN
  Administered 2020-02-16: 10 mg via INTRAVENOUS
  Filled 2020-02-16: qty 1

## 2020-02-16 MED ORDER — ISOSORBIDE MONONITRATE ER 60 MG PO TB24
60.0000 mg | ORAL_TABLET | Freq: Every day | ORAL | Status: DC
Start: 2020-02-16 — End: 2020-03-05

## 2020-02-16 MED ORDER — FENTANYL CITRATE (PF) 100 MCG/2ML IJ SOLN
INTRAMUSCULAR | Status: DC | PRN
  Administered 2020-02-16: 25 ug via INTRAVENOUS

## 2020-02-16 MED ORDER — ATORVASTATIN CALCIUM 40 MG PO TABS
40.0000 mg | ORAL_TABLET | Freq: Every day | ORAL | 0 refills | Status: DC
Start: 2020-02-16 — End: 2020-03-05

## 2020-02-16 MED ORDER — HEPARIN (PORCINE) IN NACL 1000-0.9 UT/500ML-% IV SOLN
INTRAVENOUS | Status: DC | PRN
  Administered 2020-02-16 (×2): 500 mL

## 2020-02-16 MED ORDER — HEPARIN (PORCINE) 25000 UT/250ML-% IV SOLN: 1150.0000 [IU]/h | INTRAVENOUS | Status: DC

## 2020-02-16 MED ORDER — HEPARIN SODIUM (PORCINE) 1000 UNIT/ML IJ SOLN
INTRAMUSCULAR | Status: AC
  Filled 2020-02-16: qty 1

## 2020-02-16 MED ORDER — VERAPAMIL HCL 2.5 MG/ML IV SOLN
INTRAVENOUS | Status: AC
  Filled 2020-02-16: qty 2

## 2020-02-16 MED ORDER — HEPARIN (PORCINE) 25000 UT/250ML-% IV SOLN
1150.0000 [IU]/h | INTRAVENOUS | Status: DC
  Administered 2020-02-16: 1150 [IU]/h via INTRAVENOUS
  Filled 2020-02-16: qty 250

## 2020-02-16 MED ORDER — POTASSIUM CHLORIDE CRYS ER 20 MEQ PO TBCR
40.0000 meq | EXTENDED_RELEASE_TABLET | Freq: Once | ORAL | Status: AC
  Administered 2020-02-16: 40 meq via ORAL
  Filled 2020-02-16: qty 2

## 2020-02-16 MED ORDER — FUROSEMIDE 10 MG/ML IJ SOLN
40.0000 mg | Freq: Two times a day (BID) | INTRAMUSCULAR | Status: DC
  Administered 2020-02-16 (×2): 40 mg via INTRAVENOUS
  Filled 2020-02-16: qty 4

## 2020-02-16 MED ORDER — LABETALOL HCL 5 MG/ML IV SOLN
5.0000 mg | INTRAVENOUS | Status: DC | PRN
Start: 2020-02-16 — End: 2020-03-05

## 2020-02-16 MED ORDER — LABETALOL HCL 5 MG/ML IV SOLN: 10.0000 mg | INTRAVENOUS | Status: AC | PRN

## 2020-02-16 MED ORDER — AMLODIPINE BESYLATE 10 MG PO TABS: 10.0000 mg | ORAL_TABLET | Freq: Every day | ORAL | Status: DC

## 2020-02-16 MED ORDER — VERAPAMIL HCL 2.5 MG/ML IV SOLN
INTRAVENOUS | Status: DC | PRN
  Administered 2020-02-16: 2.5 mg via INTRA_ARTERIAL

## 2020-02-16 MED ORDER — SENNOSIDES-DOCUSATE SODIUM 8.6-50 MG PO TABS
1.0000 | ORAL_TABLET | Freq: Two times a day (BID) | ORAL | Status: DC
Start: 2020-02-16 — End: 2020-03-05

## 2020-02-16 MED ORDER — CLONIDINE HCL 0.1 MG PO TABS
0.3000 mg | ORAL_TABLET | Freq: Three times a day (TID) | ORAL | Status: DC
  Administered 2020-02-16 (×2): 0.3 mg via ORAL
  Filled 2020-02-16 (×2): qty 3

## 2020-02-16 MED ORDER — POLYETHYLENE GLYCOL 3350 17 G PO PACK: 17.0000 g | PACK | Freq: Every day | ORAL | 0 refills | Status: AC

## 2020-02-16 MED ORDER — SODIUM CHLORIDE 0.9% FLUSH: 3.0000 mL | Freq: Two times a day (BID) | INTRAVENOUS | Status: DC

## 2020-02-16 MED ORDER — SIMETHICONE 80 MG PO CHEW
80.0000 mg | CHEWABLE_TABLET | Freq: Four times a day (QID) | ORAL | 0 refills | Status: DC
Start: 2020-02-16 — End: 2020-03-05

## 2020-02-16 MED ORDER — HYDRALAZINE HCL 20 MG/ML IJ SOLN: 10.0000 mg | INTRAMUSCULAR | Status: AC | PRN

## 2020-02-16 MED ORDER — SODIUM CHLORIDE 0.9 % IV SOLN
INTRAVENOUS | Status: DC
  Administered 2020-02-16: 1000 mL via INTRAVENOUS

## 2020-02-16 SURGICAL SUPPLY — 11 items
CATH 5F 110X4 TIG (CATHETERS) ×2 IMPLANT
CATH BALLN WEDGE 5F 110CM (CATHETERS) ×2 IMPLANT
CATH INFINITI 5FR ANG PIGTAIL (CATHETERS) ×2 IMPLANT
DEVICE RAD COMP TR BAND LRG (VASCULAR PRODUCTS) ×2 IMPLANT
GLIDESHEATH SLEND SS 6F .021 (SHEATH) ×2 IMPLANT
GUIDEWIRE INQWIRE 1.5J.035X260 (WIRE) IMPLANT
INQWIRE 1.5J .035X260CM (WIRE) ×3
PACK CARDIAC CATH (CUSTOM PROCEDURE TRAY) ×3 IMPLANT
SET ATX SIMPLICITY (MISCELLANEOUS) ×2 IMPLANT
SHEATH GLIDE SLENDER 4/5FR (SHEATH) ×2 IMPLANT
WIRE ROSEN-J .035X260CM (WIRE) ×2 IMPLANT

## 2020-02-16 NOTE — Progress Notes (Signed)
ANTICOAGULATION CONSULT NOTE  Pharmacy Consult for heparin Indication: chest pain/ACS  No Known Allergies  Patient Measurements: Height: 5\' 6"  (167.6 cm) Weight: 125 kg (275 lb 9.2 oz) IBW/kg (Calculated) : 59.3 Heparin Dosing Weight: 92 kg  Vital Signs: Temp Source: Bladder (10/21 2000)  Labs: Recent Labs    02/14/20 0512 02/14/20 0512 02/14/20 0748 02/15/20 0458 02/15/20 0914 02/15/20 1148 02/15/20 1717 02/15/20 2309 02/16/20 0443  HGB 13.9   < >  --  12.8  --   --   --   --  13.0  HCT 44.9  --   --  42.2  --   --   --   --  43.0  PLT 397  --   --  319  --   --   --   --  310  HEPARINUNFRC  --   --   --   --   --   --  0.51 0.59 0.51  CREATININE 1.34*  --   --  1.34*  --   --   --   --  1.23*  TROPONINIHS 95*   < > 125*  --  97* 96*  --   --   --    < > = values in this interval not displayed.    Estimated Creatinine Clearance: 63.3 mL/min (A) (by C-G formula based on SCr of 1.23 mg/dL (H)).   Medical History: Past Medical History:  Diagnosis Date  . Anemia   . AVM (arteriovenous malformation)   . Carotid artery disease (HCC) 10/31/2015  . Chronic respiratory failure (HCC)   . COPD (chronic obstructive pulmonary disease) (HCC)   . Critical ischemia of lower extremity (HCC) 07/18/2014  . H/O blood clots   . Hyperkalemia   . Hypertension   . Hypothyroidism 09/05/2014  . Morbid obesity (HCC)   . PVD (peripheral vascular disease) (HCC)   . Renal failure   . Stroke [facility])    may 2015  . Thyroid disease     Assessment: 64 year old female admitted with PMH COPD, hx smoking 40 years, and hypoxic respiratory failure requiring intubation. Patient later extubated, c/o severe epigastric and abdominal pain. 10/18 CTA chest with no PE. 10/20 CT abd/pel negative for acute findings. EKG with profound ST depression. Cardiology consulted. Plan for cath 10/22. Pharmacy consulted for heparin dosing and monitoring.  CBC stable  10/21 @1717  HL 0.51, therapeutic x1   Goal  of Therapy:  Heparin level 0.3-0.7 units/ml Monitor platelets by anticoagulation protocol: Yes   Plan:  HL therapeutic - will continue current rate at 1150 units/hr Recheck HL in 6 hours to confirm Monitor CBC daily while on heparin drip.  10/21:  HL @ 2309 = 0.59 Will continue pt on current rate and recheck HL on 10/22 with AM labs.   10/22: HL @ 0443 = 0.51 Will continue pt on current rate and recheck HL on 10/23 with AM labs.   Kasandra Fehr D, PharmD 02/16/2020 6:58 AM

## 2020-02-16 NOTE — Progress Notes (Signed)
[facility] here to pick up patient. Thorough report given to attendants. This RN also called [facility] and gave a thorough report to [employer] [facility]. All questions answered. Patient alert and oriented x4 and verbalized understanding of transport. Patient with all personal belongings taken with her. Patient requested a call to her sister for an update. Claris Gower (sister) updated.

## 2020-02-16 NOTE — Progress Notes (Signed)
ANTICOAGULATION CONSULT NOTE  Pharmacy Consult for heparin Indication: chest pain/ACS  No Known Allergies  Patient Measurements: Height: 5\' 6"  (167.6 cm) Weight: 125 kg (275 lb 9.2 oz) IBW/kg (Calculated) : 59.3 Heparin Dosing Weight: 92 kg  Vital Signs: Temp: 97.9 F (36.6 C) (10/22 0950) Temp Source: Oral (10/22 0950) BP: 174/83 (10/22 1300) Pulse Rate: 71 (10/22 1300)  Labs: Recent Labs    02/14/20 0512 02/14/20 0512 02/14/20 0748 02/15/20 0458 02/15/20 0914 02/15/20 1148 02/15/20 1717 02/15/20 2309 02/16/20 0443  HGB 13.9   < >  --  12.8  --   --   --   --  13.0  HCT 44.9  --   --  42.2  --   --   --   --  43.0  PLT 397  --   --  319  --   --   --   --  310  HEPARINUNFRC  --   --   --   --   --   --  0.51 0.59 0.51  CREATININE 1.34*  --   --  1.34*  --   --   --   --  1.23*  TROPONINIHS 95*   < > 125*  --  97* 96*  --   --   --    < > = values in this interval not displayed.    Estimated Creatinine Clearance: 63.3 mL/min (A) (by C-G formula based on SCr of 1.23 mg/dL (H)).   Medical History: Past Medical History:  Diagnosis Date  . Anemia   . AVM (arteriovenous malformation)   . Carotid artery disease (HCC) 10/31/2015  . Chronic respiratory failure (HCC)   . COPD (chronic obstructive pulmonary disease) (HCC)   . Critical ischemia of lower extremity (HCC) 07/18/2014  . H/O blood clots   . Hyperkalemia   . Hypertension   . Hypothyroidism 09/05/2014  . Morbid obesity (HCC)   . PVD (peripheral vascular disease) (HCC)   . Renal failure   . Stroke [facility])    may 2015  . Thyroid disease     Assessment: 64 year old female admitted with PMH COPD, hx smoking 40 years, and hypoxic respiratory failure requiring intubation. Patient later extubated, c/o severe epigastric and abdominal pain. 10/18 CTA chest with no PE. 10/20 CT abd/pel negative for acute findings. EKG with profound ST depression. Cardiology consulted. Patient s/p cath 10/22. Pharmacy consulted for  heparin dosing.   Goal of Therapy:  Heparin level 0.3-0.7 units/ml Monitor platelets by anticoagulation protocol: Yes   Plan:  Patient s/p cath. Consult to restart heparin drip 2 hours post TR band removal. Spoke with RN. TR band to be removed ~ 1340. Will restart heparin drip at previously therapeutic rate of 1150 units/hr at 1600. Check HL at 2200 to confirm then will go back to daily monitoring.  11/22, PharmD 02/16/2020 1:24 PM

## 2020-02-16 NOTE — Progress Notes (Signed)
TR band removed at this time

## 2020-02-16 NOTE — Progress Notes (Signed)
PT Cancellation Note  Patient Details Name: Kimberly Mccarthy MRN: [id number] DOB: November 30, 1955   Cancelled Treatment:    Reason Eval/Treat Not Completed: Patient not medically ready.  PT consult received.  Chart reviewed.  Pt currently off floor (s/p cardiac cath today) and with bedrest order.  Will re-attempt PT evaluation at a later date/time as medically appropriate.  Hendricks Limes, PT 02/16/20, 12:31 PM

## 2020-02-16 NOTE — Progress Notes (Signed)
OT Cancellation Note  Patient Details Name: Kimberly Mccarthy MRN: [id number] DOB: June 06, 1955   Cancelled Treatment:    Reason Eval/Treat Not Completed: Patient at procedure or test/ unavailable. OT order received and chart reviewed. Pt currently off floor for [facility]. Will follow acutely and initiate services at later date/time as able and appropriate.    Kathie Dike, M.S. OTR/L  02/16/20, 10:17 AM  ascom [phone]

## 2020-02-16 NOTE — Brief Op Note (Signed)
BRIEF CARDIAC CATHETERIZATION NOTE  DATE: 02/16/2020  TIME: 11:29 AM  PATIENT:  Kimberly Mccarthy  64 y.o. female  PRE-OPERATIVE DIAGNOSIS:  NSTEMI  POST-OPERATIVE DIAGNOSIS:  NSTEMI and acute HFpEF  PROCEDURE:  Procedure(s): RIGHT/LEFT HEART CATH AND CORONARY ANGIOGRAPHY (N/A)  SURGEON:  Surgeon(s) and Role:    * Marilynn Ekstein, Cristal Deer, MD - Primary  FINDINGS: 1. Severe two vessel coronary artery disease with diffuse, heavy calcification.  There are sequential 90% proximal, 50% mid, and 60% distal LAD stenoses, as well as chronic total occlusion at the ostium of a codominant RCA.  LCx has moderate diffuse disease. 2. Severely elevated left heart, right heart, and pulmonary artery pressures. 3. Normal Fick cardiac output/index.  RECOMMENDATIONS: 1. Aggressive diuresis for management of acute HFpEF. 2. Transfer to tertiary care center for CABG versus PCI to LAD with atherectomy. 3. Secondary prevention of coronary artery disease.  Yvonne Kendall, MD [facility]

## 2020-02-16 NOTE — Progress Notes (Signed)
ANTICOAGULATION CONSULT NOTE  Pharmacy Consult for heparin Indication: chest pain/ACS  No Known Allergies  Patient Measurements: Height: 5\' 6"  (167.6 cm) Weight: 125 kg (275 lb 9.2 oz) IBW/kg (Calculated) : 59.3 Heparin Dosing Weight: 92 kg  Vital Signs: Temp: 98.6 F (37 C) (10/22 2242) Temp Source: Oral (10/22 2242) BP: 170/64 (10/22 2242) Pulse Rate: 85 (10/22 2242)  Labs: Recent Labs    02/14/20 0512 02/14/20 0512 02/14/20 0748 02/15/20 0458 02/15/20 0914 02/15/20 1148 02/15/20 1717 02/15/20 2309 02/16/20 0443 02/16/20 2222  HGB 13.9   < >  --  12.8  --   --   --   --  13.0  --   HCT 44.9  --   --  42.2  --   --   --   --  43.0  --   PLT 397  --   --  319  --   --   --   --  310  --   HEPARINUNFRC  --   --   --   --   --   --    < > 0.59 0.51 0.33  CREATININE 1.34*  --   --  1.34*  --   --   --   --  1.23*  --   TROPONINIHS 95*   < > 125*  --  97* 96*  --   --   --   --    < > = values in this interval not displayed.    Estimated Creatinine Clearance: 63.3 mL/min (A) (by C-G formula based on SCr of 1.23 mg/dL (H)).   Medical History: Past Medical History:  Diagnosis Date  . Anemia   . AVM (arteriovenous malformation)   . Carotid artery disease (HCC) 10/31/2015  . Chronic respiratory failure (HCC)   . COPD (chronic obstructive pulmonary disease) (HCC)   . Critical ischemia of lower extremity (HCC) 07/18/2014  . H/O blood clots   . Hyperkalemia   . Hypertension   . Hypothyroidism 09/05/2014  . Morbid obesity (HCC)   . PVD (peripheral vascular disease) (HCC)   . Renal failure   . Stroke [facility])    may 2015  . Thyroid disease     Assessment: 64 year old female admitted with PMH COPD, hx smoking 40 years, and hypoxic respiratory failure requiring intubation. Patient later extubated, c/o severe epigastric and abdominal pain. 10/18 CTA chest with no PE. 10/20 CT abd/pel negative for acute findings. EKG with profound ST depression. Cardiology consulted.  Patient s/p cath 10/22. Pharmacy consulted for heparin dosing.   Goal of Therapy:  Heparin level 0.3-0.7 units/ml Monitor platelets by anticoagulation protocol: Yes   Plan:  Patient s/p cath. Consult to restart heparin drip 2 hours post TR band removal. Spoke with RN. TR band to be removed ~ 1340. Will restart heparin drip at previously therapeutic rate of 1150 units/hr at 1600. Check HL at 2200 to confirm then will go back to daily monitoring.  10/22:  HL @ 2222 = 0.33 Will continue pt on current rate and recheck HL on 10/23 with AM labs.   Chanese Hartsough D, PharmD 02/16/2020 11:00 PM

## 2020-02-16 NOTE — Discharge Summary (Signed)
[facility] Discharge Summary   Patient: Kimberly MeierLeatha E Mccarthy ZOX:[id number]  PCP: [facility]  Date of admission: 02/12/2020   Date of discharge:  02/16/2020     Discharge Diagnoses:  Principal diagnosis Acute hypoxic respiratory failure.  Active Problems:   Acute on chronic respiratory failure (HCC)   COPD exacerbation (HCC)   Renal failure   Elevated brain natriuretic peptide (BNP) level   Non-ST elevation (NSTEMI) myocardial infarction [facility](HCC)  Admitted From: home [facility]  [facility]  Recommendations for Outpatient Follow-up:  1. PCP: follow up in 1 week after discharge  Diet recommendation: Cardiac diet  Activity: The patient is advised to gradually reintroduce usual activities, as tolerated  Discharge Condition: stable  Code Status: Full code   History of present illness: As per the H and P dictated on admission, "64 y.o.femalewith a past medical history of HTN, HPL, chronic pain on oxycodone, hypothyroidism, CKD,PVD,CVA,  +chronic hypoxic respiratory failure2/COPD on BiPAP at night at home who presents via EMS after they were called out for respiratory distress.   Per EMS patient had agonal respirations on arrival and was unable provide any history.  They provided bag-valve-mask ventilation in route.    Shortly after patient arrival sister at bedside who was able to state that the patient does not wear oxygen normally during the day only BiPAP at night and that this morning patient was complaining of severe shortness of breath and seem to be getting more more short of breath until EMS was called. Patient is otherwise not been complaining of any other acute sick symptoms per sister.  Patient with progressive resp failure and distress Patient was intubated emergently  CTA did NOT reveal PE but shows interstitial edema, and what seem to be loculated effusions on RT side"  Hospital Course:  Summary of her active  problems in the hospital is as following. 1. Acute on chronic hypoxic respiratory failure In the setting of acute COPD exacerbation as well as worsening of OSA. Acute on chronic diastolic CHF. No systolic CHF Intubated on admission  Extubated 10/19 On steroids solumedrol 20 mg bid  Currently diuresed. Cardiology consulted. Appreciate input. Bipap QHS per home regimen   2. Accelerated hypertension Blood pressure regimen adjusted We will monitor response. As needed hydralazine and labetalol.  3. Demand ischemia Significant EKG changes. Poorly controlled blood pressure as well. Patient continues to have some chest pain. Cardiology consulted. S/p right and left heart Cardiac Catheterization  Conclusions: 1. Severe two vessel coronary artery disease with diffuse, heavy calcification. There are sequential 90% proximal, 50% mid, and 60% distal LAD stenoses, as well as chronic total occlusion at the ostium of a codominant RCA. LCx has moderate diffuse disease. 2. Severely elevated left heart, right heart, and pulmonary artery pressures. 3. Normal Fick cardiac output/index.  Recommendations: 1. Aggressive diuresis and improved blood pressure control for management of acute HFpEF. 2. Transfer to tertiary care center for CABG versus PCI to LAD with atherectomy. 3. Secondary prevention of coronary artery disease.  4. Chronic kidney disease Renal function currently stable. Mild hyponatremia as well. We will monitor response to diuresis. May require further work-up with renal function worsens.  5. Obesity, morbid Placing the patient at poor outcome. Body mass index is 44.8 kg/m.   6. Epigastric and abdominal pain. Intractable nausea- resolved  CT abdomen was performed without any contrast but does not show any evidence of acute abnormality. Patient continues to have some nausea. Etiology still not clear. We will  continue to monitor for now.  7. Chronic pain syndrome  Pain  control  Continue home regimen of oxycontin  On the day of the discharge the patient's vitals were stable, and no other acute medical condition were reported by patient. The patient was felt safe to be discharge at [facility].  Consultants: Cardiology  Primary admission with PCCM Procedures: Echocardiogram  Intubation  Cardiac Catheterization   Discharge Exam: General: Appear in no distress, no Rash; Oral Mucosa Clear, moist. no Abnormal Neck Mass Or lumps, Conjunctiva normal  Cardiovascular: S1 and S2 Present, no Murmur Respiratory: increased respiratory effort, Bilateral Air entry present and bilateral  Crackles, no wheezes Abdomen: Bowel Sound present, Soft and no tenderness Extremities: bilateral  Pedal edema Neurology: alert and oriented to time, place, and person affect appropriate. no new focal deficit  Filed Weights   02/12/20 0907 02/14/20 0424 02/16/20 0500  Weight: 125.2 kg 125.9 kg 125 kg   Vitals:   02/16/20 1300 02/16/20 1400  BP: (!) 174/83 (!) 176/93  Pulse: 71 69  Resp: 16 16  Temp:  98.4 F (36.9 C)  SpO2: 96% 96%    DISCHARGE MEDICATION: Allergies as of 02/16/2020   No Known Allergies     Medication List    STOP taking these medications   pravastatin 20 MG tablet Commonly known as: PRAVACHOL     TAKE these medications   Advair HFA 115-21 MCG/ACT inhaler Generic drug: fluticasone-salmeterol Inhale 2 puffs into the lungs 2 (two) times daily.   amLODipine 10 MG tablet Commonly known as: NORVASC Take 10 mg by mouth daily.   aspirin EC 81 MG tablet Take 81 mg by mouth daily.   atorvastatin 40 MG tablet Commonly known as: LIPITOR Take 1 tablet (40 mg total) by mouth daily.   carvedilol 25 MG tablet Commonly known as: COREG Take 25 mg by mouth 2 (two) times daily with a meal.   cloNIDine 0.3 MG tablet Commonly known as: CATAPRES Take 0.3 mg by mouth 2 (two) times daily.   cyclobenzaprine 10 MG tablet Commonly known as:  FLEXERIL Take 10 mg by mouth at bedtime as needed for muscle spasms.   docusate sodium 100 MG capsule Commonly known as: COLACE Take 100 mg by mouth 2 (two) times daily.   feeding supplement Liqd Take 237 mLs by mouth 2 (two) times daily between meals.   ferrous sulfate 325 (65 FE) MG tablet Take 325 mg by mouth 3 (three) times daily with meals.   furosemide 10 MG/ML injection Commonly known as: LASIX Inject 4 mLs (40 mg total) into the vein 2 (two) times daily.   gabapentin 300 MG capsule Commonly known as: NEURONTIN Take 300 mg by mouth See admin instructions. Take 1 capsule ( ) by mouth every morning, 1 capsule ( ) every afternoon and take 2 capsules ( ) by mouth every night at bedtime   heparin 25000-0.45 UT/250ML-% infusion Inject 1,150 Units/hr into the vein continuous.   hydrALAZINE 50 MG tablet Commonly known as: APRESOLINE Take 50 mg by mouth 3 (three) times daily.   isosorbide mononitrate 60 MG 24 hr tablet Commonly known as: IMDUR Take 1 tablet (60 mg total) by mouth daily.   ketoconazole 2 % cream Commonly known as: NIZORAL Apply 1 application topically as directed.   labetalol 5 MG/ML injection Commonly known as: NORMODYNE Inject 1-2 mLs (5-10 mg total) into the vein every 2 (two) hours as needed (SBP>160 or DBP>100, hold if HR<70).   levothyroxine 50 MCG tablet Commonly known  as: SYNTHROID Take 50 mcg by mouth daily.   oxyCODONE 10 mg 12 hr tablet Commonly known as: OXYCONTIN Take 10 mg by mouth every 12 (twelve) hours.   pantoprazole 40 MG tablet Commonly known as: PROTONIX Take 40 mg by mouth 2 (two) times daily.   polyethylene glycol 17 g packet Commonly known as: MIRALAX / GLYCOLAX Take 17 g by mouth daily. Start taking on: February 17, 2020   ProAir HFA 108 (90 Base) MCG/ACT inhaler Generic drug: albuterol Inhale 1-2 puffs into the lungs every 6 (six) hours as needed for wheezing or shortness of breath.   albuterol (2.5  MG/3ML) 0.083% nebulizer solution Commonly known as: PROVENTIL Take 3 mLs (2.5 mg total) by nebulization every 6 (six) hours as needed for shortness of breath.   senna-docusate 8.6-50 MG tablet Commonly known as: Senokot-S Take 1 tablet by mouth 2 (two) times daily.   simethicone 80 MG chewable tablet Commonly known as: MYLICON Chew 1 tablet (80 mg total) by mouth 4 (four) times daily.   Vitamin D (Ergocalciferol) 1.25 MG (50000 UNIT) Caps capsule Commonly known as: DRISDOL Take 50,000 Units by mouth once a week.   zolpidem 5 MG tablet Commonly known as: AMBIEN Take 5 mg by mouth at bedtime as needed for sleep.      No Known Allergies Discharge Instructions    Diet - low sodium heart healthy   Complete by: As directed    Increase activity slowly   Complete by: As directed       The results of significant diagnostics from this hospitalization (including imaging, microbiology, ancillary and laboratory) are listed below for reference.    Significant Diagnostic Studies: CT ABDOMEN PELVIS WO CONTRAST  Result Date: 02/14/2020 CLINICAL DATA:  Acute nonlocalized abdominal pain with nausea and vomiting today. EXAM: CT ABDOMEN AND PELVIS WITHOUT CONTRAST TECHNIQUE: Multidetector CT imaging of the abdomen and pelvis was performed following the standard protocol without IV contrast. COMPARISON:  One view abdomen 02/12/2020. Chest CTA 02/12/2020. Abdominal CTA 05/09/2015. FINDINGS: Lower chest: Interval improved aeration of the lung bases compared with recent chest CTA. There is mild residual bibasilar atelectasis or scarring. No significant pleural or pericardial effusion. Atherosclerosis of the aorta and coronary arteries noted. Hepatobiliary: Mild contour irregularity of the liver which may reflect early cirrhosis. No focal hepatic abnormalities identified on noncontrast imaging. No evidence of gallstones, gallbladder wall thickening or biliary dilatation. Pancreas: Unremarkable. No  pancreatic ductal dilatation or surrounding inflammatory changes. Spleen: Normal in size without focal abnormality. Adrenals/Urinary Tract: Both adrenal glands appear normal. The kidneys appear stable with mild cortical lobularity. There is no hydronephrosis or urinary tract calculus. The bladder is decompressed by a Foley catheter. Stomach/Bowel: No evidence of bowel wall thickening, distention or surrounding inflammatory change. The appendix appears normal. There is moderate stool throughout the right colon. There are diverticular changes within the descending and sigmoid colon. Vascular/Lymphatic: There are no enlarged abdominal or pelvic lymph nodes. Diffuse aortic and branch vessel atherosclerosis. Postsurgical changes in both groins consistent with previous femoral bypass, grossly stable. Reproductive: Stable central calcified uterine fibroid. No adnexal mass. Other: No free air, significant ascites or focal extraluminal fluid collection. Trace pelvic ascites. Mild soft tissue emphysema within the subcutaneous fat of the anterior abdominal wall, likely related to subcutaneous injections. Musculoskeletal: No acute or significant osseous findings. Advanced facet disease noted the lower lumbar spine. IMPRESSION: 1. No acute findings or explanation for the patient's symptoms. 2. Distal colonic diverticulosis without evidence of acute inflammation.  3. Interval improved aeration of the lung bases compared with recent chest CTA. 4. Aortic Atherosclerosis (ICD10-I70.0). Electronically Signed   By: Carey Bullocks M.D.   On: 02/14/2020 13:14   DG Abdomen 1 View  Result Date: 02/12/2020 CLINICAL DATA:  Intubation and orogastric tube placement EXAM: ABDOMEN - 1 VIEW COMPARISON:  09/05/2014 FINDINGS: The enteric tube tip overlaps the stomach. Calcified uterine fibroid and atherosclerotic plaque. Bowel gas pattern is normal IMPRESSION: The enteric tube tip and side-port are at the stomach. Electronically Signed   By:  Marnee Spring M.D.   On: 02/12/2020 09:43   CT HEAD WO CONTRAST  Result Date: 02/12/2020 CLINICAL DATA:  Cerebral hemorrhage suspected. EXAM: CT HEAD WITHOUT CONTRAST TECHNIQUE: Contiguous axial images were obtained from the base of the skull through the vertex without intravenous contrast. COMPARISON:  None. FINDINGS: Brain: No evidence of acute large vascular territory infarction, hemorrhage, hydrocephalus, extra-axial collection or mass lesion/mass effect. Calcification along the falx. Mild diffuse cerebral atrophy. Vascular: Calcific atherosclerosis. Skull: No evidence of acute fracture. Sinuses/Orbits: Scattered ethmoid air cell fluid and mucosal thickening. No evidence of acute orbital abnormality. Other: Fluid in the incompletely imaged pharynx. IMPRESSION: No evidence of acute intracranial abnormality. Electronically Signed   By: Feliberto Harts MD   On: 02/12/2020 12:36   CT Angio Chest PE W and/or Wo Contrast  Result Date: 02/12/2020 CLINICAL DATA:  Patient found unresponsive today with agonal breathing. EXAM: CT ANGIOGRAPHY CHEST WITH CONTRAST TECHNIQUE: Multidetector CT imaging of the chest was performed using the standard protocol during bolus administration of intravenous contrast. Multiplanar CT image reconstructions and MIPs were obtained to evaluate the vascular anatomy. CONTRAST:  75 mL OMNIPAQUE IOHEXOL 350 MG/ML SOLN COMPARISON:  CT chest 12/01/2019. Single-view of the chest 12/01/2019 and 02/12/2020. FINDINGS: Cardiovascular: The exam is somewhat limited by patient motion. No pulmonary embolus is identified. Mild cardiomegaly. No pericardial effusion. Calcific aortic and coronary atherosclerosis. Mediastinum/Nodes: No enlarged mediastinal, hilar, or axillary lymph nodes. Thyroid gland, trachea, and esophagus demonstrate no significant findings. Endotracheal tube noted. Lungs/Pleura: Small bilateral pleural effusions. The lungs are emphysematous. Hazy bilateral airspace disease is  seen and there is interlobular septal thickening. Changes are diffuse. Upper Abdomen: NG tube is seen in the stomach. Musculoskeletal: No acute or focal abnormality. Review of the MIP images confirms the above findings. IMPRESSION: No pulmonary embolus is identified although the exam is somewhat degraded by patient motion. Interlobular septal thickening and diffuse hazy bilateral airspace disease most consistent with pulmonary edema in this patient with cardiomegaly and small pleural effusions. Aortic Atherosclerosis (ICD10-I70.0) and Emphysema (ICD10-J43.9). Electronically Signed   By: Drusilla Kanner M.D.   On: 02/12/2020 10:17   CARDIAC CATHETERIZATION  Result Date: 02/16/2020 Conclusions: 1. Severe two vessel coronary artery disease with diffuse, heavy calcification.  There are sequential 90% proximal, 50% mid, and 60% distal LAD stenoses, as well as chronic total occlusion at the ostium of a codominant RCA.  LCx has moderate diffuse disease. 2. Severely elevated left heart, right heart, and pulmonary artery pressures. 3. Normal Fick cardiac output/index.  Recommendations: 1. Aggressive diuresis and improved blood pressure control for management of acute HFpEF. 2. Transfer to tertiary care center for CABG versus PCI to LAD with atherectomy. 3. Secondary prevention of coronary artery disease. Yvonne Kendall, MD [facility]   DG Chest Port 1 View  Result Date: 02/14/2020 CLINICAL DATA:  Shortness of breath EXAM: PORTABLE CHEST 1 VIEW COMPARISON:  02/13/2020 FINDINGS: Interval removal of endotracheal and enteric tubes.  Cardiac enlargement. Bilateral pulmonary infiltrates. No significant change. IMPRESSION: Cardiac enlargement with bilateral pulmonary infiltrates. Electronically Signed   By: Burman Nieves M.D.   On: 02/14/2020 05:39   DG Chest Port 1 View  Result Date: 02/13/2020 CLINICAL DATA:  Patient was found unresponsive. Decreased oxygenation on room air. EXAM: PORTABLE CHEST 1 VIEW  COMPARISON:  02/12/2020 FINDINGS: Endotracheal and enteric tubes are unchanged in position. Increasing bilateral perihilar and basilar infiltration may indicate edema, aspiration, or pneumonia. Small left pleural effusion is suggested. Cardiac enlargement. Mediastinal contours appear intact. IMPRESSION: Increasing bilateral perihilar and basilar infiltration and small left pleural effusion. Electronically Signed   By: Burman Nieves M.D.   On: 02/13/2020 05:06   DG Chest Portable 1 View  Result Date: 02/12/2020 CLINICAL DATA:  Post intubation and OG placement. Unresponsive upon arrival. EXAM: PORTABLE CHEST 1 VIEW COMPARISON:  12/01/2019 FINDINGS: Endotracheal tube tip is approximately 4 cm above the carina. Orogastric tube courses below the diaphragm outside the field of view. Mild diffuse interstitial prominence. Ill-defined right perihilar opacities. Cardiomediastinal silhouette is within normal limits. No visible pneumothorax or pleural effusions on this limited supine portable radiograph. No evidence of acute osseous abnormality. IMPRESSION: 1. Endotracheal tube tip is approximately 4 cm above carina. 2. Orogastric tube courses below the diaphragm outside the field of view. 3. Mild diffuse interstitial prominence and ill-defined right perihilar opacities, which may represent mild pulmonary edema (favored), aspiration or pneumonia. Electronically Signed   By: Feliberto Harts MD   On: 02/12/2020 09:43   ECHOCARDIOGRAM COMPLETE  Result Date: 02/12/2020    ECHOCARDIOGRAM REPORT   Patient Name:   Kimberly Mccarthy Date of Exam: 02/12/2020 Medical Rec #:  [id number]        Height:       66.0 in Accession #:    [id number]       Weight:       276.0 lb Date of Birth:  April 14, 1956       BSA:          2.293 m Patient Age:    63 years         BP:           105/48 mmHg Patient Gender: F                HR:           65 bpm. Exam Location:  [facility] Procedure: 2D Echo, Color Doppler and Cardiac Doppler STAT ECHO  Indications:     I51.7 Cardiomegaly  History:         Patient has prior history of Echocardiogram examinations. CAD,                  Stroke and PVD; Risk Factors:Hypertension.  Sonographer:     Humphrey Rolls RDCS (AE) Referring Phys:  [id number] Zerita Boers SMITH Diagnosing Phys: Lorine Bears MD  Sonographer Comments: Suboptimal parasternal window and echo performed with patient supine and on artificial respirator. Image acquisition challenging due to patient body habitus. IMPRESSIONS  1. Left ventricular ejection fraction, by estimation, is 50 to 55%. The left ventricle has low normal function. Left ventricular endocardial border not optimally defined to evaluate regional wall motion. Left ventricular diastolic parameters are indeterminate.  2. Right ventricular systolic function is normal. The right ventricular size is normal. Tricuspid regurgitation signal is inadequate for assessing PA pressure.  3. The pericardial effusion is anterior to the right ventricle.  4. The mitral valve is normal in structure.  No evidence of mitral valve regurgitation. No evidence of mitral stenosis.  5. The aortic valve is normal in structure. Aortic valve regurgitation is not visualized. Mild to moderate aortic valve sclerosis/calcification is present, without any evidence of aortic stenosis.  6. The inferior vena cava is normal in size with <50% respiratory variability, suggesting right atrial pressure of 8 mmHg. FINDINGS  Left Ventricle: Left ventricular ejection fraction, by estimation, is 50 to 55%. The left ventricle has low normal function. Left ventricular endocardial border not optimally defined to evaluate regional wall motion. The left ventricular internal cavity  size was normal in size. There is no left ventricular hypertrophy. Left ventricular diastolic parameters are indeterminate. Right Ventricle: The right ventricular size is normal. No increase in right ventricular wall thickness. Right ventricular systolic function is  normal. Tricuspid regurgitation signal is inadequate for assessing PA pressure. Left Atrium: Left atrial size was normal in size. Right Atrium: Right atrial size was normal in size. Pericardium: Trivial pericardial effusion is present. The pericardial effusion is anterior to the right ventricle. Mitral Valve: The mitral valve is normal in structure. No evidence of mitral valve regurgitation. No evidence of mitral valve stenosis. MV peak gradient, 6.2 mmHg. The mean mitral valve gradient is 3.0 mmHg. Tricuspid Valve: The tricuspid valve is normal in structure. Tricuspid valve regurgitation is not demonstrated. No evidence of tricuspid stenosis. Aortic Valve: The aortic valve is normal in structure. Aortic valve regurgitation is not visualized. Mild to moderate aortic valve sclerosis/calcification is present, without any evidence of aortic stenosis. Aortic valve mean gradient measures 3.0 mmHg. Aortic valve peak gradient measures 6.7 mmHg. Aortic valve area, by VTI measures 2.18 cm. Pulmonic Valve: The pulmonic valve was normal in structure. Pulmonic valve regurgitation is not visualized. No evidence of pulmonic stenosis. Aorta: The aortic root is normal in size and structure. Venous: The inferior vena cava is normal in size with less than 50% respiratory variability, suggesting right atrial pressure of 8 mmHg. IAS/Shunts: No atrial level shunt detected by color flow Doppler.  LEFT VENTRICLE PLAX 2D LVIDd:         3.98 cm  Diastology LVIDs:         3.15 cm  LV e' medial:    5.98 cm/s LV PW:         1.08 cm  LV E/e' medial:  16.9 LV IVS:        0.99 cm  LV e' lateral:   7.07 cm/s LVOT diam:     2.10 cm  LV E/e' lateral: 14.3 LV SV:         63 LV SV Index:   27 LVOT Area:     3.46 cm  RIGHT VENTRICLE RV Basal diam:  3.89 cm LEFT ATRIUM             Index       RIGHT ATRIUM           Index LA Vol (A2C):   53.1 ml 23.16 ml/m RA Area:     12.20 cm LA Vol (A4C):   32.1 ml 14.00 ml/m RA Volume:   27.20 ml  11.86 ml/m LA  Biplane Vol: 42.8 ml 18.66 ml/m  AORTIC VALVE AV Area (Vmax):    2.26 cm AV Area (Vmean):   2.08 cm AV Area (VTI):     2.18 cm AV Vmax:           129.00 cm/s AV Vmean:          82.700  cm/s AV VTI:            0.288 m AV Peak Grad:      6.7 mmHg AV Mean Grad:      3.0 mmHg LVOT Vmax:         84.20 cm/s LVOT Vmean:        49.700 cm/s LVOT VTI:          0.181 m LVOT/AV VTI ratio: 0.63  AORTA Ao Root diam: 2.50 cm MITRAL VALVE                TRICUSPID VALVE MV Area (PHT): 4.52 cm     TR Peak grad:   29.2 mmHg MV Peak grad:  6.2 mmHg     TR Vmax:        270.00 cm/s MV Mean grad:  3.0 mmHg MV Vmax:       1.25 m/s     SHUNTS MV Vmean:      74.4 cm/s    Systemic VTI:  0.18 m MV Decel Time: 168 msec     Systemic Diam: 2.10 cm MV E velocity: 101.05 cm/s MV A velocity: 82.30 cm/s MV E/A ratio:  1.23 Lorine Bears MD Electronically signed by Lorine Bears MD Signature Date/Time: 02/12/2020/1:36:34 PM    Final     Microbiology: Recent Results (from the past 240 hour(s))  Respiratory Panel by RT PCR (Flu A&B, Covid) - Nasopharyngeal Swab     Status: None   Collection Time: 02/12/20  9:16 AM   Specimen: Nasopharyngeal Swab  Result Value Ref Range Status   SARS Coronavirus 2 by RT PCR NEGATIVE NEGATIVE Final    Comment: (NOTE) SARS-CoV-2 target nucleic acids are NOT DETECTED.  The SARS-CoV-2 RNA is generally detectable in upper respiratoy specimens during the acute phase of infection. The lowest concentration of SARS-CoV-2 viral copies this assay can detect is 131 copies/mL. A negative result does not preclude SARS-Cov-2 infection and should not be used as the sole basis for treatment or other patient management decisions. A negative result may occur with  improper specimen collection/handling, submission of specimen other than nasopharyngeal swab, presence of viral mutation(s) within the areas targeted by this assay, and inadequate number of viral copies (<131 copies/mL). A negative result must be  combined with clinical observations, patient history, and epidemiological information. The expected result is Negative.  Fact Sheet for Patients:  https://www.moore.com/  Fact Sheet for Healthcare Providers:  https://www.young.biz/  This test is no t yet approved or cleared by the [place] FDA and  has been authorized for detection and/or diagnosis of SARS-CoV-2 by FDA under an Emergency Use Authorization (EUA). This EUA will remain  in effect (meaning this test can be used) for the duration of the COVID-19 declaration under Section 564(b)(1) of the Act, 21 U.S.C. section 360bbb-3(b)(1), unless the authorization is terminated or revoked sooner.     Influenza A by PCR NEGATIVE NEGATIVE Final   Influenza B by PCR NEGATIVE NEGATIVE Final    Comment: (NOTE) The Xpert Xpress SARS-CoV-2/FLU/RSV assay is intended as an aid in  the diagnosis of influenza from Nasopharyngeal swab specimens and  should not be used as a sole basis for treatment. Nasal washings and  aspirates are unacceptable for Xpert Xpress SARS-CoV-2/FLU/RSV  testing.  Fact Sheet for Patients: https://www.moore.com/  Fact Sheet for Healthcare Providers: https://www.young.biz/  This test is not yet approved or cleared by the [place] FDA and  has been authorized for detection and/or diagnosis of SARS-CoV-2  by  FDA under an Emergency Use Authorization (EUA). This EUA will remain  in effect (meaning this test can be used) for the duration of the  Covid-19 declaration under Section 564(b)(1) of the Act, 21  U.S.C. section 360bbb-3(b)(1), unless the authorization is  terminated or revoked. Performed at [facility], [address] [place], [place] [address]   Blood culture (routine x 2)     Status: None (Preliminary result)   Collection Time: 02/12/20  1:15 PM   Specimen: BLOOD  Result Value Ref Range Status   Specimen  Description BLOOD BLOOD RIGHT HAND  Final   Special Requests   Final    BOTTLES DRAWN AEROBIC AND ANAEROBIC Blood Culture adequate volume   Culture   Final    NO GROWTH 4 DAYS Performed at [facility], [address] [place], [place] [address]    Report Status PENDING  Incomplete  Blood culture (routine x 2)     Status: None (Preliminary result)   Collection Time: 02/12/20  1:23 PM   Specimen: BLOOD  Result Value Ref Range Status   Specimen Description BLOOD BLOOD LEFT HAND  Final   Special Requests   Final    BOTTLES DRAWN AEROBIC AND ANAEROBIC Blood Culture adequate volume   Culture   Final    NO GROWTH 4 DAYS Performed at [facility], [address] [place], [place] [address]    Report Status PENDING  Incomplete     Labs: CBC: Recent Labs  Lab 02/12/20 0916 02/13/20 0541 02/14/20 0512 02/15/20 0458 02/16/20 0443  WBC 7.9 8.9 11.7* 7.9 8.5  NEUTROABS 3.5  --  9.9* 4.8 5.0  HGB 12.6 13.1 13.9 12.8 13.0  HCT 41.6 41.6 44.9 42.2 43.0  MCV 93.5 89.8 89.3 93.2 92.3  PLT 340 308 397 319 310   Basic Metabolic Panel: Recent Labs  Lab 02/12/20 0916 02/13/20 0541 02/14/20 0512 02/15/20 0458 02/16/20 0443  NA 139 144 148* 148* 147*  K 4.0 3.9 3.6 3.7 3.7  CL 102 106 104 104 102  CO2 34* 33*  GLUCOSE 275* 124* 144* 108* 115*  BUN 28* 33*  CREATININE 1.38* 1.13* 1.34* 1.34* 1.23*  CALCIUM 8.8* 9.2 9.4 8.9 9.1  MG 1.9  --  1.8 2.2 2.2  PHOS  --   --  2.9 3.5 3.1   Liver Function Tests: Recent Labs  Lab 02/12/20 0916 02/15/20 0919  AST 22 14*  ALT 17 16  ALKPHOS 95 76  BILITOT 0.8 0.7  PROT 8.0 7.2  ALBUMIN 4.2 3.8   CBG: Recent Labs  Lab 02/16/20 0010 02/16/20 0438 02/16/20 0730 02/16/20 0955 02/16/20 1216  GLUCAP 110* 107* 105* 111* 177*    Time spent: 35 minutes  Signed:  Lynden Oxford  [facility]  02/16/2020 2:06 PM

## 2020-02-16 NOTE — Progress Notes (Signed)
SLP Cancellation Note  Patient Details Name: Kimberly Mccarthy MRN: [id number] DOB: 11-Oct-1955   Cancelled treatment:       Reason Eval/Treat Not Completed: Patient at procedure or test/unavailable (chart reviewed). Pt w/ cardiac cath procedure today; NPO. Will f/u tomorrow w/ toleration of diet as appropriate. Recommend aspiration precautions; oral care.      Jerilynn Som, MS, CCC-SLP Speech Language Pathologist Rehab Services [phone] [facility] 02/16/2020, 1:50 PM

## 2020-02-16 NOTE — Progress Notes (Signed)
ANTICOAGULATION CONSULT NOTE  Pharmacy Consult for heparin Indication: chest pain/ACS  No Known Allergies  Patient Measurements: Height: 5\' 6"  (167.6 cm) Weight: 125.9 kg (277 lb 9 oz) IBW/kg (Calculated) : 59.3 Heparin Dosing Weight: 92 kg  Vital Signs: Temp: 99.1 F (37.3 C) (10/21 1800) Temp Source: Bladder (10/21 2000) BP: 187/75 (10/21 1800) Pulse Rate: 67 (10/21 1800)  Labs: Recent Labs    02/13/20 0541 02/13/20 0541 02/14/20 0512 02/14/20 0512 02/14/20 0748 02/15/20 0458 02/15/20 0914 02/15/20 1148 02/15/20 1717 02/15/20 2309  HGB 13.1   < > 13.9  --   --  12.8  --   --   --   --   HCT 41.6  --  44.9  --   --  42.2  --   --   --   --   PLT 308  --  397  --   --  319  --   --   --   --   HEPARINUNFRC  --   --   --   --   --   --   --   --  0.51 0.59  CREATININE 1.13*  --  1.34*  --   --  1.34*  --   --   --   --   TROPONINIHS  --   --  95*   < > 125*  --  97* 96*  --   --    < > = values in this interval not displayed.    Estimated Creatinine Clearance: 58.3 mL/min (A) (by C-G formula based on SCr of 1.34 mg/dL (H)).   Medical History: Past Medical History:  Diagnosis Date  . Anemia   . AVM (arteriovenous malformation)   . Carotid artery disease (HCC) 10/31/2015  . Chronic respiratory failure (HCC)   . COPD (chronic obstructive pulmonary disease) (HCC)   . Critical ischemia of lower extremity (HCC) 07/18/2014  . H/O blood clots   . Hyperkalemia   . Hypertension   . Hypothyroidism 09/05/2014  . Morbid obesity (HCC)   . PVD (peripheral vascular disease) (HCC)   . Renal failure   . Stroke [facility])    may 2015  . Thyroid disease     Assessment: 64 year old female admitted with PMH COPD, hx smoking 40 years, and hypoxic respiratory failure requiring intubation. Patient later extubated, c/o severe epigastric and abdominal pain. 10/18 CTA chest with no PE. 10/20 CT abd/pel negative for acute findings. EKG with profound ST depression. Cardiology consulted.  Plan for cath 10/22. Pharmacy consulted for heparin dosing and monitoring.  CBC stable  10/21 @1717  HL 0.51, therapeutic x1   Goal of Therapy:  Heparin level 0.3-0.7 units/ml Monitor platelets by anticoagulation protocol: Yes   Plan:  HL therapeutic - will continue current rate at 1150 units/hr Recheck HL in 6 hours to confirm Monitor CBC daily while on heparin drip.  10/21:  HL @ 2309 = 0.59 Will continue pt on current rate and recheck HL on 10/22 with AM labs.   Jabarie Pop D, PharmD 02/16/2020 1:22 AM

## 2020-02-16 NOTE — Progress Notes (Signed)
Progress Note  Patient Name: Kimberly Mccarthy Date of Encounter: 02/16/2020  Primary Cardiologist: New to [facility], consult by [facility]  Subjective   Dyspnea improving. No chest pain, palpitations, dizziness, presyncope, or syncope. SCr slightly improved this morning. BP remains elevated in the 160s systolic. She is for [facility] today.   Inpatient Medications    Scheduled Meds: . amLODipine  10 mg Oral Daily  . aspirin EC  81 mg Oral Daily  . atorvastatin  40 mg Oral Daily  . carvedilol  25 mg Oral BID WC  . Chlorhexidine Gluconate Cloth  6 each Topical Daily  . cloNIDine  0.3 mg Transdermal Weekly  . feeding supplement  237 mL Oral BID BM  . insulin aspart  0-15 Units Subcutaneous Q4H  . isosorbide mononitrate  30 mg Oral Once  . isosorbide mononitrate  60 mg Oral Daily  . levothyroxine  50 mcg Oral Q0600  . mouth rinse  15 mL Mouth Rinse BID  . methylPREDNISolone (SOLU-MEDROL) injection  20 mg Intravenous Q24H  . nitroGLYCERIN  1.5 inch Topical Q6H  . oxyCODONE  10 mg Oral Q12H  . pantoprazole  40 mg Oral BID AC  . polyethylene glycol  17 g Per Tube Daily  . senna-docusate  1 tablet Oral BID  . simethicone  80 mg Oral QID  . sodium chloride flush  3 mL Intravenous Q12H   Continuous Infusions: . sodium chloride    . heparin 1,150 Units/hr (02/15/20 1552)   PRN Meds: sodium chloride, acetaminophen (TYLENOL) oral liquid 160 mg/5 mL, calcium carbonate, cyclobenzaprine, hydrALAZINE, labetalol, morphine injection, nitroGLYCERIN, ondansetron (ZOFRAN) IV, sodium chloride flush, traMADol   Vital Signs    Vitals:   02/15/20 1800 02/15/20 2000 02/16/20 0500 02/16/20 0700  BP: (!) 187/75     Pulse: 67   66  Resp: 15   14  Temp: 99.1 F (37.3 C)   97.7 F (36.5 C)  TempSrc:  Bladder    SpO2: 97%   98%  Weight:   125 kg   Height:        Intake/Output Summary (Last 24 hours) at 02/16/2020 0732 Last data filed at 02/15/2020 1552 Gross per 24 hour  Intake 65.81 ml  Output  2000 ml  Net -1934.19 ml   Filed Weights   02/12/20 0907 02/14/20 0424 02/16/20 0500  Weight: 125.2 kg 125.9 kg 125 kg    Telemetry    SR with PVCs, 10 beat run of SVT, 5 beat run of NSVT - Personally Reviewed  ECG    No new tracings - Personally Reviewed  Physical Exam   GEN: No acute distress.   Neck: JVD difficult to assess secondary to body habitus. Cardiac: RRR, no murmurs, rubs, or gallops.  Respiratory: Clear to auscultation bilaterally. Supplemental oxygen via nasal cannula.  GI: Soft, nontender, non-distended.   MS: No edema; No deformity. Neuro:  Alert and oriented x 3; Nonfocal.  Psych: Normal affect.  Labs    Chemistry Recent Labs  Lab 02/12/20 [phone] 02/13/20 0541 02/14/20 0512 02/15/20 0458 02/15/20 0919 02/16/20 0443  NA 139   < > 148* 148*  --  147*  K 4.0   < > 3.6 3.7  --  3.7  CL 102   < > 104 104  --  102  CO2 24   < > 29 34*  --  33*  GLUCOSE 275*   < > 144* 108*  --  115*  BUN 20   < >  21 28*  --  33*  CREATININE 1.38*   < > 1.34* 1.34*  --  1.23*  CALCIUM 8.8*   < > 9.4 8.9  --  9.1  PROT 8.0  --   --   --  7.2  --   ALBUMIN 4.2  --   --   --  3.8  --   AST 22  --   --   --  14*  --   ALT 17  --   --   --  16  --   ALKPHOS 95  --   --   --  76  --   BILITOT 0.8  --   --   --  0.7  --   GFRNONAA 41*   < > 42* 42*  --  49*  ANIONGAP 13   < > 15 10  --  12   < > = values in this interval not displayed.     Hematology Recent Labs  Lab 02/14/20 0512 02/15/20 0458 02/16/20 0443  WBC 11.7* 7.9 8.5  RBC 5.03 4.53 4.66  HGB 13.9 12.8 13.0  HCT 44.9 42.2 43.0  MCV 89.3 93.2 92.3  MCH 27.6 28.3 27.9  MCHC 31.0 30.3 30.2  RDW 16.8* 16.9* 16.3*  PLT 397 319 310    Cardiac EnzymesNo results for input(s): TROPONINI in the last 168 hours. No results for input(s): TROPIPOC in the last 168 hours.   BNP Recent Labs  Lab 02/12/20 0916  BNP 320.8*     DDimer No results for input(s): DDIMER in the last 168 hours.   Radiology    CT  ABDOMEN PELVIS WO CONTRAST  Result Date: 02/14/2020 IMPRESSION: 1. No acute findings or explanation for the patient's symptoms. 2. Distal colonic diverticulosis without evidence of acute inflammation. 3. Interval improved aeration of the lung bases compared with recent chest CTA. 4. Aortic Atherosclerosis (ICD10-I70.0). Electronically Signed   By: Carey Bullocks M.D.   On: 02/14/2020 13:14    Cardiac Studies   2D echo 02/12/2020: 1. Left ventricular ejection fraction, by estimation, is 50 to 55%. The  left ventricle has low normal function. Left ventricular endocardial  border not optimally defined to evaluate regional wall motion. Left  ventricular diastolic parameters are  indeterminate.  2. Right ventricular systolic function is normal. The right ventricular  size is normal. Tricuspid regurgitation signal is inadequate for assessing  PA pressure.  3. The pericardial effusion is anterior to the right ventricle.  4. The mitral valve is normal in structure. No evidence of mitral valve  regurgitation. No evidence of mitral stenosis.  5. The aortic valve is normal in structure. Aortic valve regurgitation is  not visualized. Mild to moderate aortic valve sclerosis/calcification is  present, without any evidence of aortic stenosis.  6. The inferior vena cava is normal in size with <50% respiratory  variability, suggesting right atrial pressure of 8 mmHg. __________  2D echo 12/03/2014: - Left ventricle: The cavity size was normal. Systolic function was  normal. The estimated ejection fraction was 65%. Wall motion was  normal; there were no regional wall motion abnormalities. Doppler  parameters are consistent with abnormal left ventricular  relaxation (grade 1 diastolic dysfunction).   Impressions:   - Normal LVEF, mild diastolic dysfunction and normal wallmotion.  Patient Profile     64 y.o. female with history of CVA, chronic hypoxic respiratory failure secondary to  COPD secondary to prior tobacco use at 1/2 ppd x 40  years quitting approximately 2 years ago, PAD, CKD stage IIIa, morbid obesity with OSA and likely OHS on BiPAP, HTN, HLD, chronic pain syndrome on narcotic therapy, and hypothyroidism who is being seen today for the evaluation of chest pain at the request of Ms. Fayrene Fearing, NP.  Assessment & Plan    1. Elevated HS-Tn: -Currently, chest pain free -Possibly supply demand ischemia in the context of significant coronary artery calcification noted on CT in the setting of AECOPD and runs of narrow complex tachycardia, though unable to definitively exclude ACS given her symptoms, elevated troponin, and abnormal EKG -Echo this admission with low normal LVSF with an EF of 50-55% -She is for [facility] today -Heparin gtt -A1c 6.1 this admission -NPO -Risks and benefits of cardiac catheterization have been discussed with the patient including risks of bleeding, bruising, infection, kidney damage, stroke, heart attack, and death. The patient understands these risks and is willing to proceed with the procedure. All questions have been answered and concerns listened to  2. Narrow complex tachycardia: -Reviewed with MD, appears to be consistent with atrial tachycardia/SVT -Possibly contributing to her overall presentation -Burden improving  -Monitor on telemetry  -Coreg  3. Acute on chronic hypoxic and hypercapnic respiratory failure: -Intubated 10/18-->extubated 10/19 -Breathing significantly improved -Secondary to severe COPD exacerbation, morbid obesity with OSA and likely OHS -Cannot exclude some degree of volume overload/diastolic CHF -Defer further diuresis pending RHC today -PTA BiPAP  -Management per primary   4. CKD stage II: -Improving -Monitor post cath  5. PAD: -Follow up with [facility] as directed in the outpatient setting -ASA -Statin as below  6. HTN: -BP is suboptimally controlled -Amlodipine has been added this morning, yet to  receive -Otherwise, continue current regimen  7. HLD: -LDL 147 this admission with goal being < 70 -PTA pravastatin has been held, now on Lipitor -Follow up fasting lipid and liver function in ~ 8 weeks   For questions or updates, please contact [facility] Please consult [email or web address] for contact info under Cardiology/STEMI.    Signed, Eula Listen, PA-C [facility] Pager: [phone] 02/16/2020, 7:32 AM

## 2020-02-16 NOTE — Progress Notes (Signed)
Pt to cath lab at this time

## 2020-02-16 NOTE — Interval H&P Note (Signed)
History and Physical Interval Note:  02/16/2020 10:39 AM  Kimberly Mccarthy  has presented today for surgery, with the diagnosis of non ST segment myocardial infarction.  The various methods of treatment have been discussed with the patient and family. After consideration of risks, benefits and other options for treatment, the patient has consented to  Procedure(s): RIGHT/LEFT HEART CATH AND CORONARY ANGIOGRAPHY (N/A) as a surgical intervention.  The patient's history has been reviewed, patient examined, no change in status, stable for surgery.  I have reviewed the patient's chart and labs.  Questions were answered to the patient's satisfaction.    Cath Lab Visit (complete for each Cath Lab visit)  Clinical Evaluation Leading to the Procedure:   ACS: Yes.    Non-ACS:  N/A  [name]

## 2020-02-16 NOTE — Progress Notes (Signed)
Pt arrived back to unit from cath lab at this time. TR band in place. VSS on 3L [employer].

## 2020-02-17 DIAGNOSIS — E669 Obesity, unspecified: Secondary | ICD-10-CM | POA: Insufficient documentation

## 2020-02-17 DIAGNOSIS — I5030 Unspecified diastolic (congestive) heart failure: Secondary | ICD-10-CM

## 2020-02-17 DIAGNOSIS — G4733 Obstructive sleep apnea (adult) (pediatric): Secondary | ICD-10-CM | POA: Insufficient documentation

## 2020-02-17 DIAGNOSIS — I2510 Atherosclerotic heart disease of native coronary artery without angina pectoris: Secondary | ICD-10-CM | POA: Insufficient documentation

## 2020-02-17 DIAGNOSIS — Z87891 Personal history of nicotine dependence: Secondary | ICD-10-CM | POA: Insufficient documentation

## 2020-02-17 DIAGNOSIS — I2584 Coronary atherosclerosis due to calcified coronary lesion: Secondary | ICD-10-CM | POA: Insufficient documentation

## 2020-02-17 LAB — CULTURE, BLOOD (ROUTINE X 2)
Culture: NO GROWTH
Culture: NO GROWTH
Special Requests: ADEQUATE
Special Requests: ADEQUATE

## 2020-02-21 NOTE — Progress Notes (Deleted)
Cardiology Office Note    Date:  02/21/2020   ID:  Kimberly Mccarthy, DOB [birth year], MRN [id number]  PCP:  [facility]  Cardiologist:  Julien Nordmann, MD  Electrophysiologist:  None   Chief Complaint: Hospital follow up  History of Present Illness:   ILIYAH Mccarthy is a 64 y.o. female with history of multivessel CAD medically managed currently as outlined below, SVT/atrial tachycardia, CVA, chronic hypoxic respiratory failure secondary to COPD secondary to prior tobacco use at 1/2 ppd x 40 years quitting approximately 2 years ago, PAD, CKD stage IIIa, morbid obesity with OSA and likely OHS on BiPAP, HTN, HLD, chronic pain syndrome on narcotic therapy, and hypothyroidism who presents for hospital follow up after recent admission to [facility] with transfer to [facility] as outlined below.   [name] has significant history of PAD, followed by [facility] with 07/25/2015: Balloon angio of FA-PT RSVG, 10/16/14: RT fem endarterectomy, RT iliac thrombectomy and RT CIA angioplasty and CFA/bypass angioplasty, 8/27- Lt CFA and PFA, Lt femoral to posterior tibial artery bypass with reversed saphenous vein, CFA patchangioplasty/profundaplasty, 01/17/2014: Balloon thrombectomy CFA, EIA, CIA, PFA.  She was recently admitted in 11/2019 with acute on chronic hypercarbic respiratory failure treated with antibiotics, steroids, and nebs.   She was admitted to [facility] from 02/12/2020 to 10/22 with acute hypoxic respiratory failure requring intubation for 1 day in the setting of severe COPD exacerbation. Following extubation, she complained of severe chest pain with EKG showing diffuse st depression. Troponin peaked at 125. Echo showed an EF of 50-55%, normal RVSF and ventricular cavity size, pericardial effusion anterior to the RV, and mild to moderate aortic sclerosis without stenosis. She underwent R/LHC on 02/16/2020, that showed severe 2-vessel CAD with diffuse heavy calcification. There was sequential 90%  proximal, 50% mid, and 60% distal LAD stenoses, as well as CTO at the ostium of a codominant RCA. The LCx had moderate diffuse disease. There was severely elevated left heart, right heart, and pulmonary pressures. Low normal to mildly reduced cardiac output and index. She was transferred to [facility] for consideration of CABG vs atherectomy and PCI of the LAD. Repeat echo at [facility] showed an EF of 55%, Gr1DD, normal RVSF and cavity size, and no significant valvular abnormalities. Ultimately, she was medically managed at [facility] and discharged on 10/28.   ***   Labs independently reviewed: 01/2020 - potassium 3.7, BUN 30, SCr 1.42, HGB 12.0, PLT 257, magnesium 1.9, albumin 3.6, AST/ALT normal, TC 218, TG 157, HDL 43, LDL 144, A1c 5.8, TSH normal  Past Medical History:  Diagnosis Date  . Anemia   . AVM (arteriovenous malformation)   . Carotid artery disease (HCC) 10/31/2015  . Chronic respiratory failure (HCC)   . COPD (chronic obstructive pulmonary disease) (HCC)   . Critical ischemia of lower extremity (HCC) 07/18/2014  . H/O blood clots   . Hyperkalemia   . Hypertension   . Hypothyroidism 09/05/2014  . Morbid obesity (HCC)   . PVD (peripheral vascular disease) (HCC)   . Renal failure   . Stroke [facility])    may 2015  . Thyroid disease     Past Surgical History:  Procedure Laterality Date  . FEMORAL ENDARTERECTOMY Right   . left foot surgery  02/2014  . LEG SURGERY Left   . RIGHT/LEFT HEART CATH AND CORONARY ANGIOGRAPHY N/A 02/16/2020   Procedure: RIGHT/LEFT HEART CATH AND CORONARY ANGIOGRAPHY;  Surgeon: Yvonne Kendall, MD;  Location: [facility] INVASIVE CV LAB;  Service: Cardiovascular;  Laterality: N/A;    Current Medications: No outpatient medications have been marked as taking for the 02/28/20 encounter (Appointment) with Sondra Barges, PA-C.    Allergies:   Patient has no known allergies.   Social History   Socioeconomic History  . Marital status: Single    Spouse name: Not on file  .  Number of children: Not on file  . Years of education: Not on file  . Highest education level: Not on file  Occupational History  . Not on file  Tobacco Use  . Smoking status: Former Smoker    Packs/day: 0.33    Years: 20.00    Pack years: 6.60    Types: Cigarettes  . Smokeless tobacco: Never Used  Substance and Sexual Activity  . Alcohol use: No    Alcohol/week: 0.0 standard drinks  . Drug use: No  . Sexual activity: Not on file  Other Topics Concern  . Not on file  Social History Narrative  . Not on file   Social Determinants of Health   Financial Resource Strain:   . Difficulty of Paying Living Expenses: Not on file  Food Insecurity:   . Worried About Programme researcher, broadcasting/film/video in the Last Year: Not on file  . Ran Out of Food in the Last Year: Not on file  Transportation Needs:   . Lack of Transportation (Medical): Not on file  . Lack of Transportation (Non-Medical): Not on file  Physical Activity:   . Days of Exercise per Week: Not on file  . Minutes of Exercise per Session: Not on file  Stress:   . Feeling of Stress : Not on file  Social Connections:   . Frequency of Communication with Friends and Family: Not on file  . Frequency of Social Gatherings with Friends and Family: Not on file  . Attends Religious Services: Not on file  . Active Member of Clubs or Organizations: Not on file  . Attends Banker Meetings: Not on file  . Marital Status: Not on file     Family History:  The patient's family history includes Diabetes in her mother; Hypertension in her mother and sister; Thyroid disease in her sister. There is no history of Breast cancer.  ROS:   ROS   EKGs/Labs/Other Studies Reviewed:    Studies reviewed were summarized above. The additional studies were reviewed today:  2D echo 11/2014: - Left ventricle: The cavity size was normal. Systolic function was  normal. The estimated ejection fraction was 65%. Wall motion was  normal; there were  no regional wall motion abnormalities. Doppler  parameters are consistent with abnormal left ventricular  relaxation (grade 1 diastolic dysfunction).   Impressions:   - Normal LVEF, mild diastolic dysfunction and normal wall motion. __________  2D echo 02/12/2020: 1. Left ventricular ejection fraction, by estimation, is 50 to 55%. The  left ventricle has low normal function. Left ventricular endocardial  border not optimally defined to evaluate regional wall motion. Left  ventricular diastolic parameters are  indeterminate.  2. Right ventricular systolic function is normal. The right ventricular  size is normal. Tricuspid regurgitation signal is inadequate for assessing  PA pressure.  3. The pericardial effusion is anterior to the right ventricle.  4. The mitral valve is normal in structure. No evidence of mitral valve  regurgitation. No evidence of mitral stenosis.  5. The aortic valve is normal in structure. Aortic valve regurgitation is  not visualized. Mild to moderate aortic valve sclerosis/calcification is  present, without any evidence of aortic stenosis.  6. The inferior vena cava is normal in size with <50% respiratory  variability, suggesting right atrial pressure of 8 mmHg. __________  [facility] 02/16/2020: Conclusions: 1. Severe two vessel coronary artery disease with diffuse, heavy calcification. There are sequential 90% proximal, 50% mid, and 60% distal LAD stenoses, as well as chronic total occlusion at the ostium of a codominant RCA. LCx has moderate diffuse disease. 2. Severely elevated left heart, right heart, and pulmonary artery pressures. 3. Low normal to mildly reduced Fick cardiac output/index.  Recommendations: 1. Aggressive diuresis and improved blood pressure control for management of acute HFpEF. 2. Transfer to tertiary care center for CABG versus PCI to LAD with atherectomy. 3. Secondary prevention of coronary artery disease. __________  2D  echo 02/17/2020 [facility]): 1. Technically difficult study.  2. The left ventricle is normal in size with normal wall thickness.  3. The left ventricular systolic function is normal, LVEF is visually  estimated at 55%.  4. There is grade I diastolic dysfunction (impaired relaxation)- (CAD).  5. The right ventricle is normal in size, with normal systolic function.    EKG:  EKG is ordered today.  The EKG ordered today demonstrates ***  Recent Labs: 02/12/2020: B Natriuretic Peptide 320.8 02/14/2020: TSH 0.645 02/15/2020: ALT 16 02/16/2020: BUN 33; Creatinine, Ser 1.23; Hemoglobin 13.0; Magnesium 2.2; Platelets 310; Potassium 3.7; Sodium 147  Recent Lipid Panel    Component Value Date/Time   CHOL 226 (H) 02/15/2020 0919   CHOL 215 (H) 09/16/2012 0428   TRIG 121 02/16/2020 0443   TRIG 105 09/16/2012 0428   HDL 41 02/15/2020 0919   HDL 56 09/16/2012 0428   CHOLHDL 5.5 02/15/2020 0919   VLDL 38 02/15/2020 0919   VLDL 21 09/16/2012 0428   LDLCALC 147 (H) 02/15/2020 0919   LDLCALC 138 (H) 09/16/2012 0428    PHYSICAL EXAM:    VS:  There were no vitals taken for this visit.  BMI: There is no height or weight on file to calculate BMI.  Physical Exam  Wt Readings from Last 3 Encounters:  02/16/20 275 lb 9.2 oz (125 kg)  12/03/19 276 lb 0.3 oz (125.2 kg)  08/09/17 260 lb (117.9 kg)     ASSESSMENT & PLAN:   1. CAD involving the native coronary arteries without***angina:  2. SVT/atrial tachycardia:  3. Chronic hypoxic respiratory failure secondary to COPD:  4. PAD:  5. HTN: Blood pressure***  6. HLD: LDL 144 from 01/2020 with goal being less than 70.  ***  7. CKD stage III:  8. Morbid obesity with OSA:  Disposition: F/u with Dr. Mariah Milling or an APP in ***.   Medication Adjustments/Labs and Tests Ordered: Current medicines are reviewed at length with the patient today.  Concerns regarding medicines are outlined above. Medication changes, Labs and Tests ordered today  are summarized above and listed in the Patient Instructions accessible in Encounters.   Signed, Eula Listen, PA-C 02/21/2020 3:41 PM     [facility] [address] [place], [place] [address] [phone]

## 2020-02-28 ENCOUNTER — Ambulatory Visit: Payer: Medicare Other | Admitting: Physician Assistant

## 2020-02-29 ENCOUNTER — Telehealth: Payer: Self-pay | Admitting: Internal Medicine

## 2020-02-29 NOTE — Telephone Encounter (Signed)
Pt's sister, Claris Gower [phone]), dropped of FMLA to be filled out by Dr. Belia Heman. Will send them to [facility] for processing.   LVM for [facility] making her aware of process and fee.

## 2020-03-04 NOTE — Telephone Encounter (Signed)
Dr. Belia Heman - we rounded on this patient at [facility] - the patient sister is needing to be out of work intermittently to care for her. If I complete the form would you be willing to sign it? Thanks [employer]

## 2020-03-04 NOTE — Telephone Encounter (Signed)
Yes

## 2020-03-05 ENCOUNTER — Other Ambulatory Visit: Payer: Self-pay

## 2020-03-05 ENCOUNTER — Ambulatory Visit (INDEPENDENT_AMBULATORY_CARE_PROVIDER_SITE_OTHER): Payer: Medicare Other | Admitting: Family

## 2020-03-05 ENCOUNTER — Encounter: Payer: Self-pay | Admitting: Family

## 2020-03-05 ENCOUNTER — Other Ambulatory Visit: Payer: Self-pay | Admitting: Family

## 2020-03-05 VITALS — BP 114/64 | HR 62 | Ht 66.0 in | Wt 274.0 lb

## 2020-03-05 DIAGNOSIS — K219 Gastro-esophageal reflux disease without esophagitis: Secondary | ICD-10-CM

## 2020-03-05 DIAGNOSIS — I739 Peripheral vascular disease, unspecified: Secondary | ICD-10-CM

## 2020-03-05 DIAGNOSIS — I25118 Atherosclerotic heart disease of native coronary artery with other forms of angina pectoris: Secondary | ICD-10-CM | POA: Diagnosis not present

## 2020-03-05 DIAGNOSIS — I10 Essential (primary) hypertension: Secondary | ICD-10-CM

## 2020-03-05 DIAGNOSIS — Z8673 Personal history of transient ischemic attack (TIA), and cerebral infarction without residual deficits: Secondary | ICD-10-CM

## 2020-03-05 DIAGNOSIS — E785 Hyperlipidemia, unspecified: Secondary | ICD-10-CM | POA: Diagnosis not present

## 2020-03-05 DIAGNOSIS — I5032 Chronic diastolic (congestive) heart failure: Secondary | ICD-10-CM

## 2020-03-05 DIAGNOSIS — G4733 Obstructive sleep apnea (adult) (pediatric): Secondary | ICD-10-CM

## 2020-03-05 MED ORDER — EZETIMIBE 10 MG PO TABS: 10.0000 mg | ORAL_TABLET | Freq: Every day | ORAL | 5 refills | Status: DC

## 2020-03-05 MED ORDER — CLOPIDOGREL BISULFATE 75 MG PO TABS: 75.0000 mg | ORAL_TABLET | Freq: Every day | ORAL | 5 refills | Status: DC

## 2020-03-05 MED ORDER — CARVEDILOL 25 MG PO TABS: 25.0000 mg | ORAL_TABLET | Freq: Two times a day (BID) | ORAL | 5 refills | Status: DC

## 2020-03-05 MED ORDER — CARVEDILOL 25 MG PO TABS
25.0000 mg | ORAL_TABLET | Freq: Two times a day (BID) | ORAL | 5 refills | Status: DC
Start: 2020-03-05 — End: 2021-01-31

## 2020-03-05 MED ORDER — HYDRALAZINE HCL 100 MG PO TABS: 100.0000 mg | ORAL_TABLET | Freq: Three times a day (TID) | ORAL | 5 refills | Status: DC

## 2020-03-05 MED ORDER — ATORVASTATIN CALCIUM 80 MG PO TABS: 80.0000 mg | ORAL_TABLET | Freq: Every day | ORAL | 5 refills | Status: DC

## 2020-03-05 MED ORDER — AMLODIPINE BESYLATE 10 MG PO TABS: 10.0000 mg | ORAL_TABLET | Freq: Every day | ORAL | 5 refills | Status: DC

## 2020-03-05 MED ORDER — EMPAGLIFLOZIN 10 MG PO TABS: 10.0000 mg | ORAL_TABLET | Freq: Every day | ORAL | 5 refills | Status: DC

## 2020-03-05 NOTE — Telephone Encounter (Signed)
Called and left message for patient sister - needing to know if this is going to be intermittent leave or continuous to care for sister -pr

## 2020-03-05 NOTE — Telephone Encounter (Signed)
° ° °*  STAT* If patient is at the pharmacy, call can be transferred to refill team.   1. Which medications need to be refilled? (please list name of each medication and dose if known)   Carvedilol 25 mg po BID  2. Which pharmacy/location (including street and city if local pharmacy) is medication to be sent to?  [facility] [place]    3. Do they need a 30 day or 90 day supply? 30 day   Previously sent for qty 30 needs 60

## 2020-03-05 NOTE — Telephone Encounter (Signed)
Called and spoke to patient sister - pt needs continuous leave until 11/17, then intermittent from 03/13/2020 - next year. Completed forms and will give to Wyatt Mage today to bring back to [employer]. -pr

## 2020-03-05 NOTE — Telephone Encounter (Signed)
Rx request sent to pharmacy.  

## 2020-03-05 NOTE — Patient Instructions (Addendum)
Medication Instructions:  Your physician has recommended you make the following change in your medication:   STOP Clonidine  *If you need a refill on your cardiac medications before your next appointment, please call your pharmacy*   Lab Work: Your provider recommends that you return for lab work today for BMP, CBC  If you have labs (blood work) drawn today and your tests are completely normal, you will receive your results only by: Marland Kitchen MyChart Message (if you have MyChart) OR . A paper copy in the mail If you have any lab test that is abnormal or we need to change your treatment, we will call you to review the results.  Testing/Procedures: Your EKG today shows normal sinus rhythm.   Follow-Up: At [facility], you and your health needs are our priority.  As part of our continuing mission to provide you with exceptional heart care, we have created designated Provider Care Teams.  These Care Teams include your primary Cardiologist (physician) and Advanced Practice Providers (APPs -  Physician Assistants and Nurse Practitioners) who all work together to provide you with the care you need, when you need it.  We recommend signing up for the patient portal called "MyChart".  Sign up information is provided on this After Visit Summary.  MyChart is used to connect with patients for Virtual Visits (Telemedicine).  Patients are able to view lab/test results, encounter notes, upcoming appointments, etc.  Non-urgent messages can be sent to your provider as well.   To learn more about what you can do with MyChart, go to [email or web address].    Your next appointment:   6 week(s)  The format for your next appointment:   In Person  Provider:   You may see Julien Nordmann, MD or one of the following Advanced Practice Providers on your designated Care Team:    Nicolasa Ducking, NP  Eula Listen, PA-C  Gillian Shields, NP  Marisue Ivan, PA-C  Cadence Fransico Michael, [place]  Other  Instructions  Tips to Measure your Blood Pressure Correctly  To determine whether you have hypertension, a medical professional will take a blood pressure reading. How you prepare for the test, the position of your arm, and other factors can change a blood pressure reading by 10% or more. That could be enough to hide high blood pressure, start you on a drug you don't really need, or lead your doctor to incorrectly adjust your medications.  National and international guidelines offer specific instructions for measuring blood pressure. If a doctor, nurse, or medical assistant isn't doing it right, don't hesitate to ask him or her to get with the guidelines.  Here's what you can do to ensure a correct reading: . Don't drink a caffeinated beverage or smoke during the 30 minutes before the test. . Sit quietly for five minutes before the test begins. . During the measurement, sit in a chair with your feet on the floor and your arm supported so your elbow is at about heart level. . The inflatable part of the cuff should completely cover at least 80% of your upper arm, and the cuff should be placed on bare skin, not over a shirt. . Don't talk during the measurement. . Have your blood pressure measured twice, with a brief break in between. If the readings are different by 5 points or more, have it done a third time.  There are times to break these rules. If you sometimes feel lightheaded when getting out of bed in the morning or when you  stand after sitting, you should have your blood pressure checked while seated and then while standing to see if it falls from one position to the next.  Because blood pressure varies throughout the day, your doctor will rarely diagnose hypertension on the basis of a single reading. Instead, he or she will want to confirm the measurements on at least two occasions, usually within a few weeks of one another. The exception to this rule is if you have a blood pressure reading  of 180/110 mm Hg or higher. A result this high usually calls for prompt treatment.  It's a good idea to have your blood pressure measured in both arms at least once, since the reading in one arm (usually the right) may be higher than that in the left. A 2014 study in The American Journal of Medicine of nearly 3,400 people found average arm- to-arm differences in systolic blood pressure of about 5 points. The higher number should be used to make treatment decisions.  In 2017, new guidelines from the American Heart Association, the [employer] of Cardiology, and nine other health organizations lowered the diagnosis of high blood pressure to 130/80 mm Hg or higher for all adults. The guidelines also redefined the various blood pressure categories to now include normal, elevated, Stage 1 hypertension, Stage 2 hypertension, and hypertensive crisis (see "Blood pressure categories").  Blood pressure categories  Blood pressure category SYSTOLIC (upper number)  DIASTOLIC (lower number)  Normal Less than 120 mm Hg and Less than 80 mm Hg  Elevated 120-129 mm Hg and Less than 80 mm Hg  High blood pressure: Stage 1 hypertension 130-139 mm Hg or 80-89 mm Hg  High blood pressure: Stage 2 hypertension 140 mm Hg or higher or 90 mm Hg or higher  Hypertensive crisis (consult your doctor immediately) Higher than 180 mm Hg and/or Higher than 120 mm Hg  Source: American Heart Association and American Stroke Association. For more on getting your blood pressure under control, buy Controlling Your Blood Pressure, a Special Health Report from [facility].   Blood Pressure Log   Date   Time  Blood Pressure  Position  Example: Nov 1 9 AM 124/78 sitting

## 2020-03-05 NOTE — Progress Notes (Signed)
Office Visit    Patient Name: Kimberly MeierLeatha E Dascoli Date of Encounter: 03/05/2020  Primary Care Provider:  [facility] Primary Cardiologist:  Julien Nordmannimothy Gollan, MD Electrophysiologist:  None   Chief Complaint    Kimberly Mccarthy is a 64 y.o. female with a hx of CAD, HTN, chronic pain, hypothyroidism, CKD, PVD, CVA, COPD on QHS BIPAP, diastolic heart failure, CKD, obesity presents today for follow up after cardiac catheterization.   Past Medical History    Past Medical History:  Diagnosis Date  . Anemia   . AVM (arteriovenous malformation)   . Carotid artery disease (HCC) 10/31/2015  . Chronic respiratory failure (HCC)   . COPD (chronic obstructive pulmonary disease) (HCC)   . Critical ischemia of lower extremity (HCC) 07/18/2014  . H/O blood clots   . Hyperkalemia   . Hypertension   . Hypothyroidism 09/05/2014  . Morbid obesity (HCC)   . PVD (peripheral vascular disease) (HCC)   . Renal failure   . Stroke [facility](HCC)    may 2015  . Thyroid disease    Past Surgical History:  Procedure Laterality Date  . FEMORAL ENDARTERECTOMY Right   . left foot surgery  02/2014  . LEG SURGERY Left   . RIGHT/LEFT HEART CATH AND CORONARY ANGIOGRAPHY N/A 02/16/2020   Procedure: RIGHT/LEFT HEART CATH AND CORONARY ANGIOGRAPHY;  Surgeon: Yvonne KendallEnd, Christopher, MD;  Location: [facility] INVASIVE CV LAB;  Service: Cardiovascular;  Laterality: N/A;    Allergies  No Known Allergies  History of Present Illness    Kimberly Mccarthy is a 64 y.o. female with a hx of CAD, HTN, chronic pain, hypothyroidism, CKD, PVD, CVA, COPD on QHS BIPAP, diastolic heart failure, CKD, obesity last seen for cardiac catheterization 02/16/20.  [facility] with acute on chronic hypoxic respiratory failure due to COPD and diastolic heart failure intubated on admission. Echo 02/12/2020 EF 50 to 55%, RV normal size and function, pericardial effusion anterior to the right ventricle, mild to moderate  aortic sclerosis without stenosis.  She was extubated 02/13/2020 and noted epigastric and chest pain with EKG changes worrisome for ischemia.  R/LHC 02/16/2020 with severe two-vessel CAD with diffuse heavy calcification.  Sequential 90% proximal, 50% mid, 60% distal LAD stenoses as well as CTO at the ostium of a codominant RCA.  LCx with moderate diffuse disease.  She had severely elevated left heart, right heart and pulmonary pressures.  [facility] vs [facility]G.   At [facility] her disease burden was determined to be appropriate for medical management per their discharge summary. Recommended for intensive medical therapy with close follow up. She was discharged on Aspirin, Atorvastatin 80mg , Zetia 10mg , Plavix 75mg . She did not require diuresis [facility]. She was discharged 02/22/20 to SNF.  Presents today for follow up with her sister. She has been staying with her sister since SNF discharge. She reports no chest pain, pressure, tightness. No shortness of breath at rest. Does report some dyspnea on exertion. She has been walking at home for exercise and is working with home health physical therapy since her discharge from SNF. She does not add salt to her food, but does eat out at restaurants such as [employer] which we discussed would have high salt content. She has a BP cuff and scale at home and is agreeable to start monitoring. She did not realize her discharge packet from SNF a few days ago included paper prescriptions discovered today when I  opened the packet and she plans to take them to her pharmacy today.   EKGs/Labs/Other Studies Reviewed:   The following studies were reviewed today:  [facility] 02/16/20 Conclusions: 1. Severe two vessel coronary artery disease with diffuse, heavy calcification.  There are sequential 90% proximal, 50% mid, and 60% distal LAD stenoses, as well as chronic total occlusion at the ostium of a codominant RCA.  LCx has moderate diffuse  disease. 2. Severely elevated left heart, right heart, and pulmonary artery pressures. 3. Low normal to mildly reduced Fick cardiac output/index.   Recommendations: 1. Aggressive diuresis and improved blood pressure control for management of acute HFpEF. 2. Transfer to tertiary care center for CABG versus PCI to LAD with atherectomy. 3. Secondary prevention of coronary artery disease.  Echo 02/12/20  1. Left ventricular ejection fraction, by estimation, is 50 to 55%. The  left ventricle has low normal function. Left ventricular endocardial  border not optimally defined to evaluate regional wall motion. Left  ventricular diastolic parameters are  indeterminate.   2. Right ventricular systolic function is normal. The right ventricular  size is normal. Tricuspid regurgitation signal is inadequate for assessing  PA pressure.   3. The pericardial effusion is anterior to the right ventricle.   4. The mitral valve is normal in structure. No evidence of mitral valve  regurgitation. No evidence of mitral stenosis.   5. The aortic valve is normal in structure. Aortic valve regurgitation is  not visualized. Mild to moderate aortic valve sclerosis/calcification is  present, without any evidence of aortic stenosis.   6. The inferior vena cava is normal in size with <50% respiratory  variability, suggesting right atrial pressure of 8 mmHg.   EKG:  EKG is ordered today.  The ekg ordered today demonstrates NSR 62 bpm with no acute ST/T wave changes.  Recent Labs: 02/12/2020: B Natriuretic Peptide 320.8 02/14/2020: TSH 0.645 02/15/2020: ALT 16 02/16/2020: BUN 33; Creatinine, Ser 1.23; Hemoglobin 13.0; Magnesium 2.2; Platelets 310; Potassium 3.7; Sodium 147  Recent Lipid Panel    Component Value Date/Time   CHOL 226 (H) 02/15/2020 0919   CHOL 215 (H) 09/16/2012 0428   TRIG 121 02/16/2020 0443   TRIG 105 09/16/2012 0428   HDL 41 02/15/2020 0919   HDL 56 09/16/2012 0428   CHOLHDL 5.5  02/15/2020 0919   VLDL 38 02/15/2020 0919   VLDL 21 09/16/2012 0428   LDLCALC 147 (H) 02/15/2020 0919   LDLCALC 138 (H) 09/16/2012 0428    Home Medications   Current Meds  Medication Sig  . amLODipine (NORVASC) 10 MG tablet Take 1 tablet (10 mg total) by mouth daily.  Marland Kitchen aspirin EC 81 MG tablet Take 81 mg by mouth daily.  Marland Kitchen atorvastatin (LIPITOR) 80 MG tablet Take 1 tablet (80 mg total) by mouth daily.  . carvedilol (COREG) 25 MG tablet Take 1 tablet (25 mg total) by mouth 2 (two) times daily with a meal.  . clopidogrel (PLAVIX) 75 MG tablet Take 1 tablet (75 mg total) by mouth daily.  . empagliflozin (JARDIANCE) 10 MG TABS tablet Take 1 tablet (10 mg total) by mouth daily.  Marland Kitchen ezetimibe (ZETIA) 10 MG tablet Take 1 tablet (10 mg total) by mouth daily.  . famotidine (PEPCID) 20 MG tablet Take 20 mg by mouth 2 (two) times daily.  Marland Kitchen gabapentin (NEURONTIN) 300 MG capsule Take 300 mg by mouth See admin instructions. Take 1 capsule (300mg ) by mouth every morning, 1 capsule (300mg ) every afternoon and take 2 capsules (  600mg ) by mouth every night at bedtime  . hydrALAZINE (APRESOLINE) 100 MG tablet Take 1 tablet (100 mg total) by mouth in the morning, at noon, and at bedtime.  levothyroxine (SYNTHROID) 50 MCG tablet Take 50 mcg by mouth daily before breakfast.  . Multiple Vitamin (MULTIVITAMIN ADULT PO) Take by mouth daily.  Marland Kitchen oxyCODONE (OXYCONTIN) 10 mg 12 hr tablet Take 10 mg by mouth every 12 (twelve) hours.   . pantoprazole (PROTONIX) 40 MG tablet Take 40 mg by mouth 2 (two) times daily.  . polyethylene glycol (MIRALAX / GLYCOLAX) 17 g packet Take 17 g by mouth daily.  Marland Kitchen senna (SENOKOT) 8.6 MG tablet Take 1 tablet by mouth daily.  . Vitamin D, Ergocalciferol, (DRISDOL) 1.25 MG (50000 UNIT) CAPS capsule Take 50,000 Units by mouth once a week.  . [DISCONTINUED] amLODipine (NORVASC) 10 MG tablet Take 10 mg by mouth daily.  . [DISCONTINUED] atorvastatin (LIPITOR) 80 MG tablet Take 1 tablet by  mouth daily.  . [DISCONTINUED] carvedilol (COREG) 25 MG tablet Take 25 mg by mouth 2 (two) times daily with a meal.  . [DISCONTINUED] cloNIDine (CATAPRES) 0.2 MG tablet Take 0.2 mg by mouth at bedtime.  . [DISCONTINUED] clopidogrel (PLAVIX) 75 MG tablet Take by mouth.  . [DISCONTINUED] empagliflozin (JARDIANCE) 10 MG TABS tablet Take 10 mg by mouth daily.  . [DISCONTINUED] ezetimibe (ZETIA) 10 MG tablet Take 1 tablet by mouth daily.  . [DISCONTINUED] hydrALAZINE (APRESOLINE) 100 MG tablet Take 100 mg by mouth in the morning, at noon, and at bedtime.     Review of Systems    Review of Systems  Constitutional: Negative for chills, fever and malaise/fatigue.  Cardiovascular: Positive for dyspnea on exertion. Negative for chest pain, leg swelling, near-syncope, orthopnea, palpitations and syncope.  Respiratory: Negative for cough, shortness of breath and wheezing.   Gastrointestinal: Negative for nausea and vomiting.  Neurological: Negative for dizziness, light-headedness and weakness.   All other systems reviewed and are otherwise negative except as noted above.  Physical Exam    VS:  BP 114/64   Pulse 62   Ht 5\' 6"  (1.676 m)   Wt 274 lb (124.3 kg)   BMI 44.22 kg/m  , BMI Body mass index is 44.22 kg/m.  Wt Readings from Last 3 Encounters:  03/05/20 274 lb (124.3 kg)  02/16/20 275 lb 9.2 oz (125 kg)  12/03/19 276 lb 0.3 oz (125.2 kg)    GEN: Well nourished, well developed, in no acute distress. HEENT: normal. Neck: Supple, no JVD, carotid bruits, or masses. Cardiac: RRR, no murmurs, rubs, or gallops. No clubbing, cyanosis. Trace pedal edema.  Radials/DP/PT 2+ and equal bilaterally.  Respiratory:  Respirations regular and unlabored, clear to auscultation bilaterally. GI: Soft, nontender, nondistended. MS: No deformity or atrophy. Skin: Warm and dry, no rash. Neuro:  Strength and sensation are intact. Psych: Normal affect.  Assessment & Plan    1. HFpEF - 02/12/20 echo with  EF 50-55%. In setting of severe two vessel CAD, will need to monitor for worsening EF carefully. Euvolemic and well compensated on exam today. No indication for loop diuretic at this time. GDMT includes Coreg 25mg  BID, Jardiance 10mg  QD. Discussed low sodium, heart healthy diet. Discussed regular cardiovascular exercise.   2. CAD - Reports no chest pain, pressure, tightness. Does endorse DOE. R/LHC 02/16/20 with severe two vessel CAD with diffuse calcification (LAD with sequential 90% prox ? 50% mid ? 60% distal stenoses and CTO ostium of codominant RCA and LCx  with moderate diffuse disease). She was transferred to [facility] as a tertiary care center for CABG vs PCI to LAD with atherectomy. However, she was recommended for medical management. Her GDMT includes Aspirin 81mg  QD, Plavix 75mg  QD, Coreg 25mg  BID, Atorvastatin 80mg  QD, Zetia 10mg  QD. If she has recurrent anginal symptoms consider atherectomy and DES to LAD at [facility] vs consult to cardiothoracic surgery for CABG. Consider referral to cardiac rehab when she has completed HH PT.   3. HTN - BP low normal today in clinic. Goal per hospital discharge was to wean Clonidine - discontinue Clonidine. She will continue Coreg 25mg  BID, Hydralazine 100mg  TID. If additional antihypertensive agent needed, consider Imdur. She will monitor BP at home and report BP consistently >130/80.   4. Medication management - She is presently utilizing pill pack from discharge from SNF. Encouraged her to discuss medication management with he rpharmacy as I believe Medical can provide them.   5. HLD, LDL goal <70 - Lab work 02/15/20 with total cholesterol 226, HDL 41, LDL 147, triglycerides 192. Repeat triglycerides 02/16/20 were 121. She was starting on Atorvastatin 80mg  and Zetia 10mg  daily while hospitalized. We will plan to repeat lipid/liver panel at her next follow up. Lipid lowering diet was discussed.   6. COPD / Chronic hypoxic respiratory failure -  No signs of acute exacerbation. Continue to follow with primary care.   7. CKD - Careful titration of diuretics and antihypertensive agents. BMP today.   8. PAD / history of CVA / HLD, LDL goal <70 - Dr. of vascular surgery at [facility]. S/p multiple vascular interventions. Reports no symptoms of worsening claudication.   9. OSA / likely OHS - Compliance with BIPAP encouraged.   10. Chronic pain - Continue to follow with primary care provider.   11. GERD - Continue Pepcid and Famotidine. Continue to follow with PCP.  Disposition: Refill of cardiac medications provided today. Follow up in 6 week(s) with Dr. or APP   Signed, , NP 03/05/2020, 11:57 AM [facility]

## 2020-03-06 LAB — BASIC METABOLIC PANEL
BUN/Creatinine Ratio: 12 (ref 12–28)
BUN: 20 mg/dL (ref 8–27)
CO2: 23 mmol/L (ref 20–29)
Calcium: 9.2 mg/dL (ref 8.7–10.3)
Chloride: 103 mmol/L (ref 96–106)
Creatinine, Ser: 1.67 mg/dL — ABNORMAL HIGH (ref 0.57–1.00)
GFR calc Af Amer: 37 mL/min/{1.73_m2} — ABNORMAL LOW (ref >59)
GFR calc non Af Amer: 32 mL/min/{1.73_m2} — ABNORMAL LOW (ref >59)
Glucose: 101 mg/dL — ABNORMAL HIGH (ref 65–99)
Potassium: 4.9 mmol/L (ref 3.5–5.2)
Sodium: 142 mmol/L (ref 134–144)

## 2020-03-06 LAB — CBC WITH DIFFERENTIAL/PLATELET
Basophils Absolute: 0.1 10*3/uL (ref 0.0–0.2)
Basos: 1 %
EOS (ABSOLUTE): 0.1 10*3/uL (ref 0.0–0.4)
Eos: 2 %
Hematocrit: 38.1 % (ref 34.0–46.6)
Hemoglobin: 12.3 g/dL (ref 11.1–15.9)
Immature Grans (Abs): 0 10*3/uL (ref 0.0–0.1)
Immature Granulocytes: 0 %
Lymphocytes Absolute: 1.6 10*3/uL (ref 0.7–3.1)
Lymphs: 36 %
MCH: 27.8 pg (ref 26.6–33.0)
MCHC: 32.3 g/dL (ref 31.5–35.7)
MCV: 86 fL (ref 79–97)
Monocytes Absolute: 0.5 10*3/uL (ref 0.1–0.9)
Monocytes: 11 %
Neutrophils Absolute: 2.2 10*3/uL (ref 1.4–7.0)
Neutrophils: 50 %
Platelets: 349 10*3/uL (ref 150–450)
RBC: 4.43 x10E6/uL (ref 3.77–5.28)
RDW: 15 % (ref 11.7–15.4)
WBC: 4.4 10*3/uL (ref 3.4–10.8)

## 2020-03-07 ENCOUNTER — Telehealth: Payer: Self-pay | Admitting: Family

## 2020-03-07 NOTE — Telephone Encounter (Signed)
I spoke with pharmacist Kenard Gower) at [facility].  They received discharge paperwork from [facility] indicating patient was taking Losartan 50 mg daily.  This prescription was not with prescriptions patient brought in from [facility] to have filled.  Pharmacy has also received prescriptions sent in from recent visit with Gillian Shields, NP and this did not include Losartan. Kenard Gower has reached out to [employer] to clarify but has not heard back yet. He is asking if patient should be taking Losartan.  He will fax discharge paperwork he received to our office.  I told him Losartan was not on patient's current medication list and I would send message to Gillian Shields, NP to see if patient should be taking this medication.

## 2020-03-07 NOTE — Telephone Encounter (Signed)
Please call to discuss Losartan 50 mg.  Pharmacist states he is not sure if we need to prescribe this .

## 2020-03-07 NOTE — Telephone Encounter (Signed)
I spoke with Kenard Gower and told him Gillian Shields, NP did not feel patient needed Losartan at this time. I also told him Clonidine was stopped at last office visit

## 2020-03-07 NOTE — Telephone Encounter (Signed)
I did not see Losartan on her med list from [facility], but may have overlooked. At clinic yesterday I took all of cardiac prescriptions out of her discharge packet from [facility] which she brought with her and sent in electronically. I left the patient with the non-cardiac paper prescriptions to fill at her pharmacy as provided by [facility].   It does not appear she was prescribed Losartan at hospital discharge. I have a low suspicion she was taking it at home or prior to our clinic visit. Yesterday in clinic we were able to discontinue Clonidine due to low normal BP.   I do not believe she need the Losartan at this time. She and her sister in clinic yesterday were told to report BP consistently >130/80 and were understanding of those directions.   Best, Alver Sorrow, NP

## 2020-03-11 ENCOUNTER — Telehealth: Payer: Self-pay | Admitting: Family

## 2020-03-11 NOTE — Telephone Encounter (Signed)
I spoke with patient who is calling to see if she should be taking Losartan.  I told her she should not be taking this medication.  She will check BP at home and call if consistently greater than 130/80.  Patient located AVS from recent visit with updated medication list.

## 2020-03-11 NOTE — Telephone Encounter (Signed)
Patient is no longer at [facility] and needs to discuss medication.    Patient was not aware all medications she was to hold.   Please call to discuss.

## 2020-03-11 NOTE — Telephone Encounter (Signed)
Pts sister came pass office today for fmla paperwork. Informed her that Dr. Belia Heman did not have a chance to sign it yet. Informed her that we will call her when they are ready to be picked up.

## 2020-03-12 ENCOUNTER — Telehealth: Payer: Self-pay | Admitting: Family

## 2020-03-12 DIAGNOSIS — N170 Acute kidney failure with tubular necrosis: Secondary | ICD-10-CM

## 2020-03-12 NOTE — Telephone Encounter (Signed)
Patient calling in with weights 11/16 271 11/15 267.2 11/14 268.5 11/13 265  Patient has not noticed swelling

## 2020-03-12 NOTE — Telephone Encounter (Signed)
Spoke with the patient. Patient denies sob or swelling. She denies increased sodium in her diet, and is adhering to a low sodium diet.  After discussion with the patient, she has been monitoring her weights different times of the day and under different circumstances.  Advised the patient to weigh her self the same time every day under the same circumstances and keep track of her weights. Limit her sodium intake. Adv her to contact the office if her weight is up >3lbs in a day 5lbs in one week. She is to contact the office if symptoms develop (sob, swelling). POC also reviewed with Gillian Shields, NP and she is agreeable.

## 2020-03-13 ENCOUNTER — Telehealth: Payer: Self-pay | Admitting: *Deleted

## 2020-03-13 NOTE — Telephone Encounter (Signed)
Thank you for getting med list. Losartan noted on SNF med list though she has since been discharged to home. She was not taking Losartan at our recent clinic visit and BP well controlled - as such, will not resume. This recommendation has been previously relayed to the patient as well as to her pharmacy.   Alver Sorrow, NP

## 2020-03-13 NOTE — Telephone Encounter (Signed)
Pt was seen in office 03/05/20 and phone note from 11/11 states you were asking for pt's updated med list to verify if she was taking Losartan. Received med list from [employer] [employer] and pt is not taking Losartan.

## 2020-03-13 NOTE — Telephone Encounter (Signed)
Pt sister, Claris Gower, picked up fmla paperwork and paid $29 fee. Nothing further needed.

## 2020-03-20 ENCOUNTER — Telehealth: Payer: Self-pay | Admitting: Family

## 2020-03-20 ENCOUNTER — Other Ambulatory Visit: Payer: Self-pay | Admitting: Family

## 2020-03-20 DIAGNOSIS — I10 Essential (primary) hypertension: Secondary | ICD-10-CM

## 2020-03-20 MED ORDER — LOSARTAN POTASSIUM 25 MG PO TABS: 25.0000 mg | ORAL_TABLET | Freq: Every day | ORAL | 1 refills | Status: DC

## 2020-03-20 NOTE — Telephone Encounter (Signed)
Pt sister voiced understanding - will have labs done on Monday 11/29 as she already has an appointment and will be in the area that day - losartan 25 mg sent to [facility] as requested

## 2020-03-20 NOTE — Telephone Encounter (Signed)
[facility] therapist calling in stating patient is doping well with therapy but issues with BP. Patients BP has been high ( 168/80 and 160/65 both in the morning time) and  Nurse was advised when the top number went up they should call in to see if the losartan can be started again  Please call sister with recommendation at [phone] and if unavailable call patient at [phone]

## 2020-03-20 NOTE — Telephone Encounter (Signed)
Okay to resume losartan 25mg  daily. She may need an Rx sent into her pharmacy.   We checked labs a few weeks ago with changes in kidney function noted. She is due for repeat lab work. Please ask her to have lab work collected at the [facility] Friday 11/26 or Monday 11/29. She does not need an appointment, simply stop by between 8a-5p. She does not need to be fasting. I have placed the orders.   12/29, NP

## 2020-03-20 NOTE — Telephone Encounter (Signed)
Spoke with sister who says pt SBP has been 160s for the last few days pt c/o headaches daily - denies SOB/dizziness/chest pain/swelling - denies any recent medication changes - sister and pt wanting to know if she should restart losartan

## 2020-03-25 ENCOUNTER — Other Ambulatory Visit
Admission: RE | Admit: 2020-03-25 | Discharge: 2020-03-25 | Disposition: A | Discharge status: Home / Self Care | Payer: Medicare Other | Source: Ambulatory Visit | Attending: Family | Admitting: Family

## 2020-03-25 DIAGNOSIS — I10 Essential (primary) hypertension: Secondary | ICD-10-CM | POA: Insufficient documentation

## 2020-03-25 LAB — BASIC METABOLIC PANEL
Anion gap: 10 (ref 5–15)
BUN: 17 mg/dL (ref 8–23)
CO2: 27 mmol/L (ref 22–32)
Calcium: 9.2 mg/dL (ref 8.9–10.3)
Chloride: 103 mmol/L (ref 98–111)
Creatinine, Ser: 1.22 mg/dL — ABNORMAL HIGH (ref 0.44–1.00)
GFR, Estimated: 50 mL/min — ABNORMAL LOW (ref >60)
Glucose, Bld: 100 mg/dL — ABNORMAL HIGH (ref 70–99)
Potassium: 4.1 mmol/L (ref 3.5–5.1)
Sodium: 140 mmol/L (ref 135–145)

## 2020-03-27 ENCOUNTER — Telehealth: Payer: Self-pay

## 2020-03-27 NOTE — Telephone Encounter (Signed)
Pt was contacted by mobile, advised pt of Caitlyn, NP notes of current labs   "Renal function stable. Good result. Continue current medications.". Pt has no questions or concerns at this time. Will call back for anything further

## 2020-03-27 NOTE — Telephone Encounter (Signed)
-----   Message from Alver Sorrow, NP sent at 03/25/2020  9:01 PM EST ----- Renal function stable. Good result. Continue current medications.

## 2020-03-29 ENCOUNTER — Other Ambulatory Visit: Payer: Self-pay | Admitting: Family

## 2020-03-29 NOTE — Telephone Encounter (Signed)
Rx request sent to pharmacy.  

## 2020-04-12 ENCOUNTER — Telehealth: Payer: Self-pay | Admitting: Family

## 2020-04-12 NOTE — Telephone Encounter (Signed)
Pt c/o medication issue:  1. Name of Medication: jardiance  2. How are you currently taking this medication (dosage and times per day)? unsure  3. Are you having a reaction (difficulty breathing--STAT)? no  4. What is your medication issue? Patient sister sheila calling to discuss.  To her knowledge patient has not been dx with dm and is not checking her blood sugar.  Patient sister wants to make sure she is following the right POC.   Please call to discuss

## 2020-04-12 NOTE — Telephone Encounter (Signed)
Pt was started on Jardiance at [facility] 02/17/20 for HFpEF. I spoke to pt's sister, Velna Hatchet, and explained that this medication is used for heart failure and pt is not required to check blood sugars while taking. She reports pt has had no problems with medication while taking. Velna Hatchet verbalized understanding of POC and medication use after discussed. She is appreciative for explanation and has no further questions at this time.

## 2020-04-16 ENCOUNTER — Encounter: Payer: Self-pay | Admitting: Family

## 2020-04-16 ENCOUNTER — Ambulatory Visit (INDEPENDENT_AMBULATORY_CARE_PROVIDER_SITE_OTHER): Payer: Medicare Other | Admitting: Family

## 2020-04-16 ENCOUNTER — Other Ambulatory Visit: Payer: Self-pay | Admitting: *Deleted

## 2020-04-16 ENCOUNTER — Other Ambulatory Visit: Payer: Self-pay

## 2020-04-16 VITALS — BP 140/70 | HR 69 | Ht 66.0 in | Wt 271.0 lb

## 2020-04-16 DIAGNOSIS — I10 Essential (primary) hypertension: Secondary | ICD-10-CM | POA: Diagnosis not present

## 2020-04-16 DIAGNOSIS — I5032 Chronic diastolic (congestive) heart failure: Secondary | ICD-10-CM

## 2020-04-16 DIAGNOSIS — I779 Disorder of arteries and arterioles, unspecified: Secondary | ICD-10-CM

## 2020-04-16 MED ORDER — LOSARTAN POTASSIUM 50 MG PO TABS: 50.0000 mg | ORAL_TABLET | Freq: Every day | ORAL | 1 refills | Status: DC

## 2020-04-16 NOTE — Patient Instructions (Signed)
Medication Instructions:  Your physician has recommended you make the following change in your medication:  CHANGE Losartan to 50mg  once daily  *If you need a refill on your cardiac medications before your next appointment, please call your pharmacy*   Lab Work: No lab work ordered today.   Testing/Procedures: Your EKG today was stable compared to previous.   Follow-Up: At [facility], you and your health needs are our priority.  As part of our continuing mission to provide you with exceptional heart care, we have created designated Provider Care Teams.  These Care Teams include your primary Cardiologist (physician) and Advanced Practice Providers (APPs -  Physician Assistants and Nurse Practitioners) who all work together to provide you with the care you need, when you need it.  We recommend signing up for the patient portal called "MyChart".  Sign up information is provided on this After Visit Summary.  MyChart is used to connect with patients for Virtual Visits (Telemedicine).  Patients are able to view lab/test results, encounter notes, upcoming appointments, etc.  Non-urgent messages can be sent to your provider as well.   To learn more about what you can do with MyChart, go to [facility].    Your next appointment:   3 month(s)  The format for your next appointment:   In Person  Provider:   You may see [email or web address], MD or one of the following Advanced Practice Providers on your designated Care Team:    Julien Nordmann, NP  Nicolasa Ducking, PA-C  Eula Listen, PA-C  Cadence Mokena, [place]  [place], NP  Other Instructions   You have been referred to cardiac rehab. This is a combination program including monitored exercise, dietary education, and support group. We strongly recommend participating in the program. Expect a phone call from them in approximately 2 weeks. If you do not hear from them, the phone number for cardiac rehab at [facility] is  [phone].

## 2020-04-16 NOTE — Progress Notes (Signed)
Office Visit    Patient Name: Kimberly Mccarthy Date of Encounter: 04/16/2020  Primary Care Provider:  [facility] Primary Cardiologist:  Julien Nordmann, MD Electrophysiologist:  None   Chief Complaint    Kimberly Mccarthy is a 64 y.o. female with a hx of CAD, HTN, chronic pain, hypothyroidism, CKD, PVD, CVA, COPD on QHS BIPAP, diastolic heart failure, CKD, obesity presents today for follow up of heart failure and CAD.   Past Medical History    Past Medical History:  Diagnosis Date  . Anemia   . AVM (arteriovenous malformation)   . Carotid artery disease (HCC) 10/31/2015  . Chronic respiratory failure (HCC)   . COPD (chronic obstructive pulmonary disease) (HCC)   . Critical ischemia of lower extremity (HCC) 07/18/2014  . H/O blood clots   . Hyperkalemia   . Hypertension   . Hypothyroidism 09/05/2014  . Morbid obesity (HCC)   . PVD (peripheral vascular disease) (HCC)   . Renal failure   . Stroke [facility])    may 2015  . Thyroid disease    Past Surgical History:  Procedure Laterality Date  . FEMORAL ENDARTERECTOMY Right   . left foot surgery  02/2014  . LEG SURGERY Left   . RIGHT/LEFT HEART CATH AND CORONARY ANGIOGRAPHY N/A 02/16/2020   Procedure: RIGHT/LEFT HEART CATH AND CORONARY ANGIOGRAPHY;  Surgeon: Yvonne Kendall, MD;  Location: [facility] INVASIVE CV LAB;  Service: Cardiovascular;  Laterality: N/A;    Allergies  No Known Allergies  History of Present Illness    Kimberly Mccarthy is a 64 y.o. female with a hx of CAD, HTN, chronic pain, hypothyroidism, CKD, PVD, CVA, COPD on QHS BIPAP, diastolic heart failure, CKD, obesity last seen in clinic 03/05/20.   She presented to [facility] 02/12/2020 via EMS with acute on chronic hypoxic respiratory failure due to COPD and diastolic heart failure intubated on admission. Echo 02/12/2020 EF 50 to 55%, RV normal size and function, pericardial effusion anterior to the right ventricle, mild to moderate aortic sclerosis  without stenosis.  She was extubated 02/13/2020 and noted epigastric and chest pain with EKG changes worrisome for ischemia.  R/LHC 02/16/2020 with severe two-vessel CAD with diffuse heavy calcification.  Sequential 90% proximal, 50% mid, 60% distal LAD stenoses as well as CTO at the ostium of a codominant RCA.  LCx with moderate diffuse disease.  She had severely elevated left heart, right heart and pulmonary pressures.  She was transferred to [facility] for complex PCI vs CABG.   At [facility] her disease burden was determined to be appropriate for medical management per their discharge summary. Recommended for intensive medical therapy with close follow up. She was discharged on Aspirin, Atorvastatin 80mg , Zetia 10mg , Plavix 75mg . She did not require diuresis while at [facility]. She was discharged 02/22/20 to SNF.  Seen in follow up 03/05/20. She had just been discharged from SNF to her sisters home. She was not adding salt to her food but noted to be eating out a lot at restaurants such as [employer].  Her blood pressure was low normal and as documentation for hospitalization stated goal to wean clonidine clonidine was discontinued.  Her Coreg 25 mg  BID and hydralazine 100mg  TID were continued.   Losartan 25mg  QD was started 03/20/2020 due to elevated blood pressure.  Repeat BMP 03/25/2020 was unremarkable.  She presents today for follow-up with her sister.  Her weight is down 3 pounds since last clinic visit. She reports feeling overall well. Working with  home health PT and is hopeful to continue to increase her exercise frequency and tolerance. Systolic blood pressure at home 130s-140s. Denies edema, orthopnea, PND. Endorses eating a heart healthy diet.   EKGs/Labs/Other Studies Reviewed:   The following [facility] were [facility] today[facility]  [facility]: 1. Severe two vessel coronary artery disease with diffuse, heavy calcification.  There are sequential 90% proximal, 50% mid, and 60% distal LAD stenoses, as  well as chronic total occlusion at the ostium of a codominant RCA.  LCx has moderate diffuse disease. 2. Severely elevated left heart, right heart, and pulmonary artery pressures. 3. Low normal to mildly reduced Fick cardiac output/index.   Recommendations: 1. Aggressive diuresis and improved blood pressure control for management of acute HFpEF. 2. Transfer to tertiary care center for CABG versus PCI to LAD with atherectomy. 3. Secondary prevention of coronary artery disease.  Echo 02/12/20  1. Left ventricular ejection fraction, by estimation, is 50 to 55%. The  left ventricle has low normal function. Left ventricular endocardial  border not optimally defined to evaluate regional wall motion. Left  ventricular diastolic parameters are  indeterminate.   2. Right ventricular systolic function is normal. The right ventricular  size is normal. Tricuspid regurgitation signal is inadequate for assessing  PA pressure.   3. The pericardial effusion is anterior to the right ventricle.   4. The mitral valve is normal in structure. No evidence of mitral valve  regurgitation. No evidence of mitral stenosis.   5. The aortic valve is normal in structure. Aortic valve regurgitation is  not visualized. Mild to moderate aortic valve sclerosis/calcification is  present, without any evidence of aortic stenosis.   6. The inferior vena cava is normal in size with <50% respiratory  variability, suggesting right atrial pressure of 8 mmHg.   EKG:  EKG is ordered today.  The ekg ordered today demonstrates NSR 69 bpm with occasional PVC no acute ST/T wave changes.  Recent Labs: 02/12/2020: B Natriuretic Peptide 320.8 02/14/2020: TSH 0.645 02/15/2020: ALT 16 02/16/2020: Magnesium 2.2 03/05/2020: Hemoglobin 12.3; Platelets 349 03/25/2020: BUN 17; Creatinine, Ser 1.22; Potassium 4.1; Sodium 140  Recent Lipid Panel    Component Value Date/Time   CHOL 226 (H) 02/15/2020 0919   CHOL 215 (H) 09/16/2012 0428    TRIG 121 02/16/2020 0443   TRIG 105 09/16/2012 0428   HDL 41 02/15/2020 0919   HDL 56 09/16/2012 0428   CHOLHDL 5.5 02/15/2020 0919   VLDL 38 02/15/2020 0919   VLDL 21 09/16/2012 0428   LDLCALC 147 (H) 02/15/2020 0919   LDLCALC 138 (H) 09/16/2012 0428    Home Medications   Current Meds  Medication Sig  . amLODipine (NORVASC) 10 MG tablet Take 1 tablet (10 mg total) by mouth daily.  Marland Kitchen. aspirin EC 81 MG tablet Take 81 mg by mouth daily.  Marland Kitchen. atorvastatin (LIPITOR) 80 MG tablet Take 1 tablet (80 mg total) by mouth daily.  . carvedilol (COREG) 25 MG tablet Take 1 tablet (25 mg total) by mouth 2 (two) times daily with a meal.  . clopidogrel (PLAVIX) 75 MG tablet Take 1 tablet (75 mg total) by mouth daily.  . empagliflozin (JARDIANCE) 10 MG TABS tablet Take 1 tablet (10 mg total) by mouth daily.  Marland Kitchen. ezetimibe (ZETIA) 10 MG tablet Take 1 tablet (10 mg total) by mouth daily.  . famotidine (PEPCID) 20 MG tablet TAKE 1 TABLET BY MOUTH TWICE A DAY RELATED TO GASTROESOPHAGEAL REFLUX DISEASE  . gabapentin (NEURONTIN) 300 MG  capsule Take 300 mg by mouth See admin instructions. Take 1 capsule (300mg ) by mouth every morning, 1 capsule (300mg ) every afternoon and take 2 capsules (600mg ) by mouth every night at bedtime  . hydrALAZINE (APRESOLINE) 100 MG tablet Take 1 tablet (100 mg total) by mouth in the morning, at noon, and at bedtime.  levothyroxine (SYNTHROID) 50 MCG tablet Take 50 mcg by mouth daily before breakfast.  . losartan (COZAAR) 25 MG tablet Take 1 tablet (25 mg total) by mouth daily.  . Multiple Vitamin (MULTIVITAMIN ADULT PO) Take by mouth daily.  oxyCODONE (OXYCONTIN) 10 mg 12 hr tablet Take 10 mg by mouth every 12 (twelve) hours.   . pantoprazole (PROTONIX) 40 MG tablet Take 40 mg by mouth 2 (two) times daily.  . polyethylene glycol (MIRALAX / GLYCOLAX) 17 g packet Take 17 g by mouth daily.  senna (SENOKOT) 8.6 MG tablet Take 1 tablet by mouth daily.  . Vitamin D, Ergocalciferol,  (DRISDOL) 1.25 MG (50000 UNIT) CAPS capsule Take 50,000 Units by mouth once a week.     Review of Systems    Review of Systems  Constitutional: Negative for chills, fever and malaise/fatigue.  Cardiovascular: Positive for dyspnea on exertion (improving). Negative for chest pain, leg swelling, near-syncope, orthopnea, palpitations and syncope.  Respiratory: Negative for cough, shortness of breath and wheezing.   Gastrointestinal: Negative for nausea and vomiting.  Neurological: Negative for dizziness, light-headedness and weakness.   All other systems reviewed and are otherwise negative except as noted above.  Physical Exam    VS:  BP 140/70 (BP Location: Left Arm, Patient Position: Sitting, Cuff Size: Large)   Pulse 69   Ht 5\' 6"  (1.676 m)   Wt 271 lb (122.9 kg)   SpO2 96%   BMI 43.74 kg/m  , BMI Body mass index is 43.74 kg/m.  Wt Readings from Last 3 Encounters:  04/16/20 271 lb (122.9 kg)  03/05/20 274 lb (124.3 kg)  02/16/20 275 lb 9.2 oz (125 kg)    GEN: Well nourished, overweight,  well developed, in no acute distress. HEENT: normal. Neck: Supple, no JVD, carotid bruits, or masses. Cardiac: RRR, no murmurs, rubs, or gallops. No clubbing, cyanosis. Trace pedal edema.  Radials/DP/PT 2+ and equal bilaterally.  Respiratory:  Respirations regular and unlabored, clear to auscultation bilaterally. GI: Soft, nontender, nondistended. MS: No deformity or atrophy. Skin: Warm and dry, no rash. Neuro:  Strength and sensation are intact. Psych: Normal affect.  Assessment & Plan    1. HFpEF - 02/12/20 echo with EF 50-55%. In setting of severe two vessel CAD, will need to monitor for worsening EF carefully. Euvolemic and well compensated on exam today. Down 3 pounds. No indication for loop diuretic at this time. GDMT includes Coreg 25mg  BID, Jardiance 10mg  QD. Discussed low sodium, heart healthy diet. Discussed regular cardiovascular exercise. Referral placed to cardiac rehab.     2. CAD - Reports no chest pain, pressure, tightness. Does endorse DOE which is overall improving. R/LHC 02/16/20 with severe two vessel CAD with diffuse calcification (LAD with sequential 90% prox ? 50% mid ? 60% distal stenoses and CTO ostium of codominant RCA and LCx with moderate diffuse disease). She was transferred to [facility] as a tertiary care center for CABG vs PCI to LAD with atherectomy. However, she was recommended for medical management. Her GDMT includes Aspirin 81mg  QD, Plavix 75mg  QD, Coreg 25mg  BID, Atorvastatin 80mg  QD, Zetia 10mg  QD. If she has recurrent anginal symptoms consider  atherectomy and DES to LAD at [facility] vs consult to cardiothoracic surgery for CABG. Referred to cardiac rehab today.   3. HTN - BP mildly elevated at home and in clinic. Increase Losartan to 50mg  daily. Continue  Coreg 25mg  BID, Hydralazine 100mg  TID.  4. HLD, LDL goal <70 - Lab work 02/15/20 with total cholesterol 226, HDL 41, LDL 147, triglycerides 192. Repeat triglycerides 02/16/20 were 121. She was starting on Atorvastatin 80mg  and Zetia 10mg  daily while hospitalized. Will need lipid panel at next follow up - defer today as she was not fasting.   5. COPD / Chronic hypoxic respiratory failure - No signs of acute exacerbation. Continue to follow with primary care.   6. CKD - Careful titration of diuretics and antihypertensive agents.   7. PAD / history of CVA / HLD, LDL goal <70 - Follow with  vascular surgery at [facility]. S/p multiple vascular interventions. Reports no symptoms of worsening claudication. Continue statin.   8. OSA / likely OHS - Compliance with BIPAP encouraged.   9. Chronic pain - Continue to follow with primary care provider.   10. GERD - Continue Pepcid and Famotidine. Continue to follow with PCP.  Disposition: Follow up in 3 month(s) with Dr. 02/17/20 or APP   Signed, 02/18/20, NP 04/16/2020, 11:24 AM [facility]

## 2020-05-03 ENCOUNTER — Telehealth: Payer: Self-pay | Admitting: Cardiovascular Disease

## 2020-05-03 NOTE — Telephone Encounter (Signed)
Patient o2 sats running 88-90 on RA Per [facility] nurse Christy .    Patient not feeling well since recent visit to [facility] where she had fatigue with exertion .   Please call.

## 2020-05-03 NOTE — Telephone Encounter (Signed)
Called pt back, pt reports last Tuesday her friend took her to her [facility] appt for her chronic leg issues, reports friends BF was smoking in car and they made her walk long distance to get to her appt, which made her winded and shob, pt reports she feels better now, but took her a few days to recover. Pt reports her home health nurse got "excited cus she saw my pulse ox reading 89-90%" while on room air. Pt reports she took a deep breath and her pulse ox increased to 95%. Pt reports this happens sometimes because "my nails are too long and I do my own acrylic on my nails". Pt reports she "feels fine, occasional winded when up doing things or walking long distance, but that's my norm". Pt stated is going to get nails trimmed so no more issues with pulse ox reading. No shob or cp at this time. Pt has no concerns or questions, st she is fine. Home health nurse was concern. Advised pt if she does get extreme shob, winded and her sats keep dropping, then to seek medical attention over the weekend. Otherwise no additional concerns at this time. Agreeable to plan, will call back for anything further.

## 2020-05-13 ENCOUNTER — Other Ambulatory Visit: Payer: Self-pay | Admitting: Family

## 2020-06-17 ENCOUNTER — Other Ambulatory Visit: Payer: Self-pay | Admitting: Family

## 2020-06-21 ENCOUNTER — Telehealth (INDEPENDENT_AMBULATORY_CARE_PROVIDER_SITE_OTHER): Payer: Self-pay | Admitting: Gastroenterology

## 2020-06-21 DIAGNOSIS — Z1211 Encounter for screening for malignant neoplasm of colon: Secondary | ICD-10-CM

## 2020-06-21 DIAGNOSIS — D649 Anemia, unspecified: Secondary | ICD-10-CM | POA: Insufficient documentation

## 2020-06-21 DIAGNOSIS — E782 Mixed hyperlipidemia: Secondary | ICD-10-CM | POA: Insufficient documentation

## 2020-06-21 NOTE — Progress Notes (Signed)
Patient has declined scheduling her colonoscopy at this time due to CHF.  She is not comfortable scheduling at this time.  She would like to discuss with her cardiologist.  Gastroenterology Pre-Procedure Review  Request Date: Not Scheduled Requesting Physician: Dr. Jodelle Gross  PATIENT REVIEW QUESTIONS: The patient responded to the following health history questions as indicated:    1. Are you having any GI issues? no 2. Do you have a personal history of Polyps? no 3. Do you have a family history of Colon Cancer or Polyps? no 4. Diabetes Mellitus? no 5. Joint replacements in the past 12 months?no 6. Major health problems in the past 3 months?no 7. Any artificial heart valves, MVP, or defibrillator?Pt does have CHF, and is uneasy about scheduling.  Would like to talk to her cardiologist before scheduling.    MEDICATIONS & ALLERGIES:    Patient reports the following regarding taking any anticoagulation/antiplatelet therapy:   Plavix, Coumadin, Eliquis, Xarelto, Lovenox, Pradaxa, Brilinta, or Effient? yes (Pt takes Plavix prescribed by Dr. Mariah Milling) Aspirin? yes (81 mg daily)  Patient confirms/reports the following medications:  Current Outpatient Medications  Medication Sig Dispense Refill  . amLODipine (NORVASC) 10 MG tablet Take 1 tablet (10 mg total) by mouth daily. 30 tablet 5  . aspirin EC 81 MG tablet Take 81 mg by mouth daily.    Marland Kitchen atorvastatin (LIPITOR) 80 MG tablet Take 1 tablet (80 mg total) by mouth daily. 30 tablet 5  . carvedilol (COREG) 25 MG tablet Take 1 tablet (25 mg total) by mouth 2 (two) times daily with a meal. 60 tablet 5  . clopidogrel (PLAVIX) 75 MG tablet Take 1 tablet (75 mg total) by mouth daily. 30 tablet 5  . empagliflozin (JARDIANCE) 10 MG TABS tablet Take 1 tablet (10 mg total) by mouth daily. 30 tablet 5  . ezetimibe (ZETIA) 10 MG tablet Take 1 tablet (10 mg total) by mouth daily. 30 tablet 5  . famotidine (PEPCID) 20 MG tablet TAKE 1 TABLET BY MOUTH TWICE A DAY  RELATED TO GASTROESOPHAGEAL REFLUX DISEASE 60 tablet 6  . gabapentin (NEURONTIN) 300 MG capsule TAKE 1 CAPSULE BY MOUTH 3 TIMES A DAY FOR PAIN. DO NOT CRUSH 90 capsule 3  . hydrALAZINE (APRESOLINE) 100 MG tablet Take 1 tablet (100 mg total) by mouth in the morning, at noon, and at bedtime. 90 tablet 5  . levothyroxine (SYNTHROID) 50 MCG tablet Take 50 mcg by mouth daily before breakfast.    . losartan (COZAAR) 50 MG tablet Take 1 tablet (50 mg total) by mouth daily. 90 tablet 1  . Multiple Vitamin (MULTIVITAMIN ADULT PO) Take by mouth daily.    Marland Kitchen oxyCODONE (OXYCONTIN) 10 mg 12 hr tablet Take 10 mg by mouth every 12 (twelve) hours.     . pantoprazole (PROTONIX) 40 MG tablet Take 40 mg by mouth 2 (two) times daily.    . polyethylene glycol (MIRALAX / GLYCOLAX) 17 g packet Take 17 g by mouth daily. 14 each 0  . senna (SENOKOT) 8.6 MG tablet Take 1 tablet by mouth daily.    . Vitamin D, Ergocalciferol, (DRISDOL) 1.25 MG (50000 UNIT) CAPS capsule Take 50,000 Units by mouth once a week.     No current facility-administered medications for this visit.    Patient confirms/reports the following allergies:  No Known Allergies  No orders of the defined types were placed in this encounter.   AUTHORIZATION INFORMATION Primary Insurance: 1D#: Group #:  Secondary Insurance: 1D#: Group #:  SCHEDULE  INFORMATION: Date: Not Scheduled Time: Location:

## 2020-07-14 NOTE — Progress Notes (Signed)
Cardiology Office Note:    Date:  07/15/2020   ID:  Kimberly Mccarthy, DOB 10-06-1955, MRN [id number]  PCP:  Center, [facility]  [facility] Cardiologist:  Julien Nordmann, MD  [facility] Electrophysiologist:  None   Referring MD: Center, [facility]*   Chief Complaint: 3 month follow-up  History of Present Illness:    Kimberly Mccarthy is a 65 y.o. female with a hx of CAD by cath 01/2020 with no intervention at the time, HTN, chronic pain, hypothyroidism, CKD, PVD, CVA, COPD on QHS Bipap, diastolic heart failure, obesity who presents for follow-up.   Patient presented to [facility] 02/12/2020 via EMS with acute on chronic hypoxic respiratory failure due to COPD and diastolic heart failure intubated on admission.  Echo at that time showed EF 50 to 55%, RV normal size and function, pericardial effusion anterior to the right ventricle, mild to moderate AAS without stenosis.  She was extubated and reported chest pain with EKG changes worrisome for ischemia.  Right and left heart cath showed severe two-vessel CAD with diffuse heavy calcification.  Sequential 90% proximal, 50% mid, 60% distal LAD stenosis as well as CTO at the ostium of codominant RCA.  Left circumflex with moderate disease that was diffuse.  She had severely elevated left heart and right heart pulmonary pressures.  She was transferred to [facility] for complex PCI versus CABG.  At [facility] her disease burden was determined to be appropriate for medical management and started on intensive medical therapy.  Was ultimately discharged to SNF.  Last seen 04/16/20 and was doing well from a cardiac perspective.  Weight was down 3 pounds.  Losartan increased to 50mg  daily.   Today, appears patient has lost about 21lbs. She says she has been drinking a lot water. Also decreased her salt intake. Says she has not increased her activity. Says she is trying to stay away from carbs and fatty foods. Does lean cuisine a lot. She got a call from  cardiac rehab. Patient did not want to start cardiac rehab during the colder months, she would like another referral. Denies chest pain or sob. Feels breathing has improved. She uses a cane, also has a walker. BP is better today. Denies bleeding issues with aspirin and plavix. She lives by herself and is able to take care of herself. Her sister is nearby and helps at times with cleaning and grocery shopping.   Past Medical History:  Diagnosis Date  . Anemia   . AVM (arteriovenous malformation)   . Carotid artery disease (HCC) 10/31/2015  . Chronic respiratory failure (HCC)   . COPD (chronic obstructive pulmonary disease) (HCC)   . Critical ischemia of lower extremity (HCC) 07/18/2014  . H/O blood clots   . Hyperkalemia   . Hypertension   . Hypothyroidism 09/05/2014  . Morbid obesity (HCC)   . PVD (peripheral vascular disease) (HCC)   . Renal failure   . Stroke [facility])    may 2015  . Thyroid disease     Past Surgical History:  Procedure Laterality Date  . FEMORAL ENDARTERECTOMY Right   . left foot surgery  02/2014  . LEG SURGERY Left   . RIGHT/LEFT HEART CATH AND CORONARY ANGIOGRAPHY N/A 02/16/2020   Procedure: RIGHT/LEFT HEART CATH AND CORONARY ANGIOGRAPHY;  Surgeon: 02/18/2020, MD;  Location: [facility] INVASIVE CV LAB;  Service: Cardiovascular;  Laterality: N/A;    Current Medications: Current Meds  Medication Sig  . amLODipine (NORVASC) 10 MG tablet Take 1 tablet (10  mg total) by mouth daily.  Marland Kitchen aspirin EC 81 MG tablet Take 81 mg by mouth daily.  Marland Kitchen atorvastatin (LIPITOR) 80 MG tablet Take 1 tablet (80 mg total) by mouth daily.  . carvedilol (COREG) 25 MG tablet Take 1 tablet (25 mg total) by mouth 2 (two) times daily with a meal.  . clopidogrel (PLAVIX) 75 MG tablet Take 1 tablet (75 mg total) by mouth daily.  . empagliflozin (JARDIANCE) 10 MG TABS tablet Take 1 tablet (10 mg total) by mouth daily.  Marland Kitchen ezetimibe (ZETIA) 10 MG tablet Take 1 tablet (10 mg total) by mouth daily.  .  famotidine (PEPCID) 20 MG tablet TAKE 1 TABLET BY MOUTH TWICE A DAY RELATED TO GASTROESOPHAGEAL REFLUX DISEASE  . gabapentin (NEURONTIN) 300 MG capsule TAKE 1 CAPSULE BY MOUTH 3 TIMES A DAY FOR PAIN. DO NOT CRUSH  . hydrALAZINE (APRESOLINE) 100 MG tablet Take 1 tablet (100 mg total) by mouth in the morning, at noon, and at bedtime.  Marland Kitchen levothyroxine (SYNTHROID) 50 MCG tablet Take 50 mcg by mouth daily before breakfast.  . losartan (COZAAR) 50 MG tablet Take 1 tablet (50 mg total) by mouth daily.  . Multiple Vitamin (MULTIVITAMIN ADULT PO) Take by mouth daily.  Marland Kitchen oxyCODONE (OXYCONTIN) 10 mg 12 hr tablet Take 10 mg by mouth every 12 (twelve) hours.   . pantoprazole (PROTONIX) 40 MG tablet Take 40 mg by mouth 2 (two) times daily.  . polyethylene glycol (MIRALAX / GLYCOLAX) 17 g packet Take 17 g by mouth daily.  Marland Kitchen senna (SENOKOT) 8.6 MG tablet Take 1 tablet by mouth daily.  . Vitamin D, Ergocalciferol, (DRISDOL) 1.25 MG (50000 UNIT) CAPS capsule Take 50,000 Units by mouth once a week.     Allergies:   Patient has no known allergies.   Social History   Socioeconomic History  . Marital status: Single    Spouse name: Not on file  . Number of children: Not on file  . Years of education: Not on file  . Highest education level: Not on file  Occupational History  . Not on file  Tobacco Use  . Smoking status: Former Smoker    Packs/day: 0.33    Years: 20.00    Pack years: 6.60    Types: Cigarettes  . Smokeless tobacco: Never Used  Vaping Use  . Vaping Use: Never used  Substance and Sexual Activity  . Alcohol use: No    Alcohol/week: 0.0 standard drinks  . Drug use: No  . Sexual activity: Not on file  Other Topics Concern  . Not on file  Social History Narrative  . Not on file   Social Determinants of Health   Financial Resource Strain: Not on file  Food Insecurity: Not on file  Transportation Needs: Not on file  Physical Activity: Not on file  Stress: Not on file  Social  Connections: Not on file     Family History: The patient's family history includes Diabetes in her mother; Hypertension in her mother and sister; Thyroid disease in her sister. There is no history of Breast cancer.  ROS:   Please see the history of present illness.     All other systems reviewed and are negative.  EKGs/Labs/Other Studies Reviewed:    The following studies were reviewed today:  Cardiac cath 01/2020 Conclusions: 1. Severe two vessel coronary artery disease with diffuse, heavy calcification. There are sequential 90% proximal, 50% mid, and 60% distal LAD stenoses, as well as chronic total occlusion at  the ostium of a codominant RCA. LCx has moderate diffuse disease. 2. Severely elevated left heart, right heart, and pulmonary artery pressures. 3. Low normal to mildly reduced Fick cardiac output/index.  Recommendations: 1. Aggressive diuresis and improved blood pressure control for management of acute HFpEF. 2. Transfer to tertiary care center for CABG versus PCI to LAD with atherectomy. 3. Secondary prevention of coronary artery disease.  Yvonne Kendallhristopher End, MD [facility] Coronary Diagrams   Diagnostic Dominance: Co-dominant    Intervention    Echo 01/2020 1. Left ventricular ejection fraction, by estimation, is 50 to 55%. The  left ventricle has low normal function. Left ventricular endocardial  border not optimally defined to evaluate regional wall motion. Left  ventricular diastolic parameters are  indeterminate.  2. Right ventricular systolic function is normal. The right ventricular  size is normal. Tricuspid regurgitation signal is inadequate for assessing  PA pressure.  3. The pericardial effusion is anterior to the right ventricle.  4. The mitral valve is normal in structure. No evidence of mitral valve  regurgitation. No evidence of mitral stenosis.  5. The aortic valve is normal in structure. Aortic valve regurgitation is  not  visualized. Mild to moderate aortic valve sclerosis/calcification is  present, without any evidence of aortic stenosis.  6. The inferior vena cava is normal in size with <50% respiratory  variability, suggesting right atrial pressure of 8 mmHg.   EKG:  EKG is  ordered today.  The ekg ordered today demonstrates SR, 61bpm, nonspecific T wave changes, no new changes  Recent Labs: 02/12/2020: B Natriuretic Peptide 320.8 02/14/2020: TSH 0.645 02/15/2020: ALT 16 02/16/2020: Magnesium 2.2 03/05/2020: Hemoglobin 12.3; Platelets 349 03/25/2020: BUN 17; Creatinine, Ser 1.22; Potassium 4.1; Sodium 140  Recent Lipid Panel    Component Value Date/Time   CHOL 226 (H) 02/15/2020 0919   CHOL 215 (H) 09/16/2012 0428   TRIG 121 02/16/2020 0443   TRIG 105 09/16/2012 0428   HDL 41 02/15/2020 0919   HDL 56 09/16/2012 0428   CHOLHDL 5.5 02/15/2020 0919   VLDL 38 02/15/2020 0919   VLDL 21 09/16/2012 0428   LDLCALC 147 (H) 02/15/2020 0919   LDLCALC 138 (H) 09/16/2012 0428    Physical Exam:    VS:  BP 120/60 (BP Location: Left Arm, Patient Position: Sitting, Cuff Size: Large)   Pulse 61   Ht 5\' 6"  (1.676 m)   Wt 250 lb 4 oz (113.5 kg)   SpO2 95%   BMI 40.39 kg/m     Wt Readings from Last 3 Encounters:  07/15/20 250 lb 4 oz (113.5 kg)  04/16/20 271 lb (122.9 kg)  03/05/20 274 lb (124.3 kg)     GEN: Well nourished, well developed in no acute distress HEENT: Normal NECK: No JVD; No carotid bruits LYMPHATICS: No lymphadenopathy CARDIAC: RRR, no murmurs, rubs, gallops RESPIRATORY:  Clear to auscultation without rales, wheezing or rhonchi  ABDOMEN: Soft, non-tender, non-distended MUSCULOSKELETAL:  No edema; No deformity  SKIN: Warm and dry NEUROLOGIC:  Alert and oriented x 3 PSYCHIATRIC:  Normal affect   ASSESSMENT:    1. Coronary artery disease of native artery of native heart with stable angina pectoris (HCC)   2. Essential hypertension   3. Chronic diastolic heart failure (HCC)    4. PAD (peripheral artery disease) (HCC)   5. Hyperlipidemia LDL goal <70    PLAN:    In order of problems listed above:  HFpEF Patient is euvolemic on exam. She has been staying away from  salty foods. Echo 10/21 showed EF 50-55%. Continue BB, Jardiance, and ARB.   CAD  LHC 01/2020 showed severe 2V CAD with diffuse calcification (LAD with sequential 90% prox to 5-% mid to 60% distal stenosis and CTO ostium of codominant RCA and LCx with moderate diffuse disease). She was transferred to [facility] for PCI vs CABG however she was recommended for medical management. Patient denies anginal symptoms. SOB is improving with weight loss. Previously referred to cardiac rehab however she wanted to wait. I will provide another referral to cardiac rehab. She has made diet changes and lost weight. Lifestyle changes encouraged. Continue Aspirin, plavix, BB, statin. If she has anginal symptoms consider PCI at [facility] vs CTTS for CABG.   HTN BP good today. Continue amlodipine 10mg  daily, hydralazine 100mg  TID, Coreg 25mg  BID, Losartan 50mg  daily.   HLD Fasting lipid panel, LFTS today. Continue statin and zetia  COPD Stable, no signs of acute exacerbation. Followed by PCP.  CKD Stable labs in November 2021  PAD/hx of CVA Followed by Vascular surgery at [facility]. Denies worsening claudication symptoms.   Disposition: Follow up in 3 month(s) with MD/APP     Signed, Klyn Kroening , PA-C  07/15/2020 3:44 PM    [facility]

## 2020-07-15 ENCOUNTER — Other Ambulatory Visit: Payer: Self-pay

## 2020-07-15 ENCOUNTER — Encounter: Payer: Self-pay | Admitting: Medical

## 2020-07-15 ENCOUNTER — Telehealth: Payer: Self-pay | Admitting: Medical

## 2020-07-15 ENCOUNTER — Ambulatory Visit (INDEPENDENT_AMBULATORY_CARE_PROVIDER_SITE_OTHER): Payer: Medicare Other | Admitting: Medical

## 2020-07-15 VITALS — BP 120/60 | HR 61 | Ht 66.0 in | Wt 250.2 lb

## 2020-07-15 DIAGNOSIS — I10 Essential (primary) hypertension: Secondary | ICD-10-CM

## 2020-07-15 DIAGNOSIS — E785 Hyperlipidemia, unspecified: Secondary | ICD-10-CM

## 2020-07-15 DIAGNOSIS — I739 Peripheral vascular disease, unspecified: Secondary | ICD-10-CM | POA: Diagnosis not present

## 2020-07-15 DIAGNOSIS — I5032 Chronic diastolic (congestive) heart failure: Secondary | ICD-10-CM | POA: Diagnosis not present

## 2020-07-15 DIAGNOSIS — I25118 Atherosclerotic heart disease of native coronary artery with other forms of angina pectoris: Secondary | ICD-10-CM | POA: Diagnosis not present

## 2020-07-15 NOTE — Telephone Encounter (Signed)
LAB WAS ADDED ON TO TODAY'S LAB DRAW.

## 2020-07-15 NOTE — Telephone Encounter (Signed)
[facility] calling States that patient had labs in our office today and they want to add on a renal function panel  Any questions, please call Carlotta at [phone]

## 2020-07-15 NOTE — Telephone Encounter (Signed)
Okay to add on BMP if possible by lab.   Alver Sorrow, NP

## 2020-07-15 NOTE — Telephone Encounter (Signed)
Spoke to [place] in lab. Order entered and she will make sure it is added on.

## 2020-07-15 NOTE — Patient Instructions (Signed)
Medication Instructions:  No changes  *If you need a refill on your cardiac medications before your next appointment, please call your pharmacy*   Lab Work: Lipid & Liver to be done today  If you have labs (blood work) drawn today and your tests are completely normal, you will receive your results only by: Marland Kitchen MyChart Message (if you have MyChart) OR . A paper copy in the mail If you have any lab test that is abnormal or we need to change your treatment, we will call you to review the results.   Testing/Procedures: none   Follow-Up: At [facility], you and your health needs are our priority.  As part of our continuing mission to provide you with exceptional heart care, we have created designated Provider Care Teams.  These Care Teams include your primary Cardiologist (physician) and Advanced Practice Providers (APPs -  Physician Assistants and Nurse Practitioners) who all work together to provide you with the care you need, when you need it.  We recommend signing up for the patient portal called "MyChart".  Sign up information is provided on this After Visit Summary.  MyChart is used to connect with patients for Virtual Visits (Telemedicine).  Patients are able to view lab/test results, encounter notes, upcoming appointments, etc.  Non-urgent messages can be sent to your provider as well.   To learn more about what you can do with MyChart, go to [email or web address].    Your next appointment:   3 month(s)  The format for your next appointment:   In Person  Provider:   Julien Nordmann, MD, Cadence Fransico Michael, PA-C or Gillian Shields, NP   Other Instructions Order placed for cardiac rehab. They will call you in roughly 2 weeks to set this up once they process your insurance. Their number is [phone].

## 2020-07-15 NOTE — Telephone Encounter (Signed)
Would it be ok to see if we can add this on?

## 2020-07-16 ENCOUNTER — Other Ambulatory Visit: Payer: Self-pay | Admitting: Family Medicine

## 2020-07-16 DIAGNOSIS — Z1231 Encounter for screening mammogram for malignant neoplasm of breast: Secondary | ICD-10-CM

## 2020-07-16 LAB — LIPID PANEL
Chol/HDL Ratio: 3.2 ratio (ref 0.0–4.4)
Cholesterol, Total: 152 mg/dL (ref 100–199)
HDL: 47 mg/dL (ref >39)
LDL Chol Calc (NIH): 90 mg/dL (ref 0–99)
Triglycerides: 77 mg/dL (ref 0–149)
VLDL Cholesterol Cal: 15 mg/dL (ref 5–40)

## 2020-07-16 LAB — HEPATIC FUNCTION PANEL
ALT: 10 IU/L (ref 0–32)
AST: 11 IU/L (ref 0–40)
Albumin: 4.6 g/dL (ref 3.8–4.8)
Alkaline Phosphatase: 94 IU/L (ref 44–121)
Bilirubin Total: 0.5 mg/dL (ref 0.0–1.2)
Bilirubin, Direct: 0.14 mg/dL (ref 0.00–0.40)
Total Protein: 7.5 g/dL (ref 6.0–8.5)

## 2020-07-18 ENCOUNTER — Telehealth: Payer: Self-pay

## 2020-07-18 NOTE — Telephone Encounter (Signed)
Attempted to reach pt via phone, was not available, pt sister answer, no DPR to speak with sister or anyone. Asked if pt could call [facility] at her convenience.

## 2020-07-19 ENCOUNTER — Other Ambulatory Visit: Payer: Self-pay

## 2020-07-19 ENCOUNTER — Encounter: Payer: Medicare Other | Attending: Cardiovascular Disease | Admitting: *Deleted

## 2020-07-19 DIAGNOSIS — I5032 Chronic diastolic (congestive) heart failure: Secondary | ICD-10-CM | POA: Insufficient documentation

## 2020-07-19 NOTE — Progress Notes (Signed)
Initial telephone orientations completed. Diagnosis can be found in [facility] 3/21. EP orientation scheduled for 3/30 at 1:30.

## 2020-07-23 LAB — BASIC METABOLIC PANEL
BUN/Creatinine Ratio: 14 (ref 12–28)
BUN: 19 mg/dL (ref 8–27)
CO2: 22 mmol/L (ref 20–29)
Calcium: 9.5 mg/dL (ref 8.7–10.3)
Chloride: 99 mmol/L (ref 96–106)
Creatinine, Ser: 1.4 mg/dL — ABNORMAL HIGH (ref 0.57–1.00)
Glucose: 93 mg/dL (ref 65–99)
Potassium: 4.9 mmol/L (ref 3.5–5.2)
Sodium: 139 mmol/L (ref 134–144)
eGFR: 42 mL/min/{1.73_m2} — ABNORMAL LOW (ref >59)

## 2020-07-24 ENCOUNTER — Other Ambulatory Visit: Payer: Self-pay

## 2020-07-24 VITALS — Ht 66.0 in | Wt 258.0 lb

## 2020-07-24 DIAGNOSIS — I5032 Chronic diastolic (congestive) heart failure: Secondary | ICD-10-CM | POA: Diagnosis present

## 2020-07-24 NOTE — Patient Instructions (Signed)
Patient Instructions  Patient Details  Name: Kimberly Mccarthy MRN: [id number] Date of Birth: 10-08-1955 Referring Provider:  Antonieta Iba, MD  Below are your personal goals for exercise, nutrition, and risk factors. Our goal is to help you stay on track towards obtaining and maintaining these goals. We will be discussing your progress on these goals with you throughout the program.  Initial Exercise Prescription:  Initial Exercise Prescription - 07/24/20 1600      Date of Initial Exercise RX and Referring Provider   Date 07/24/20    Referring Provider Gollan      Treadmill   MPH 1    Grade 0    Minutes 15    METs 1      Recumbant Bike   Level 1    RPM 60    Minutes 15    METs 1      NuStep   Level 1    SPM 80    Minutes 15    METs 1      Arm Ergometer   Level 1    RPM 25    Minutes 15    METs 1      Recumbant Elliptical   Level 1    RPM 50    Minutes 15    METs 1      Biostep-RELP   Level 1    SPM 50    Minutes 15    METs 1      Prescription Details   Frequency (times per week) 3    Duration Progress to 30 minutes of continuous aerobic without signs/symptoms of physical distress      Intensity   THRR 40-80% of Max Heartrate 99-137    Ratings of Perceived Exertion 11-13    Perceived Dyspnea 0-4      Resistance Training   Training Prescription Yes    Weight 3 lb    Reps 10-15           Exercise Goals: Frequency: Be able to perform aerobic exercise two to three times per week in program working toward 2-5 days per week of home exercise.  Intensity: Work with a perceived exertion of 11 (fairly light) - 15 (hard) while following your exercise prescription.  We will make changes to your prescription with you as you progress through the program.   Duration: Be able to do 30 to 45 minutes of continuous aerobic exercise in addition to a 5 minute warm-up and a 5 minute cool-down routine.   Nutrition Goals: Your personal nutrition goals will be  established when you do your nutrition analysis with the dietician.  The following are general nutrition guidelines to follow: Cholesterol < 200mg /day Sodium < 1500mg /day Fiber: Women over 50 yrs - 21 grams per day  Personal Goals:  Personal Goals and Risk Factors at Admission - 07/24/20 1630      Core Components/Risk Factors/Patient Goals on Admission    Weight Management Yes;Weight Loss    Intervention Weight Management: Develop a combined nutrition and exercise program designed to reach desired caloric intake, while maintaining appropriate intake of nutrient and fiber, sodium and fats, and appropriate energy expenditure required for the weight goal.;Weight Management/Obesity: Establish reasonable short term and long term weight goals.    Admit Weight 258 lb (117 kg)    Goal Weight: Short Term 250 lb (113.4 kg)    Goal Weight: Long Term 240 lb (108.9 kg)    Expected Outcomes Short Term: Continue to assess and  modify interventions until short term weight is achieved;Long Term: Adherence to nutrition and physical activity/exercise program aimed toward attainment of established weight goal;Weight Maintenance: Understanding of the daily nutrition guidelines, which includes 25-35% calories from fat, 7% or less cal from saturated fats, less than 200mg  cholesterol, less than 1.5gm of sodium, & 5 or more servings of fruits and vegetables daily;Weight Loss: Understanding of general recommendations for a balanced deficit meal plan, which promotes 1-2 lb weight loss per week and includes a negative energy balance of [phone] kcal/d;Understanding recommendations for meals to include 15-35% energy as protein, 25-35% energy from fat, 35-60% energy from carbohydrates, less than 200mg  of dietary cholesterol, 20-35 gm of total fiber daily;Understanding of distribution of calorie intake throughout the day with the consumption of 4-5 meals/snacks    Heart Failure Yes    Intervention Provide a combined exercise and  nutrition program that is supplemented with education, support and counseling about heart failure. Directed toward relieving symptoms such as shortness of breath, decreased exercise tolerance, and extremity edema.    Expected Outcomes Improve functional capacity of life;Short term: Attendance in program 2-3 days a week with increased exercise capacity. Reported lower sodium intake. Reported increased fruit and vegetable intake. Reports medication compliance.;Short term: Daily weights obtained and reported for increase. Utilizing diuretic protocols set by physician.;Long term: Adoption of self-care skills and reduction of barriers for early signs and symptoms recognition and intervention leading to self-care maintenance.    Hypertension Yes    Intervention Provide education on lifestyle modifcations including regular physical activity/exercise, weight management, moderate sodium restriction and increased consumption of fresh fruit, vegetables, and low fat dairy, alcohol moderation, and smoking cessation.;Monitor prescription use compliance.    Expected Outcomes Short Term: Continued assessment and intervention until BP is < 140/37mm HG in hypertensive participants. < 130/65mm HG in hypertensive participants with diabetes, heart failure or chronic kidney disease.;Long Term: Maintenance of blood pressure at goal levels.           Tobacco Use Initial Evaluation: Social History   Tobacco Use  Smoking Status Former Smoker  . Packs/day: 0.33  . Years: 20.00  . Pack years: 6.60  . Types: Cigarettes  Smokeless Tobacco Never Used    Exercise Goals and Review:  Exercise Goals    Row Name 07/24/20 1625             Exercise Goals   Increase Physical Activity Yes       Intervention Provide advice, education, support and counseling about physical activity/exercise needs.;Develop an individualized exercise prescription for aerobic and resistive training based on initial evaluation findings, risk  stratification, comorbidities and participant's personal goals.       Expected Outcomes Short Term: Attend rehab on a regular basis to increase amount of physical activity.;Long Term: Add in home exercise to make exercise part of routine and to increase amount of physical activity.;Long Term: Exercising regularly at least 3-5 days a week.       Increase Strength and Stamina Yes       Intervention Provide advice, education, support and counseling about physical activity/exercise needs.;Develop an individualized exercise prescription for aerobic and resistive training based on initial evaluation findings, risk stratification, comorbidities and participant's personal goals.       Expected Outcomes Short Term: Increase workloads from initial exercise prescription for resistance, speed, and METs.;Short Term: Perform resistance training exercises routinely during rehab and add in resistance training at home;Long Term: Improve cardiorespiratory fitness, muscular endurance and strength as measured by increased  METs and functional capacity ( )       Able to understand and use rate of perceived exertion (RPE) scale Yes       Intervention Provide education and explanation on how to use RPE scale       Expected Outcomes Short Term: Able to use RPE daily in rehab to express subjective intensity level;Long Term:  Able to use RPE to guide intensity level when exercising independently       Able to understand and use Dyspnea scale Yes       Intervention Provide education and explanation on how to use Dyspnea scale       Expected Outcomes Short Term: Able to use Dyspnea scale daily in rehab to express subjective sense of shortness of breath during exertion;Long Term: Able to use Dyspnea scale to guide intensity level when exercising independently       Knowledge and understanding of Target Heart Rate Range (THRR) Yes       Intervention Provide education and explanation of THRR including how the numbers were predicted  and where they are located for reference       Expected Outcomes Short Term: Able to state/look up THRR;Short Term: Able to use daily as guideline for intensity in rehab;Long Term: Able to use THRR to govern intensity when exercising independently       Able to check pulse independently Yes       Intervention Provide education and demonstration on how to check pulse in carotid and radial arteries.;Review the importance of being able to check your own pulse for safety during independent exercise       Expected Outcomes Short Term: Able to explain why pulse checking is important during independent exercise;Long Term: Able to check pulse independently and accurately       Understanding of Exercise Prescription Yes       Intervention Provide education, explanation, and written materials on patient's individual exercise prescription       Expected Outcomes Short Term: Able to explain program exercise prescription;Long Term: Able to explain home exercise prescription to exercise independently              Copy of goals given to participant.

## 2020-07-24 NOTE — Progress Notes (Signed)
Pulmonary Individual Treatment Plan  Patient Details  Name: Kimberly Mccarthy MRN: [id number] Date of Birth: 1956-01-31 Referring Provider:   Flowsheet Row Pulmonary Rehab from 07/24/2020 in [facility] Cardiac and Pulmonary Rehab  Referring Provider Gollan      Initial Encounter Date:  Flowsheet Row Pulmonary Rehab from 07/24/2020 in [facility] Cardiac and Pulmonary Rehab  Date 07/24/20      Visit Diagnosis: Heart failure, diastolic, chronic (HCC)  Patient's Home Medications on Admission:  Current Outpatient Medications:  .  amLODipine (NORVASC) 10 MG tablet, Take 1 tablet (10 mg total) by mouth daily., Disp: 30 tablet, Rfl: 5 .  aspirin EC 81 MG tablet, Take 81 mg by mouth daily., Disp: , Rfl:  .  atorvastatin (LIPITOR) 80 MG tablet, Take 1 tablet (80 mg total) by mouth daily., Disp: 30 tablet, Rfl: 5 .  carvedilol (COREG) 25 MG tablet, Take 1 tablet (25 mg total) by mouth 2 (two) times daily with a meal., Disp: 60 tablet, Rfl: 5 .  clopidogrel (PLAVIX) 75 MG tablet, Take 1 tablet (75 mg total) by mouth daily., Disp: 30 tablet, Rfl: 5 .  empagliflozin (JARDIANCE) 10 MG TABS tablet, Take 1 tablet (10 mg total) by mouth daily., Disp: 30 tablet, Rfl: 5 .  ezetimibe (ZETIA) 10 MG tablet, Take 1 tablet (10 mg total) by mouth daily., Disp: 30 tablet, Rfl: 5 .  famotidine (PEPCID) 20 MG tablet, TAKE 1 TABLET BY MOUTH TWICE A DAY RELATED TO GASTROESOPHAGEAL REFLUX DISEASE, Disp: 60 tablet, Rfl: 6 .  gabapentin (NEURONTIN) 300 MG capsule, TAKE 1 CAPSULE BY MOUTH 3 TIMES A DAY FOR PAIN. DO NOT CRUSH, Disp: 90 capsule, Rfl: 3 .  hydrALAZINE (APRESOLINE) 100 MG tablet, Take 1 tablet (100 mg total) by mouth in the morning, at noon, and at bedtime., Disp: 90 tablet, Rfl: 5 .  levothyroxine (SYNTHROID) 50 MCG tablet, Take 50 mcg by mouth daily before breakfast., Disp: , Rfl:  .  losartan (COZAAR) 50 MG tablet, Take 1 tablet (50 mg total) by mouth daily., Disp: 90 tablet, Rfl: 1 .  Multiple Vitamin (MULTIVITAMIN  ADULT PO), Take by mouth daily., Disp: , Rfl:  .  oxyCODONE (OXYCONTIN) 10 mg 12 hr tablet, Take 10 mg by mouth every 12 (twelve) hours. , Disp: , Rfl:  .  pantoprazole (PROTONIX) 40 MG tablet, Take 40 mg by mouth 2 (two) times daily., Disp: , Rfl:  .  polyethylene glycol (MIRALAX / GLYCOLAX) 17 g packet, Take 17 g by mouth daily., Disp: 14 each, Rfl: 0 .  senna (SENOKOT) 8.6 MG tablet, Take 1 tablet by mouth daily., Disp: , Rfl:  .  Vitamin D, Ergocalciferol, (DRISDOL) 1.25 MG (50000 UNIT) CAPS capsule, Take 50,000 Units by mouth once a week., Disp: , Rfl:   Past Medical History: Past Medical History:  Diagnosis Date  . Anemia   . AVM (arteriovenous malformation)   . Carotid artery disease (HCC) 10/31/2015  . Chronic respiratory failure (HCC)   . COPD (chronic obstructive pulmonary disease) (HCC)   . Critical ischemia of lower extremity (HCC) 07/18/2014  . H/O blood clots   . Hyperkalemia   . Hypertension   . Hypothyroidism 09/05/2014  . Morbid obesity (HCC)   . PVD (peripheral vascular disease) (HCC)   . Renal failure   . Stroke [facility])    may 2015  . Thyroid disease     Tobacco Use: Social History   Tobacco Use  Smoking Status Former Smoker  . Packs/day: 0.33  .  Years: 20.00  . Pack years: 6.60  . Types: Cigarettes  Smokeless Tobacco Never Used    Labs: Recent Review Flowsheet Data    Labs for ITP Cardiac and Pulmonary Rehab Latest Ref Rng & Units 02/14/2020 02/15/2020 02/15/2020 02/16/2020 07/15/2020   Cholestrol 100 - 199 mg/dL - - 161(W) - 960   LDLCALC 0 - 99 mg/dL - - 454(U) - 90   HDL >98 mg/dL - - 41 - 47   Trlycerides 0 - 149 mg/dL 119 147(W) 295(A) 213 77   Hemoglobin A1c 4.8 - 5.6 % - - - - -   PHART 7.350 - 7.450 - - - - -   PCO2ART 32.0 - 48.0 mmHg - - - - -   HCO3 20.0 - 28.0 mmol/L - - - - -   ACIDBASEDEF 0.0 - 2.0 mmol/L - - - - -   O2SAT % - - - - -       Pulmonary Assessment Scores:  Pulmonary Assessment Scores    Row Name 07/24/20 1626          ADL UCSD   SOB Score total 14     Rest 0     Walk 1     Stairs 2     Bath 0     Dress 0     Shop 0           CAT Score   CAT Score 6            UCSD: Self-administered rating of dyspnea associated with activities of daily living (ADLs) 6-point scale (0 = "not at all" to 5 = "maximal or unable to do because of breathlessness")  Scoring Scores range from 0 to 120.  Minimally important difference is 5 units  CAT: CAT can identify the health impairment of COPD patients and is better correlated with disease progression.  CAT has a scoring range of zero to 40. The CAT score is classified into four groups of low (less than 10), medium (10 - 20), high (21-30) and very high (31-40) based on the impact level of disease on health status. A CAT score over 10 suggests significant symptoms.  A worsening CAT score could be explained by an exacerbation, poor medication adherence, poor inhaler technique, or progression of COPD or comorbid conditions.  CAT MCID is 2 points  mMRC: mMRC (Modified Medical Research Council) Dyspnea Scale is used to assess the degree of baseline functional disability in patients of respiratory disease due to dyspnea. No minimal important difference is established. A decrease in score of 1 point or greater is considered a positive change.   Pulmonary Function Assessment:   Exercise Target Goals: Exercise Program Goal: Individual exercise prescription set using results from initial 6 min walk test and THRR while considering  patient's activity barriers and safety.   Exercise Prescription Goal: Initial exercise prescription builds to 30-45 minutes a day of aerobic activity, 2-3 days per week.  Home exercise guidelines will be given to patient during program as part of exercise prescription that the participant will acknowledge.  Education: Aerobic Exercise: - Group verbal and visual presentation on the components of exercise prescription. Introduces F.I.T.T  principle from ACSM for exercise prescriptions.  Reviews F.I.T.T. principles of aerobic exercise including progression. Written material given at graduation.   Education: Resistance Exercise: - Group verbal and visual presentation on the components of exercise prescription. Introduces F.I.T.T principle from ACSM for exercise prescriptions  Reviews F.I.T.T. principles of resistance exercise including  progression. Written material given at graduation.    Education: Exercise & Equipment Safety: - Individual verbal instruction and demonstration of equipment use and safety with use of the equipment. Flowsheet Row Pulmonary Rehab from 07/24/2020 in [facility] Cardiac and Pulmonary Rehab  Date 07/24/20  Educator AS  Instruction Review Code 1- Verbalizes Understanding      Education: Exercise Physiology & General Exercise Guidelines: - Group verbal and written instruction with models to review the exercise physiology of the cardiovascular system and associated critical values. Provides general exercise guidelines with specific guidelines to those with heart or lung disease.    Education: Flexibility, Balance, Mind/Body Relaxation: - Group verbal and visual presentation with interactive activity on the components of exercise prescription. Introduces F.I.T.T principle from ACSM for exercise prescriptions. Reviews F.I.T.T. principles of flexibility and balance exercise training including progression. Also discusses the mind body connection.  Reviews various relaxation techniques to help reduce and manage stress (i.e. Deep breathing, progressive muscle relaxation, and visualization). Balance handout provided to take home. Written material given at graduation.   Activity Barriers & Risk Stratification:  Activity Barriers & Cardiac Risk Stratification - 07/19/20 1003      Activity Barriers & Cardiac Risk Stratification   Activity Barriers Arthritis;Joint Problems;Muscular Weakness;Balance Concerns;Assistive  Device   cane and walker          6 Minute Walk:  6 Minute Walk    Row Name 07/24/20 1615         6 Minute Walk   Distance 415 feet     Walk Time 4.5 minutes     # of Rest Breaks 3     MPH 1.04     METS 0.97     RPE 15     Perceived Dyspnea  1     VO2 Peak 3.4     Symptoms Yes (comment)     Comments legs hurt 10/10     Resting HR 61 bpm     Resting BP 152/78     Resting Oxygen Saturation  95 %     Exercise Oxygen Saturation  during 6 min walk 88 %     Max Ex. HR 91 bpm     Max Ex. BP 160/68     2 Minute Post BP 144/66           Interval HR   1 Minute HR 87     2 Minute HR 84     3 Minute HR 88     4 Minute HR 91     5 Minute HR 85     6 Minute HR 91     2 Minute Post HR 72     Interval Heart Rate? Yes           Interval Oxygen   Interval Oxygen? Yes     Baseline Oxygen Saturation % 95 %     1 Minute Oxygen Saturation % 90 %     1 Minute Liters of Oxygen 0 L     2 Minute Oxygen Saturation % 90 %     2 Minute Liters of Oxygen 0 L     3 Minute Oxygen Saturation % 91 %     3 Minute Liters of Oxygen 0 L     4 Minute Oxygen Saturation % 91 %     4 Minute Liters of Oxygen 0 L     5 Minute Oxygen Saturation % 89 %     5 Minute Liters  of Oxygen 0 L     6 Minute Oxygen Saturation % 88 %     6 Minute Liters of Oxygen 0 L     2 Minute Post Oxygen Saturation % 95 %     2 Minute Post Liters of Oxygen 0 L           Oxygen Initial Assessment:  Oxygen Initial Assessment - 07/19/20 1004      Home Oxygen   Home Oxygen Device None    Sleep Oxygen Prescription None    Home Exercise Oxygen Prescription None    Home Resting Oxygen Prescription None      Initial 6 min Walk   Oxygen Used None      Program Oxygen Prescription   Program Oxygen Prescription None      Intervention   Short Term Goals To learn and understand importance of maintaining oxygen saturations>88%;To learn and understand importance of monitoring SPO2 with pulse oximeter and demonstrate  accurate use of the pulse oximeter.;To learn and demonstrate proper pursed lip breathing techniques or other breathing techniques.;To learn and demonstrate proper use of respiratory medications    Long  Term Goals Verbalizes importance of monitoring SPO2 with pulse oximeter and return demonstration;Maintenance of O2 saturations>88%;Exhibits proper breathing techniques, such as pursed lip breathing or other method taught during program session;Compliance with respiratory medication;Demonstrates proper use of MDI's           Oxygen Re-Evaluation:   Oxygen Discharge (Final Oxygen Re-Evaluation):   Initial Exercise Prescription:  Initial Exercise Prescription - 07/24/20 1600      Date of Initial Exercise RX and Referring Provider   Date 07/24/20    Referring Provider Gollan      Treadmill   MPH 1    Grade 0    Minutes 15    METs 1      Recumbant Bike   Level 1    RPM 60    Minutes 15    METs 1      NuStep   Level 1    SPM 80    Minutes 15    METs 1      Arm Ergometer   Level 1    RPM 25    Minutes 15    METs 1      Recumbant Elliptical   Level 1    RPM 50    Minutes 15    METs 1      Biostep-RELP   Level 1    SPM 50    Minutes 15    METs 1      Prescription Details   Frequency (times per week) 3    Duration Progress to 30 minutes of continuous aerobic without signs/symptoms of physical distress      Intensity   THRR 40-80% of Max Heartrate 99-137    Ratings of Perceived Exertion 11-13    Perceived Dyspnea 0-4      Resistance Training   Training Prescription Yes    Weight 3 lb    Reps 10-15           Perform Capillary Blood Glucose checks as needed.  Exercise Prescription Changes:  Exercise Prescription Changes    Row Name 07/24/20 1600             Response to Exercise   Blood Pressure (Admit) 152/78       Blood Pressure (Exercise) 160/68       Blood Pressure (Exit) 144/66  Heart Rate (Admit) 61 bpm       Heart Rate (Exercise)  91 bpm       Heart Rate (Exit) 72 bpm       Oxygen Saturation (Admit) 95 %       Oxygen Saturation (Exercise) 88 %       Oxygen Saturation (Exit) 95 %       Rating of Perceived Exertion (Exercise) 15       Perceived Dyspnea (Exercise) 1       Symptoms hips hurt              Exercise Comments:   Exercise Goals and Review:  Exercise Goals    Row Name 07/24/20 1625             Exercise Goals   Increase Physical Activity Yes       Intervention Provide advice, education, support and counseling about physical activity/exercise needs.;Develop an individualized exercise prescription for aerobic and resistive training based on initial evaluation findings, risk stratification, comorbidities and participant's personal goals.       Expected Outcomes Short Term: Attend rehab on a regular basis to increase amount of physical activity.;Long Term: Add in home exercise to make exercise part of routine and to increase amount of physical activity.;Long Term: Exercising regularly at least 3-5 days a week.       Increase Strength and Stamina Yes       Intervention Provide advice, education, support and counseling about physical activity/exercise needs.;Develop an individualized exercise prescription for aerobic and resistive training based on initial evaluation findings, risk stratification, comorbidities and participant's personal goals.       Expected Outcomes Short Term: Increase workloads from initial exercise prescription for resistance, speed, and METs.;Short Term: Perform resistance training exercises routinely during rehab and add in resistance training at home;Long Term: Improve cardiorespiratory fitness, muscular endurance and strength as measured by increased METs and functional capacity ( )       Able to understand and use rate of perceived exertion (RPE) scale Yes       Intervention Provide education and explanation on how to use RPE scale       Expected Outcomes Short Term: Able to use  RPE daily in rehab to express subjective intensity level;Long Term:  Able to use RPE to guide intensity level when exercising independently       Able to understand and use Dyspnea scale Yes       Intervention Provide education and explanation on how to use Dyspnea scale       Expected Outcomes Short Term: Able to use Dyspnea scale daily in rehab to express subjective sense of shortness of breath during exertion;Long Term: Able to use Dyspnea scale to guide intensity level when exercising independently       Knowledge and understanding of Target Heart Rate Range (THRR) Yes       Intervention Provide education and explanation of THRR including how the numbers were predicted and where they are located for reference       Expected Outcomes Short Term: Able to state/look up THRR;Short Term: Able to use daily as guideline for intensity in rehab;Long Term: Able to use THRR to govern intensity when exercising independently       Able to check pulse independently Yes       Intervention Provide education and demonstration on how to check pulse in carotid and radial arteries.;Review the importance of being able to check your own pulse for safety during  independent exercise       Expected Outcomes Short Term: Able to explain why pulse checking is important during independent exercise;Long Term: Able to check pulse independently and accurately       Understanding of Exercise Prescription Yes       Intervention Provide education, explanation, and written materials on patient's individual exercise prescription       Expected Outcomes Short Term: Able to explain program exercise prescription;Long Term: Able to explain home exercise prescription to exercise independently              Exercise Goals Re-Evaluation :   Discharge Exercise Prescription (Final Exercise Prescription Changes):  Exercise Prescription Changes - 07/24/20 1600      Response to Exercise   Blood Pressure (Admit) 152/78    Blood Pressure  (Exercise) 160/68    Blood Pressure (Exit) 144/66    Heart Rate (Admit) 61 bpm    Heart Rate (Exercise) 91 bpm    Heart Rate (Exit) 72 bpm    Oxygen Saturation (Admit) 95 %    Oxygen Saturation (Exercise) 88 %    Oxygen Saturation (Exit) 95 %    Rating of Perceived Exertion (Exercise) 15    Perceived Dyspnea (Exercise) 1    Symptoms hips hurt           Nutrition:  Target Goals: Understanding of nutrition guidelines, daily intake of sodium 1500mg , cholesterol 200mg , calories 30% from fat and 7% or less from saturated fats, daily to have 5 or more servings of fruits and vegetables.  Education: All About Nutrition: -Group instruction provided by verbal, written material, interactive activities, discussions, models, and posters to present general guidelines for heart healthy nutrition including fat, fiber, MyPlate, the role of sodium in heart healthy nutrition, utilization of the nutrition label, and utilization of this knowledge for meal planning. Follow up email sent as well. Written material given at graduation.   Biometrics:  Pre Biometrics - 07/24/20 1625      Pre Biometrics   Height 5\' 6"  (1.676 m)    Weight 258 lb (117 kg)    BMI (Calculated) 41.66            Nutrition Therapy Plan and Nutrition Goals:   Nutrition Assessments:  MEDIFICTS Score Key:  ?70 Need to make dietary changes   40-70 Heart Healthy Diet  ? 40 Therapeutic Level Cholesterol Diet  Flowsheet Row Pulmonary Rehab from 07/24/2020 in [facility] Cardiac and Pulmonary Rehab  Picture Your Plate Total Score on Admission 58     Picture Your Plate Scores:  [facility] Unhealthy dietary pattern with much room for improvement.  41-50 Dietary pattern unlikely to meet recommendations for good health and room for improvement.  51-60 More healthful dietary pattern, with some room for improvement.   >60 Healthy dietary pattern, although there may be some specific behaviors that could be improved.   Nutrition  Goals Re-Evaluation:   Nutrition Goals Discharge (Final Nutrition Goals Re-Evaluation):   Psychosocial: Target Goals: Acknowledge presence or absence of significant depression and/or stress, maximize coping skills, provide positive support system. Participant is able to verbalize types and ability to use techniques and skills needed for reducing stress and depression.   Education: Stress, Anxiety, and Depression - Group verbal and visual presentation to define topics covered.  Reviews how body is impacted by stress, anxiety, and depression.  Also discusses healthy ways to reduce stress and to treat/manage anxiety and depression.  Written material given at graduation.   Education: Sleep Hygiene -  Provides group verbal and written instruction about how sleep can affect your health.  Define sleep hygiene, discuss sleep cycles and impact of sleep habits. Review good sleep hygiene tips.    Initial Review & Psychosocial Screening:  Initial Psych Review & Screening - 07/19/20 1006      Initial Review   Current issues with Current Stress Concerns    Source of Stress Concerns Unable to participate in former interests or hobbies;Unable to perform yard/household activities;Chronic Illness      Family Dynamics   Good Support System? Yes   sister; family     Barriers   Psychosocial barriers to participate in program There are no identifiable barriers or psychosocial needs.;The patient should benefit from training in stress management and relaxation.      Screening Interventions   Interventions Encouraged to exercise;To provide support and resources with identified psychosocial needs;Provide feedback about the scores to participant    Expected Outcomes Short Term goal: Utilizing psychosocial counselor, staff and physician to assist with identification of specific Stressors or current issues interfering with healing process. Setting desired goal for each stressor or current issue identified.;Long  Term Goal: Stressors or current issues are controlled or eliminated.;Short Term goal: Identification and review with participant of any Quality of Life or Depression concerns found by scoring the questionnaire.;Long Term goal: The participant improves quality of Life and PHQ9 Scores as seen by post scores and/or verbalization of changes           Quality of Life Scores:  Scores of 19 and below usually indicate a poorer quality of life in these areas.  A difference of  2-3 points is a clinically meaningful difference.  A difference of 2-3 points in the total score of the Quality of Life Index has been associated with significant improvement in overall quality of life, self-image, physical symptoms, and general health in studies assessing change in quality of life.  PHQ-9: Recent Review Flowsheet Data    Depression screen [facility] 2/9 07/24/2020 08/30/2015 08/01/2015 07/02/2015 06/06/2015   Decreased Interest 0 0 0 0 0   Down, Depressed, Hopeless 0 0 0 0 0   PHQ - 2 Score 0 0 0 0 0   Altered sleeping 0 - - - -   Tired, decreased energy 0 - - - -   Change in appetite 0 - - - -   Feeling bad or failure about yourself  0 - - - -   Trouble concentrating 0 - - - -   Moving slowly or fidgety/restless 0 - - - -   Suicidal thoughts 0 - - - -   PHQ-9 Score 0 - - - -     Interpretation of Total Score  Total Score Depression Severity:  1-4 = Minimal depression, 5-9 = Mild depression, 10-14 = Moderate depression, 15-19 = Moderately severe depression, 20-27 = Severe depression   Psychosocial Evaluation and Intervention:  Psychosocial Evaluation - 07/19/20 1011      Psychosocial Evaluation & Interventions   Interventions Encouraged to exercise with the program and follow exercise prescription    Comments Ms. Kolarik is coming to Pulmonary Rehab with diastolic heart failure. She states her breathing is her main source of stress and she is tired of feeling short of breath. Her family is her main support system  and lives close by to help with things. She did get referred in December but wanted to wait until it got warmer to start pulmonary rehab, in the meantime she had  been working on her diet and has lost some weight which helps her feel better. She didn't report any other stressors and states she just takes it day by day.    Expected Outcomes Short: attend Pulmonary Rehab for education and exercise. Long: develop positive self care habits.    Continue Psychosocial Services  Follow up required by staff           Psychosocial Re-Evaluation:   Psychosocial Discharge (Final Psychosocial Re-Evaluation):   Education: Education Goals: Education classes will be provided on a weekly basis, covering required topics. Participant will state understanding/return demonstration of topics presented.  Learning Barriers/Preferences:  Learning Barriers/Preferences - 07/19/20 1014      Learning Barriers/Preferences   Learning Barriers None    Learning Preferences None           General Pulmonary Education Topics:  Infection Prevention: - Provides verbal and written material to individual with discussion of infection control including proper hand washing and proper equipment cleaning during exercise session. Flowsheet Row Pulmonary Rehab from 07/24/2020 in [facility] Cardiac and Pulmonary Rehab  Date 07/24/20  Educator AS  Instruction Review Code 1- Verbalizes Understanding      Falls Prevention: - Provides verbal and written material to individual with discussion of falls prevention and safety. Flowsheet Row Pulmonary Rehab from 07/24/2020 in [facility] Cardiac and Pulmonary Rehab  Date 07/24/20  Educator AS  Instruction Review Code 1- Verbalizes Understanding      Chronic Lung Disease Review: - Group verbal instruction with posters, models, PowerPoint presentations and videos,  to review new updates, new respiratory medications, new advancements in procedures and treatments. Providing information on  websites and "800" numbers for continued self-education. Includes information about supplement oxygen, available portable oxygen systems, continuous and intermittent flow rates, oxygen safety, concentrators, and Medicare reimbursement for oxygen. Explanation of Pulmonary Drugs, including class, frequency, complications, importance of spacers, rinsing mouth after steroid MDI's, and proper cleaning methods for nebulizers. Review of basic lung anatomy and physiology related to function, structure, and complications of lung disease. Review of risk factors. Discussion about methods for diagnosing sleep apnea and types of masks and machines for OSA. Includes a review of the use of types of environmental controls: home humidity, furnaces, filters, dust mite/pet prevention, HEPA vacuums. Discussion about weather changes, air quality and the benefits of nasal washing. Instruction on Warning signs, infection symptoms, calling MD promptly, preventive modes, and value of vaccinations. Review of effective airway clearance, coughing and/or vibration techniques. Emphasizing that all should Create an Action Plan. Written material given at graduation.   AED/CPR: - Group verbal and written instruction with the use of models to demonstrate the basic use of the AED with the basic ABC's of resuscitation.    Anatomy and Cardiac Procedures: - Group verbal and visual presentation and models provide information about basic cardiac anatomy and function. Reviews the testing methods done to diagnose heart disease and the outcomes of the test results. Describes the treatment choices: Medical Management, Angioplasty, or Coronary Bypass Surgery for treating various heart conditions including Myocardial Infarction, Angina, Valve Disease, and Cardiac Arrhythmias.  Written material given at graduation.   Medication Safety: - Group verbal and visual instruction to review commonly prescribed medications for heart and lung disease. Reviews  the medication, class of the drug, and side effects. Includes the steps to properly store meds and maintain the prescription regimen.  Written material given at graduation.   Other: -Provides group and verbal instruction on various topics (see comments)  Knowledge Questionnaire Score:  Knowledge Questionnaire Score - 07/24/20 1628      Knowledge Questionnaire Score   Pre Score 15/18  ADL, oxygen            Core Components/Risk Factors/Patient Goals at Admission:  Personal Goals and Risk Factors at Admission - 07/24/20 1630      Core Components/Risk Factors/Patient Goals on Admission    Weight Management Yes;Weight Loss    Intervention Weight Management: Develop a combined nutrition and exercise program designed to reach desired caloric intake, while maintaining appropriate intake of nutrient and fiber, sodium and fats, and appropriate energy expenditure required for the weight goal.;Weight Management/Obesity: Establish reasonable short term and long term weight goals.    Admit Weight 258 lb (117 kg)    Goal Weight: Short Term 250 lb (113.4 kg)    Goal Weight: Long Term 240 lb (108.9 kg)    Expected Outcomes Short Term: Continue to assess and modify interventions until short term weight is achieved;Long Term: Adherence to nutrition and physical activity/exercise program aimed toward attainment of established weight goal;Weight Maintenance: Understanding of the daily nutrition guidelines, which includes 25-35% calories from fat, 7% or less cal from saturated fats, less than 200mg  cholesterol, less than 1.5gm of sodium, & 5 or more servings of fruits and vegetables daily;Weight Loss: Understanding of general recommendations for a balanced deficit meal plan, which promotes 1-2 lb weight loss per week and includes a negative energy balance of [phone] kcal/d;Understanding recommendations for meals to include 15-35% energy as protein, 25-35% energy from fat, 35-60% energy from carbohydrates,  less than 200mg  of dietary cholesterol, 20-35 gm of total fiber daily;Understanding of distribution of calorie intake throughout the day with the consumption of 4-5 meals/snacks    Heart Failure Yes    Intervention Provide a combined exercise and nutrition program that is supplemented with education, support and counseling about heart failure. Directed toward relieving symptoms such as shortness of breath, decreased exercise tolerance, and extremity edema.    Expected Outcomes Improve functional capacity of life;Short term: Attendance in program 2-3 days a week with increased exercise capacity. Reported lower sodium intake. Reported increased fruit and vegetable intake. Reports medication compliance.;Short term: Daily weights obtained and reported for increase. Utilizing diuretic protocols set by physician.;Long term: Adoption of self-care skills and reduction of barriers for early signs and symptoms recognition and intervention leading to self-care maintenance.    Hypertension Yes    Intervention Provide education on lifestyle modifcations including regular physical activity/exercise, weight management, moderate sodium restriction and increased consumption of fresh fruit, vegetables, and low fat dairy, alcohol moderation, and smoking cessation.;Monitor prescription use compliance.    Expected Outcomes Short Term: Continued assessment and intervention until BP is < 140/6190mm HG in hypertensive participants. < 130/5880mm HG in hypertensive participants with diabetes, heart failure or chronic kidney disease.;Long Term: Maintenance of blood pressure at goal levels.           Education:Diabetes - Individual verbal and written instruction to review signs/symptoms of diabetes, desired ranges of glucose level fasting, after meals and with exercise. Acknowledge that pre and post exercise glucose checks will be done for 3 sessions at entry of program.   Know Your Numbers and Heart Failure: - Group verbal and  visual instruction to discuss disease risk factors for cardiac and pulmonary disease and treatment options.  Reviews associated critical values for Overweight/Obesity, Hypertension, Cholesterol, and Diabetes.  Discusses basics of heart failure: signs/symptoms and treatments.  Introduces Heart Failure Zone chart for  action plan for heart failure.  Written material given at graduation.   Core Components/Risk Factors/Patient Goals Review:    Core Components/Risk Factors/Patient Goals at Discharge (Final Review):    ITP Comments:  ITP Comments    Row Name 07/19/20 1010           ITP Comments Initial telephone orientations completed. Diagnosis can be found in [facility] 3/21. EP orientation scheduled for 3/30 at 1:30.              Comments: initial ITP

## 2020-07-25 ENCOUNTER — Other Ambulatory Visit: Payer: Self-pay | Admitting: Family

## 2020-07-25 NOTE — Telephone Encounter (Signed)
Patient is returning your call.  

## 2020-07-25 NOTE — Telephone Encounter (Signed)
Lmovm to verify pt's medications being requested.

## 2020-07-25 NOTE — Telephone Encounter (Signed)
This RN had attempted to reach out to pt on 3/24 regarding labs.  Pt was notified yesterday of labs by Aundra Millet, Charity fundraiser. From notes: "The patient has been notified of the result and verbalized understanding. All questions (if any) were answered. Lanny Hurst, RN 07/23/2020 1:05 PM"

## 2020-07-31 ENCOUNTER — Other Ambulatory Visit: Payer: Self-pay | Admitting: Medical

## 2020-07-31 NOTE — Telephone Encounter (Signed)
Please advise if ok to refill Aspirin 81 mg tablet. Filled by historical provider.

## 2020-07-31 NOTE — Telephone Encounter (Signed)
Refilled ASA as requested per last OV note.

## 2020-08-02 ENCOUNTER — Other Ambulatory Visit: Payer: Self-pay | Admitting: Family

## 2020-08-02 NOTE — Telephone Encounter (Signed)
Rx request sent to pharmacy.  

## 2020-08-05 ENCOUNTER — Encounter: Payer: Medicare Other | Attending: Cardiovascular Disease | Admitting: *Deleted

## 2020-08-05 ENCOUNTER — Other Ambulatory Visit: Payer: Self-pay

## 2020-08-05 DIAGNOSIS — I5032 Chronic diastolic (congestive) heart failure: Secondary | ICD-10-CM | POA: Insufficient documentation

## 2020-08-05 NOTE — Progress Notes (Signed)
Daily Session Note  Patient Details  Name: Kimberly Mccarthy MRN: [id number] Date of Birth: 09/30/1955 Referring Provider:   Flowsheet Row Pulmonary Rehab from 07/24/2020 in [facility]  Referring Provider Gollan      Encounter Date: 08/05/2020  Check In:  Session Check In - 08/05/20 1144      Check-In   Supervising physician immediately available to respond to emergencies See telemetry face sheet for immediately available ER MD    Location [facility]-Cardiac & Pulmonary Rehab    Staff Present Heath Lark, RN, BSN, CCRP;Joseph Hood RCP,RRT,BSRT;Kelly Bradford, [place], ACSM CEP, Exercise Physiologist    Virtual Visit No    Medication changes reported     No    Fall or balance concerns reported    No    Warm-up and Cool-down Performed on first and last piece of equipment    Resistance Training Performed Yes    VAD Patient? No    PAD/SET Patient? No      Pain Assessment   Currently in Pain? No/denies              Social History   Tobacco Use  Smoking Status Never Smoker  Smokeless Tobacco Never Used    Goals Met:  Independence with exercise equipment Exercise tolerated well No report of cardiac concerns or symptoms Strength training completed today  Goals Unmet:  Not Applicable  Comments: First full day of exercise!  Patient was oriented to gym and equipment including functions, settings, policies, and procedures.  Patient's individual exercise prescription and treatment plan were reviewed.  All starting workloads were established based on the results of the 6 minute walk test done at initial orientation visit.  The plan for exercise progression was also introduced and progression will be customized based on patient's performance and goals.    Dr. Emily Filbert is Medical Director for [place] and [name].

## 2020-08-07 ENCOUNTER — Other Ambulatory Visit: Payer: Self-pay

## 2020-08-07 ENCOUNTER — Ambulatory Visit
Admission: RE | Admit: 2020-08-07 | Discharge: 2020-08-07 | Disposition: A | Discharge status: Home / Self Care | Payer: Medicare Other | Source: Ambulatory Visit | Attending: Family Medicine | Admitting: Family Medicine

## 2020-08-07 DIAGNOSIS — Z1231 Encounter for screening mammogram for malignant neoplasm of breast: Secondary | ICD-10-CM | POA: Insufficient documentation

## 2020-08-12 ENCOUNTER — Other Ambulatory Visit: Payer: Self-pay | Admitting: Family

## 2020-08-12 ENCOUNTER — Encounter: Payer: Medicare Other | Admitting: *Deleted

## 2020-08-12 ENCOUNTER — Other Ambulatory Visit: Payer: Self-pay

## 2020-08-12 DIAGNOSIS — I5032 Chronic diastolic (congestive) heart failure: Secondary | ICD-10-CM

## 2020-08-12 NOTE — Progress Notes (Signed)
Daily Session Note  Patient Details  Name: Kimberly Mccarthy MRN: [id number] Date of Birth: 01-18-1956 Referring Provider:   Flowsheet Row Pulmonary Rehab from 07/24/2020 in [facility] Cardiac [facility] Pul[facility] Rehab  Referring Provider Gollan      Encounter Date: 08/12/2020  Check In:  Session Check In - 08/12/20 1003      Check-In   Supervising physician immediately available to respond to emergencies See telemetry face sheet for immediately available ER MD    Location [facility]-Cardiac & Pulmonary Rehab    Staff Present Heath Lark, RN, BSN, Laveda Norman, BS, ACSM CEP, Exercise Physiologist;Joseph Tessie Fass RCP,RRT,BSRT    Virtual Visit No    Medication changes reported     No    Fall or balance concerns reported    No    Warm-up and Cool-down Performed on first and last piece of equipment    Resistance Training Performed Yes    VAD Patient? No    PAD/SET Patient? No      Pain Assessment   Currently in Pain? No/denies              Social History   Tobacco Use  Smoking Status Never Smoker  Smokeless Tobacco Never Used    Goals Met:  Proper associated with RPD/PD & O2 Sat Independence with exercise equipment Exercise tolerated well No report of cardiac concerns or symptoms  Goals Unmet:  Not Applicable  Comments: Pt able to follow exercise prescription today without complaint.  Will continue to monitor for progression.    Dr. Emily Filbert is Medical Director for [place] and [name].

## 2020-08-14 ENCOUNTER — Other Ambulatory Visit: Payer: Self-pay

## 2020-08-14 ENCOUNTER — Encounter: Payer: Medicare Other | Admitting: *Deleted

## 2020-08-14 ENCOUNTER — Encounter: Payer: Self-pay | Admitting: *Deleted

## 2020-08-14 DIAGNOSIS — I5032 Chronic diastolic (congestive) heart failure: Secondary | ICD-10-CM | POA: Diagnosis not present

## 2020-08-14 NOTE — Progress Notes (Signed)
Daily Session Note  Patient Details  Name: Kimberly Mccarthy MRN: [id number] Date of Birth: [birth year] Referring Provider:   Flowsheet Row Pulmonary Rehab from 07/24/2020 in [facility] Cardiac and Pulmonary Rehab  Referring Provider Gollan      Encounter Date: 08/14/2020  Check In:  Session Check In - 08/14/20 0932      Check-In   Supervising physician immediately available to respond to emergencies See telemetry face sheet for immediately available ER MD    Location [facility]-Cardiac & Pulmonary Rehab    Staff Present Renita Papa, RN BSN;Joseph Hood RCP,RRT,BSRT;Melissa Botines RDN, LDN    Virtual Visit No    Medication changes reported     No    Fall or balance concerns reported    No    Warm-up and Cool-down Performed on first and last piece of equipment    Resistance Training Performed Yes    VAD Patient? No    PAD/SET Patient? No      Pain Assessment   Currently in Pain? No/denies              Social History   Tobacco Use  Smoking Status Never Smoker  Smokeless Tobacco Never Used    Goals Met:  Independence with exercise equipment Exercise tolerated well No report of cardiac concerns or symptoms Strength training completed today  Goals Unmet:  Not Applicable  Comments: Pt able to follow exercise prescription today without complaint.  Will continue to monitor for progression.    Dr. Emily Filbert is Medical Director for [place] and [name].

## 2020-08-14 NOTE — Progress Notes (Signed)
Pulmonary Individual Treatment Plan  Patient Details  Name: LEAANN NEVILS MRN: [id number] Date of Birth: 1955/06/28 Referring Provider:   Flowsheet Row Pulmonary Rehab from 07/24/2020 in [facility] Cardiac and Pulmonary Rehab  Referring Provider Gollan      Initial Encounter Date:  Flowsheet Row Pulmonary Rehab from 07/24/2020 in [facility] Cardiac and Pulmonary Rehab  Date 07/24/20      Visit Diagnosis: Heart failure, diastolic, chronic (HCC)  Patient's Home Medications on Admission:  Current Outpatient Medications:  .  FEROSUL 325 (65 Fe) MG tablet, TAKE 1 TABLET BY MOUTH ONE TIME A DAY EVERY MONDAY, WEDNESDAY, AND FRIDAY FOR IRON SUPPLEMENT, Disp: 12 tablet, Rfl: 6 .  amLODipine (NORVASC) 10 MG tablet, Take 1 tablet (10 mg total) by mouth daily., Disp: 30 tablet, Rfl: 5 .  ASPIRIN LOW DOSE 81 MG EC tablet, TAKE 1 TABLET BY MOUTH ONE TIME A DAY FOR CVA, Disp: 30 tablet, Rfl: 5 .  atorvastatin (LIPITOR) 80 MG tablet, TAKE 1 TABLET BY MOUTH DAILY, Disp: 30 tablet, Rfl: 5 .  carvedilol (COREG) 25 MG tablet, Take 1 tablet (25 mg total) by mouth 2 (two) times daily with a meal., Disp: 60 tablet, Rfl: 5 .  clopidogrel (PLAVIX) 75 MG tablet, Take 1 tablet (75 mg total) by mouth daily., Disp: 30 tablet, Rfl: 5 .  empagliflozin (JARDIANCE) 10 MG TABS tablet, Take 1 tablet (10 mg total) by mouth daily., Disp: 30 tablet, Rfl: 5 .  ezetimibe (ZETIA) 10 MG tablet, TAKE 1 TABLET BY MOUTH DAILY, Disp: 30 tablet, Rfl: 5 .  famotidine (PEPCID) 20 MG tablet, TAKE 1 TABLET BY MOUTH TWICE A DAY RELATED TO GASTROESOPHAGEAL REFLUX DISEASE, Disp: 60 tablet, Rfl: 6 .  gabapentin (NEURONTIN) 300 MG capsule, TAKE 1 CAPSULE BY MOUTH 3 TIMES A DAY FOR PAIN. DO NOT CRUSH, Disp: 90 capsule, Rfl: 3 .  hydrALAZINE (APRESOLINE) 100 MG tablet, TAKE 1 TABLET BY MOUTH IN THE MORNING, AT NOON AND AT BEDTIME, Disp: 90 tablet, Rfl: 5 .  levothyroxine (SYNTHROID) 50 MCG tablet, Take 50 mcg by mouth daily before breakfast., Disp: ,  Rfl:  .  losartan (COZAAR) 50 MG tablet, Take 1 tablet (50 mg total) by mouth daily., Disp: 90 tablet, Rfl: 1 .  Multiple Vitamin (MULTIVITAMIN ADULT PO), Take by mouth daily., Disp: , Rfl:  .  oxyCODONE (OXYCONTIN) 10 mg 12 hr tablet, Take 10 mg by mouth every 12 (twelve) hours. , Disp: , Rfl:  .  pantoprazole (PROTONIX) 40 MG tablet, Take 40 mg by mouth 2 (two) times daily., Disp: , Rfl:  .  polyethylene glycol (MIRALAX / GLYCOLAX) 17 g packet, Take 17 g by mouth daily., Disp: 14 each, Rfl: 0 .  senna (SENOKOT) 8.6 MG tablet, Take 1 tablet by mouth daily., Disp: , Rfl:  .  Vitamin D, Ergocalciferol, (DRISDOL) 1.25 MG (50000 UNIT) CAPS capsule, Take 50,000 Units by mouth once a week., Disp: , Rfl:   Past Medical History: Past Medical History:  Diagnosis Date  . Anemia   . AVM (arteriovenous malformation)   . Bronchitis   . Carotid artery disease (HCC) 10/31/2015  . Chronic respiratory failure (HCC)   . COPD (chronic obstructive pulmonary disease) (HCC)   . Critical ischemia of lower extremity (HCC) 07/18/2014  . H/O blood clots   . Hyperkalemia   . Hypertension   . Hypothyroidism 09/05/2014  . Morbid obesity (HCC)   . PVD (peripheral vascular disease) (HCC)   . Renal failure   . Stroke (  HCC)    may 2015  . Thyroid disease     Tobacco Use: Social History   Tobacco Use  Smoking Status Never Smoker  Smokeless Tobacco Never Used    Labs: Recent Review Flowsheet Data    Labs for ITP Cardiac and Pulmonary Rehab Latest Ref Rng & Units 02/14/2020 02/15/2020 02/15/2020 02/16/2020 07/15/2020   Cholestrol 100 - 199 mg/dL - - 161(W) - 960   LDLCALC 0 - 99 mg/dL - - 454(U) - 90   HDL >98 mg/dL - - 41 - 47   Trlycerides 0 - 149 mg/dL 119 147(W) 295(A) 213 77   Hemoglobin A1c 4.8 - 5.6 % - - - - -   PHART 7.350 - 7.450 - - - - -   PCO2ART 32.0 - 48.0 mmHg - - - - -   HCO3 20.0 - 28.0 mmol/L - - - - -   ACIDBASEDEF 0.0 - 2.0 mmol/L - - - - -   O2SAT % - - - - -       Pulmonary  Assessment Scores:  Pulmonary Assessment Scores    Row Name 07/24/20 1626         ADL UCSD   SOB Score total 14     Rest 0     Walk 1     Stairs 2     Bath 0     Dress 0     Shop 0           CAT Score   CAT Score 6           mMRC Score   mMRC Score 2            UCSD: Self-administered rating of dyspnea associated with activities of daily living (ADLs) 6-point scale (0 = "not at all" to 5 = "maximal or unable to do because of breathlessness")  Scoring Scores range from 0 to 120.  Minimally important difference is 5 units  CAT: CAT can identify the health impairment of COPD patients and is better correlated with disease progression.  CAT has a scoring range of zero to 40. The CAT score is classified into four groups of low (less than 10), medium (10 - 20), high (21-30) and very high (31-40) based on the impact level of disease on health status. A CAT score over 10 suggests significant symptoms.  A worsening CAT score could be explained by an exacerbation, poor medication adherence, poor inhaler technique, or progression of COPD or comorbid conditions.  CAT MCID is 2 points  mMRC: mMRC (Modified Medical Research Council) Dyspnea Scale is used to assess the degree of baseline functional disability in patients of respiratory disease due to dyspnea. No minimal important difference is established. A decrease in score of 1 point or greater is considered a positive change.   Pulmonary Function Assessment:   Exercise Target Goals: Exercise Program Goal: Individual exercise prescription set using results from initial 6 min walk test and THRR while considering  patient's activity barriers and safety.   Exercise Prescription Goal: Initial exercise prescription builds to 30-45 minutes a day of aerobic activity, 2-3 days per week.  Home exercise guidelines will be given to patient during program as part of exercise prescription that the participant will acknowledge.  Education:  Aerobic Exercise: - Group verbal and visual presentation on the components of exercise prescription. Introduces F.I.T.T principle from ACSM for exercise prescriptions.  Reviews F.I.T.T. principles of aerobic exercise including progression. Written material given at graduation.  Education: Resistance Exercise: - Group verbal and visual presentation on the components of exercise prescription. Introduces F.I.T.T principle from ACSM for exercise prescriptions  Reviews F.I.T.T. principles of resistance exercise including progression. Written material given at graduation.    Education: Exercise & Equipment Safety: - Individual verbal instruction and demonstration of equipment use and safety with use of the equipment. Flowsheet Row Pulmonary Rehab from 07/24/2020 in [facility] Cardiac and Pulmonary Rehab  Date 07/24/20  Educator AS  Instruction Review Code 1- Verbalizes Understanding      Education: Exercise Physiology & General Exercise Guidelines: - Group verbal and written instruction with models to review the exercise physiology of the cardiovascular system and associated critical values. Provides general exercise guidelines with specific guidelines to those with heart or lung disease.    Education: Flexibility, Balance, Mind/Body Relaxation: - Group verbal and visual presentation with interactive activity on the components of exercise prescription. Introduces F.I.T.T principle from ACSM for exercise prescriptions. Reviews F.I.T.T. principles of flexibility and balance exercise training including progression. Also discusses the mind body connection.  Reviews various relaxation techniques to help reduce and manage stress (i.e. Deep breathing, progressive muscle relaxation, and visualization). Balance handout provided to take home. Written material given at graduation.   Activity Barriers & Risk Stratification:  Activity Barriers & Cardiac Risk Stratification - 07/19/20 1003      Activity Barriers &  Cardiac Risk Stratification   Activity Barriers Arthritis;Joint Problems;Muscular Weakness;Balance Concerns;Assistive Device   cane and walker          6 Minute Walk:  6 Minute Walk    Row Name 07/24/20 1615         6 Minute Walk   Distance 415 feet     Walk Time 4.5 minutes     # of Rest Breaks 3     MPH 1.04     METS 0.97     RPE 15     Perceived Dyspnea  1     VO2 Peak 3.4     Symptoms Yes (comment)     Comments legs hurt 10/10     Resting HR 61 bpm     Resting BP 152/78     Resting Oxygen Saturation  95 %     Exercise Oxygen Saturation  during 6 min walk 88 %     Max Ex. HR 91 bpm     Max Ex. BP 160/68     2 Minute Post BP 144/66           Interval HR   1 Minute HR 87     2 Minute HR 84     3 Minute HR 88     4 Minute HR 91     5 Minute HR 85     6 Minute HR 91     2 Minute Post HR 72     Interval Heart Rate? Yes           Interval Oxygen   Interval Oxygen? Yes     Baseline Oxygen Saturation % 95 %     1 Minute Oxygen Saturation % 90 %     1 Minute Liters of Oxygen 0 L     2 Minute Oxygen Saturation % 90 %     2 Minute Liters of Oxygen 0 L     3 Minute Oxygen Saturation % 91 %     3 Minute Liters of Oxygen 0 L     4 Minute Oxygen Saturation %  91 %     4 Minute Liters of Oxygen 0 L     5 Minute Oxygen Saturation % 89 %     5 Minute Liters of Oxygen 0 L     6 Minute Oxygen Saturation % 88 %     6 Minute Liters of Oxygen 0 L     2 Minute Post Oxygen Saturation % 95 %     2 Minute Post Liters of Oxygen 0 L           Oxygen Initial Assessment:  Oxygen Initial Assessment - 07/19/20 1004      Home Oxygen   Home Oxygen Device None    Sleep Oxygen Prescription None    Home Exercise Oxygen Prescription None    Home Resting Oxygen Prescription None      Initial 6 min Walk   Oxygen Used None      Program Oxygen Prescription   Program Oxygen Prescription None      Intervention   Short Term Goals To learn and understand importance of maintaining  oxygen saturations>88%;To learn and understand importance of monitoring SPO2 with pulse oximeter and demonstrate accurate use of the pulse oximeter.;To learn and demonstrate proper pursed lip breathing techniques or other breathing techniques.;To learn and demonstrate proper use of respiratory medications    Long  Term Goals Verbalizes importance of monitoring SPO2 with pulse oximeter and return demonstration;Maintenance of O2 saturations>88%;Exhibits proper breathing techniques, such as pursed lip breathing or other method taught during program session;Compliance with respiratory medication;Demonstrates proper use of MDI's           Oxygen Re-Evaluation:  Oxygen Re-Evaluation    Row Name 08/05/20 1147             Program Oxygen Prescription   Program Oxygen Prescription None               Home Oxygen   Home Oxygen Device None       Sleep Oxygen Prescription None       Home Exercise Oxygen Prescription None       Home Resting Oxygen Prescription None               Goals/Expected Outcomes   Short Term Goals To learn and understand importance of maintaining oxygen saturations>88%;To learn and understand importance of monitoring SPO2 with pulse oximeter and demonstrate accurate use of the pulse oximeter.;To learn and demonstrate proper pursed lip breathing techniques or other breathing techniques.;To learn and demonstrate proper use of respiratory medications       Long  Term Goals Verbalizes importance of monitoring SPO2 with pulse oximeter and return demonstration;Maintenance of O2 saturations>88%;Exhibits proper breathing techniques, such as pursed lip breathing or other method taught during program session;Compliance with respiratory medication;Demonstrates proper use of MDI's       Comments Reviewed PLB technique with pt.  Talked about how it works and it's importance in maintaining their exercise saturations.       Goals/Expected Outcomes Short: Become more profiecient at using  PLB.   Long: Become independent at using PLB.              Oxygen Discharge (Final Oxygen Re-Evaluation):  Oxygen Re-Evaluation - 08/05/20 1147      Program Oxygen Prescription   Program Oxygen Prescription None      Home Oxygen   Home Oxygen Device None    Sleep Oxygen Prescription None    Home Exercise Oxygen Prescription None    Home Resting Oxygen  Prescription None      Goals/Expected Outcomes   Short Term Goals To learn and understand importance of maintaining oxygen saturations>88%;To learn and understand importance of monitoring SPO2 with pulse oximeter and demonstrate accurate use of the pulse oximeter.;To learn and demonstrate proper pursed lip breathing techniques or other breathing techniques.;To learn and demonstrate proper use of respiratory medications    Long  Term Goals Verbalizes importance of monitoring SPO2 with pulse oximeter and return demonstration;Maintenance of O2 saturations>88%;Exhibits proper breathing techniques, such as pursed lip breathing or other method taught during program session;Compliance with respiratory medication;Demonstrates proper use of MDI's    Comments Reviewed PLB technique with pt.  Talked about how it works and it's importance in maintaining their exercise saturations.    Goals/Expected Outcomes Short: Become more profiecient at using PLB.   Long: Become independent at using PLB.           Initial Exercise Prescription:  Initial Exercise Prescription - 07/24/20 1600      Date of Initial Exercise RX and Referring Provider   Date 07/24/20    Referring Provider Gollan      Treadmill   MPH 1    Grade 0    Minutes 15    METs 1      Recumbant Bike   Level 1    RPM 60    Minutes 15    METs 1      NuStep   Level 1    SPM 80    Minutes 15    METs 1      Arm Ergometer   Level 1    RPM 25    Minutes 15    METs 1      Recumbant Elliptical   Level 1    RPM 50    Minutes 15    METs 1      Biostep-RELP   Level 1     SPM 50    Minutes 15    METs 1      Prescription Details   Frequency (times per week) 3    Duration Progress to 30 minutes of continuous aerobic without signs/symptoms of physical distress      Intensity   THRR 40-80% of Max Heartrate 99-137    Ratings of Perceived Exertion 11-13    Perceived Dyspnea 0-4      Resistance Training   Training Prescription Yes    Weight 3 lb    Reps 10-15           Perform Capillary Blood Glucose checks as needed.  Exercise Prescription Changes:  Exercise Prescription Changes    Row Name 07/24/20 1600             Response to Exercise   Blood Pressure (Admit) 152/78       Blood Pressure (Exercise) 160/68       Blood Pressure (Exit) 144/66       Heart Rate (Admit) 61 bpm       Heart Rate (Exercise) 91 bpm       Heart Rate (Exit) 72 bpm       Oxygen Saturation (Admit) 95 %       Oxygen Saturation (Exercise) 88 %       Oxygen Saturation (Exit) 95 %       Rating of Perceived Exertion (Exercise) 15       Perceived Dyspnea (Exercise) 1       Symptoms hips hurt  Exercise Comments:   Exercise Goals and Review:  Exercise Goals    Row Name 07/24/20 1625             Exercise Goals   Increase Physical Activity Yes       Intervention Provide advice, education, support and counseling about physical activity/exercise needs.;Develop an individualized exercise prescription for aerobic and resistive training based on initial evaluation findings, risk stratification, comorbidities and participant's personal goals.       Expected Outcomes Short Term: Attend rehab on a regular basis to increase amount of physical activity.;Long Term: Add in home exercise to make exercise part of routine and to increase amount of physical activity.;Long Term: Exercising regularly at least 3-5 days a week.       Increase Strength and Stamina Yes       Intervention Provide advice, education, support and counseling about physical activity/exercise  needs.;Develop an individualized exercise prescription for aerobic and resistive training based on initial evaluation findings, risk stratification, comorbidities and participant's personal goals.       Expected Outcomes Short Term: Increase workloads from initial exercise prescription for resistance, speed, and METs.;Short Term: Perform resistance training exercises routinely during rehab and add in resistance training at home;Long Term: Improve cardiorespiratory fitness, muscular endurance and strength as measured by increased METs and functional capacity ( )       Able to understand and use rate of perceived exertion (RPE) scale Yes       Intervention Provide education and explanation on how to use RPE scale       Expected Outcomes Short Term: Able to use RPE daily in rehab to express subjective intensity level;Long Term:  Able to use RPE to guide intensity level when exercising independently       Able to understand and use Dyspnea scale Yes       Intervention Provide education and explanation on how to use Dyspnea scale       Expected Outcomes Short Term: Able to use Dyspnea scale daily in rehab to express subjective sense of shortness of breath during exertion;Long Term: Able to use Dyspnea scale to guide intensity level when exercising independently       Knowledge and understanding of Target Heart Rate Range (THRR) Yes       Intervention Provide education and explanation of THRR including how the numbers were predicted and where they are located for reference       Expected Outcomes Short Term: Able to state/look up THRR;Short Term: Able to use daily as guideline for intensity in rehab;Long Term: Able to use THRR to govern intensity when exercising independently       Able to check pulse independently Yes       Intervention Provide education and demonstration on how to check pulse in carotid and radial arteries.;Review the importance of being able to check your own pulse for safety during  independent exercise       Expected Outcomes Short Term: Able to explain why pulse checking is important during independent exercise;Long Term: Able to check pulse independently and accurately       Understanding of Exercise Prescription Yes       Intervention Provide education, explanation, and written materials on patient's individual exercise prescription       Expected Outcomes Short Term: Able to explain program exercise prescription;Long Term: Able to explain home exercise prescription to exercise independently              Exercise Goals Re-Evaluation :  Exercise Goals Re-Evaluation    Row Name 08/05/20 1146             Exercise Goal Re-Evaluation   Exercise Goals Review Increase Physical Activity;Able to understand and use rate of perceived exertion (RPE) scale;Understanding of Exercise Prescription;Increase Strength and Stamina;Able to check pulse independently       Comments Reviewed RPE and dyspnea scales, THR and program prescription with pt today.  Pt voiced understanding and was given a copy of goals to take home.       Expected Outcomes Short: Use RPE daily to regulate intensity. Long: Follow program prescription in THR.              Discharge Exercise Prescription (Final Exercise Prescription Changes):  Exercise Prescription Changes - 07/24/20 1600      Response to Exercise   Blood Pressure (Admit) 152/78    Blood Pressure (Exercise) 160/68    Blood Pressure (Exit) 144/66    Heart Rate (Admit) 61 bpm    Heart Rate (Exercise) 91 bpm    Heart Rate (Exit) 72 bpm    Oxygen Saturation (Admit) 95 %    Oxygen Saturation (Exercise) 88 %    Oxygen Saturation (Exit) 95 %    Rating of Perceived Exertion (Exercise) 15    Perceived Dyspnea (Exercise) 1    Symptoms hips hurt           Nutrition:  Target Goals: Understanding of nutrition guidelines, daily intake of sodium 1500mg , cholesterol 200mg , calories 30% from fat and 7% or less from saturated fats, daily to  have 5 or more servings of fruits and vegetables.  Education: All About Nutrition: -Group instruction provided by verbal, written material, interactive activities, discussions, models, and posters to present general guidelines for heart healthy nutrition including fat, fiber, MyPlate, the role of sodium in heart healthy nutrition, utilization of the nutrition label, and utilization of this knowledge for meal planning. Follow up email sent as well. Written material given at graduation.   Biometrics:  Pre Biometrics - 07/24/20 1625      Pre Biometrics   Height 5\' 6"  (1.676 m)    Weight 258 lb (117 kg)    BMI (Calculated) 41.66            Nutrition Therapy Plan and Nutrition Goals:   Nutrition Assessments:  MEDIFICTS Score Key:  ?70 Need to make dietary changes   40-70 Heart Healthy Diet  ? 40 Therapeutic Level Cholesterol Diet  Flowsheet Row Pulmonary Rehab from 07/24/2020 in [facility] Cardiac and Pulmonary Rehab  Picture Your Plate Total Score on Admission 58     Picture Your Plate Scores:  [facility] Unhealthy dietary pattern with much room for improvement.  41-50 Dietary pattern unlikely to meet recommendations for good health and room for improvement.  51-60 More healthful dietary pattern, with some room for improvement.   >60 Healthy dietary pattern, although there may be some specific behaviors that could be improved.   Nutrition Goals Re-Evaluation:   Nutrition Goals Discharge (Final Nutrition Goals Re-Evaluation):   Psychosocial: Target Goals: Acknowledge presence or absence of significant depression and/or stress, maximize coping skills, provide positive support system. Participant is able to verbalize types and ability to use techniques and skills needed for reducing stress and depression.   Education: Stress, Anxiety, and Depression - Group verbal and visual presentation to define topics covered.  Reviews how body is impacted by stress, anxiety, and depression.   Also discusses healthy ways to reduce stress and to  treat/manage anxiety and depression.  Written material given at graduation.   Education: Sleep Hygiene -Provides group verbal and written instruction about how sleep can affect your health.  Define sleep hygiene, discuss sleep cycles and impact of sleep habits. Review good sleep hygiene tips.    Initial Review & Psychosocial Screening:  Initial Psych Review & Screening - 07/19/20 1006      Initial Review   Current issues with Current Stress Concerns    Source of Stress Concerns Unable to participate in former interests or hobbies;Unable to perform yard/household activities;Chronic Illness      Family Dynamics   Good Support System? Yes   sister; family     Barriers   Psychosocial barriers to participate in program There are no identifiable barriers or psychosocial needs.;The patient should benefit from training in stress management and relaxation.      Screening Interventions   Interventions Encouraged to exercise;To provide support and resources with identified psychosocial needs;Provide feedback about the scores to participant    Expected Outcomes Short Term goal: Utilizing psychosocial counselor, staff and physician to assist with identification of specific Stressors or current issues interfering with healing process. Setting desired goal for each stressor or current issue identified.;Long Term Goal: Stressors or current issues are controlled or eliminated.;Short Term goal: Identification and review with participant of any Quality of Life or Depression concerns found by scoring the questionnaire.;Long Term goal: The participant improves quality of Life and PHQ9 Scores as seen by post scores and/or verbalization of changes           Quality of Life Scores:  Scores of 19 and below usually indicate a poorer quality of life in these areas.  A difference of  2-3 points is a clinically meaningful difference.  A difference of 2-3 points in  the total score of the Quality of Life Index has been associated with significant improvement in overall quality of life, self-image, physical symptoms, and general health in studies assessing change in quality of life.  PHQ-9: Recent Review Flowsheet Data    Depression screen [facility] 2/9 07/24/2020 08/30/2015 08/01/2015 07/02/2015 06/06/2015   Decreased Interest 0 0 0 0 0   Down, Depressed, Hopeless 0 0 0 0 0   PHQ - 2 Score 0 0 0 0 0   Altered sleeping 0 - - - -   Tired, decreased energy 0 - - - -   Change in appetite 0 - - - -   Feeling bad or failure about yourself  0 - - - -   Trouble concentrating 0 - - - -   Moving slowly or fidgety/restless 0 - - - -   Suicidal thoughts 0 - - - -   PHQ-9 Score 0 - - - -     Interpretation of Total Score  Total Score Depression Severity:  1-4 = Minimal depression, 5-9 = Mild depression, 10-14 = Moderate depression, 15-19 = Moderately severe depression, 20-27 = Severe depression   Psychosocial Evaluation and Intervention:  Psychosocial Evaluation - 07/19/20 1011      Psychosocial Evaluation & Interventions   Interventions Encouraged to exercise with the program and follow exercise prescription    Comments Ms. Varano is coming to Pulmonary Rehab with diastolic heart failure. She states her breathing is her main source of stress and she is tired of feeling short of breath. Her family is her main support system and lives close by to help with things. She did get referred in December but wanted to  wait until it got warmer to start pulmonary rehab, in the meantime she had been working on her diet and has lost some weight which helps her feel better. She didn't report any other stressors and states she just takes it day by day.    Expected Outcomes Short: attend Pulmonary Rehab for education and exercise. Long: develop positive self care habits.    Continue Psychosocial Services  Follow up required by staff           Psychosocial  Re-Evaluation:   Psychosocial Discharge (Final Psychosocial Re-Evaluation):   Education: Education Goals: Education classes will be provided on a weekly basis, covering required topics. Participant will state understanding/return demonstration of topics presented.  Learning Barriers/Preferences:  Learning Barriers/Preferences - 07/19/20 1014      Learning Barriers/Preferences   Learning Barriers None    Learning Preferences None           General Pulmonary Education Topics:  Infection Prevention: - Provides verbal and written material to individual with discussion of infection control including proper hand washing and proper equipment cleaning during exercise session. [facility]  Date 07/24/20  Educator AS  Instruction Review Code 1- Verbalizes Understanding      Falls Prevention: - Provides verbal and written material to individual with discussion of falls prevention and safety. Flow[facility]  Date 07/24/20  Educator AS  Instruction Review Code 1- Verbalizes Understanding      Chronic Lung Disease Review: - Group verbal instruction with posters, models, PowerPoint presentations and videos,  to review new updates, new respiratory medications, new advancements in procedures and treatments. Providing information on websites and "800" numbers for continued self-education. Includes information about supplement oxygen, available portable oxygen systems, continuous and intermittent flow rates, oxygen safety, concentrators, and Medicare reimbursement for oxygen. Explanation of Pulmonary Drugs, including class, frequency, complications, importance of spacers, rinsing mouth after steroid MDI's, and proper cleaning methods for nebulizers. Review of basic lung anatomy and physiology related to function, structure, and complications of lung disease.  Review of risk factors. Discussion about methods for diagnosing sleep apnea and types of masks and machines for OSA. Includes a review of the use of types of environmental controls: home humidity, furnaces, filters, dust mite/pet prevention, HEPA vacuums. Discussion about weather changes, air quality and the benefits of nasal washing. Instruction on Warning signs, infection symptoms, calling MD promptly, preventive modes, and value of vaccinations. Review of effective airway clearance, coughing and/or vibration techniques. Emphasizing that all should Create an Action Plan. Written material given at graduation.   AED/CPR: - Group verbal and written instruction with the use of models to demonstrate the basic use of the AED with the basic ABC's of resuscitation.    Anatomy and Cardiac Procedures: - Group verbal and visual presentation and models provide information about basic cardiac anatomy and function. Reviews the testing methods done to diagnose heart disease and the outcomes of the test results. Describes the treatment choices: Medical Management, Angioplasty, or Coronary Bypass Surgery for treating various heart conditions including Myocardial Infarction, Angina, Valve Disease, and Cardiac Arrhythmias.  Written material given at graduation.   Medication Safety: - Group verbal and visual instruction to review commonly prescribed medications for heart and lung disease. Reviews the medication, class of the drug, and side effects. Includes the steps to properly store meds and maintain the prescription regimen.  Written material given at graduation.  Other: -Provides group and verbal instruction on various topics (see comments)   Knowledge Questionnaire Score:  Knowledge Questionnaire Score - 07/24/20 1628      Knowledge Questionnaire Score   Pre Score 15/18  ADL, oxygen            Core Components/Risk Factors/Patient Goals at Admission:  Personal Goals and Risk Factors at Admission -  07/24/20 1630      Core Components/Risk Factors/Patient Goals on Admission    Weight Management Yes;Weight Loss    Intervention Weight Management: Develop a combined nutrition and exercise program designed to reach desired caloric intake, while maintaining appropriate intake of nutrient and fiber, sodium and fats, and appropriate energy expenditure required for the weight goal.;Weight Management/Obesity: Establish reasonable short term and long term weight goals.    Admit Weight 258 lb (117 kg)    Goal Weight: Short Term 250 lb (113.4 kg)    Goal Weight: Long Term 240 lb (108.9 kg)    Expected Outcomes Short Term: Continue to assess and modify interventions until short term weight is achieved;Long Term: Adherence to nutrition and physical activity/exercise program aimed toward attainment of established weight goal;Weight Maintenance: Understanding of the daily nutrition guidelines, which includes 25-35% calories from fat, 7% or less cal from saturated fats, less than 200mg  cholesterol, less than 1.5gm of sodium, & 5 or more servings of fruits and vegetables daily;Weight Loss: Understanding of general recommendations for a balanced deficit meal plan, which promotes 1-2 lb weight loss per week and includes a negative energy balance of [phone] kcal/d;Understanding recommendations for meals to include 15-35% energy as protein, 25-35% energy from fat, 35-60% energy from carbohydrates, less than 200mg  of dietary cholesterol, 20-35 gm of total fiber daily;Understanding of distribution of calorie intake throughout the day with the consumption of 4-5 meals/snacks    Heart Failure Yes    Intervention Provide a combined exercise and nutrition program that is supplemented with education, support and counseling about heart failure. Directed toward relieving symptoms such as shortness of breath, decreased exercise tolerance, and extremity edema.    Expected Outcomes Improve functional capacity of life;Short term:  Attendance in program 2-3 days a week with increased exercise capacity. Reported lower sodium intake. Reported increased fruit and vegetable intake. Reports medication compliance.;Short term: Daily weights obtained and reported for increase. Utilizing diuretic protocols set by physician.;Long term: Adoption of self-care skills and reduction of barriers for early signs and symptoms recognition and intervention leading to self-care maintenance.    Hypertension Yes    Intervention Provide education on lifestyle modifcations including regular physical activity/exercise, weight management, moderate sodium restriction and increased consumption of fresh fruit, vegetables, and low fat dairy, alcohol moderation, and smoking cessation.;Monitor prescription use compliance.    Expected Outcomes Short Term: Continued assessment and intervention until BP is < 140/6mm HG in hypertensive participants. < 130/66mm HG in hypertensive participants with diabetes, heart failure or chronic kidney disease.;Long Term: Maintenance of blood pressure at goal levels.           Education:Diabetes - Individual verbal and written instruction to review signs/symptoms of diabetes, desired ranges of glucose level fasting, after meals and with exercise. Acknowledge that pre and post exercise glucose checks will be done for 3 sessions at entry of program.   Know Your Numbers and Heart Failure: - Group verbal and visual instruction to discuss disease risk factors for cardiac and pulmonary disease and treatment options.  Reviews associated critical values for Overweight/Obesity, Hypertension, Cholesterol, and Diabetes.  Discusses basics  of heart failure: signs/symptoms and treatments.  Introduces Heart Failure Zone chart for action plan for heart failure.  Written material given at graduation.   Core Components/Risk Factors/Patient Goals Review:    Core Components/Risk Factors/Patient Goals at Discharge (Final Review):    ITP  Comments:  ITP Comments    Row Name 07/19/20 1010 07/24/20 1636 08/05/20 1145 08/14/20 0931     ITP Comments Initial telephone orientations completed. Diagnosis can be found in [facility] 3/21. EP orientation scheduled for 3/30 at 1:30. Completed and orientation.  Initial ITP sent to Dr Hyacinth Meeker First full day of exercise!  Patient was oriented to gym and equipment including functions, settings, policies, and procedures.  Patient's individual exercise prescription and treatment plan were reviewed.  All starting workloads were established based on the results of the 6 minute walk test done at initial orientation visit.  The plan for exercise progression was also introduced and progression will be customized based on patient's performance and goals. 30 Day review completed. Medical Director ITP review done, changes made as directed, and signed approval by Medical Director.   New to program           Comments:

## 2020-08-16 ENCOUNTER — Other Ambulatory Visit: Payer: Self-pay

## 2020-08-16 ENCOUNTER — Encounter: Payer: Medicare Other | Admitting: *Deleted

## 2020-08-16 DIAGNOSIS — I5032 Chronic diastolic (congestive) heart failure: Secondary | ICD-10-CM | POA: Diagnosis not present

## 2020-08-16 NOTE — Progress Notes (Signed)
Daily Session Note  Patient Details  Name: Kimberly Mccarthy MRN: [id number] Date of Birth: [birth year] Referring Provider:   Flowsheet Row Pulmonary Rehab from 07/24/2020 in [facility] Cardiac and Pulmonary Rehab  Referring Provider Gollan      Encounter Date: 08/16/2020  Check In:  Session Check In - 08/16/20 1005      Check-In   Supervising physician immediately available to respond to emergencies See telemetry face sheet for immediately available ER MD    Location [facility]-Cardiac & Pulmonary Rehab    Staff Present Heath Lark, RN, BSN, CCRP;Joseph Lou Miner, [place] Exercise Physiologist    Virtual Visit No    Medication changes reported     No    Fall or balance concerns reported    No    Warm-up and Cool-down Performed on first and last piece of equipment    Resistance Training Performed Yes    VAD Patient? No    PAD/SET Patient? No      Pain Assessment   Currently in Pain? No/denies              Social History   Tobacco Use  Smoking Status Never Smoker  Smokeless Tobacco Never Used    Goals Met:  Proper associated with RPD/PD & O2 Sat Independence with exercise equipment Exercise tolerated well No report of cardiac concerns or symptoms  Goals Unmet:  Not Applicable  Comments: Pt able to follow exercise prescription today without complaint.  Will continue to monitor for progression.    Dr. Emily Filbert is Medical Director for [place] and [name].

## 2020-08-19 ENCOUNTER — Encounter: Payer: Medicare Other | Admitting: *Deleted

## 2020-08-19 ENCOUNTER — Other Ambulatory Visit: Payer: Self-pay

## 2020-08-19 DIAGNOSIS — I5032 Chronic diastolic (congestive) heart failure: Secondary | ICD-10-CM | POA: Diagnosis not present

## 2020-08-19 NOTE — Progress Notes (Signed)
Daily Session Note  Patient Details  Name: Kimberly Mccarthy MRN: [id number] Date of Birth: December 19, 1955 Referring Provider:   Flowsheet Row Pulmonary Rehab from 07/24/2020 in [facility] Cardiac and Pulmonary Rehab  Referring Provider Gollan      Encounter Date: 08/19/2020  Check In:  Session Check In - 08/19/20 1023      Check-In   Supervising physician immediately available to respond to emergencies See telemetry face sheet for immediately available ER MD    Location [facility]-Cardiac & Pulmonary Rehab    Staff Present Heath Lark, RN, BSN, CCRP;Joseph Hood RCP,RRT,BSRT;Kelly [place], [place], ACSM CEP, Exercise Physiologist    Virtual Visit No    Medication changes reported     No    Fall or balance concerns reported    No    Warm-up and Cool-down Performed on first and last piece of equipment    Resistance Training Performed Yes    VAD Patient? No    PAD/SET Patient? No      Pain Assessment   Currently in Pain? No/denies              Social History   Tobacco Use  Smoking Status Never Smoker  Smokeless Tobacco Never Used    Goals Met:  Proper associated with RPD/PD & O2 Sat Independence with exercise equipment Exercise tolerated well No report of cardiac concerns or symptoms  Goals Unmet:  Not Applicable  Comments: Pt able to follow exercise prescription today without complaint.  Will continue to monitor for progression.    Dr. Emily Filbert is Medical Director for [place] and [name].

## 2020-08-21 ENCOUNTER — Other Ambulatory Visit: Payer: Self-pay

## 2020-08-21 DIAGNOSIS — I5032 Chronic diastolic (congestive) heart failure: Secondary | ICD-10-CM

## 2020-08-21 NOTE — Progress Notes (Signed)
Daily Session Note  Patient Details  Name: Kimberly Mccarthy MRN: [id number] Date of Birth: Dec 09, 1955 Referring Provider:   Flowsheet Row Pulmonary Rehab from 07/24/2020 in [facility] Cardiac and Pulmonary Rehab  Referring Provider Gollan      Encounter Date: 08/21/2020  Check In:  Session Check In - 08/21/20 0917      Check-In   Supervising physician immediately available to respond to emergencies See telemetry face sheet for immediately available ER MD    Location [facility]-Cardiac & Pulmonary Rehab    Staff Present Birdie Sons, MPA, Elveria Rising, BA, ACSM CEP, Exercise Physiologist;Joseph Tessie Fass RCP,RRT,BSRT    Virtual Visit No    Medication changes reported     No    Fall or balance concerns reported    No    Warm-up and Cool-down Performed on first and last piece of equipment    Resistance Training Performed Yes    VAD Patient? No    PAD/SET Patient? No      Pain Assessment   Currently in Pain? No/denies              Social History   Tobacco Use  Smoking Status Never Smoker  Smokeless Tobacco Never Used    Goals Met:  Independence with exercise equipment Exercise tolerated well No report of cardiac concerns or symptoms Strength training completed today  Goals Unmet:  Not Applicable  Comments: Pt able to follow exercise prescription today without complaint.  Will continue to monitor for progression.    Dr. Emily Filbert is Medical Director for [place] and [name].

## 2020-08-23 ENCOUNTER — Other Ambulatory Visit: Payer: Self-pay

## 2020-08-23 ENCOUNTER — Other Ambulatory Visit: Payer: Self-pay | Admitting: Family

## 2020-08-23 DIAGNOSIS — I5032 Chronic diastolic (congestive) heart failure: Secondary | ICD-10-CM | POA: Diagnosis not present

## 2020-08-23 NOTE — Progress Notes (Signed)
Daily Session Note  Patient Details  Name: Kimberly Mccarthy MRN: [id number] Date of Birth: 1955/09/26 Referring Provider:   April Manson Pulmonary Rehab from 07/24/2020 in [facility] Cardiac and Pulmonary Rehab  Referring Provider Gollan      Encounter Date: 08/23/2020  Check In:  Session Check In - 08/23/20 0951      Check-In   Supervising physician immediately available to respond to emergencies See telemetry face sheet for immediately available ER MD    Location [facility]-Cardiac & Pulmonary Rehab    Staff Present Basilia Jumbo, RN, BSN;Meredith Sherryll Burger, RN BSN;Joseph [address] [place], [place], [place], CCRP, CCET    Virtual Visit No    Medication changes reported     No    Fall or balance concerns reported    No    Tobacco Cessation No Change    Warm-up and Cool-down Performed on first and last piece of equipment    Resistance Training Performed Yes    VAD Patient? No    PAD/SET Patient? No      Pain Assessment   Currently in Pain? No/denies              Social History   Tobacco Use  Smoking Status Never Smoker  Smokeless Tobacco Never Used    Goals Met:  Independence with exercise equipment Exercise tolerated well No report of cardiac concerns or symptoms  Goals Unmet:  Not Applicable  Comments: Pt able to follow exercise prescription today without complaint.  Will continue to monitor for progression.    Dr. Emily Filbert is Medical Director for [place] and [name].

## 2020-08-28 ENCOUNTER — Other Ambulatory Visit: Payer: Self-pay

## 2020-08-28 ENCOUNTER — Encounter: Payer: Medicare Other | Attending: Cardiovascular Disease

## 2020-08-28 DIAGNOSIS — I5032 Chronic diastolic (congestive) heart failure: Secondary | ICD-10-CM | POA: Diagnosis not present

## 2020-08-28 NOTE — Progress Notes (Signed)
Daily Session Note  Patient Details  Name: PAHOUA SCHREINER MRN: [id number] Date of Birth: [birth year] Referring Provider:   Flowsheet Row Pulmonary Rehab from 07/24/2020 in [facility] Cardiac and Pulmonary Rehab  Referring Provider Gollan      Encounter Date: 08/28/2020  Check In:  Session Check In - 08/28/20 0933      Check-In   Supervising physician immediately available to respond to emergencies See telemetry face sheet for immediately available ER MD    Location [facility]-Cardiac & Pulmonary Rehab    Staff Present Birdie Sons, MPA, RN;Joseph Darrin Ni[place], [place], RCEP, CCRP, CCET    Virtual Visit No    Medication changes reported     No    Fall or balance concerns reported    No    Tobacco Cessation No Change    Warm-up and Cool-down Performed on first and last piece of equipment    Resistance Training Performed Yes    VAD Patient? No    PAD/SET Patient? No      Pain Assessment   Currently in Pain? No/denies              Social History   Tobacco Use  Smoking Status Never Smoker  Smokeless Tobacco Never Used    Goals Met:  Independence with exercise equipment Exercise tolerated well No report of cardiac concerns or symptoms Strength training completed today  Goals Unmet:  Not Applicable  Comments: Pt able to follow exercise prescription today without complaint.  Will continue to monitor for progression.    Dr. Emily Filbert is Medical Director for [place] and [name].

## 2020-08-30 ENCOUNTER — Other Ambulatory Visit: Payer: Self-pay

## 2020-08-30 ENCOUNTER — Encounter: Payer: Medicare Other | Admitting: *Deleted

## 2020-08-30 DIAGNOSIS — I5032 Chronic diastolic (congestive) heart failure: Secondary | ICD-10-CM | POA: Diagnosis not present

## 2020-08-30 NOTE — Progress Notes (Signed)
Daily Session Note  Patient Details  Name: Kimberly Mccarthy MRN: [id number] Date of Birth: 01-02-1956 Referring Provider:   Flowsheet Row Pulmonary Rehab from 07/24/2020 in [facility] Cardiac and Pulmonary Rehab  Referring Provider Gollan      Encounter Date: 08/30/2020  Check In:  Session Check In - 08/30/20 4709      Check-In   Supervising physician immediately available to respond to emergencies See telemetry face sheet for immediately available ER MD    Location [facility]-Cardiac & Pulmonary Rehab    Staff Present Kimberly Lark, RN, BSN, CCRP;Kimberly [place], MA, RCEP, CCRP, CCET;Kimberly Mccarthy RCP,RRT,BSRT    Virtual Visit No    Medication changes reported     NO   Comments    Fall or balance concerns reported    No    Warm-up and Cool-down Performed on first and last piece of equipment    Resistance Training Performed Yes    VAD Patient? No    PAD/SET Patient? No      Pain Assessment   Currently in Pain? No/denies              Social History   Tobacco Use  Smoking Status Never Smoker  Smokeless Tobacco Never Used    Goals Met:  Proper associated with RPD/PD & O2 Sat Independence with exercise equipment Exercise tolerated well No report of cardiac concerns or symptoms  Goals Unmet:  Not Applicable  Comments: Pt able to follow exercise prescription today without complaint.  Will continue to monitor for progression. Kimberly Mccarthy's weight was up over 4 pounds. She does not have a diuretic listed on her med list. She was advised to call her doctor today to review the weight gain.   She verbalized understanding of this step.   Dr. Emily Mccarthy is Medical Director for [place] and [name].

## 2020-09-02 ENCOUNTER — Encounter: Payer: Medicare Other | Admitting: *Deleted

## 2020-09-02 ENCOUNTER — Telehealth: Payer: Self-pay | Admitting: Cardiovascular Disease

## 2020-09-02 ENCOUNTER — Other Ambulatory Visit: Payer: Self-pay

## 2020-09-02 DIAGNOSIS — I5032 Chronic diastolic (congestive) heart failure: Secondary | ICD-10-CM | POA: Diagnosis not present

## 2020-09-02 NOTE — Progress Notes (Signed)
Daily Session Note  Patient Details  Name: Kimberly Mccarthy MRN: [id number] Date of Birth: 12-20-1955 Referring Provider:   Flowsheet Row Pulmonary Rehab from 07/24/2020 in [facility] Cardiac and Pulmonary Rehab  Referring Provider Gollan      Encounter Date: 09/02/2020  Check In:      Social History   Tobacco Use  Smoking Status Never Smoker  Smokeless Tobacco Never Used    Goals Met:  Proper associated with RPD/PD & O2 Sat Independence with exercise equipment Exercise tolerated well No report of cardiac concerns or symptoms  Goals Unmet:  Not Applicable  Comments: Pt able to follow exercise prescription today without complaint.  Will continue to monitor for progression.    Dr. Emily Filbert is Medical Director for [place] and [name].

## 2020-09-02 NOTE — Telephone Encounter (Signed)
Patient friend calling States that patient has had some fluid build up and rehab wanted to know if she was taking Lasix medication  Please call to discuss

## 2020-09-03 NOTE — Telephone Encounter (Signed)
Per DPR we are unable to discuss any information with anyone other than pt:  ALL [facility]  , Designated Party form signed for ___12-21-21 _ . Can leave detailed message on ___NONE.  Authorized to discuss medical info with __NONE__.  Attempted to call pt to discuss any incr fluid volume symptoms. No answer. Lmtcb.   Last ov with Cadence Elverta, [place] 07/15/20. Pt euvolemic at visit. Does not take fluid pill.  Next office visit scheduled 10/15/20 with Dr. Mariah Milling.

## 2020-09-04 ENCOUNTER — Other Ambulatory Visit: Payer: Self-pay

## 2020-09-04 DIAGNOSIS — I5032 Chronic diastolic (congestive) heart failure: Secondary | ICD-10-CM

## 2020-09-04 NOTE — Telephone Encounter (Signed)
Calling back, please advise!

## 2020-09-04 NOTE — Telephone Encounter (Signed)
Was able to reach back out to Kimberly Mccarthy, she reports at cardiac rehab on Monday, they were concern for "slight" fluid build up to her left foot/ankle area. They question if she was on a "fluid pill". Advised pt based on her medication list, she is currently not on a diuretic.   Kimberly Mccarthy, went to cardiac rehab today, good therapy session, no fluid build-up at this time, denies sob of CP during her therapy session. Currently sitting at home watching TV with her bilateral legs propped up at heart level, reports "anytime I sit down I try to keep them legs up and elevated". Pt reports no cardiac associated symptoms. Advised to check for "indention" with her finger to lower legs, pt denied pitting edema, no swelling, redness, or pain to lower extremities.   However, did advised Kimberly Mccarthy to monitor her swelling if it reoccurs, if swelling reoccurs, then she can try the following:  - elevate lower extremities when possible as she been doing  - use some compression socks or hose   - decrease her salt intake, be mindful of restaurant foods (high in salt content)  monitor fluid intake  - weight herslef daily in the morning after getting up and voiding and record these weights if notice swelling is starting to be daily and more accurant.   Kimberly Mccarthy reports she is already buying can foods that are "low sodium/salt" or no salt at all. Has an appt with Dr. Mariah Milling 6/21 for a 3 month f/u, advised if the swelling is more accurant and starting to have associated symptoms such as shortness of breath, then she needs to call to have a sooner appt for evaluation and possible need for a diuretic medication. Kimberly Mccarthy is very appreciated of the return phone call, otherwise all questions or concerns were address and no additional concerns at this time. Agreeable to plan, will call back for anything further.

## 2020-09-04 NOTE — Progress Notes (Signed)
Daily Session Note  Patient Details  Name: SIYAH MAULT MRN: [id number] Date of Birth: 12-Jan-1956 Referring Provider:   Flowsheet Row Pulmonary Rehab from 07/24/2020 in [facility] Cardiac and Pulmonary Rehab  Referring Provider Gollan      Encounter Date: 09/04/2020  Check In:  Session Check In - 09/04/20 0927      Check-In   Supervising physician immediately available to respond to emergencies See telemetry face sheet for immediately available ER MD    Location [facility]-Cardiac & Pulmonary Rehab    Staff Present Birdie Sons, MPA, Elveria Rising, BA, ACSM CEP, Exercise Physiologist;Joseph Tessie Fass RCP,RRT,BSRT    Virtual Visit No    Medication changes reported     No    Fall or balance concerns reported    No    Tobacco Cessation No Change    Warm-up and Cool-down Performed on first and last piece of equipment    Resistance Training Performed Yes    VAD Patient? No    PAD/SET Patient? No      Pain Assessment   Currently in Pain? No/denies              Social History   Tobacco Use  Smoking Status Never Smoker  Smokeless Tobacco Never Used    Goals Met:  Independence with exercise equipment Exercise tolerated well No report of cardiac concerns or symptoms Strength training completed today  Goals Unmet:  Not Applicable  Comments: Pt able to follow exercise prescription today without complaint.  Will continue to monitor for progression.  Reviewed home exercise with pt today.  Pt plans to walk at home for exercise.  Reviewed THR, pulse, RPE, sign and symptoms, pulse oximetery and when to call 911 or MD.  Also discussed weather considerations and indoor options.  Pt voiced understanding.   Dr. Emily Filbert is Medical Director for [place] and [name].

## 2020-09-06 ENCOUNTER — Encounter: Payer: Medicare Other | Admitting: *Deleted

## 2020-09-06 ENCOUNTER — Other Ambulatory Visit: Payer: Self-pay

## 2020-09-06 DIAGNOSIS — I5032 Chronic diastolic (congestive) heart failure: Secondary | ICD-10-CM

## 2020-09-06 NOTE — Progress Notes (Signed)
Daily Session Note  Patient Details  Name: Kimberly Mccarthy MRN: [id number] Date of Birth: 1955/08/28 Referring Provider:   Flowsheet Row Pulmonary Rehab from 07/24/2020 in [facility] Cardiac and Pulmonary Rehab  Referring Provider Gollan      Encounter Date: 09/06/2020  Check In:  Session Check In - 09/06/20 1000      Check-In   Supervising physician immediately available to respond to emergencies See telemetry face sheet for immediately available ER MD    Location [facility]-Cardiac & Pulmonary Rehab    Staff Present Hope Budds RDN, LDN;Jessica Luan Pulling, MA, RCEP, CCRP, CCET;Zeus Marquis, RN, BSN, CCRP    Virtual Visit No    Medication changes reported     No    Fall or balance concerns reported    No    Warm-up and Cool-down Performed on first and last piece of equipment    Resistance Training Performed Yes    VAD Patient? No    PAD/SET Patient? No      Pain Assessment   Currently in Pain? No/denies              Social History   Tobacco Use  Smoking Status Never Smoker  Smokeless Tobacco Never Used    Goals Met:  Proper associated with RPD/PD & O2 Sat Independence with exercise equipment Exercise tolerated well No report of cardiac concerns or symptoms  Goals Unmet:  Not Applicable  Comments: Pt able to follow exercise prescription today without complaint.  Will continue to monitor for progression.    Dr. Emily Filbert is Medical Director for [place] and [name].

## 2020-09-09 ENCOUNTER — Encounter: Payer: Medicare Other | Admitting: *Deleted

## 2020-09-09 ENCOUNTER — Other Ambulatory Visit: Payer: Self-pay

## 2020-09-09 DIAGNOSIS — I5032 Chronic diastolic (congestive) heart failure: Secondary | ICD-10-CM | POA: Diagnosis not present

## 2020-09-09 NOTE — Progress Notes (Signed)
Daily Session Note  Patient Details  Name: Kimberly Mccarthy MRN: [id number] Date of Birth: [birth year] Referring Provider:   Flowsheet Row Pulmonary Rehab from 07/24/2020 in [facility] Cardiac and Pulmonary Rehab  Referring Provider Gollan      Encounter Date: 09/09/2020  Check In:  Session Check In - 09/09/20 0935      Check-In   Supervising physician immediately available to respond to emergencies See telemetry face sheet for immediately available ER MD    Location [facility]-Cardiac & Pulmonary Rehab    Staff Present Heath Lark, RN, BSN, CCRP;Joseph Hood RCP,RRT,BSRT;Kelly [place], [place], ACSM CEP, Exercise Physiologist    Virtual Visit No    Medication changes reported     No    Fall or balance concerns reported    No    Warm-up and Cool-down Performed on first and last piece of equipment    Resistance Training Performed Yes    VAD Patient? No    PAD/SET Patient? No      Pain Assessment   Currently in Pain? No/denies              Social History   Tobacco Use  Smoking Status Never Smoker  Smokeless Tobacco Never Used    Goals Met:  Proper associated with RPD/PD & O2 Sat Independence with exercise equipment Exercise tolerated well No report of cardiac concerns or symptoms  Goals Unmet:  Not Applicable  Comments: Pt able to follow exercise prescription today without complaint.  Will continue to monitor for progression.    Dr. Emily Filbert is Medical Director for [place] and [name].

## 2020-09-11 ENCOUNTER — Other Ambulatory Visit: Payer: Self-pay

## 2020-09-11 ENCOUNTER — Encounter: Payer: Self-pay | Admitting: *Deleted

## 2020-09-11 DIAGNOSIS — I5032 Chronic diastolic (congestive) heart failure: Secondary | ICD-10-CM

## 2020-09-11 NOTE — Progress Notes (Signed)
Daily Session Note  Patient Details  Name: JAHNIYA DUZAN MRN: [id number] Date of Birth: Aug 21, 1955 Referring Provider:   Flowsheet Row Pulmonary Rehab from 07/24/2020 in [facility] Card[facility] Pulmonary Rehab  Referring Provider Gollan      Encounter Date: 09/11/2020  Check In:  Session Check In - 09/11/20 0917      Check-In   Supervising physician immediately available to respond to emergencies See telemetry face sheet for immediately available ER MD    Location [facility]-Cardiac & Pulmonary Rehab    Staff Present Birdie Sons, MPA, Elveria Rising, BA, ACSM CEP, Exercise Physiologist;Joseph Tessie Fass RCP,RRT,BSRT    Virtual Visit No    Medication changes reported     No    Fall or balance concerns reported    No    Tobacco Cessation No Change    Warm-up and Cool-down Performed on first and last piece of equipment    Resistance Training Performed Yes    VAD Patient? No    PAD/SET Patient? No      Pain Assessment   Currently in Pain? No/denies              Social History   Tobacco Use  Smoking Status Never Smoker  Smokeless Tobacco Never Used    Goals Met:  Independence with exercise equipment Exercise tolerated well No report of cardiac concerns or symptoms Strength training completed today  Goals Unmet:  Not Applicable  Comments: Pt able to follow exercise prescription today without complaint.  Will continue to monitor for progression.    Dr. Emily Filbert is Medical Director for [place] and [name].

## 2020-09-11 NOTE — Progress Notes (Signed)
Pulmonary Individual Treatment Plan  Patient Details  Name: Kimberly Mccarthy MRN: [id number] Date of Birth: [birth year] Referring Provider:   Flowsheet Row Pulmonary Rehab from 07/24/2020 in [facility] Cardiac and Pulmonary Rehab  Referring Provider Gollan      Initial Encounter Date:  Flowsheet Row Pulmonary Rehab from 07/24/2020 in [facility] Cardiac and Pulmonary Rehab  Date 07/24/20      Visit Diagnosis: Heart failure, diastolic, chronic (HCC)  Patient's Home Medications on Admission:  Current Outpatient Medications:  .  FEROSUL 325 (65 Fe) MG tablet, TAKE 1 TABLET BY MOUTH ONE TIME A DAY EVERY MONDAY, WEDNESDAY, AND FRIDAY FOR IRON SUPPLEMENT, Disp: 12 tablet, Rfl: 6 .  amLODipine (NORVASC) 10 MG tablet, Take 1 tablet (10 mg total) by mouth daily., Disp: 30 tablet, Rfl: 5 .  ASPIRIN LOW DOSE 81 MG EC tablet, TAKE 1 TABLET BY MOUTH ONE TIME A DAY FOR CVA, Disp: 30 tablet, Rfl: 5 .  atorvastatin (LIPITOR) 80 MG tablet, TAKE 1 TABLET BY MOUTH DAILY, Disp: 30 tablet, Rfl: 5 .  carvedilol (COREG) 25 MG tablet, Take 1 tablet (25 mg total) by mouth 2 (two) times daily with a meal., Disp: 60 tablet, Rfl: 5 .  clopidogrel (PLAVIX) 75 MG tablet, Take 1 tablet (75 mg total) by mouth daily., Disp: 30 tablet, Rfl: 5 .  ezetimibe (ZETIA) 10 MG tablet, TAKE 1 TABLET BY MOUTH DAILY, Disp: 30 tablet, Rfl: 5 .  famotidine (PEPCID) 20 MG tablet, TAKE 1 TABLET BY MOUTH TWICE A DAY RELATED TO GASTROESOPHAGEAL REFLUX DISEASE, Disp: 60 tablet, Rfl: 6 .  gabapentin (NEURONTIN) 300 MG capsule, TAKE 1 CAPSULE BY MOUTH 3 TIMES A DAY FOR PAIN. DO NOT CRUSH, Disp: 90 capsule, Rfl: 3 .  hydrALAZINE (APRESOLINE) 100 MG tablet, TAKE 1 TABLET BY MOUTH IN THE MORNING, AT NOON AND AT BEDTIME, Disp: 90 tablet, Rfl: 5 .  JARDIANCE 10 MG TABS tablet, TAKE 1 TABLET BY MOUTH DAILY, Disp: 30 tablet, Rfl: 2 .  levothyroxine (SYNTHROID) 50 MCG tablet, Take 50 mcg by mouth daily before breakfast., Disp: , Rfl:  .  losartan (COZAAR) 50  MG tablet, Take 1 tablet (50 mg total) by mouth daily., Disp: 90 tablet, Rfl: 1 .  Multiple Vitamin (MULTIVITAMIN ADULT PO), Take by mouth daily., Disp: , Rfl:  .  oxyCODONE (OXYCONTIN) 10 mg 12 hr tablet, Take 10 mg by mouth every 12 (twelve) hours. , Disp: , Rfl:  .  pantoprazole (PROTONIX) 40 MG tablet, Take 40 mg by mouth 2 (two) times daily., Disp: , Rfl:  .  polyethylene glycol (MIRALAX / GLYCOLAX) 17 g packet, Take 17 g by mouth daily., Disp: 14 each, Rfl: 0 .  senna (SENOKOT) 8.6 MG tablet, Take 1 tablet by mouth daily., Disp: , Rfl:  .  Vitamin D, Ergocalciferol, (DRISDOL) 1.25 MG (50000 UNIT) CAPS capsule, Take 50,000 Units by mouth once a week., Disp: , Rfl:   Past Medical History: Past Medical History:  Diagnosis Date  . Anemia   . AVM (arteriovenous malformation)   . Bronchitis   . Carotid artery disease (HCC) 10/31/2015  . Chronic respiratory failure (HCC)   . COPD (chronic obstructive pulmonary disease) (HCC)   . Critical ischemia of lower extremity (HCC) 07/18/2014  . H/O blood clots   . Hyperkalemia   . Hypertension   . Hypothyroidism 09/05/2014  . Morbid obesity (HCC)   . PVD (peripheral vascular disease) (HCC)   . Renal failure   . Stroke [facility])  may 2015  . Thyroid disease     Tobacco Use: Social History   Tobacco Use  Smoking Status Never Smoker  Smokeless Tobacco Never Used    Labs: Recent Review Flowsheet Data    Labs for ITP Cardiac and Pulmonary Rehab Latest Ref Rng & Units 02/14/2020 02/15/2020 02/15/2020 02/16/2020 07/15/2020   Cholestrol 100 - 199 mg/dL - - 409(W) - 119   LDLCALC 0 - 99 mg/dL - - 147(W) - 90   HDL >29 mg/dL - - 41 - 47   Trlycerides 0 - 149 mg/dL 562 130(Q) 657(Q) 469 77   Hemoglobin A1c 4.8 - 5.6 % - - - - -   PHART 7.350 - 7.450 - - - - -   PCO2ART 32.0 - 48.0 mmHg - - - - -   HCO3 20.0 - 28.0 mmol/L - - - - -   ACIDBASEDEF 0.0 - 2.0 mmol/L - - - - -   O2SAT % - - - - -       Pulmonary Assessment Scores:  Pulmonary  Assessment Scores    Row Name 07/24/20 1626         ADL UCSD   SOB Score total 14     Rest 0     Walk 1     Stairs 2     Bath 0     Dress 0     Shop 0           CAT Score   CAT Score 6           mMRC Score   mMRC Score 2            UCSD: Self-administered rating of dyspnea associated with activities of daily living (ADLs) 6-point scale (0 = "not at all" to 5 = "maximal or unable to do because of breathlessness")  Scoring Scores range from 0 to 120.  Minimally important difference is 5 units  CAT: CAT can identify the health impairment of COPD patients and is better correlated with disease progression.  CAT has a scoring range of zero to 40. The CAT score is classified into four groups of low (less than 10), medium (10 - 20), high (21-30) and very high (31-40) based on the impact level of disease on health status. A CAT score over 10 suggests significant symptoms.  A worsening CAT score could be explained by an exacerbation, poor medication adherence, poor inhaler technique, or progression of COPD or comorbid conditions.  CAT MCID is 2 points  mMRC: mMRC (Modified Medical Research Council) Dyspnea Scale is used to assess the degree of baseline functional disability in patients of respiratory disease due to dyspnea. No minimal important difference is established. A decrease in score of 1 point or greater is considered a positive change.   Pulmonary Function Assessment:   Exercise Target Goals: Exercise Program Goal: Individual exercise prescription set using results from initial 6 min walk test and THRR while considering  patient's activity barriers and safety.   Exercise Prescription Goal: Initial exercise prescription builds to 30-45 minutes a day of aerobic activity, 2-3 days per week.  Home exercise guidelines will be given to patient during program as part of exercise prescription that the participant will acknowledge.  Education: Aerobic Exercise: - Group verbal  and visual presentation on the components of exercise prescription. Introduces F.I.T.T principle from ACSM for exercise prescriptions.  Reviews F.I.T.T. principles of aerobic exercise including progression. Written material given at graduation. Flowsheet Row Pulmonary Rehab  from 09/04/2020 in [facility] Cardiac and Pulmonary Rehab  Date 08/14/20  Educator AS  Instruction Review Code 1- Verbalizes Understanding      Education: Resistance Exercise: - Group verbal and visual presentation on the components of exercise prescription. Introduces F.I.T.T principle from ACSM for exercise prescriptions  Reviews F.I.T.T. principles of resistance exercise including progression. Written material given at graduation. Flowsheet Row Pulmonary Rehab from 09/04/2020 in [facility] Cardiac and Pulmonary Rehab  Date 08/21/20  Educator AS  Instruction Review Code 1- Verbalizes Understanding       Education: Exercise & Equipment Safety: - Individual verbal instruction and demonstration of equipment use and safety with use of the equipment. Flowsheet Row Pulmonary Rehab from 09/04/2020 in [facility] Cardiac and Pulmonary Rehab  Date 07/24/20  Educator AS  Instruction Review Code 1- Verbalizes Understanding      Education: Exercise Physiology & General Exercise Guidelines: - Group verbal and written instruction with models to review the exercise physiology of the cardiovascular system and associated critical values. Provides general exercise guidelines with specific guidelines to those with heart or lung disease.    Education: Flexibility, Balance, Mind/Body Relaxation: - Group verbal and visual presentation with interactive activity on the components of exercise prescription. Introduces F.I.T.T principle from ACSM for exercise prescriptions. Reviews F.I.T.T. principles of flexibility and balance exercise training including progression. Also discusses the mind body connection.  Reviews various relaxation techniques to help reduce  and manage stress (i.e. Deep breathing, progressive muscle relaxation, and visualization). Balance handout provided to take home. Written material given at graduation. Flowsheet Row Pulmonary Rehab from 09/04/2020 in [facility] Cardiac and Pulmonary Rehab  Date 08/28/20  Educator AS  Instruction Review Code 1- Verbalizes Understanding      Activity Barriers & Risk Stratification:  Activity Barriers & Cardiac Risk Stratification - 07/19/20 1003      Activity Barriers & Cardiac Risk Stratification   Activity Barriers Arthritis;Joint Problems;Muscular Weakness;Balance Concerns;Assistive Device   cane and walker          6 Minute Walk:  6 Minute Walk    Row Name 07/24/20 1615         6 Minute Walk   Distance 415 feet     Walk Time 4.5 minutes     # of Rest Breaks 3     MPH 1.04     METS 0.97     RPE 15     Perceived Dyspnea  1     VO2 Peak 3.4     Symptoms Yes (comment)     Comments legs hurt 10/10     Resting HR 61 bpm     Resting BP 152/78     Resting Oxygen Saturation  95 %     Exercise Oxygen Saturation  during 6 min walk 88 %     Max Ex. HR 91 bpm     Max Ex. BP 160/68     2 Minute Post BP 144/66           Interval HR   1 Minute HR 87     2 Minute HR 84     3 Minute HR 88     4 Minute HR 91     5 Minute HR 85     6 Minute HR 91     2 Minute Post HR 72     Interval Heart Rate? Yes           Interval Oxygen   Interval Oxygen? Yes  Baseline Oxygen Saturation % 95 %     1 Minute Oxygen Saturation % 90 %     1 Minute Liters of Oxygen 0 L     2 Minute Oxygen Saturation % 90 %     2 Minute Liters of Oxygen 0 L     3 Minute Oxygen Saturation % 91 %     3 Minute Liters of Oxygen 0 L     4 Minute Oxygen Saturation % 91 %     4 Minute Liters of Oxygen 0 L     5 Minute Oxygen Saturation % 89 %     5 Minute Liters of Oxygen 0 L     6 Minute Oxygen Saturation % 88 %     6 Minute Liters of Oxygen 0 L     2 Minute Post Oxygen Saturation % 95 %     2 Minute Post  Liters of Oxygen 0 L           Oxygen Initial Assessment:  Oxygen Initial Assessment - 07/19/20 1004      Home Oxygen   Home Oxygen Device None    Sleep Oxygen Prescription None    Home Exercise Oxygen Prescription None    Home Resting Oxygen Prescription None      Initial 6 min Walk   Oxygen Used None      Program Oxygen Prescription   Program Oxygen Prescription None      Intervention   Short Term Goals To learn and understand importance of maintaining oxygen saturations>88%;To learn and understand importance of monitoring SPO2 with pulse oximeter and demonstrate accurate use of the pulse oximeter.;To learn and demonstrate proper pursed lip breathing techniques or other breathing techniques.;To learn and demonstrate proper use of respiratory medications    Long  Term Goals Verbalizes importance of monitoring SPO2 with pulse oximeter and return demonstration;Maintenance of O2 saturations>88%;Exhibits proper breathing techniques, such as pursed lip breathing or other method taught during program session;Compliance with respiratory medication;Demonstrates proper use of MDI's           Oxygen Re-Evaluation:  Oxygen Re-Evaluation    Row Name 08/05/20 1147 08/30/20 0927           Program Oxygen Prescription   Program Oxygen Prescription None None             Home Oxygen   Home Oxygen Device None None      Sleep Oxygen Prescription None None      Home Exercise Oxygen Prescription None None      Home Resting Oxygen Prescription None None      Compliance with Home Oxygen Use -- Yes             Goals/Expected Outcomes   Short Term Goals To learn and understand importance of maintaining oxygen saturations>88%;To learn and understand importance of monitoring SPO2 with pulse oximeter and demonstrate accurate use of the pulse oximeter.;To learn and demonstrate proper pursed lip breathing techniques or other breathing techniques.;To learn and demonstrate proper use of respiratory  medications To learn and understand importance of maintaining oxygen saturations>88%      Long  Term Goals Verbalizes importance of monitoring SPO2 with pulse oximeter and return demonstration;Maintenance of O2 saturations>88%;Exhibits proper breathing techniques, such as pursed lip breathing or other method taught during program session;Compliance with respiratory medication;Demonstrates proper use of MDI's Maintenance of O2 saturations>88%      Comments Reviewed PLB technique with pt.  Talked about how it works and  it's importance in maintaining their exercise saturations. She has a pulse oximeter to check her oxygen saturation at home. Informed her  and explained why it is important to have one. Reviewed that oxygen saturations should be 88 percent and above. She states that her oxygen at home is in the mid 90s. Naturi works on PLB at home and has no questions on how to do so.      Goals/Expected Outcomes Short: Become more profiecient at using PLB.   Long: Become independent at using PLB. Short: monitor oxygen at home with exertion. Long: maintain oxygen saturations above 88 percent independently.             Oxygen Discharge (Final Oxygen Re-Evaluation):  Oxygen Re-Evaluation - 08/30/20 0927      Program Oxygen Prescription   Program Oxygen Prescription None      Home Oxygen   Home Oxygen Device None    Sleep Oxygen Prescription None    Home Exercise Oxygen Prescription None    Home Resting Oxygen Prescription None    Compliance with Home Oxygen Use Yes      Goals/Expected Outcomes   Short Term Goals To learn and understand importance of maintaining oxygen saturations>88%    Long  Term Goals Maintenance of O2 saturations>88%    Comments She has a pulse oximeter to check her oxygen saturation at home. Informed her  and explained why it is important to have one. Reviewed that oxygen saturations should be 88 percent and above. She states that her oxygen at home is in the mid 90s. Quinci  works on PLB at home and has no questions on how to do so.    Goals/Expected Outcomes Short: monitor oxygen at home with exertion. Long: maintain oxygen saturations above 88 percent independently.           Initial Exercise Prescription:  Initial Exercise Prescription - 07/24/20 1600      Date of Initial Exercise RX and Referring Provider   Date 07/24/20    Referring Provider Gollan      Treadmill   MPH 1    Grade 0    Minutes 15    METs 1      Recumbant Bike   Level 1    RPM 60    Minutes 15    METs 1      NuStep   Level 1    SPM 80    Minutes 15    METs 1      Arm Ergometer   Level 1    RPM 25    Minutes 15    METs 1      Recumbant Elliptical   Level 1    RPM 50    Minutes 15    METs 1      Biostep-RELP   Level 1    SPM 50    Minutes 15    METs 1      Prescription Details   Frequency (times per week) 3    Duration Progress to 30 minutes of continuous aerobic without signs/symptoms of physical distress      Intensity   THRR 40-80% of Max Heartrate 99-137    Ratings of Perceived Exertion 11-13    Perceived Dyspnea 0-4      Resistance Training   Training Prescription Yes    Weight 3 lb    Reps 10-15           Perform Capillary Blood Glucose checks as needed.  Exercise Prescription Changes:  Exercise Prescription Changes    Row Name 07/24/20 1600 08/14/20 1700 08/28/20 0800 09/04/20 0900       Response to Exercise   Blood Pressure (Admit) 152/78 142/80 146/74 --    Blood Pressure (Exercise) 160/68 124/82 144/66 --    Blood Pressure (Exit) 144/66 130/82 110/60 --    Heart Rate (Admit) 61 bpm 79 bpm 72 bpm --    Heart Rate (Exercise) 91 bpm 90 bpm 76 bpm --    Heart Rate (Exit) 72 bpm 69 bpm 64 bpm --    Oxygen Saturation (Admit) 95 % 93 % 98 % --    Oxygen Saturation (Exercise) 88 % 88 % 90 % --    Oxygen Saturation (Exit) 95 % 92 % 98 % --    Rating of Perceived Exertion (Exercise) --    Perceived Dyspnea (Exercise) 1 -- 3  --    Symptoms hips hurt second day -- --    Duration -- Progress to 30 minutes of  aerobic without signs/symptoms of physical distress Progress to 30 minutes of  aerobic without signs/symptoms of physical distress --    Intensity -- THRR unchanged THRR unchanged --         Progression   Progression -- Continue to progress workloads to maintain intensity without signs/symptoms of physical distress. Continue to progress workloads to maintain intensity without signs/symptoms of physical distress. --    Average METs -- 1.7 1.85 --         Resistance Training   Training Prescription -- Yes Yes --    Weight -- 3 lb 3 lb --    Reps -- 10-15 10-15 --         Treadmill   MPH -- 1 -- --    Grade -- 0 -- --    Minutes -- 15 -- --    METs -- 1.77 -- --         NuStep   Level -- -- 1 --    Minutes -- -- 15 --    METs -- -- 1.7 --         T5 Nustep   Level -- 1 -- --    Minutes -- 15 -- --         Biostep-RELP   Level -- -- 1 --    Minutes -- -- 15 --    METs -- -- 2 --         Home Exercise Plan   Plans to continue exercise at -- -- -- Home (comment)  walking    Frequency -- -- -- Add 2 additional days to program exercise sessions.    Initial Home Exercises Provided -- -- -- 09/04/20           Exercise Comments:   Exercise Goals and Review:  Exercise Goals    Row Name 07/24/20 1625             Exercise Goals   Increase Physical Activity Yes       Intervention Provide advice, education, support and counseling about physical activity/exercise needs.;Develop an individualized exercise prescription for aerobic and resistive training based on initial evaluation findings, risk stratification, comorbidities and participant's personal goals.       Expected Outcomes Short Term: Attend rehab on a regular basis to increase amount of physical activity.;Long Term: Add in home exercise to make exercise part of routine and to increase amount of physical activity.;Long Term:  Exercising regularly at least 3-5 days a week.       Increase Strength and Stamina Yes       Intervention Provide advice, education, support and counseling about physical activity/exercise needs.;Develop an individualized exercise prescription for aerobic and resistive training based on initial evaluation findings, risk stratification, comorbidities and participant's personal goals.       Expected Outcomes Short Term: Increase workloads from initial exercise prescription for resistance, speed, and METs.;Short Term: Perform resistance training exercises routinely during rehab and add in resistance training at home;Long Term: Improve cardiorespiratory fitness, muscular endurance and strength as measured by increased METs and functional capacity (6MWT)       Able to understand and use rate of perceived exertion (RPE) scale Yes       Intervention Provide education and explanation on how to use RPE scale       Expected Outcomes Short Term: Able to use RPE daily in rehab to express subjective intensity level;Long Term:  Able to use RPE to guide intensity level when exercising independently       Able to understand and use Dyspnea scale Yes       Intervention Provide education and explanation on how to use Dyspnea scale       Expected Outcomes Short Term: Able to use Dyspnea scale daily in rehab to express subjective sense of shortness of breath during exertion;Long Term: Able to use Dyspnea scale to guide intensity level when exercising independently       Knowledge and understanding of Target Heart Rate Range (THRR) Yes       Intervention Provide education and explanation of THRR including how the numbers were predicted and where they are located for reference       Expected Outcomes Short Term: Able to state/look up THRR;Short Term: Able to use daily as guideline for intensity in rehab;Long Term: Able to use THRR to govern intensity when exercising independently       Able to check pulse independently Yes        Intervention Provide education and demonstration on how to check pulse in carotid and radial arteries.;Review the importance of being able to check your own pulse for safety during independent exercise       Expected Outcomes Short Term: Able to explain why pulse checking is important during independent exercise;Long Term: Able to check pulse independently and accurately       Understanding of Exercise Prescription Yes       Intervention Provide education, explanation, and written materials on patient's individual exercise prescription       Expected Outcomes Short Term: Able to explain program exercise prescription;Long Term: Able to explain home exercise prescription to exercise independently              Exercise Goals Re-Evaluation :  Exercise Goals Re-Evaluation    Row Name 08/05/20 1146 08/14/20 1742 08/28/20 0834 09/04/20 0930       Exercise Goal Re-Evaluation   Exercise Goals Review Increase Physical Activity;Able to understand and use rate of perceived exertion (RPE) scale;Understanding of Exercise Prescription;Increase Strength and Stamina;Able to check pulse independently Increase Physical Activity;Increase Strength and Stamina Increase Physical Activity;Increase Strength and Stamina Increase Physical Activity;Increase Strength and Stamina;Able to understand and use rate of perceived exertion (RPE) scale;Able to understand and use Dyspnea scale;Knowledge and understanding of Target Heart Rate Range (THRR);Able to check pulse independently;Understanding of Exercise Prescription    Comments Reviewed RPE and dyspnea scales, THR and program prescription with pt today.  Pt voiced understanding and was given a copy of goals to take home. Aleira rated the TM a 20 for RPE.  She is just starting to exercise so staff wil encourage her to rest if needed and build up to 15 min. Pa tried walking the track.  She still reported RPE of 17.  Staff will monitor progress. Reviewed home exercise  with pt today.  Pt plans to walk at home for exercise.  Reviewed THR, pulse, RPE, sign and symptoms, pulse oximetery and when to call 911 or MD.  Also discussed weather considerations and indoor options.  Pt voiced understanding.    Expected Outcomes Short: Use RPE daily to regulate intensity. Long: Follow program prescription in THR. Short: work towards 15 min on TM Long : build overall stamina Short:  continue to work on walking  Long:  be able to walk 15 minutes Short:  continue to work on walking  Long:  be able to walk 15 minutes           Discharge Exercise Prescription (Final Exercise Prescription Changes):  Exercise Prescription Changes - 09/04/20 0900      Home Exercise Plan   Plans to continue exercise at Home (comment)   walking   Frequency Add 2 additional days to program exercise sessions.    Initial Home Exercises Provided 09/04/20           Nutrition:  Target Goals: Understanding of nutrition guidelines, daily intake of sodium 1500mg , cholesterol 200mg , calories 30% from fat and 7% or less from saturated fats, daily to have 5 or more servings of fruits and vegetables.  Education: All About Nutrition: -Group instruction provided by verbal, written material, interactive activities, discussions, models, and posters to present general guidelines for heart healthy nutrition including fat, fiber, MyPlate, the role of sodium in heart healthy nutrition, utilization of the nutrition label, and utilization of this knowledge for meal planning. Follow up email sent as well. Written material given at graduation. Flowsheet Row Pulmonary Rehab from 09/04/2020 in [facility] Cardiac and Pulmonary Rehab  Date 09/04/20  Educator [facility]  Instruction Review Code 1- Verbalizes Understanding      Biometrics:  Pre Biometrics - 07/24/20 1625      Pre Biometrics   Height 5\' 6"  (1.676 m)    Weight 258 lb (117 kg)    BMI (Calculated) 41.66            Nutrition Therapy Plan and Nutrition  Goals:  Nutrition Therapy & Goals - 09/02/20 1019      Nutrition Therapy   Diet Heart healthy, low Na    Drug/Food Interactions Statins/Certain Fruits    Protein (specify units) 94g    Fiber 25 grams    Whole Grain Foods 3 servings    Saturated Fats 12 max. grams    Fruits and Vegetables 8 servings/day    Sodium 1.5 grams      Personal Nutrition Goals   Nutrition Goal ST: swap out some sweets with recipes given (like banana "nice" cream) LT: limit sweets to 2-3x/week    Comments She is already reducing her sodium choosing no salt added items. She uses olive oil. She likes vegetables and fruit. She has no salt added canned foods and she cooks beans from dried. She feels that she has peanut butter and chocolate cookies and icecream - she has got back on track now. She feels her biggest problem is sweets. She feels like she knows what she needs to do. Discussed heart healthy  eating.      Intervention Plan   Intervention Prescribe, educate and counsel regarding individualized specific dietary modifications aiming towards targeted core components such as weight, hypertension, lipid management, diabetes, heart failure and other comorbidities.;Nutrition handout(s) given to patient.    Expected Outcomes Short Term Goal: Understand basic principles of dietary content, such as calories, fat, sodium, cholesterol and nutrients.;Short Term Goal: A plan has been developed with personal nutrition goals set during dietitian appointment.;Long Term Goal: Adherence to prescribed nutrition plan.           Nutrition Assessments:  MEDIFICTS Score Key:  ?70 Need to make dietary changes   40-70 Heart Healthy Diet  ? 40 Therapeutic Level Cholesterol Diet  Flowsheet Row Pulmonary Rehab from 07/24/2020 in [facility] Cardiac and Pulmonary Rehab  Picture Your Plate Total Score on Admission 58     Picture Your Plate Scores:  <32 Unhealthy dietary pattern with much room for improvement.  41-50 Dietary pattern  unlikely to meet recommendations for good health and room for improvement.  51-60 More healthful dietary pattern, with some room for improvement.   >60 Healthy dietary pattern, although there may be some specific behaviors that could be improved.   Nutrition Goals Re-Evaluation:  Nutrition Goals Re-Evaluation    Row Name 08/30/20 0931             Goals   Current Weight 256 lb (116.1 kg)       Nutrition Goal Meet with the dietician       Comment Kyrstyn loves potato chips and wants to work on intaking less. She is meeting with the dietician on Monday.       Expected Outcome Short: meet with the dietician. Long: maintain a diet that pertains to her needs.              Nutrition Goals Discharge (Final Nutrition Goals Re-Evaluation):  Nutrition Goals Re-Evaluation - 08/30/20 0931      Goals   Current Weight 256 lb (116.1 kg)    Nutrition Goal Meet with the dietician    Comment Briselda loves potato chips and wants to work on intaking less. She is meeting with the dietician on Monday.    Expected Outcome Short: meet with the dietician. Long: maintain a diet that pertains to her needs.           Psychosocial: Target Goals: Acknowledge presence or absence of significant depression and/or stress, maximize coping skills, provide positive support system. Participant is able to verbalize types and ability to use techniques and skills needed for reducing stress and depression.   Education: Stress, Anxiety, and Depression - Group verbal and visual presentation to define topics covered.  Reviews how body is impacted by stress, anxiety, and depression.  Also discusses healthy ways to reduce stress and to treat/manage anxiety and depression.  Written material given at graduation.   Education: Sleep Hygiene -Provides group verbal and written instruction about how sleep can affect your health.  Define sleep hygiene, discuss sleep cycles and impact of sleep habits. Review good sleep hygiene  tips.    Initial Review & Psychosocial Screening:  Initial Psych Review & Screening - 07/19/20 1006      Initial Review   Current issues with Current Stress Concerns    Source of Stress Concerns Unable to participate in former interests or hobbies;Unable to perform yard/household activities;Chronic Illness      Family Dynamics   Good Support System? Yes   sister; family     Barriers  Psychosocial barriers to participate in program There are no identifiable barriers or psychosocial needs.;The patient should benefit from training in stress management and relaxation.      Screening Interventions   Interventions Encouraged to exercise;To provide support and resources with identified psychosocial needs;Provide feedback about the scores to participant    Expected Outcomes Short Term goal: Utilizing psychosocial counselor, staff and physician to assist with identification of specific Stressors or current issues interfering with healing process. Setting desired goal for each stressor or current issue identified.;Long Term Goal: Stressors or current issues are controlled or eliminated.;Short Term goal: Identification and review with participant of any Quality of Life or Depression concerns found by scoring the questionnaire.;Long Term goal: The participant improves quality of Life and PHQ9 Scores as seen by post scores and/or verbalization of changes           Quality of Life Scores:  Scores of 19 and below usually indicate a poorer quality of life in these areas.  A difference of  2-3 points is a clinically meaningful difference.  A difference of 2-3 points in the total score of the Quality of Life Index has been associated with significant improvement in overall quality of life, self-image, physical symptoms, and general health in studies assessing change in quality of life.  PHQ-9: Recent Review Flowsheet Data    Depression screen [facility] 2/9 07/24/2020 08/30/2015 08/01/2015 07/02/2015 06/06/2015    Decreased Interest 0 0 0 0 0   Down, Depressed, Hopeless 0 0 0 0 0   PHQ - 2 Score 0 0 0 0 0   Altered sleeping 0 - - - -   Tired, decreased energy 0 - - - -   Change in appetite 0 - - - -   Feeling bad or failure about yourself  0 - - - -   Trouble concentrating 0 - - - -   Moving slowly or fidgety/restless 0 - - - -   Suicidal thoughts 0 - - - -   PHQ-9 Score 0 - - - -     Interpretation of Total Score  Total Score Depression Severity:  1-4 = Minimal depression, 5-9 = Mild depression, 10-14 = Moderate depression, 15-19 = Moderately severe depression, 20-27 = Severe depression   Psychosocial Evaluation and Intervention:  Psychosocial Evaluation - 07/19/20 1011      Psychosocial Evaluation & Interventions   Interventions Encouraged to exercise with the program and follow exercise prescription    Comments Ms. Subia is coming to Pulmonary Rehab with diastolic heart failure. She states her breathing is her main source of stress and she is tired of feeling short of breath. Her family is her main support system and lives close by to help with things. She did get referred in December but wanted to wait until it got warmer to start pulmonary rehab, in the meantime she had been working on her diet and has lost some weight which helps her feel better. She didn't report any other stressors and states she just takes it day by day.    Expected Outcomes Short: attend Pulmonary Rehab for education and exercise. Long: develop positive self care habits.    Continue Psychosocial Services  Follow up required by staff           Psychosocial Re-Evaluation:  Psychosocial Re-Evaluation    Row Name 08/30/20 [phone]             Psychosocial Re-Evaluation   Current issues with Current Depression  Comments If Jolanta stays in the house to long she feels like she gets depressed. She goes to the store and back to the house mostly. Exercise is helping her get out of the house and helps with her stress.        Expected Outcomes Short: Continue to exercise regularly to support mental health and notify staff of any changes. Long: maintain mental health and well being through teaching of rehab or prescribed medications independently.       Interventions Encouraged to attend Pulmonary Rehabilitation for the exercise       Continue Psychosocial Services  Follow up required by staff              Psychosocial Discharge (Final Psychosocial Re-Evaluation):  Psychosocial Re-Evaluation - 08/30/20 0933      Psychosocial Re-Evaluation   Current issues with Current Depression    Comments If Kenneshia stays in the house to long she feels like she gets depressed. She goes to the store and back to the house mostly. Exercise is helping her get out of the house and helps with her stress.    Expected Outcomes Short: Continue to exercise regularly to support mental health and notify staff of any changes. Long: maintain mental health and well being through teaching of rehab or prescribed medications independently.    Interventions Encouraged to attend Pulmonary Rehabilitation for the exercise    Continue Psychosocial Services  Follow up required by staff           Education: Education Goals: Education classes will be provided on a weekly basis, covering required topics. Participant will state understanding/return demonstration of topics presented.  Learning Barriers/Preferences:  Learning Barriers/Preferences - 07/19/20 1014      Learning Barriers/Preferences   Learning Barriers None    Learning Preferences None           General Pulmonary Education Topics:  Infection Prevention: - Provides verbal and written material to individual with discussion of infection control including proper hand washing and proper equipment cleaning during exercise session. Flowsheet Row Pulmonary Rehab from 09/04/2020 in [facility] Cardiac and Pulmonary Rehab  Date 07/24/20  Educator AS  Instruction Review Code 1- Verbalizes  Understanding      Falls Prevention: - Provides verbal and written material to individual with discussion of falls prevention and safety. Flowsheet Row Pulmonary Rehab from 09/04/2020 in [facility] Cardiac and Pulmonary Rehab  Date 07/24/20  Educator AS  Instruction Review Code 1- Verbalizes Understanding      Chronic Lung Disease Review: - Group verbal instruction with posters, models, PowerPoint presentations and videos,  to review new updates, new respiratory medications, new advancements in procedures and treatments. Providing information on websites and "800" numbers for continued self-education. Includes information about supplement oxygen, available portable oxygen systems, continuous and intermittent flow rates, oxygen safety, concentrators, and Medicare reimbursement for oxygen. Explanation of Pulmonary Drugs, including class, frequency, complications, importance of spacers, rinsing mouth after steroid MDI's, and proper cleaning methods for nebulizers. Review of basic lung anatomy and physiology related to function, structure, and complications of lung disease. Review of risk factors. Discussion about methods for diagnosing sleep apnea and types of masks and machines for OSA. Includes a review of the use of types of environmental controls: home humidity, furnaces, filters, dust mite/pet prevention, HEPA vacuums. Discussion about weather changes, air quality and the benefits of nasal washing. Instruction on Warning signs, infection symptoms, calling MD promptly, preventive modes, and value of vaccinations. Review of effective airway clearance, coughing and/or  vibration techniques. Emphasizing that all should Create an Action Plan. Written material given at graduation.   AED/CPR: - Group verbal and written instruction with the use of models to demonstrate the basic use of the AED with the basic ABC's of resuscitation.    Anatomy and Cardiac Procedures: - Group verbal and visual presentation  and models provide information about basic cardiac anatomy and function. Reviews the testing methods done to diagnose heart disease and the outcomes of the test results. Describes the treatment choices: Medical Management, Angioplasty, or Coronary Bypass Surgery for treating various heart conditions including Myocardial Infarction, Angina, Valve Disease, and Cardiac Arrhythmias.  Written material given at graduation. Flowsheet Row Pulmonary Rehab from 09/04/2020 in [facility] Cardiac and Pulmonary Rehab  Date 08/21/20  Educator SB  Instruction Review Code 1- Verbalizes Understanding      Medication Safety: - Group verbal and visual instruction to review commonly prescribed medications for heart and lung disease. Reviews the medication, class of the drug, and side effects. Includes the steps to properly store meds and maintain the prescription regimen.  Written material given at graduation.   Other: -Provides group and verbal instruction on various topics (see comments)   Knowledge Questionnaire Score:  Knowledge Questionnaire Score - 07/24/20 1628      Knowledge Questionnaire Score   Pre Score 15/18  ADL, oxygen            Core Components/Risk Factors/Patient Goals at Admission:  Personal Goals and Risk Factors at Admission - 07/24/20 1630      Core Components/Risk Factors/Patient Goals on Admission    Weight Management Yes;Weight Loss    Intervention Weight Management: Develop a combined nutrition and exercise program designed to reach desired caloric intake, while maintaining appropriate intake of nutrient and fiber, sodium and fats, and appropriate energy expenditure required for the weight goal.;Weight Management/Obesity: Establish reasonable short term and long term weight goals.    Admit Weight 258 lb (117 kg)    Goal Weight: Short Term 250 lb (113.4 kg)    Goal Weight: Long Term 240 lb (108.9 kg)    Expected Outcomes Short Term: Continue to assess and modify interventions until  short term weight is achieved;Long Term: Adherence to nutrition and physical activity/exercise program aimed toward attainment of established weight goal;Weight Maintenance: Understanding of the daily nutrition guidelines, which includes 25-35% calories from fat, 7% or less cal from saturated fats, less than  cholesterol, less than 1.5gm of sodium, & 5 or more servings of fruits and vegetables daily;Weight Loss: Understanding of general recommendations for a balanced deficit meal plan, which promotes 1-2 lb weight loss per week and includes a negative energy balance of [phone] kcal/d;Understanding recommendations for meals to include 15-35% energy as protein, 25-35% energy from fat, 35-60% energy from carbohydrates, less than  of dietary cholesterol, 20-35 gm of total fiber daily;Understanding of distribution of calorie intake throughout the day with the consumption of 4-5 meals/snacks    Heart Failure Yes    Intervention Provide a combined exercise and nutrition program that is supplemented with education, support and counseling about heart failure. Directed toward relieving symptoms such as shortness of breath, decreased exercise tolerance, and extremity edema.    Expected Outcomes Improve functional capacity of life;Short term: Attendance in program 2-3 days a week with increased exercise capacity. Reported lower sodium intake. Reported increased fruit and vegetable intake. Reports medication compliance.;Short term: Daily weights obtained and reported for increase. Utilizing diuretic protocols set by physician.;Long term: Adoption of self-care skills and  reduction of barriers for early signs and symptoms recognition and intervention leading to self-care maintenance.    Hypertension Yes    Intervention Provide education on lifestyle modifcations including regular physical activity/exercise, weight management, moderate sodium restriction and increased consumption of fresh fruit, vegetables, and low  fat dairy, alcohol moderation, and smoking cessation.;Monitor prescription use compliance.    Expected Outcomes Short Term: Continued assessment and intervention until BP is < 140/3mm HG in hypertensive participants. < 130/7mm HG in hypertensive participants with diabetes, heart failure or chronic kidney disease.;Long Term: Maintenance of blood pressure at goal levels.           Education:Diabetes - Individual verbal and written instruction to review signs/symptoms of diabetes, desired ranges of glucose level fasting, after meals and with exercise. Acknowledge that pre and post exercise glucose checks will be done for 3 sessions at entry of program.   Know Your Numbers and Heart Failure: - Group verbal and visual instruction to discuss disease risk factors for cardiac and pulmonary disease and treatment options.  Reviews associated critical values for Overweight/Obesity, Hypertension, Cholesterol, and Diabetes.  Discusses basics of heart failure: signs/symptoms and treatments.  Introduces Heart Failure Zone chart for action plan for heart failure.  Written material given at graduation.   Core Components/Risk Factors/Patient Goals Review:   Goals and Risk Factor Review    Row Name 08/30/20 0930             Core Components/Risk Factors/Patient Goals Review   Personal Goals Review Improve shortness of breath with ADL's       Review Spoke to patient about their shortness of breath and what they can do to improve. Patient has been informed of breathing techniques when starting the program. Patient is informed to tell staff if they have had any med changes and that certain meds they are taking or not taking can be causing shortness of breath. She is able to make her bed and shower independently. She is making her own food and when she feels tired she will sit until she recovers.       Expected Outcomes Short: Attend [name] regularly to improve shortness of breath with ADL's. Long: maintain  independence with ADL's              Core Components/Risk Factors/Patient Goals at Discharge (Final Review):   Goals and Risk Factor Review - 08/30/20 0930      Core Components/Risk Factors/Patient Goals Review   Personal Goals Review Improve shortness of breath with ADL's    Review Spoke to patient about their shortness of breath and what they can do to improve. Patient has been informed of breathing techniques when starting the program. Patient is informed to tell staff if they have had any med changes and that certain meds they are taking or not taking can be causing shortness of breath. She is able to make her bed and shower independently. She is making her own food and when she feels tired she will sit until she recovers.    Expected Outcomes Short: Attend [name] regularly to improve shortness of breath with ADL's. Long: maintain independence with ADL's           ITP Comments:  ITP Comments    Row Name 07/19/20 1010 07/24/20 1636 08/05/20 1145 08/14/20 0931 09/11/20 0623   ITP Comments Initial telephone orientations completed. Diagnosis can be found in [facility] 3/21. EP orientation scheduled for 3/30 at 1:30. Completed and orientation.  Initial ITP sent to Dr  Hyacinth Meeker First full day of exercise!  Patient was oriented to gym and equipment including functions, settings, policies, and procedures.  Patient's individual exercise prescription and treatment plan were reviewed.  All starting workloads were established based on the results of the 6 minute walk test done at initial orientation visit.  The plan for exercise progression was also introduced and progression will be customized based on patient's performance and goals. 30 Day review completed. Medical Director ITP review done, changes made as directed, and signed approval by Medical Director.   New to program 30 Day review completed. Medical Director ITP review done, changes made as directed, and signed approval by Medical Director.           Comments:

## 2020-09-13 ENCOUNTER — Encounter: Payer: Medicare Other | Admitting: *Deleted

## 2020-09-13 ENCOUNTER — Other Ambulatory Visit: Payer: Self-pay

## 2020-09-13 DIAGNOSIS — I5032 Chronic diastolic (congestive) heart failure: Secondary | ICD-10-CM | POA: Diagnosis not present

## 2020-09-13 NOTE — Progress Notes (Signed)
Daily Session Note  Patient Details  Name: Kimberly Mccarthy MRN: [id number] Date of Birth: Oct 05, 1955 Referring Provider:   April Mccarthy Pulmonary Rehab from 07/24/2020 in [facility] Cardiac and Pulmonary Rehab  Referring Provider Kimberly Mccarthy      Encounter Date: 09/13/2020  Check In:  Session Check In - 09/13/20 1007      Check-In   Supervising physician immediately available to respond to emergencies See telemetry face sheet for immediately available ER MD    Location [facility]-Cardiac & Pulmonary Rehab    Staff Present Kimberly Papa, RN BSN;Kimberly Mccarthy [address] [place], [place], Kimberly Mccarthy, CCRP, CCET    Virtual Visit No    Medication changes reported     No    Fall or balance concerns reported    No    Warm-up and Cool-down Performed on first and last piece of equipment    Resistance Training Performed Yes    VAD Patient? No    PAD/SET Patient? No      Pain Assessment   Currently in Pain? No/denies              Social History   Tobacco Use  Smoking Status Never Smoker  Smokeless Tobacco Never Used    Goals Met:  Independence with exercise equipment Exercise tolerated well No report of cardiac concerns or symptoms Strength training completed today  Goals Unmet:  Not Applicable  Comments: Pt able to follow exercise prescription today without complaint.  Will continue to monitor for progression.    Dr. Emily Mccarthy is Medical Director for [facility].

## 2020-09-16 ENCOUNTER — Encounter: Payer: Medicare Other | Admitting: *Deleted

## 2020-09-16 ENCOUNTER — Other Ambulatory Visit: Payer: Self-pay

## 2020-09-16 DIAGNOSIS — I5032 Chronic diastolic (congestive) heart failure: Secondary | ICD-10-CM

## 2020-09-16 NOTE — Progress Notes (Signed)
Daily Session Note  Patient Details  Name: Kimberly Mccarthy MRN: [id number] Date of Birth: 30-Jun-1955 Referring Provider:   Flowsheet Row Pulmonary Rehab from 07/24/2020 in [facility] Cardiac and Pulmonary Rehab  Referring Provider Gollan      Encounter Date: 09/16/2020  Check In:  Session Check In - 09/16/20 1034      Check-In   Supervising physician immediately available to respond to emergencies See telemetry face sheet for immediately available ER MD    Location [facility]-Cardiac & Pulmonary Rehab    Staff Present Heath Lark, RN, BSN, CCRP;Joseph Hood RCP,RRT,BSRT;Kelly [place], [place], ACSM CEP, Exercise Physiologist    Virtual Visit No    Medication changes reported     No    Fall or balance concerns reported    No    Warm-up and Cool-down Performed on first and last piece of equipment    Resistance Training Performed Yes    VAD Patient? No    PAD/SET Patient? No      Pain Assessment   Currently in Pain? No/denies              Social History   Tobacco Use  Smoking Status Never Smoker  Smokeless Tobacco Never Used    Goals Met:  Proper associated with RPD/PD & O2 Sat Independence with exercise equipment Exercise tolerated well No report of cardiac concerns or symptoms  Goals Unmet:  Not Applicable  Comments: Pt able to follow exercise prescription today without complaint.  Will continue to monitor for progression.    Dr. Emily Filbert is Medical Director for [place].  Dr. Ottie Glazier is Medical Director for [facility] Pulmonary Rehabilitation.

## 2020-09-18 ENCOUNTER — Other Ambulatory Visit: Payer: Self-pay

## 2020-09-18 DIAGNOSIS — I5032 Chronic diastolic (congestive) heart failure: Secondary | ICD-10-CM

## 2020-09-18 NOTE — Progress Notes (Signed)
Daily Session Note  Patient Details  Name: Kimberly Mccarthy MRN: [id number] Date of Birth: [birth year] Referring Provider:   Flowsheet Row Pulmonary Rehab from 07/24/2020 in [facility] Cardiac and Pulmonary Rehab  Referring Provider Gollan      Encounter Date: 09/18/2020  Check In:  Session Check In - 09/18/20 1111      Check-In   Supervising physician immediately available to respond to emergencies See telemetry face sheet for immediately available ER MD    Location [facility]-Cardiac & Pulmonary Rehab    Staff Present Birdie Sons, MPA, RN;Melissa Caiola RDN, Rowe Pavy, BA, ACSM CEP, Exercise Physiologist    Virtual Visit No    Medication changes reported     No    Fall or balance concerns reported    No    Tobacco Cessation No Change    Warm-up and Cool-down Performed on first and last piece of equipment    Resistance Training Performed Yes    VAD Patient? No    PAD/SET Patient? No      Pain Assessment   Currently in Pain? No/denies              Social History   Tobacco Use  Smoking Status Never Smoker  Smokeless Tobacco Never Used    Goals Met:  Independence with exercise equipment Exercise tolerated well No report of cardiac concerns or symptoms Strength training completed today  Goals Unmet:  Not Applicable  Comments: Pt able to follow exercise prescription today without complaint.  Will continue to monitor for progression.    Dr. Emily Filbert is Medical Director for [place].  Dr. Ottie Glazier is Medical Director for [facility] Pulmonary Rehabilitation.

## 2020-09-20 ENCOUNTER — Encounter: Payer: Medicare Other | Admitting: *Deleted

## 2020-09-20 ENCOUNTER — Other Ambulatory Visit: Payer: Self-pay

## 2020-09-20 ENCOUNTER — Telehealth: Payer: Self-pay | Admitting: Cardiovascular Disease

## 2020-09-20 DIAGNOSIS — I5032 Chronic diastolic (congestive) heart failure: Secondary | ICD-10-CM

## 2020-09-20 NOTE — Progress Notes (Signed)
Daily Session Note  Patient Details  Name: Kimberly Mccarthy MRN: [id number] Date of Birth: 1955-05-09 Referring Provider:   April Manson Pulmonary Rehab from 07/24/2020 in [facility]  Referring Provider Gollan      Encounter Date: 09/20/2020  Check In:  Session Check In - 09/20/20 7915      Check-In   Supervising physician immediately available to respond to emergencies See telemetry face sheet for immediately available ER MD    Location [facility]-Cardiac & Pulmonary Rehab    Staff Present Heath Lark, RN, BSN, CCRP;Jessica Cricket, MA, RCEP, CCRP, Marylynn Pearson, MS, ASCM CEP, Exercise Physiologist;Meredith Sherryll Burger, RN BSN    Virtual Visit No    Medication changes reported     No    Fall or balance concerns reported    No    Warm-up and Cool-down Performed on first and last piece of equipment    Resistance Training Performed Yes    VAD Patient? No    PAD/SET Patient? No      Pain Assessment   Currently in Pain? No/denies              Social History   Tobacco Use  Smoking Status Never Smoker  Smokeless Tobacco Never Used    Goals Met:  Proper associated with RPD/PD & O2 Sat Independence with exercise equipment Exercise tolerated well No report of cardiac concerns or symptoms  Goals Unmet:  Not Applicable  Comments: Pt able to follow exercise prescription today without complaint.  Will continue to monitor for progression.    Dr. Emily Filbert is Medical Director for [facility].  Dr. Ottie Glazier is Medical Director for [facility] Pulmonary Rehabilitation.

## 2020-09-20 NOTE — Telephone Encounter (Signed)
Patient wanting to get an rx for Miralax so insurance will cover .    Patient pharmacy already tried pcp with no response .

## 2020-09-20 NOTE — Telephone Encounter (Signed)
Line busy

## 2020-09-20 NOTE — Telephone Encounter (Signed)
After multiple attempts no answer with line busy and no voicemail available. This prescription needs to be filled by patients primary care provider. Closing encounter.

## 2020-09-27 ENCOUNTER — Encounter: Payer: Medicare Other | Attending: Cardiovascular Disease | Admitting: *Deleted

## 2020-09-27 ENCOUNTER — Other Ambulatory Visit: Payer: Self-pay

## 2020-09-27 DIAGNOSIS — I5032 Chronic diastolic (congestive) heart failure: Secondary | ICD-10-CM | POA: Insufficient documentation

## 2020-09-27 NOTE — Progress Notes (Signed)
Daily Session Note  Patient Details  Name: Kimberly Mccarthy MRN: [id number] Date of Birth: 01/02/1956 Referring Provider:   Flowsheet Row Pulmonary Rehab from 07/24/2020 in [facility] Pul[facility] Rehab  Referring Provider Gollan      Encounter Date: 09/27/2020  Check In:  Session Check In - 09/27/20 0934      Check-In   Supervising physician immediately available to respond to emergencies See telemetry face sheet for immediately available ER MD    Location [facility]    Staff Present Heath Lark, RN, BSN, CCRP;Joseph Hood RCP,RRT,BSRT;Jessica Rupert, [place], [place], [place], CCET    Virtual Visit No    Medication changes reported     No    Fall or balance concerns reported    No    Warm-up and Cool-down Performed on first and last piece of equipment    Resistance Training Performed Yes    VAD Patient? No    PAD/SET Patient? No      Pain Assessment   Currently in Pain? No/denies              Social History   Tobacco Use  Smoking Status Never Smoker  Smokeless Tobacco Never Used    Goals Met:  Proper associated with RPD/PD & O2 Sat Independence with exercise equipment Exercise tolerated well No report of cardiac concerns or symptoms  Goals Unmet:  Not Applicable  Comments: Pt able to follow exercise prescription today without complaint.  Will continue to monitor for progression.    Dr. Emily Filbert is Medical Director for [facility].  Dr. Ottie Glazier is Medical Director for [facility] Pulmonary Rehabilitation.

## 2020-09-30 ENCOUNTER — Other Ambulatory Visit: Payer: Self-pay

## 2020-09-30 ENCOUNTER — Encounter: Payer: Medicare Other | Admitting: *Deleted

## 2020-09-30 DIAGNOSIS — I5032 Chronic diastolic (congestive) heart failure: Secondary | ICD-10-CM | POA: Diagnosis not present

## 2020-09-30 NOTE — Progress Notes (Signed)
Daily Session Note  Patient Details  Name: Kimberly Mccarthy MRN: [id number] Date of Birth: [birth year] Referring Provider:   Flowsheet Row Pulmonary Rehab from 07/24/2020 in [facility] Cardiac and Pulmonary Rehab  Referring Provider Gollan      Encounter Date: 09/30/2020  Check In:  Session Check In - 09/30/20 1206      Check-In   Supervising physician immediately available to respond to emergencies See telemetry face sheet for immediately available ER MD    Location [facility]    Staff Present Justin Mend RCP,RRT,BSRT;Kelly Amedeo Plenty, BS, ACSM CEP, Exercise Physiologist    Virtual Visit No    Medication changes reported     No    Fall or balance concerns reported    No    Warm-up and Cool-down Performed on first and last piece of equipment    Resistance Training Performed Yes    VAD Patient? No    PAD/SET Patient? No      Pain Assessment   Currently in Pain? No/denies              Social History   Tobacco Use  Smoking Status Never Smoker  Smokeless Tobacco Never Used    Goals Met:  Proper associated with RPD/PD & O2 Sat Independence with exercise equipment Exercise tolerated well No report of cardiac concerns or symptoms  Goals Unmet:  Not Applicable  Comments: Pt able to follow exercise prescription today without complaint.  Will continue to monitor for progression.    Dr. Emily Filbert is Medical Director for [place].  Dr. Ottie Glazier is Medical Director for [facility] Pulmonary Rehabilitation.

## 2020-10-02 ENCOUNTER — Other Ambulatory Visit: Payer: Self-pay

## 2020-10-02 DIAGNOSIS — I5032 Chronic diastolic (congestive) heart failure: Secondary | ICD-10-CM

## 2020-10-02 NOTE — Progress Notes (Signed)
Daily Session Note  Patient Details  Name: JERMEKA SCHLOTTERBECK MRN: [id number] Date of Birth: 1955/08/01 Referring Provider:   Flowsheet Row Pulmonary Rehab from 07/24/2020 in [facility] Cardiac and Pulmonary Rehab  Referring Provider Gollan      Encounter Date: 10/02/2020  Check In:  Session Check In - 10/02/20 0926      Check-In   Supervising physician immediately available to respond to emergencies See telemetry face sheet for immediately available ER MD    Location [facility] Pul[facility] Re[facility]    Staff Present Birdie Sons, MPA, Elveria Rising, BA, ACSM CEP, Exercise Physiologist;Joseph Tessie Fass RCP,RRT,BSRT    Virtual Visit No    Medication changes reported     No    Fall or balance concerns reported    No    Tobacco Cessation No Change    Warm-up and Cool-down Performed on first and last piece of equipment    Resistance Training Performed Yes    VAD Patient? No    PAD/SET Patient? No      Pain Assessment   Currently in Pain? No/denies              Social History   Tobacco Use  Smoking Status Never Smoker  Smokeless Tobacco Never Used    Goals Met:  Independence with exercise equipment Exercise tolerated well No report of cardiac concerns or symptoms Strength training completed today  Goals Unmet:  Not Applicable  Comments: Pt able to follow exercise prescription today without complaint.  Will continue to monitor for progression.    Dr. Emily Filbert is Medical Director for [facility].  Dr. Ottie Glazier is Medical Director for [facility] Pulmonary Rehabilitation.

## 2020-10-04 ENCOUNTER — Other Ambulatory Visit: Payer: Self-pay | Admitting: Family

## 2020-10-04 ENCOUNTER — Other Ambulatory Visit: Payer: Self-pay

## 2020-10-04 DIAGNOSIS — I5032 Chronic diastolic (congestive) heart failure: Secondary | ICD-10-CM | POA: Diagnosis not present

## 2020-10-04 NOTE — Progress Notes (Signed)
Daily Session Note  Patient Details  Name: Kimberly Mccarthy MRN: [id number] Date of Birth: 1955/08/27 Referring Provider:   April Manson Pulmonary Rehab from 07/24/2020 in [facility] Card[facility] and Pulmonary Rehab  Referring Provider Gollan       Encounter Date: 10/04/2020  Check In:  Session Check In - 10/04/20 0935       Check-In   Supervising physician immediately available to respond to emergencies See telemetry face sheet for immediately available ER MD    Location [facility] Pul[facility] Re[facility]    Staff Present Justin Mend RCP,RRT,BSRT;Vida Rigger RN, BSN;Jessica Luan Pulling, MA, RCEP, CCRP, CCET    Virtual Visit No    Medication changes reported     No    Fall or balance concerns reported    No    Warm-up and Cool-down Performed on first and last piece of equipment    Resistance Training Performed Yes    VAD Patient? No    PAD/SET Patient? No      Pain Assessment   Currently in Pain? No/denies                Social History   Tobacco Use  Smoking Status Never  Smokeless Tobacco Never    Goals Met:  Proper associated with RPD/PD & O2 Sat Independence with exercise equipment Exercise tolerated well No report of cardiac concerns or symptoms Strength training completed today  Goals Unmet:  Not Applicable  Comments: Pt able to follow exercise prescription today without complaint.  Will continue to monitor for progression.   Dr. Emily Filbert is Medical Director for [facility].  Dr. Ottie Glazier is Medical Director for [facility] Pulmonary Rehabilitation.

## 2020-10-07 ENCOUNTER — Other Ambulatory Visit: Payer: Self-pay

## 2020-10-07 ENCOUNTER — Encounter: Payer: Medicare Other | Admitting: *Deleted

## 2020-10-07 DIAGNOSIS — I5032 Chronic diastolic (congestive) heart failure: Secondary | ICD-10-CM

## 2020-10-07 NOTE — Progress Notes (Signed)
Daily Session Note  Patient Details  Name: Kimberly Mccarthy MRN: [id number] Date of Birth: 05/31/1955 Referring Provider:   Flowsheet Row Pulmonary Rehab from 07/24/2020 in [facility] Cardiac and Pulmonary Rehab  Referring Provider Gollan       Encounter Date: 10/07/2020  Check In:  Session Check In - 10/07/20 1347       Check-In   Supervising physician immediately available to respond to emergencies See telemetry face sheet for immediately available ER MD    Location [facility] Pul[facility] Re[facility]    Staff Present Justin Mend Jaci Carrel, BS, ACSM CEP, Exercise Physiologist;Elby Blackwelder, RN, BSN, CCRP    Virtual Visit No    Medication changes reported     No    Fall or balance concerns reported    No    Warm-up and Cool-down Performed on first and last piece of equipment    Resistance Training Performed Yes    VAD Patient? No    PAD/SET Patient? No      Pain Assessment   Currently in Pain? No/denies                Social History   Tobacco Use  Smoking Status Never  Smokeless Tobacco Never    Goals Met:  Proper associated with RPD/PD & O2 Sat Independence with exercise equipment Exercise tolerated well No report of cardiac concerns or symptoms  Goals Unmet:  Not Applicable  Comments: Pt able to follow exercise prescription today without complaint.  Will continue to monitor for progression.    Dr. Emily Filbert is Medical Director for [facility].  Dr. Ottie Glazier is Medical Director for [facility] Pulmonary Rehabilitation.

## 2020-10-09 ENCOUNTER — Other Ambulatory Visit: Payer: Self-pay

## 2020-10-09 ENCOUNTER — Encounter: Payer: Self-pay | Admitting: *Deleted

## 2020-10-09 DIAGNOSIS — I5032 Chronic diastolic (congestive) heart failure: Secondary | ICD-10-CM

## 2020-10-09 NOTE — Progress Notes (Signed)
Pulmonary Individual Treatment Plan  Patient Details  Name: Kimberly Mccarthy MRN: [id number] Date of Birth: 11/06/1955 Referring Provider:   Flowsheet Row Pulmonary Rehab from 07/24/2020 in [facility] Cardiac and Pulmonary Rehab  Referring Provider Gollan       Initial Encounter Date:  Flowsheet Row Pulmonary Rehab from 07/24/2020 in [facility] Cardiac and Pulmonary Rehab  Date 07/24/20       Visit Diagnosis: Heart failure, diastolic, chronic (HCC)  Patient's Home Medications on Admission:  Current Outpatient Medications:    FEROSUL 325 (65 Fe) MG tablet, TAKE 1 TABLET BY MOUTH ONE TIME A DAY EVERY MONDAY, WEDNESDAY, AND FRIDAY FOR IRON SUPPLEMENT, Disp: 12 tablet, Rfl: 6   amLODipine (NORVASC) 10 MG tablet, Take 1 tablet (10 mg total) by mouth daily., Disp: 30 tablet, Rfl: 5   ASPIRIN LOW DOSE 81 MG EC tablet, TAKE 1 TABLET BY MOUTH ONE TIME A DAY FOR CVA, Disp: 30 tablet, Rfl: 5   atorvastatin (LIPITOR) 80 MG tablet, TAKE 1 TABLET BY MOUTH DAILY, Disp: 30 tablet, Rfl: 5   carvedilol (COREG) 25 MG tablet, Take 1 tablet (25 mg total) by mouth 2 (two) times daily with a meal., Disp: 60 tablet, Rfl: 5   clopidogrel (PLAVIX) 75 MG tablet, Take 1 tablet (75 mg total) by mouth daily., Disp: 30 tablet, Rfl: 5   ezetimibe (ZETIA) 10 MG tablet, TAKE 1 TABLET BY MOUTH DAILY, Disp: 30 tablet, Rfl: 5   famotidine (PEPCID) 20 MG tablet, TAKE 1 TABLET BY MOUTH TWICE A DAY RELATED TO GASTROESOPHAGEAL REFLUX DISEASE, Disp: 60 tablet, Rfl: 6   gabapentin (NEURONTIN) 300 MG capsule, TAKE 1 CAPSULE BY MOUTH 3 TIMES A DAY FOR PAIN. DO NOT CRUSH, Disp: 90 capsule, Rfl: 3   hydrALAZINE (APRESOLINE) 100 MG tablet, TAKE 1 TABLET BY MOUTH IN THE MORNING, AT NOON AND AT BEDTIME, Disp: 90 tablet, Rfl: 5   JARDIANCE 10 MG TABS tablet, TAKE 1 TABLET BY MOUTH DAILY, Disp: 30 tablet, Rfl: 2   levothyroxine (SYNTHROID) 50 MCG tablet, Take 50 mcg by mouth daily before breakfast., Disp: , Rfl:    losartan (COZAAR) 50 MG  tablet, TAKE 1 TABLET BY MOUTH DAILY, Disp: 90 tablet, Rfl: 1   Multiple Vitamin (MULTIVITAMIN ADULT PO), Take by mouth daily., Disp: , Rfl:    oxyCODONE (OXYCONTIN) 10 mg 12 hr tablet, Take 10 mg by mouth every 12 (twelve) hours. , Disp: , Rfl:    pantoprazole (PROTONIX) 40 MG tablet, Take 40 mg by mouth 2 (two) times daily., Disp: , Rfl:    polyethylene glycol (MIRALAX / GLYCOLAX) 17 g packet, Take 17 g by mouth daily., Disp: 14 each, Rfl: 0   senna (SENOKOT) 8.6 MG tablet, Take 1 tablet by mouth daily., Disp: , Rfl:    Vitamin D, Ergocalciferol, (DRISDOL) 1.25 MG (50000 UNIT) CAPS capsule, Take 50,000 Units by mouth once a week., Disp: , Rfl:   Past Medical History: Past Medical History:  Diagnosis Date   Anemia    AVM (arteriovenous malformation)    Bronchitis    Carotid artery disease (HCC) 10/31/2015   Chronic respiratory failure (HCC)    COPD (chronic obstructive pulmonary disease) (HCC)    Critical ischemia of lower extremity (HCC) 07/18/2014   H/O blood clots    Hyperkalemia    Hypertension    Hypothyroidism 09/05/2014   Morbid obesity (HCC)    PVD (peripheral vascular disease) (HCC)    Renal failure    Stroke (HCC)    may  2015   Thyroid disease     Tobacco Use: Social History   Tobacco Use  Smoking Status Never  Smokeless Tobacco Never    Labs: Recent Review Flowsheet Data     Labs for ITP Cardiac and Pulmonary Rehab Latest Ref Rng & Units 02/14/2020 02/15/2020 02/15/2020 02/16/2020 07/15/2020   Cholestrol 100 - 199 mg/dL - - 161(W) - 960   LDLCALC 0 - 99 mg/dL - - 454(U) - 90   HDL >98 mg/dL - - 41 - 47   Trlycerides 0 - 149 mg/dL 119 147(W) 295(A) 213 77   Hemoglobin A1c 4.8 - 5.6 % - - - - -   PHART 7.350 - 7.450 - - - - -   PCO2ART 32.0 - 48.0 mmHg - - - - -   HCO3 20.0 - 28.0 mmol/L - - - - -   ACIDBASEDEF 0.0 - 2.0 mmol/L - - - - -   O2SAT % - - - - -        Pulmonary Assessment Scores:  Pulmonary Assessment Scores     Row Name 07/24/20 1626          ADL UCSD   SOB Score total 14     Rest 0     Walk 1     Stairs 2     Bath 0     Dress 0     Shop 0           CAT Score     CAT Score 6           mMRC Score     mMRC Score 2             UCSD: Self-administered rating of dyspnea associated with activities of daily living (ADLs) 6-point scale (0 = "not at all" to 5 = "maximal or unable to do because of breathlessness")  Scoring Scores range from 0 to 120.  Minimally important difference is 5 units  CAT: CAT can identify the health impairment of COPD patients and is better correlated with disease progression.  CAT has a scoring range of zero to 40. The CAT score is classified into four groups of low (less than 10), medium (10 - 20), high (21-30) and very high (31-40) based on the impact level of disease on health status. A CAT score over 10 suggests significant symptoms.  A worsening CAT score could be explained by an exacerbation, poor medication adherence, poor inhaler technique, or progression of COPD or comorbid conditions.  CAT MCID is 2 points  mMRC: mMRC (Modified Medical Research Council) Dyspnea Scale is used to assess the degree of baseline functional disability in patients of respiratory disease due to dyspnea. No minimal important difference is established. A decrease in score of 1 point or greater is considered a positive change.   Pulmonary Function Assessment:   Exercise Target Goals: Exercise Program Goal: Individual exercise prescription set using results from initial 6 min walk test and THRR while considering  patient's activity barriers and safety.   Exercise Prescription Goal: Initial exercise prescription builds to 30-45 minutes a day of aerobic activity, 2-3 days per week.  Home exercise guidelines will be given to patient during program as part of exercise prescription that the participant will acknowledge.  Education: Aerobic Exercise: - Group verbal and visual presentation on the components  of exercise prescription. Introduces F.I.T.T principle from ACSM for exercise prescriptions.  Reviews F.I.T.T. principles of aerobic exercise including progression. Written material given at  graduation. Flowsheet Row Pulmonary Rehab from 10/02/2020 in [facility] Cardiac and Pulmonary Rehab  Date 08/14/20  Educator AS  Instruction Review Code 1- Verbalizes Understanding       Education: Resistance Exercise: - Group verbal and visual presentation on the components of exercise prescription. Introduces F.I.T.T principle from ACSM for exercise prescriptions  Reviews F.I.T.T. principles of resistance exercise including progression. Written material given at graduation. Flowsheet Row Pulmonary Rehab from 10/02/2020 in [facility] Cardiac and Pulmonary Rehab  Date 08/21/20  Educator AS  Instruction Review Code 1- Verbalizes Understanding        Education: Exercise & Equipment Safety: - Individual verbal instruction and demonstration of equipment use and safety with use of the equipment. Flowsheet Row Pulmonary Rehab from 10/02/2020 in [facility] Cardiac and Pulmonary Rehab  Date 07/24/20  Educator AS  Instruction Review Code 1- Verbalizes Understanding       Education: Exercise Physiology & General Exercise Guidelines: - Group verbal and written instruction with models to review the exercise physiology of the cardiovascular system and associated critical values. Provides general exercise guidelines with specific guidelines to those with heart or lung disease.    Education: Flexibility, Balance, Mind/Body Relaxation: - Group verbal and visual presentation with interactive activity on the components of exercise prescription. Introduces F.I.T.T principle from ACSM for exercise prescriptions. Reviews F.I.T.T. principles of flexibility and balance exercise training including progression. Also discusses the mind body connection.  Reviews various relaxation techniques to help reduce and manage stress (i.e. Deep breathing,  progressive muscle relaxation, and visualization). Balance handout provided to take home. Written material given at graduation. Flowsheet Row Pulmonary Rehab from 10/02/2020 in [facility] Cardiac and Pulmonary Rehab  Date 08/28/20  Educator AS  Instruction Review Code 1- Verbalizes Understanding       Activity Barriers & Risk Stratification:  Activity Barriers & Cardiac Risk Stratification - 07/19/20 1003       Activity Barriers & Cardiac Risk Stratification   Activity Barriers Arthritis;Joint Problems;Muscular Weakness;Balance Concerns;Assistive Device   cane and walker            6 Minute Walk:  6 Minute Walk     Row Name 07/24/20 1615         6 Minute Walk   Distance 415 feet     Walk Time 4.5 minutes     # of Rest Breaks 3     MPH 1.04     METS 0.97     RPE 15     Perceived Dyspnea  1     VO2 Peak 3.4     Symptoms Yes (comment)     Comments legs hurt 10/10     Resting HR 61 bpm     Resting BP 152/78     Resting Oxygen Saturation  95 %     Exercise Oxygen Saturation  during 6 min walk 88 %     Max Ex. HR 91 bpm     Max Ex. BP 160/68     2 Minute Post BP 144/66           Interval HR     1 Minute HR 87     2 Minute HR 84     3 Minute HR 88     4 Minute HR 91     5 Minute HR 85     6 Minute HR 91     2 Minute Post HR 72     Interval Heart Rate? Yes  Interval Oxygen     Interval Oxygen? Yes     Baseline Oxygen Saturation % 95 %     1 Minute Oxygen Saturation % 90 %     1 Minute Liters of Oxygen 0 L     2 Minute Oxygen Saturation % 90 %     2 Minute Liters of Oxygen 0 L     3 Minute Oxygen Saturation % 91 %     3 Minute Liters of Oxygen 0 L     4 Minute Oxygen Saturation % 91 %     4 Minute Liters of Oxygen 0 L     5 Minute Oxygen Saturation % 89 %     5 Minute Liters of Oxygen 0 L     6 Minute Oxygen Saturation % 88 %     6 Minute Liters of Oxygen 0 L     2 Minute Post Oxygen Saturation % 95 %     2 Minute Post Liters of Oxygen 0 L             Oxygen Initial Assessment:  Oxygen Initial Assessment - 07/19/20 1004       Home Oxygen   Home Oxygen Device None    Sleep Oxygen Prescription None    Home Exercise Oxygen Prescription None    Home Resting Oxygen Prescription None      Initial 6 min Walk   Oxygen Used None      Program Oxygen Prescription   Program Oxygen Prescription None      Intervention   Short Term Goals To learn and understand importance of maintaining oxygen saturations>88%;To learn and understand importance of monitoring SPO2 with pulse oximeter and demonstrate accurate use of the pulse oximeter.;To learn and demonstrate proper pursed lip breathing techniques or other breathing techniques. ;To learn and demonstrate proper use of respiratory medications    Long  Term Goals Verbalizes importance of monitoring SPO2 with pulse oximeter and return demonstration;Maintenance of O2 saturations>88%;Exhibits proper breathing techniques, such as pursed lip breathing or other method taught during program session;Compliance with respiratory medication;Demonstrates proper use of MDI's             Oxygen Re-Evaluation:  Oxygen Re-Evaluation     Row Name 08/05/20 1147 08/30/20 0927           Program Oxygen Prescription   Program Oxygen Prescription None None             Home Oxygen      Home Oxygen Device None None      Sleep Oxygen Prescription None None      Home Exercise Oxygen Prescription None None      Home Resting Oxygen Prescription None None      Compliance with Home Oxygen Use -- Yes             Goals/Expected Outcomes      Short Term Goals To learn and understand importance of maintaining oxygen saturations>88%;To learn and understand importance of monitoring SPO2 with pulse oximeter and demonstrate accurate use of the pulse oximeter.;To learn and demonstrate proper pursed lip breathing techniques or other breathing techniques. ;To learn and demonstrate proper use of respiratory medications  To learn and understand importance of maintaining oxygen saturations>88%      Long  Term Goals Verbalizes importance of monitoring SPO2 with pulse oximeter and return demonstration;Maintenance of O2 saturations>88%;Exhibits proper breathing techniques, such as pursed lip breathing or other method taught during program session;Compliance with respiratory medication;Demonstrates  proper use of MDI's Maintenance of O2 saturations>88%      Comments Reviewed PLB technique with pt.  Talked about how it works and it's importance in maintaining their exercise saturations. She has a pulse oximeter to check her oxygen saturation at home. Informed her  and explained why it is important to have one. Reviewed that oxygen saturations should be 88 percent and above. She states that her oxygen at home is in the mid 90s. Kimberly Mccarthy works on PLB at home and has no questions on how to do so.      Goals/Expected Outcomes Short: Become more profiecient at using PLB.   Long: Become independent at using PLB. Short: monitor oxygen at home with exertion. Long: maintain oxygen saturations above 88 percent independently.              Oxygen Discharge (Final Oxygen Re-Evaluation):  Oxygen Re-Evaluation - 08/30/20 0927       Program Oxygen Prescription   Program Oxygen Prescription None      Home Oxygen   Home Oxygen Device None    Sleep Oxygen Prescription None    Home Exercise Oxygen Prescription None    Home Resting Oxygen Prescription None    Compliance with Home Oxygen Use Yes      Goals/Expected Outcomes   Short Term Goals To learn and understand importance of maintaining oxygen saturations>88%    Long  Term Goals Maintenance of O2 saturations>88%    Comments She has a pulse oximeter to check her oxygen saturation at home. Informed her  and explained why it is important to have one. Reviewed that oxygen saturations should be 88 percent and above. She states that her oxygen at home is in the mid 90s. Kimberly Mccarthy works on  PLB at home and has no questions on how to do so.    Goals/Expected Outcomes Short: monitor oxygen at home with exertion. Long: maintain oxygen saturations above 88 percent independently.             Initial Exercise Prescription:  Initial Exercise Prescription - 07/24/20 1600       Date of Initial Exercise RX and Referring Provider   Date 07/24/20    Referring Provider Gollan      Treadmill   MPH 1    Grade 0    Minutes 15    METs 1      Recumbant Bike   Level 1    RPM 60    Minutes 15    METs 1      NuStep   Level 1    SPM 80    Minutes 15    METs 1      Arm Ergometer   Level 1    RPM 25    Minutes 15    METs 1      Recumbant Elliptical   Level 1    RPM 50    Minutes 15    METs 1      Biostep-RELP   Level 1    SPM 50    Minutes 15    METs 1      Prescription Details   Frequency (times per week) 3    Duration Progress to 30 minutes of continuous aerobic without signs/symptoms of physical distress      Intensity   THRR 40-80% of Max Heartrate 99-137    Ratings of Perceived Exertion 11-13    Perceived Dyspnea 0-4      Resistance Training   Training  Prescription Yes    Weight 3 lb    Reps 10-15             Perform Capillary Blood Glucose checks as needed.  Exercise Prescription Changes:   Exercise Prescription Changes     Row Name 07/24/20 1600 08/14/20 1700 08/28/20 0800 09/04/20 0900 09/11/20 1500     Response to Exercise   Blood Pressure (Admit) 152/78 142/80 146/74 -- 124/58   Blood Pressure (Exercise) 160/68 124/82 144/66 -- 118/78   Blood Pressure (Exit) 144/66 130/82 110/60 -- 126/72   Heart Rate (Admit) 61 bpm 79 bpm 72 bpm -- 66 bpm   Heart Rate (Exercise) 91 bpm 90 bpm 76 bpm -- 78 bpm   Heart Rate (Exit) 72 bpm 69 bpm 64 bpm -- 63 bpm   Oxygen Saturation (Admit) 95 % 93 % 98 % -- 94 %   Oxygen Saturation (Exercise) 88 % 88 % 90 % -- 92 %   Oxygen Saturation (Exit) 95 % 92 % 98 % -- 93 %   Rating of Perceived Exertion  (Exercise) 15 20 13  -- 13   Perceived Dyspnea (Exercise) 1 -- 3 -- 0   Symptoms hips hurt second day -- -- none   Duration -- Progress to 30 minutes of  aerobic without signs/symptoms of physical distress Progress to 30 minutes of  aerobic without signs/symptoms of physical distress -- Continue with 30 min of aerobic exercise without signs/symptoms of physical distress.   Intensity -- THRR unchanged THRR unchanged -- THRR unchanged     Progression   Progression -- Continue to progress workloads to maintain intensity without signs/symptoms of physical distress. Continue to progress workloads to maintain intensity without signs/symptoms of physical distress. -- Continue to progress workloads to maintain intensity without signs/symptoms of physical distress.   Average METs -- 1.7 1.85 -- 1.85     Resistance Training   Training Prescription -- Yes Yes -- Yes   Weight -- 3 lb 3 lb -- 3 lb   Reps -- 10-15 10-15 -- 10-15     Interval Training   Interval Training -- -- -- -- No     Treadmill   MPH -- 1 -- -- --   Grade -- 0 -- -- --   Minutes -- 15 -- -- --   METs -- 1.77 -- -- --     NuStep   Level -- -- 1 -- 3   Minutes -- -- 15 -- 30   METs -- -- 1.7 -- 1.9     T5 Nustep   Level -- 1 -- -- --   Minutes -- 15 -- -- --     Biostep-RELP   Level -- -- 1 -- --   Minutes -- -- 15 -- --   METs -- -- 2 -- --     Home Exercise Plan   Plans to continue exercise at -- -- -- Home (comment)  walking Home (comment)  walking   Frequency -- -- -- Add 2 additional days to program exercise sessions. Add 2 additional days to program exercise sessions.   Initial Home Exercises Provided -- -- -- 09/04/20 09/04/20    Row Name 09/26/20 1100             Response to Exercise   Blood Pressure (Admit) 132/60       Blood Pressure (Exit) 122/82       Heart Rate (Admit) 73 bpm       Heart Rate (Exercise)  76 bpm       Heart Rate (Exit) 70 bpm       Oxygen Saturation (Admit) 91 %       Oxygen  Saturation (Exercise) 85 %       Oxygen Saturation (Exit) 89 %       Rating of Perceived Exertion (Exercise) 11       Perceived Dyspnea (Exercise) 1       Symptoms none       Duration Continue with 30 min of aerobic exercise without signs/symptoms of physical distress.       Intensity THRR unchanged               Progression     Progression Continue to progress workloads to maintain intensity without signs/symptoms of physical distress.       Average METs 1.4               Resistance Training     Training Prescription Yes       Weight 3 lb       Reps 10-15               Interval Training     Interval Training No               NuStep     Level 1       Minutes 30       METs 1.4               Home Exercise Plan     Plans to continue exercise at Home (comment)  walking       Frequency Add 2 additional days to program exercise sessions.       Initial Home Exercises Provided 09/04/20               Exercise Comments:   Exercise Goals and Review:   Exercise Goals     Row Name 07/24/20 1625             Exercise Goals   Increase Physical Activity Yes       Intervention Provide advice, education, support and counseling about physical activity/exercise needs.;Develop an individualized exercise prescription for aerobic and resistive training based on initial evaluation findings, risk stratification, comorbidities and participant's personal goals.       Expected Outcomes Short Term: Attend rehab on a regular basis to increase amount of physical activity.;Long Term: Add in home exercise to make exercise part of routine and to increase amount of physical activity.;Long Term: Exercising regularly at least 3-5 days a week.       Increase Strength and Stamina Yes       Intervention Provide advice, education, support and counseling about physical activity/exercise needs.;Develop an individualized exercise prescription for aerobic and resistive training based on initial  evaluation findings, risk stratification, comorbidities and participant's personal goals.       Expected Outcomes Short Term: Increase workloads from initial exercise prescription for resistance, speed, and METs.;Short Term: Perform resistance training exercises routinely during rehab and add in resistance training at home;Long Term: Improve cardiorespiratory fitness, muscular endurance and strength as measured by increased METs and functional capacity (6MWT)       Able to understand and use rate of perceived exertion (RPE) scale Yes       Intervention Provide education and explanation on how to use RPE scale       Expected Outcomes Short Term: Able to use RPE daily in rehab to express  subjective intensity level;Long Term:  Able to use RPE to guide intensity level when exercising independently       Able to understand and use Dyspnea scale Yes       Intervention Provide education and explanation on how to use Dyspnea scale       Expected Outcomes Short Term: Able to use Dyspnea scale daily in rehab to express subjective sense of shortness of breath during exertion;Long Term: Able to use Dyspnea scale to guide intensity level when exercising independently       Knowledge and understanding of Target Heart Rate Range (THRR) Yes       Intervention Provide education and explanation of THRR including how the numbers were predicted and where they are located for reference       Expected Outcomes Short Term: Able to state/look up THRR;Short Term: Able to use daily as guideline for intensity in rehab;Long Term: Able to use THRR to govern intensity when exercising independently       Able to check pulse independently Yes       Intervention Provide education and demonstration on how to check pulse in carotid and radial arteries.;Review the importance of being able to check your own pulse for safety during independent exercise       Expected Outcomes Short Term: Able to explain why pulse checking is important  during independent exercise;Long Term: Able to check pulse independently and accurately       Understanding of Exercise Prescription Yes       Intervention Provide education, explanation, and written materials on patient's individual exercise prescription       Expected Outcomes Short Term: Able to explain program exercise prescription;Long Term: Able to explain home exercise prescription to exercise independently                Exercise Goals Re-Evaluation :  Exercise Goals Re-Evaluation     Row Name 08/05/20 1146 08/14/20 1742 08/28/20 0834 09/04/20 0930 09/11/20 1505     Exercise Goal Re-Evaluation   Exercise Goals Review Increase Physical Activity;Able to understand and use rate of perceived exertion (RPE) scale;Understanding of Exercise Prescription;Increase Strength and Stamina;Able to check pulse independently Increase Physical Activity;Increase Strength and Stamina Increase Physical Activity;Increase Strength and Stamina Increase Physical Activity;Increase Strength and Stamina;Able to understand and use rate of perceived exertion (RPE) scale;Able to understand and use Dyspnea scale;Knowledge and understanding of Target Heart Rate Range (THRR);Able to check pulse independently;Understanding of Exercise Prescription Increase Physical Activity;Increase Strength and Stamina;Understanding of Exercise Prescription   Comments Reviewed RPE and dyspnea scales, THR and program prescription with pt today.  Pt voiced understanding and was given a copy of goals to take home. Kimberly Mccarthy rated the TM a 20 for RPE.  She is just starting to exercise so staff wil encourage her to rest if needed and build up to 15 min. Kimberly Mccarthy tried walking the track.  She still reported RPE of 17.  Staff will monitor progress. Reviewed home exercise with pt today.  Pt plans to walk at home for exercise.  Reviewed THR, pulse, RPE, sign and symptoms, pulse oximetery and when to call 911 or MD.  Also discussed weather considerations  and indoor options.  Pt voiced understanding. Kimberly Mccarthy has been doing well in rehab.  She is up to 30 min on the NuStep.  She does not like the BioStep and not comfortable walking.  We will continue to monitor his progress.   Expected Outcomes Short: Use RPE daily to regulate intensity.  Long: Follow program prescription in THR. Short: work towards 15 min on TM Long : build overall stamina Short:  continue to work on walking  Long:  be able to walk 15 minutes Short:  continue to work on walking  Long:  be able to walk 15 minutes Short: Continue move up workload Long: Continue to walk at home    Row Name 09/20/20 0926 09/26/20 1128           Exercise Goal Re-Evaluation   Exercise Goals Review Increase Physical Activity;Increase Strength and Stamina;Understanding of Exercise Prescription;Knowledge and understanding of Target Heart Rate Range (THRR) Increase Physical Activity;Increase Strength and Stamina      Comments Kimberly Mccarthy is doing well. She tries to walk at the grocery store instead of riding in a chair which she is proud of. She tries to exercise at home on the off days from rehab. Encouraged to keep checking her HR and O2 during exercise. She is aware of  her THR and is encouraged to try to hit her goal. Kimberly Mccarthy has attended consistently this month.  Staff work with Kimberly Mccarthy on PLB and making sure oxygen stays above 88%.  We will continue to monitor progress.      Expected Outcomes Short: Continue to watch HR and O2 at home Long: Exercise at home with appropriate prescription Short: maintain consistent attendance Long: improve stamina               Discharge Exercise Prescription (Final Exercise Prescription Changes):  Exercise Prescription Changes - 09/26/20 1100       Response to Exercise   Blood Pressure (Admit) 132/60    Blood Pressure (Exit) 122/82    Heart Rate (Admit) 73 bpm    Heart Rate (Exercise) 76 bpm    Heart Rate (Exit) 70 bpm    Oxygen Saturation (Admit) 91 %    Oxygen  Saturation (Exercise) 85 %    Oxygen Saturation (Exit) 89 %    Rating of Perceived Exertion (Exercise) 11    Perceived Dyspnea (Exercise) 1    Symptoms none    Duration Continue with 30 min of aerobic exercise without signs/symptoms of physical distress.    Intensity THRR unchanged      Progression   Progression Continue to progress workloads to maintain intensity without signs/symptoms of physical distress.    Average METs 1.4      Resistance Training   Training Prescription Yes    Weight 3 lb    Reps 10-15      Interval Training   Interval Training No      NuStep   Level 1    Minutes 30    METs 1.4      Home Exercise Plan   Plans to continue exercise at Home (comment)   walking   Frequency Add 2 additional days to program exercise sessions.    Initial Home Exercises Provided 09/04/20             Nutrition:  Target Goals: Understanding of nutrition guidelines, daily intake of sodium 1500mg , cholesterol 200mg , calories 30% from fat and 7% or less from saturated fats, daily to have 5 or more servings of fruits and vegetables.  Education: All About Nutrition: -Group instruction provided by verbal, written material, interactive activities, discussions, models, and posters to present general guidelines for heart healthy nutrition including fat, fiber, MyPlate, the role of sodium in heart healthy nutrition, utilization of the nutrition label, and utilization of this knowledge for meal planning. Follow up  email sent as well. Written material given at graduation. Flowsheet Row Pulmonary Rehab from 10/02/2020 in [facility] Cardiac and Pulmonary Rehab  Date 09/04/20  Educator [facility]  Instruction Review Code 1- Verbalizes Understanding       Biometrics:  Pre Biometrics - 07/24/20 1625       Pre Biometrics   Height  (1.676 m)    Weight 258 lb (117 kg)    BMI (Calculated) 41.66              Nutrition Therapy Plan and Nutrition Goals:  Nutrition Therapy & Goals -  09/02/20 1019       Nutrition Therapy   Diet Heart healthy, low Na    Drug/Food Interactions Statins/Certain Fruits    Protein (specify units) 94g    Fiber 25 grams    Whole Grain Foods 3 servings    Saturated Fats 12 max. grams    Fruits and Vegetables 8 servings/day    Sodium 1.5 grams      Personal Nutrition Goals   Nutrition Goal ST: swap out some sweets with recipes given (like banana "nice" cream) LT: limit sweets to 2-3x/week    Comments She is already reducing her sodium choosing no salt added items. She uses olive oil. She likes vegetables and fruit. She has no salt added canned foods and she cooks beans from dried. She feels that she has peanut butter and chocolate cookies and icecream - she has got back on track now. She feels her biggest problem is sweets. She feels like she knows what she needs to do. Discussed heart healthy eating.      Intervention Plan   Intervention Prescribe, educate and counsel regarding individualized specific dietary modifications aiming towards targeted core components such as weight, hypertension, lipid management, diabetes, heart failure and other comorbidities.;Nutrition handout(s) given to patient.    Expected Outcomes Short Term Goal: Understand basic principles of dietary content, such as calories, fat, sodium, cholesterol and nutrients.;Short Term Goal: A plan has been developed with personal nutrition goals set during dietitian appointment.;Long Term Goal: Adherence to prescribed nutrition plan.             Nutrition Assessments:  MEDIFICTS Score Key: ?70 Need to make dietary changes  40-70 Heart Healthy Diet ? 40 Therapeutic Level Cholesterol Diet  Flowsheet Row Pulmonary Rehab from 07/24/2020 in [facility] Cardiac and Pulmonary Rehab  Picture Your Plate Total Score on Admission 58      Picture Your Plate Scores: <16 Unhealthy dietary pattern with much room for improvement. 41-50 Dietary pattern unlikely to meet recommendations for  good health and room for improvement. 51-60 More healthful dietary pattern, with some room for improvement.  >60 Healthy dietary pattern, although there may be some specific behaviors that could be improved.   Nutrition Goals Re-Evaluation:  Nutrition Goals Re-Evaluation     Row Name 08/30/20 0931 09/20/20 0933           Goals   Current Weight 256 lb (116.1 kg) --      Nutrition Goal Meet with the dietician ST: swap out some sweets with recipes given (like banana "nice" cream) LT: limit sweets to 2-3x/week      Comment Kimberly Mccarthy loves potato chips and wants to work on intaking less. She is meeting with the dietician on Monday. Kimberly Mccarthy is doing well. She has cut out alcohol from her diet and her "sweets" is her to-go.  She is opting to use a more "light" icecream and was encouraged to  check the nutrition label for sugar intake. She is trying      Expected Outcome Short: meet with the dietician. Long: maintain a diet that pertains to her needs. Short: Continue to reach goals established by RD Long: Continue to eat heart healthy diet               Nutrition Goals Discharge (Final Nutrition Goals Re-Evaluation):  Nutrition Goals Re-Evaluation - 09/20/20 0933       Goals   Nutrition Goal ST: swap out some sweets with recipes given (like banana "nice" cream) LT: limit sweets to 2-3x/week    Comment Kimberly Mccarthy is doing well. She has cut out alcohol from her diet and her "sweets" is her to-go.  She is opting to use a more "light" icecream and was encouraged to check the nutrition label for sugar intake. She is trying    Expected Outcome Short: Continue to reach goals established by RD Long: Continue to eat heart healthy diet             Psychosocial: Target Goals: Acknowledge presence or absence of significant depression and/or stress, maximize coping skills, provide positive support system. Participant is able to verbalize types and ability to use techniques and skills needed for reducing  stress and depression.   Education: Stress, Anxiety, and Depression - Group verbal and visual presentation to define topics covered.  Reviews how body is impacted by stress, anxiety, and depression.  Also discusses healthy ways to reduce stress and to treat/manage anxiety and depression.  Written material given at graduation. Flowsheet Row Pulmonary Rehab from 10/02/2020 in [facility] Cardiac and Pulmonary Rehab  Date 10/02/20  Educator [facility]  Instruction Review Code 1- [employer] Understanding       Education: Sleep Hygiene -Provides group verbal and written instruction about how sleep can affect your health.  Define sleep hygiene, discuss sleep cycles and impact of sleep habits. Review good sleep hygiene tips.    Initial Review & Psychosocial Screening:  Initial Psych Review & Screening - 07/19/20 1006       Initial Review   Current issues with Current Stress Concerns    Source of Stress Concerns Unable to participate in former interests or hobbies;Unable to perform yard/household activities;Chronic Illness      Family Dynamics   Good Support System? Yes   sister; family     Barriers   Psychosocial barriers to participate in program There are no identifiable barriers or psychosocial needs.;The patient should benefit from training in stress management and relaxation.      Screening Interventions   Interventions Encouraged to exercise;To provide support and resources with identified psychosocial needs;Provide feedback about the scores to participant    Expected Outcomes Short Term goal: Utilizing psychosocial counselor, staff and physician to assist with identification of specific Stressors or current issues interfering with healing process. Setting desired goal for each stressor or current issue identified.;Long Term Goal: Stressors or current issues are controlled or eliminated.;Short Term goal: Identification and review with participant of any Quality of Life or Depression concerns found by  scoring the questionnaire.;Long Term goal: The participant improves quality of Life and PHQ9 Scores as seen by post scores and/or verbalization of changes             Quality of Life Scores:  Scores of 19 and below usually indicate a poorer quality of life in these areas.  A difference of  2-3 points is a clinically meaningful difference.  A difference of 2-3 points in the  total score of the Quality of Life Index has been associated with significant improvement in overall quality of life, self-image, physical symptoms, and general health in studies assessing change in quality of life.  PHQ-9: Recent Review Flowsheet Data     Depression screen [facility] 2/9 07/24/2020 08/30/2015 08/01/2015 07/02/2015 06/06/2015   Decreased Interest 0 0 0 0 0   Down, Depressed, Hopeless 0 0 0 0 0   PHQ - 2 Score 0 0 0 0 0   Altered sleeping 0 - - - -   Tired, decreased energy 0 - - - -   Change in appetite 0 - - - -   Feeling bad or failure about yourself  0 - - - -   Trouble concentrating 0 - - - -   Moving slowly or fidgety/restless 0 - - - -   Suicidal thoughts 0 - - - -   PHQ-9 Score 0 - - - -      Interpretation of Total Score  Total Score Depression Severity:  1-4 = Minimal depression, 5-9 = Mild depression, 10-14 = Moderate depression, 15-19 = Moderately severe depression, 20-27 = Severe depression   Psychosocial Evaluation and Intervention:  Psychosocial Evaluation - 07/19/20 1011       Psychosocial Evaluation & Interventions   Interventions Encouraged to exercise with the program and follow exercise prescription    Comments Kimberly Mccarthy is coming to Pulmonary Rehab with diastolic heart failure. She states her breathing is her main source of stress and she is tired of feeling short of breath. Her family is her main support system and lives close by to help with things. She did get referred in December but wanted to wait until it got warmer to start pulmonary rehab, in the meantime she had been working  on her diet and has lost some weight which helps her feel better. She didn't report any other stressors and states she just takes it day by day.    Expected Outcomes Short: attend Pulmonary Rehab for education and exercise. Long: develop positive self care habits.    Continue Psychosocial Services  Follow up required by staff             Psychosocial Re-Evaluation:  Psychosocial Re-Evaluation     Row Name 08/30/20 0933 09/20/20 0940           Psychosocial Re-Evaluation   Current issues with Current Depression Current Depression      Comments If [place] stays in the house to long she feels like she gets depressed. She goes to the store and back to the house mostly. Exercise is helping her get out of the house and helps with her stress. Kimberly Mccarthy is doing well. She is managing her depression well. She states if she stays home for a long period of time she feels depressed. Not sure if she is on a medication but does not feel like she needs to chane anything. She is trying to go for walks to help give herself a break and feels it helps her. She has good support from her family. Sleep is good.      Expected Outcomes Short: Continue to exercise regularly to support mental health and notify staff of any changes. Long: maintain mental health and well being through teaching of rehab or prescribed medications independently. Short: Continue attending rehab Long: Utilize exericse for stress management and maintain positive attitude      Interventions Encouraged to attend Pulmonary Rehabilitation for the exercise Encouraged to attend  Pulmonary Rehabilitation for the exercise      Continue Psychosocial Services  Follow up required by staff Follow up required by staff               Psychosocial Discharge (Final Psychosocial Re-Evaluation):  Psychosocial Re-Evaluation - 09/20/20 0940       Psychosocial Re-Evaluation   Current issues with Current Depression    Comments Kimberly Mccarthy is doing well. She is  managing her depression well. She states if she stays home for a long period of time she feels depressed. Not sure if she is on a medication but does not feel like she needs to chane anything. She is trying to go for walks to help give herself a break and feels it helps her. She has good support from her family. Sleep is good.    Expected Outcomes Short: Continue attending rehab Long: Utilize exericse for stress management and maintain positive attitude    Interventions Encouraged to attend Pulmonary Rehabilitation for the exercise    Continue Psychosocial Services  Follow up required by staff             Education: Education Goals: Education classes will be provided on a weekly basis, covering required topics. Participant will state understanding/return demonstration of topics presented.  Learning Barriers/Preferences:  Learning Barriers/Preferences - 07/19/20 1014       Learning Barriers/Preferences   Learning Barriers None    Learning Preferences None             General Pulmonary Education Topics:  Infection Prevention: - Provides verbal and written material to individual with discussion of infection control including proper hand washing and proper equipment cleaning during exercise session. Flowsheet Row Pulmonary Rehab from 10/02/2020 in [facility] Cardiac and Pulmonary Rehab  Date 07/24/20  Educator AS  Instruction Review Code 1- Verbalizes Understanding       Falls Prevention: - Provides verbal and written material to individual with discussion of falls prevention and safety. Flowsheet Row Pulmonary Rehab from 10/02/2020 in [facility] Cardiac and Pulmonary Rehab  Date 07/24/20  Educator AS  Instruction Review Code 1- Verbalizes Understanding       Chronic Lung Disease Review: - Group verbal instruction with posters, models, PowerPoint presentations and videos,  to review new updates, new respiratory medications, new advancements in procedures and treatments. Providing  information on websites and "800" numbers for continued self-education. Includes information about supplement oxygen, available portable oxygen systems, continuous and intermittent flow rates, oxygen safety, concentrators, and Medicare reimbursement for oxygen. Explanation of Pulmonary Drugs, including class, frequency, complications, importance of spacers, rinsing mouth after steroid MDI's, and proper cleaning methods for nebulizers. Review of basic lung anatomy and physiology related to function, structure, and complications of lung disease. Review of risk factors. Discussion about methods for diagnosing sleep apnea and types of masks and machines for OSA. Includes a review of the use of types of environmental controls: home humidity, furnaces, filters, dust mite/pet prevention, HEPA vacuums. Discussion about weather changes, air quality and the benefits of nasal washing. Instruction on Warning signs, infection symptoms, calling MD promptly, preventive modes, and value of vaccinations. Review of effective airway clearance, coughing and/or vibration techniques. Emphasizing that all should Create an Action Plan. Written material given at graduation.   AED/CPR: - Group verbal and written instruction with the use of models to demonstrate the basic use of the AED with the basic ABC's of resuscitation.    Anatomy and Cardiac Procedures: - Group verbal and visual presentation and models provide  information about basic cardiac anatomy and function. Reviews the testing methods done to diagnose heart disease and the outcomes of the test results. Describes the treatment choices: Medical Management, Angioplasty, or Coronary Bypass Surgery for treating various heart conditions including Myocardial Infarction, Angina, Valve Disease, and Cardiac Arrhythmias.  Written material given at graduation. Flowsheet Row Pulmonary Rehab from 10/02/2020 in [facility] Cardiac and Pulmonary Rehab  Date 08/21/20  Educator SB  Instruction  Review Code 1- Verbalizes Understanding       Medication Safety: - Group verbal and visual instruction to review commonly prescribed medications for heart and lung disease. Reviews the medication, class of the drug, and side effects. Includes the steps to properly store meds and maintain the prescription regimen.  Written material given at graduation. Flowsheet Row Pulmonary Rehab from 10/02/2020 in [facility] Cardiac and Pulmonary Rehab  Date 09/11/20  Educator SB  Instruction Review Code 1- Verbalizes Understanding       Other: -Provides group and verbal instruction on various topics (see comments)   Knowledge Questionnaire Score:  Knowledge Questionnaire Score - 07/24/20 1628       Knowledge Questionnaire Score   Pre Score 15/18  ADL, oxygen              Core Components/Risk Factors/Patient Goals at Admission:  Personal Goals and Risk Factors at Admission - 07/24/20 1630       Core Components/Risk Factors/Patient Goals on Admission    Weight Management Yes;Weight Loss    Intervention Weight Management: Develop a combined nutrition and exercise program designed to reach desired caloric intake, while maintaining appropriate intake of nutrient and fiber, sodium and fats, and appropriate energy expenditure required for the weight goal.;Weight Management/Obesity: Establish reasonable short term and long term weight goals.    Admit Weight 258 lb (117 kg)    Goal Weight: Short Term 250 lb (113.4 kg)    Goal Weight: Long Term 240 lb (108.9 kg)    Expected Outcomes Short Term: Continue to assess and modify interventions until short term weight is achieved;Long Term: Adherence to nutrition and physical activity/exercise program aimed toward attainment of established weight goal;Weight Maintenance: Understanding of the daily nutrition guidelines, which includes 25-35% calories from fat, 7% or less cal from saturated fats, less than  cholesterol, less than 1.5gm of sodium, & 5 or more  servings of fruits and vegetables daily;Weight Loss: Understanding of general recommendations for a balanced deficit meal plan, which promotes 1-2 lb weight loss per week and includes a negative energy balance of [phone] kcal/d;Understanding recommendations for meals to include 15-35% energy as protein, 25-35% energy from fat, 35-60% energy from carbohydrates, less than  of dietary cholesterol, 20-35 gm of total fiber daily;Understanding of distribution of calorie intake throughout the day with the consumption of 4-5 meals/snacks    Heart Failure Yes    Intervention Provide a combined exercise and nutrition program that is supplemented with education, support and counseling about heart failure. Directed toward relieving symptoms such as shortness of breath, decreased exercise tolerance, and extremity edema.    Expected Outcomes Improve functional capacity of life;Short term: Attendance in program 2-3 days a week with increased exercise capacity. Reported lower sodium intake. Reported increased fruit and vegetable intake. Reports medication compliance.;Short term: Daily weights obtained and reported for increase. Utilizing diuretic protocols set by physician.;Long term: Adoption of self-care skills and reduction of barriers for early signs and symptoms recognition and intervention leading to self-care maintenance.    Hypertension Yes    Intervention Provide  education on lifestyle modifcations including regular physical activity/exercise, weight management, moderate sodium restriction and increased consumption of fresh fruit, vegetables, and low fat dairy, alcohol moderation, and smoking cessation.;Monitor prescription use compliance.    Expected Outcomes Short Term: Continued assessment and intervention until BP is < 140/31mm HG in hypertensive participants. < 130/30mm HG in hypertensive participants with diabetes, heart failure or chronic kidney disease.;Long Term: Maintenance of blood pressure at goal  levels.             Education:Diabetes - Individual verbal and written instruction to review signs/symptoms of diabetes, desired ranges of glucose level fasting, after meals and with exercise. Acknowledge that pre and post exercise glucose checks will be done for 3 sessions at entry of program.   Know Your Numbers and Heart Failure: - Group verbal and visual instruction to discuss disease risk factors for cardiac and pulmonary disease and treatment options.  Reviews associated critical values for Overweight/Obesity, Hypertension, Cholesterol, and Diabetes.  Discusses basics of heart failure: signs/symptoms and treatments.  Introduces Heart Failure Zone chart for action plan for heart failure.  Written material given at graduation. Flowsheet Row Pulmonary Rehab from 10/02/2020 in [facility] Cardiac and Pulmonary Rehab  Date 09/18/20  Educator [facility]  Instruction Review Code 1- Verbalizes Understanding       Core Components/Risk Factors/Patient Goals Review:   Goals and Risk Factor Review     Row Name 08/30/20 0930 09/20/20 0935           Core Components/Risk Factors/Patient Goals Review   Personal Goals Review Improve shortness of breath with ADL's Improve shortness of breath with ADL's;Weight Management/Obesity;Heart Failure      Review Spoke to patient about their shortness of breath and what they can do to improve. Patient has been informed of breathing techniques when starting the program. Patient is informed to tell staff if they have had any med changes and that certain meds they are taking or not taking can be causing shortness of breath. She is able to make her bed and shower independently. She is making her own food and when she feels tired she will sit until she recovers. Kimberly Mccarthy weighs herself everyday and denies any symptoms for HF. She is aware of what to look out for and to notify her doctor with any big changes. Her breathing at home has been OK and continues to use PLB when she ever  feels SOB. She is trying to walk any where as much as she can.      Expected Outcomes Short: Attend [name] regularly to improve shortness of breath with ADL's. Long: maintain independence with ADL's Short: Continue watching weight at home and notify doctor on any major changes Long: Continue to manage lifestyle risk factors               Core Components/Risk Factors/Patient Goals at Discharge (Final Review):   Goals and Risk Factor Review - 09/20/20 0935       Core Components/Risk Factors/Patient Goals Review   Personal Goals Review Improve shortness of breath with ADL's;Weight Management/Obesity;Heart Failure    Review Kimberly Mccarthy weighs herself everyday and denies any symptoms for HF. She is aware of what to look out for and to notify her doctor with any big changes. Her breathing at home has been OK and continues to use PLB when she ever feels SOB. She is trying to walk any where as much as she can.    Expected Outcomes Short: Continue watching weight at home and notify doctor  on any major changes Long: Continue to manage lifestyle risk factors             ITP Comments:  ITP Comments     Row Name 07/19/20 1010 07/24/20 1636 08/05/20 1145 08/14/20 0931 09/11/20 0623   ITP Comments Initial telephone orientations completed. Diagnosis can be found in [facility] 3/21. EP orientation scheduled for 3/30 at 1:30. Completed and orientation.  Initial ITP sent to Dr Hyacinth Meeker First full day of exercise!  Patient was oriented to gym and equipment including functions, settings, policies, and procedures.  Patient's individual exercise prescription and treatment plan were reviewed.  All starting workloads were established based on the results of the 6 minute walk test done at initial orientation visit.  The plan for exercise progression was also introduced and progression will be customized based on patient's performance and goals. 30 Day review completed. Medical Director ITP review done, changes made as  directed, and signed approval by Medical Director.   New to program 30 Day review completed. Medical Director ITP review done, changes made as directed, and signed approval by Medical Director.    Row Name 10/09/20 0651           ITP Comments 30 Day review completed. Medical Director ITP review done, changes made as directed, and signed approval by Medical Director.                Comments:

## 2020-10-09 NOTE — Progress Notes (Signed)
Daily Session Note  Patient Details  Name: Kimberly Mccarthy MRN: [id number] Date of Birth: 08-12-1955 Referring Provider:   Flowsheet Row Pulmonary Rehab from 07/24/2020 in [facility] Cardiac and Pulmonary Rehab  Referring Provider Gollan       Encounter Date: 10/09/2020  Check In:  Session Check In - 10/09/20 [place]       Check-In   Supervising physician immediately available to respond to emergencies See telemetry face sheet for immediately available ER MD    Location [facility]-Cardiac & Pulmonary Rehab    Staff Present Birdie Sons, MPA, RN;Melissa Caiola RDN, LDN;Joseph Tessie Fass RCP,RRT,BSRT    Virtual Visit No    Medication changes reported     No    Fall or balance concerns reported    No    Tobacco Cessation No Change    Warm-up and Cool-down Performed on first and last piece of equipment    Resistance Training Performed Yes    VAD Patient? No    PAD/SET Patient? No      Pain Assessment   Currently in Pain? No/denies                Social History   Tobacco Use  Smoking Status Never  Smokeless Tobacco Never    Goals Met:  Independence with exercise equipment Exercise tolerated well No report of cardiac concerns or symptoms Strength training completed today  Goals Unmet:  Not Applicable  Comments: Pt able to follow exercise prescription today without complaint.  Will continue to monitor for progression.    Dr. Emily Filbert is Medical Director for [place].  Dr. Ottie Glazier is Medical Director for [facility] Pulmonary Rehabilitation.

## 2020-10-11 ENCOUNTER — Other Ambulatory Visit: Payer: Self-pay

## 2020-10-11 ENCOUNTER — Encounter: Payer: Medicare Other | Admitting: *Deleted

## 2020-10-11 ENCOUNTER — Other Ambulatory Visit: Payer: Self-pay | Admitting: Family

## 2020-10-11 DIAGNOSIS — I5032 Chronic diastolic (congestive) heart failure: Secondary | ICD-10-CM

## 2020-10-11 NOTE — Progress Notes (Signed)
Daily Session Note  Patient Details  Name: Kimberly Mccarthy MRN: [id number] Date of Birth: 20-Aug-1955 Referring Provider:   Flowsheet Row Pulmonary Rehab from 07/24/2020 in [facility] Cardiac and Pulmonary Rehab  Referring Provider Gollan       Encounter Date: 10/11/2020  Check In:  Session Check In - 10/11/20 0923       Check-In   Supervising physician immediately available to respond to emergencies See telemetry face sheet for immediately available ER MD    Location [facility]-Cardiac & Pulmonary Rehab    Staff Present Renita Papa, RN BSN;Joseph [place] RCP,RRT,BSRT;Melissa [place] RDN, LDN    Virtual Visit No    Medication changes reported     No    Fall or balance concerns reported    No    Warm-up and Cool-down Performed on first and last piece of equipment    Resistance Training Performed Yes    VAD Patient? No    PAD/SET Patient? No      Pain Assessment   Currently in Pain? No/denies                Social History   Tobacco Use  Smoking Status Never  Smokeless Tobacco Never    Goals Met:  Independence with exercise equipment Exercise tolerated well No report of cardiac concerns or symptoms Strength training completed today  Goals Unmet:  Not Applicable  Comments: Pt able to follow exercise prescription today without complaint.  Will continue to monitor for progression.    Dr. Emily Filbert is Medical Director for [place].  Dr. Ottie Glazier is Medical Director for [facility] Pulmonary Rehabilitation.

## 2020-10-14 ENCOUNTER — Encounter: Payer: Medicare Other | Admitting: *Deleted

## 2020-10-14 ENCOUNTER — Other Ambulatory Visit: Payer: Self-pay

## 2020-10-14 DIAGNOSIS — I5032 Chronic diastolic (congestive) heart failure: Secondary | ICD-10-CM | POA: Diagnosis not present

## 2020-10-14 NOTE — Progress Notes (Signed)
Daily Session Note  Patient Details  Name: THAYER INABINET MRN: [id number] Date of Birth: February 16, 1956 Referring Provider:   Flowsheet Row Pulmonary Rehab from 07/24/2020 in [facility] Cardiac and Pulmonary Rehab  Referring Provider Gollan       Encounter Date: 10/14/2020  Check In:  Session Check In - 10/14/20 1143       Check-In   Supervising physician immediately available to respond to emergencies See telemetry face sheet for immediately available ER MD    Location [facility]-Cardiac & Pulmonary Rehab    Staff Present Renita Papa, RN BSN;Joseph [address] [place], MPA, Mauricia Area, BS, ACSM CEP, Exercise Physiologist    Virtual Visit No    Medication changes reported     No    Fall or balance concerns reported    No    Warm-up and Cool-down Performed on first and last piece of equipment    Resistance Training Performed Yes    VAD Patient? No      Pain Assessment   Currently in Pain? No/denies                Social History   Tobacco Use  Smoking Status Never  Smokeless Tobacco Never    Goals Met:  Independence with exercise equipment Exercise tolerated well No report of cardiac concerns or symptoms Strength training completed today  Goals Unmet:  Not Applicable  Comments: Pt able to follow exercise prescription today without complaint.  Will continue to monitor for progression.    Dr. Emily Filbert is Medical Director for [place].  Dr. Ottie Glazier is Medical Director for [facility] Pulmonary Rehabilitation.

## 2020-10-15 ENCOUNTER — Ambulatory Visit (INDEPENDENT_AMBULATORY_CARE_PROVIDER_SITE_OTHER): Payer: Medicare Other | Admitting: Cardiovascular Disease

## 2020-10-15 ENCOUNTER — Encounter: Payer: Self-pay | Admitting: Cardiovascular Disease

## 2020-10-15 ENCOUNTER — Other Ambulatory Visit: Payer: Self-pay

## 2020-10-15 VITALS — BP 142/60 | HR 60 | Ht 66.0 in | Wt 257.5 lb

## 2020-10-15 DIAGNOSIS — I10 Essential (primary) hypertension: Secondary | ICD-10-CM | POA: Diagnosis not present

## 2020-10-15 DIAGNOSIS — I25118 Atherosclerotic heart disease of native coronary artery with other forms of angina pectoris: Secondary | ICD-10-CM

## 2020-10-15 DIAGNOSIS — E785 Hyperlipidemia, unspecified: Secondary | ICD-10-CM

## 2020-10-15 DIAGNOSIS — G4733 Obstructive sleep apnea (adult) (pediatric): Secondary | ICD-10-CM

## 2020-10-15 DIAGNOSIS — I739 Peripheral vascular disease, unspecified: Secondary | ICD-10-CM

## 2020-10-15 DIAGNOSIS — I5032 Chronic diastolic (congestive) heart failure: Secondary | ICD-10-CM | POA: Diagnosis not present

## 2020-10-15 MED ORDER — FUROSEMIDE 20 MG PO TABS
20.0000 mg | ORAL_TABLET | Freq: Every day | ORAL | 3 refills | Status: DC | PRN
Start: 2020-10-15 — End: 2021-05-26

## 2020-10-15 NOTE — Progress Notes (Signed)
Cardiology Office Note  Date:  10/15/2020   ID:  Kimberly Mccarthy, DOB 04-02-1956, MRN [id number]  PCP:  [facility]   Chief Complaint  Patient presents with   3 month follow up     Patient c/o shortness of breath, SpO2 ranges from 87% to 92% during cardiac rehab at [facility] & has felt lightheaded most mornings. Medications reviewed by the patient verbally.     HPI:  Kimberly Mccarthy is a 65 y.o. female with a hx of  CAD by cath 01/2020  HTN,  chronic pain,  hypothyroidism,  CKD,  PVD,  CVA,  COPD on QHS Bipap,  diastolic heart failure,  obesity  who presents for follow-up of her coronary artery disease   Weight at home 254.6 dressed Currently not on Lasix Prior discharge summary October 2021 was discharged on IV Lasix, IV labetalol, obviously a mistake in her discharge medications  Reports having some weight gain, abdominal distention, leg swelling High fluid intake at home, drinks at least 3 bottles of water overnight  No chest pain on exertion   lives by herself able to take care of herself.  Her sister is nearby and helps at times with cleaning and grocery shopping.  Participated in rehab   EKG personally reviewed by myself on todays visit Normal sinus rhythm no significant ST-T wave changes rate 60 bpm  Prior records reviewed [facility] 02/12/2020 via EMS with acute on chronic hypoxic respiratory failure due to COPD and diastolic heart failure  intubated on admission.    Echo at that time showed EF 50 to 55%, RV normal size and function, pericardial effusion anterior to the right ventricle, mild to moderate AS without stenosis.   Right and left heart cath showed severe two-vessel CAD with diffuse heavy calcification.  Sequential 90% proximal, 50% mid, 60% distal LAD stenosis as well as CTO at the ostium of codominant RCA.  Left circumflex with moderate disease that was diffuse.  She had severely elevated left heart and right heart pulmonary pressures.     transferred to [facility] for complex PCI versus CABG.   At [facility] her disease burden was determined to be appropriate for medical management and started on intensive medical therapy.    PMH:   has a past medical history of Anemia, AVM (arteriovenous malformation), Bronchitis, Carotid artery disease (HCC) (10/31/2015), Chronic respiratory failure (HCC), COPD (chronic obstructive pulmonary disease) (HCC), Critical ischemia of lower extremity (HCC) (07/18/2014), H/O blood clots, Hyperkalemia, Hypertension, Hypothyroidism (09/05/2014), Morbid obesity (HCC), PVD (peripheral vascular disease) (HCC), Renal failure, Stroke (HCC), and Thyroid disease.  PSH:    Past Surgical History:  Procedure Laterality Date   FEMORAL ENDARTERECTOMY Right    left foot surgery  02/2014   LEG SURGERY Left    RIGHT/LEFT HEART CATH AND CORONARY ANGIOGRAPHY N/A 02/16/2020   Procedure: RIGHT/LEFT HEART CATH AND CORONARY ANGIOGRAPHY;  Surgeon: Yvonne Kendall, MD;  Location: [facility] INVASIVE CV LAB;  Service: Cardiovascular;  Laterality: N/A;    Current Outpatient Medications  Medication Sig Dispense Refill   amLODipine (NORVASC) 10 MG tablet TAKE 1 TABLET BY MOUTH DAILY 30 tablet 0   ASPIRIN LOW DOSE 81 MG EC tablet TAKE 1 TABLET BY MOUTH ONE TIME A DAY FOR CVA 30 tablet 5   atorvastatin (LIPITOR) 80 MG tablet TAKE 1 TABLET BY MOUTH DAILY 30 tablet 5   carvedilol (COREG) 25 MG tablet Take 1 tablet (25 mg total) by mouth 2 (two) times daily with a meal. 60 tablet  5   clopidogrel (PLAVIX) 75 MG tablet Take 1 tablet (75 mg total) by mouth daily. 30 tablet 5   cyclobenzaprine (FLEXERIL) 10 MG tablet Take 1 tablet by mouth at bedtime.     ezetimibe (ZETIA) 10 MG tablet TAKE 1 TABLET BY MOUTH DAILY 30 tablet 5   famotidine (PEPCID) 20 MG tablet TAKE 1 TABLET BY MOUTH TWICE A DAY RELATED TO GASTROESOPHAGEAL REFLUX DISEASE 60 tablet 6   FEROSUL 325 (65 Fe) MG tablet TAKE 1 TABLET BY MOUTH ONE TIME A DAY EVERY MONDAY, WEDNESDAY, AND FRIDAY  FOR IRON SUPPLEMENT 12 tablet 6   gabapentin (NEURONTIN) 300 MG capsule TAKE 1 CAPSULE BY MOUTH 3 TIMES A DAY FOR PAIN. DO NOT CRUSH 90 capsule 3   hydrALAZINE (APRESOLINE) 100 MG tablet TAKE 1 TABLET BY MOUTH IN THE MORNING, AT NOON AND AT BEDTIME 90 tablet 5   JARDIANCE 10 MG TABS tablet TAKE 1 TABLET BY MOUTH DAILY 30 tablet 2   levothyroxine (SYNTHROID) 50 MCG tablet Take 50 mcg by mouth daily before breakfast.     losartan (COZAAR) 50 MG tablet TAKE 1 TABLET BY MOUTH DAILY 90 tablet 1   Multiple Vitamin (MULTIVITAMIN ADULT PO) Take by mouth daily.     oxyCODONE (OXYCONTIN) 10 mg 12 hr tablet Take 10 mg by mouth every 12 (twelve) hours.      pantoprazole (PROTONIX) 40 MG tablet Take 40 mg by mouth 2 (two) times daily.     polyethylene glycol (MIRALAX / GLYCOLAX) 17 g packet Take 17 g by mouth daily. 14 each 0   senna (SENOKOT) 8.6 MG tablet Take 1 tablet by mouth daily.     No current facility-administered medications for this visit.     Allergies:   Patient has no known allergies.   Social History:  The patient  reports that she has never smoked. She has never used smokeless tobacco. She reports that she does not drink alcohol and does not use drugs.   Family History:   family history includes Diabetes in her mother; Hypertension in her mother and sister; Thyroid disease in her sister.    Review of Systems: ROS   PHYSICAL EXAM: VS:  BP (!) 142/60 (BP Location: Left Arm, Patient Position: Sitting, Cuff Size: Normal)   Pulse 60   Ht 5\' 6"  (1.676 m)   Wt 257 lb 8 oz (116.8 kg)   SpO2 96%   BMI 41.56 kg/m  , BMI Body mass index is 41.56 kg/m. GEN: Well nourished, well developed, in no acute distress HEENT: normal Neck: no JVD, carotid bruits, or masses Cardiac: RRR; no murmurs, rubs, or gallops,no edema  Respiratory:  clear to auscultation bilaterally, normal work of breathing GI: soft, nontender, nondistended, + BS MS: no deformity or atrophy Skin: warm and dry, no  rash Neuro:  Strength and sensation are intact Psych: euthymic mood, full affect    Recent Labs: 02/12/2020: B Natriuretic Peptide 320.8 02/14/2020: TSH 0.645 02/16/2020: Magnesium 2.2 03/05/2020: Hemoglobin 12.3; Platelets 349 07/15/2020: ALT 10; BUN 19; Creatinine, Ser 1.40; Potassium 4.9; Sodium 139    Lipid Panel Lab Results  Component Value Date   CHOL 152 07/15/2020   HDL 47 07/15/2020   LDLCALC 90 07/15/2020   TRIG 77 07/15/2020      Wt Readings from Last 3 Encounters:  10/15/20 257 lb 8 oz (116.8 kg)  07/24/20 258 lb (117 kg)  07/15/20 250 lb 4 oz (113.5 kg)       ASSESSMENT AND  PLAN:  Problem List Items Addressed This Visit       Cardiology Problems   Essential hypertension     Other   OSA (obstructive sleep apnea)   Other Visit Diagnoses     Coronary artery disease of native artery of native heart with stable angina pectoris (HCC)    -  Primary   Relevant Medications   cyclobenzaprine (FLEXERIL) 10 MG tablet   Chronic diastolic heart failure (HCC)       PAD (peripheral artery disease) (HCC)       Hyperlipidemia LDL goal <70          Diastolic CHF Ankle swelling, weight gain, abdominal distention, shortness of breath Appears that mistake was made on her discharge summary October 2021, was discharged on IV Lasix which she does not use -Recommend she take Lasix 20 mg oral dosing for weight over 250 pounds Recommend she cut back on her fluid intake  Morbid obesity We have encouraged continued exercise, careful diet management in an effort to lose weight.  Coronary artery disease with stable angina Lifestyle modification recommended, weight loss Goal LDL less than 70 Denies anginal symptoms, no further work-up at this time  COPD Recommend further weight loss, participation with pulmonary rehab    Total encounter time more than 35 minutes  Greater than 50% was spent in counseling and coordination of care with the patient    Signed, Dossie Arbour, M.D., Ph.D. [facility] [place], [place] [phone]

## 2020-10-15 NOTE — Patient Instructions (Addendum)
Medication Instructions:  Lasix Take 1 tablet (20 mg total) by mouth daily as needed For swelling or weight gain   If you need a refill on your cardiac medications before your next appointment, please call your pharmacy.   Lab work: No new labs needed  Testing/Procedures: No new testing needed   Follow-Up: At [facility], you and your health needs are our priority.  As part of our continuing mission to provide you with exceptional heart care, we have created designated Provider Care Teams.  These Care Teams include your primary Cardiologist (physician) and Advanced Practice Providers (APPs -  Physician Assistants and Nurse Practitioners) who all work together to provide you with the care you need, when you need it.  You will need a follow up appointment in 6 months  Providers on your designated Care Team:   Nicolasa Ducking, NP Eula Listen, PA-C Marisue Ivan, PA-C Cadence [place], [place]   COVID-19 Vaccine Information can be found at: [email or web address] For questions related to vaccine distribution or appointments, please email [email or web address] or call [phone].

## 2020-10-16 DIAGNOSIS — I5032 Chronic diastolic (congestive) heart failure: Secondary | ICD-10-CM

## 2020-10-16 NOTE — Progress Notes (Signed)
Daily Session Note  Patient Details  Name: Kimberly Mccarthy MRN: [id number] Date of Birth: Mar 07, 1956 Referring Provider:   Flowsheet Row Pulmonary Rehab from 07/24/2020 in [facility] Cardiac and Pulmonary Rehab  Referring Provider Gollan       Encounter Date: 10/16/2020  Check In:  Session Check In - 10/16/20 0933       Check-In   Supervising physician immediately available to respond to emergencies See telemetry face sheet for immediately available ER MD    Location [facility]-Cardiac & Pulmonary Rehab    Staff Present Birdie Sons, MPA, Elveria Rising, BA, ACSM CEP, Exercise Physiologist;Joseph Tessie Fass RCP,RRT,BSRT    Virtual Visit No    Medication changes reported     No    Fall or balance concerns reported    No    Tobacco Cessation No Change    Warm-up and Cool-down Performed on first and last piece of equipment    Resistance Training Performed Yes    VAD Patient? No    PAD/SET Patient? No      Pain Assessment   Currently in Pain? No/denies                Social History   Tobacco Use  Smoking Status Never  Smokeless Tobacco Never    Goals Met:  Independence with exercise equipment Exercise tolerated well No report of cardiac concerns or symptoms Strength training completed today  Goals Unmet:  Not Applicable  Comments: Pt able to follow exercise prescription today without complaint.  Will continue to monitor for progression.    Dr. Emily Filbert is Medical Director for [place].  Dr. Ottie Glazier is Medical Director for [facility] Pulmonary Rehabilitation.

## 2020-10-18 ENCOUNTER — Other Ambulatory Visit: Payer: Self-pay

## 2020-10-18 ENCOUNTER — Encounter: Payer: Medicare Other | Admitting: *Deleted

## 2020-10-18 DIAGNOSIS — I5032 Chronic diastolic (congestive) heart failure: Secondary | ICD-10-CM | POA: Diagnosis not present

## 2020-10-18 NOTE — Progress Notes (Signed)
Daily Session Note  Patient Details  Name: Kimberly Mccarthy MRN: [id number] Date of Birth: March 25, 1956 Referring Provider:   April Mccarthy Pulmonary Mccarthy from 07/24/2020 in [facility]  Referring Provider Kimberly Mccarthy       Encounter Date: 10/18/2020  Check In:  Session Check In - 10/18/20 Kimberly Mccarthy       Check-In   Supervising physician immediately available to respond to emergencies See telemetry face sheet for immediately available ER MD    Location [facility]    Staff Present Kimberly Papa, RN BSN;Kimberly Mccarthy, Kimberly Mccarthy, Kimberly Mccarthy, CCRP, CCET    Virtual Visit No    Medication changes reported     No    Fall or balance concerns reported    No    Warm-up and Cool-down Performed on first and last piece of equipment    Resistance Training Performed Yes    VAD Patient? No    PAD/SET Patient? No      Pain Assessment   Currently in Pain? No/denies                Social History   Tobacco Use  Smoking Status Never  Smokeless Tobacco Never    Goals Met:  Independence with exercise equipment Exercise tolerated well No report of cardiac concerns or symptoms Strength training completed today  Goals Unmet:  Not Applicable  Comments: Pt able to follow exercise prescription today without complaint.  Will continue to monitor for progression.    Dr. Emily Mccarthy is Medical Director for [facility].  Dr. Ottie Mccarthy is Medical Director for [facility] Pulmonary Rehabilitation.

## 2020-10-21 ENCOUNTER — Other Ambulatory Visit: Payer: Self-pay

## 2020-10-21 ENCOUNTER — Encounter: Payer: Medicare Other | Admitting: *Deleted

## 2020-10-21 DIAGNOSIS — I5032 Chronic diastolic (congestive) heart failure: Secondary | ICD-10-CM

## 2020-10-21 NOTE — Progress Notes (Signed)
Daily Session Note  Patient Details  Name: EMNET MONK MRN: [id number] Date of Birth: Jan 08, 1956 Referring Provider:   Flowsheet Row Pulmonary Rehab from 07/24/2020 in [facility] Cardiac and Pulmonary Rehab  Referring Provider Gollan       Encounter Date: 10/21/2020  Check In:  Session Check In - 10/21/20 6333       Check-In   Supervising physician immediately available to respond to emergencies See telemetry face sheet for immediately available ER MD    Location [facility]-Cardiac & Pulmonary Rehab    Staff Present Heath Lark, RN, BSN, Laveda Norman, BS, ACSM CEP, Exercise Physiologist;Joseph Tessie Fass RCP,RRT,BSRT    Virtual Visit No    Medication changes reported     No    Fall or balance concerns reported    No    Warm-up and Cool-down Performed on first and last piece of equipment    Resistance Training Performed Yes    VAD Patient? No    PAD/SET Patient? No      Pain Assessment   Currently in Pain? No/denies                Social History   Tobacco Use  Smoking Status Never  Smokeless Tobacco Never    Goals Met:  Proper associated with RPD/PD & O2 Sat Independence with exercise equipment Exercise tolerated well Personal goals reviewed No report of cardiac concerns or symptoms  Goals Unmet:  Not Applicable  Comments: Pt able to follow exercise prescription today without complaint.  Will continue to monitor for progression.    Dr. Emily Filbert is Medical Director for [place].  Dr. Ottie Glazier is Medical Director for [facility] Pulmonary Rehabilitation.

## 2020-10-23 ENCOUNTER — Other Ambulatory Visit: Payer: Self-pay

## 2020-10-23 DIAGNOSIS — I5032 Chronic diastolic (congestive) heart failure: Secondary | ICD-10-CM | POA: Diagnosis not present

## 2020-10-23 NOTE — Progress Notes (Signed)
Daily Session Note  Patient Details  Name: Kimberly Mccarthy MRN: [id number] Date of Birth: [birth year] Referring Provider:   Flowsheet Row Pulmonary Rehab from 07/24/2020 in [facility] Cardiac and Pulmonary Rehab  Referring Provider Gollan       Encounter Date: 10/23/2020  Check In:  Session Check In - 10/23/20 0935       Check-In   Supervising physician immediately available to respond to emergencies See telemetry face sheet for immediately available ER MD    Location [facility]-Cardiac & Pulmonary Rehab    Staff Present Birdie Sons, MPA, Nino Glow, MS, ASCM CEP, Exercise Physiologist;Kalasia Crafton, RN,BC,MSN;Joseph [place], [place]    Virtual Visit No    Medication changes reported     No    Fall or balance concerns reported    No    Warm-up and Cool-down Performed on first and last piece of equipment    Resistance Training Performed Yes    VAD Patient? No    PAD/SET Patient? No      Pain Assessment   Currently in Pain? No/denies                Social History   Tobacco Use  Smoking Status Never  Smokeless Tobacco Never    Goals Met:  Independence with exercise equipment Exercise tolerated well No report of cardiac concerns or symptoms Strength training completed today  Goals Unmet:  Not Applicable  Comments: Pt able to follow exercise prescription today without complaint.  Will continue to monitor for progression.    Dr. Emily Filbert is Medical Director for [place].  Dr. Ottie Glazier is Medical Director for [facility] Pulmonary Rehabilitation.

## 2020-10-25 ENCOUNTER — Encounter: Payer: Medicare Other | Attending: Cardiovascular Disease | Admitting: *Deleted

## 2020-10-25 ENCOUNTER — Other Ambulatory Visit: Payer: Self-pay

## 2020-10-25 DIAGNOSIS — I5032 Chronic diastolic (congestive) heart failure: Secondary | ICD-10-CM | POA: Insufficient documentation

## 2020-10-25 NOTE — Progress Notes (Signed)
Daily Session Note  Patient Details  Name: Kimberly Mccarthy MRN: [id number] Date of Birth: 11-09-1955 Referring Provider:   Flowsheet Row Pulmonary Rehab from 07/24/2020 in [facility] Cardiac and Pulmonary Rehab  Referring Provider Gollan       Encounter Date: 10/25/2020  Check In:  Session Check In - 10/25/20 0929       Check-In   Supervising physician immediately available to respond to emergencies See telemetry face sheet for immediately available ER MD    Location [facility]-Cardiac & Pulmonary Rehab    Staff Present Renita Papa, RN BSN;Jessica Luan [place], MA, RCEP, CCRP, CCET;Joseph [place], [place]    Virtual Visit No    Medication changes reported     No    Fall or balance concerns reported    No    Warm-up and Cool-down Performed on first and last piece of equipment    Resistance Training Performed Yes    VAD Patient? No    PAD/SET Patient? No      Pain Assessment   Currently in Pain? No/denies                Social History   Tobacco Use  Smoking Status Never  Smokeless Tobacco Never    Goals Met:  Independence with exercise equipment Exercise tolerated well No report of cardiac concerns or symptoms Strength training completed today  Goals Unmet:  Not Applicable  Comments: Pt able to follow exercise prescription today without complaint.  Will continue to monitor for progression.    Dr. Emily Filbert is Medical Director for [place].  Dr. Ottie Glazier is Medical Director for [facility] Pulmonary Rehabilitation.

## 2020-10-30 ENCOUNTER — Other Ambulatory Visit: Payer: Self-pay

## 2020-10-30 VITALS — Ht 66.0 in | Wt 258.4 lb

## 2020-10-30 DIAGNOSIS — I5032 Chronic diastolic (congestive) heart failure: Secondary | ICD-10-CM

## 2020-10-30 NOTE — Progress Notes (Signed)
Daily Session Note  Patient Details  Name: Kimberly Mccarthy MRN: [id number] Date of Birth: [birth year] Referring Provider:   Flowsheet Row Pulmonary Rehab from 07/24/2020 in [facility] Cardiac and Pulmonary Rehab  Referring Provider Gollan       Encounter Date: 10/30/2020  Check In:  Session Check In - 10/30/20 4270       Check-In   Supervising physician immediately available to respond to emergencies See telemetry face sheet for immediately available ER MD    Location [facility]-Cardiac & Pulmonary Rehab    Staff Present Birdie Sons, MPA, RN;Laureen Owens Shark, BS, RRT, CPFT;Joseph Tessie Fass, [place]    Virtual Visit No    Medication changes reported     No    Fall or balance concerns reported    No    Tobacco Cessation No Change    Warm-up and Cool-down Performed on first and last piece of equipment    Resistance Training Performed Yes    VAD Patient? No    PAD/SET Patient? No      Pain Assessment   Currently in Pain? No/denies                Social History   Tobacco Use  Smoking Status Never  Smokeless Tobacco Never    Goals Met:  Independence with exercise equipment Exercise tolerated well No report of cardiac concerns or symptoms Strength training completed today  Goals Unmet:  Not Applicable  Comments: Pt able to follow exercise prescription today without complaint.  Will continue to monitor for progression.    Dr. Emily Filbert is Medical Director for [place].  Dr. Ottie Glazier is Medical Director for [facility] Pulmonary Rehabilitation.

## 2020-11-01 ENCOUNTER — Other Ambulatory Visit: Payer: Self-pay

## 2020-11-01 ENCOUNTER — Other Ambulatory Visit: Payer: Self-pay | Admitting: *Deleted

## 2020-11-01 ENCOUNTER — Encounter: Payer: Medicare Other | Admitting: *Deleted

## 2020-11-01 DIAGNOSIS — I5032 Chronic diastolic (congestive) heart failure: Secondary | ICD-10-CM

## 2020-11-01 NOTE — Progress Notes (Signed)
Daily Session Note  Patient Details  Name: Kimberly Mccarthy MRN: [id number] Date of Birth: [birth year] Referring Provider:   Flowsheet Row Pulmonary Rehab from 07/24/2020 in [facility] Cardiac and Pulmonary Rehab  Referring Provider Gollan       Encounter Date: 11/01/2020  Check In:  Session Check In - 11/01/20 0914       Check-In   Supervising physician immediately available to respond to emergencies See telemetry face sheet for immediately available ER MD    Location [facility]-Cardiac & Pulmonary Rehab    Staff Present Renita Papa, RN BSN;Melissa Tilford Pillar, RDN, LDN;Jessica Luan Pulling, MA, RCEP, CCRP, CCET    Virtual Visit No    Medication changes reported     No    Fall or balance concerns reported    No    Warm-up and Cool-down Performed on first and last piece of equipment    Resistance Training Performed Yes    VAD Patient? No    PAD/SET Patient? No      Pain Assessment   Currently in Pain? No/denies                Social History   Tobacco Use  Smoking Status Never  Smokeless Tobacco Never    Goals Met:  Independence with exercise equipment Exercise tolerated well No report of cardiac concerns or symptoms Strength training completed today  Goals Unmet:  Not Applicable  Comments: Pt able to follow exercise prescription today without complaint.  Will continue to monitor for progression.    Dr. Emily Filbert is Medical Director for [facility].  Dr. Ottie Glazier is Medical Director for [facility] Pulmonary Rehabilitation.

## 2020-11-04 ENCOUNTER — Encounter: Payer: Medicare Other | Admitting: *Deleted

## 2020-11-04 ENCOUNTER — Other Ambulatory Visit: Payer: Self-pay

## 2020-11-04 DIAGNOSIS — I5032 Chronic diastolic (congestive) heart failure: Secondary | ICD-10-CM | POA: Diagnosis not present

## 2020-11-04 NOTE — Progress Notes (Signed)
Daily Session Note  Patient Details  Name: Kimberly Mccarthy MRN: [id number] Date of Birth: 1956/02/17 Referring Provider:   Flowsheet Row Pulmonary Rehab from 07/24/2020 in [facility] Cardiac and Pulmonary Rehab  Referring Provider Gollan       Encounter Date: 11/04/2020  Check In:  Session Check In - 11/04/20 0941       Check-In   Supervising physician immediately available to respond to emergencies See telemetry face sheet for immediately available ER MD    Location [facility]-Cardiac & Pulmonary Rehab    Staff Present Heath Lark, RN, BSN, CCRP;Kristen Coble, RN,BC,MSN;Kelly Sobieski, [place], ACSM CEP, Exercise Physiologist    Virtual Visit No    Medication changes reported     No    Fall or balance concerns reported    No    Warm-up and Cool-down Performed on first and last piece of equipment    Resistance Training Performed Yes    VAD Patient? No    PAD/SET Patient? No      Pain Assessment   Currently in Pain? No/denies                Social History   Tobacco Use  Smoking Status Never  Smokeless Tobacco Never    Goals Met:  Proper associated with RPD/PD & O2 Sat Independence with exercise equipment Exercise tolerated well No report of cardiac concerns or symptoms  Goals Unmet:  Not Applicable  Comments: Pt able to follow exercise prescription today without complaint.  Will continue to monitor for progression.    Dr. Emily Filbert is Medical Director for [facility].  Dr. Ottie Glazier is Medical Director for [facility] Pulmonary Rehabilitation.

## 2020-11-05 NOTE — Patient Instructions (Signed)
Discharge Patient Instructions  Patient Details  Name: Kimberly Mccarthy MRN: [id number] Date of Birth: Sep 24, 1955 Referring Provider:  [facility]   Number of Visits: 36  Reason for Discharge:  Patient reached a stable level of exercise. Patient independent in their exercise. Patient has met program and personal goals.  Smoking History:  Social History   Tobacco Use  Smoking Status Never  Smokeless Tobacco Never    Diagnosis:  Heart failure, diastolic, chronic (HCC)  Initial Exercise Prescription:  Initial Exercise Prescription - 07/24/20 1600       Date of Initial Exercise RX and Referring Provider   Date 07/24/20    Referring Provider Gollan      Treadmill   MPH 1    Grade 0    Minutes 15    METs 1      Recumbant Bike   Level 1    RPM 60    Minutes 15    METs 1      NuStep   Level 1    SPM 80    Minutes 15    METs 1      Arm Ergometer   Level 1    RPM 25    Minutes 15    METs 1      Recumbant Elliptical   Level 1    RPM 50    Minutes 15    METs 1      Biostep-RELP   Level 1    SPM 50    Minutes 15    METs 1      Prescription Details   Frequency (times per week) 3    Duration Progress to 30 minutes of continuous aerobic without signs/symptoms of physical distress      Intensity   THRR 40-80% of Max Heartrate 99-137    Ratings of Perceived Exertion 11-13    Perceived Dyspnea 0-4      Resistance Training   Training Prescription Yes    Weight 3 lb    Reps 10-15             Discharge Exercise Prescription (Final Exercise Prescription Changes):  Exercise Prescription Changes - 10/22/20 1500       Response to Exercise   Blood Pressure (Admit) 130/70    Blood Pressure (Exit) 110/74    Heart Rate (Admit) 71 bpm    Heart Rate (Exercise) 88 bpm    Heart Rate (Exit) 68 bpm    Oxygen Saturation (Admit) 92 %    Oxygen Saturation (Exercise) 90 %    Oxygen Saturation (Exit) 90 %    Rating of Perceived Exertion  (Exercise) 12    Perceived Dyspnea (Exercise) 1    Symptoms none    Duration Continue with 30 min of aerobic exercise without signs/symptoms of physical distress.    Intensity THRR unchanged      Progression   Progression Continue to progress workloads to maintain intensity without signs/symptoms of physical distress.    Average METs 1.75      Resistance Training   Training Prescription Yes    Weight 3 lb    Reps 10-15      Interval Training   Interval Training No      NuStep   Level 4    Minutes 30      Home Exercise Plan   Plans to continue exercise at Home (comment)   walking   Frequency Add 2 additional days to program exercise sessions.  Initial Home Exercises Provided 09/04/20             Functional Capacity:  6 Minute Walk     Row Name 07/24/20 1615 10/30/20 1146       6 Minute Walk   Phase -- Discharge    Distance 415 feet 395 feet    Distance % Change -- -0.5 %    Distance Feet Change -- -20 ft    Walk Time 4.5 minutes 5.3 minutes    # of Rest Breaks 3 2    MPH 1.04 0.84    METS 0.97 0.69    RPE 15 19    Perceived Dyspnea  1 3    VO2 Peak 3.4 2.42    Symptoms Yes (comment) Yes (comment)    Comments legs hurt 10/10 legs hurt 10/10    Resting HR 61 bpm 66 bpm    Resting BP 152/78 132/78    Resting Oxygen Saturation  95 % 91 %    Exercise Oxygen Saturation  during 6 min walk 88 % 89 %    Max Ex. HR 91 bpm 84 bpm    Max Ex. BP 160/68 134/70    2 Minute Post BP 144/66 --         Interval HR      1 Minute HR 87 --    2 Minute HR 84 --    3 Minute HR 88 --    4 Minute HR 91 --    5 Minute HR 85 --    6 Minute HR 91 --    2 Minute Post HR 72 --    Interval Heart Rate? Yes --         Interval Oxygen      Interval Oxygen? Yes --    Baseline Oxygen Saturation % 95 % --    1 Minute Oxygen Saturation % 90 % --    1 Minute Liters of Oxygen 0 L --    2 Minute Oxygen Saturation % 90 % --    2 Minute Liters of Oxygen 0 L --    3 Minute Oxygen  Saturation % 91 % --    3 Minute Liters of Oxygen 0 L --    4 Minute Oxygen Saturation % 91 % --    4 Minute Liters of Oxygen 0 L --    5 Minute Oxygen Saturation % 89 % --    5 Minute Liters of Oxygen 0 L --    6 Minute Oxygen Saturation % 88 % --    6 Minute Liters of Oxygen 0 L --    2 Minute Post Oxygen Saturation % 95 % --    2 Minute Post Liters of Oxygen 0 L --              Nutrition & Weight - Outcomes:  Pre Biometrics - 07/24/20 1625       Pre Biometrics   Height '5\' 6"'  (1.676 m)    Weight 258 lb (117 kg)    BMI (Calculated) 41.66             Post Biometrics - 10/30/20 1148        Post  Biometrics   Height '5\' 6"'  (1.676 m)    Weight 258 lb 6.4 oz (117.2 kg)    BMI (Calculated) 41.73             Nutrition:  Nutrition Therapy & Goals - 09/02/20 1019  Nutrition Therapy   Diet Heart healthy, low Na    Drug/Food Interactions Statins/Certain Fruits    Protein (specify units) 94g    Fiber 25 grams    Whole Grain Foods 3 servings    Saturated Fats 12 max. grams    Fruits and Vegetables 8 servings/day    Sodium 1.5 grams      Personal Nutrition Goals   Nutrition Goal ST: swap out some sweets with recipes given (like banana "nice" cream) LT: limit sweets to 2-3x/week    Comments She is already reducing her sodium choosing no salt added items. She uses olive oil. She likes vegetables and fruit. She has no salt added canned foods and she cooks beans from dried. She feels that she has peanut butter and chocolate cookies and icecream - she has got back on track now. She feels her biggest problem is sweets. She feels like she knows what she needs to do. Discussed heart healthy eating.      Intervention Plan   Intervention Prescribe, educate and counsel regarding individualized specific dietary modifications aiming towards targeted core components such as weight, hypertension, lipid management, diabetes, heart failure and other comorbidities.;Nutrition  handout(s) given to patient.    Expected Outcomes Short Term Goal: Understand basic principles of dietary content, such as calories, fat, sodium, cholesterol and nutrients.;Short Term Goal: A plan has been developed with personal nutrition goals set during dietitian appointment.;Long Term Goal: Adherence to prescribed nutrition plan.            Goals reviewed with patient; copy given to patient.

## 2020-11-06 ENCOUNTER — Encounter: Payer: Medicare Other | Admitting: *Deleted

## 2020-11-06 ENCOUNTER — Encounter: Payer: Self-pay | Admitting: *Deleted

## 2020-11-06 ENCOUNTER — Other Ambulatory Visit: Payer: Self-pay

## 2020-11-06 DIAGNOSIS — I5032 Chronic diastolic (congestive) heart failure: Secondary | ICD-10-CM

## 2020-11-06 NOTE — Progress Notes (Signed)
Daily Session Note  Patient Details  Name: Kimberly Mccarthy MRN: [id number] Date of Birth: Jul 16, 1955 Referring Provider:   Flowsheet Row Pulmonary Rehab from 07/24/2020 in [facility] Cardiac and Pulmonary Rehab  Referring Provider Gollan       Encounter Date: 11/06/2020  Check In:  Session Check In - 11/06/20 0953       Check-In   Supervising physician immediately available to respond to emergencies See telemetry face sheet for immediately available ER MD    Location [facility]-Cardiac & Pulmonary Rehab    Staff Present Birdie Sons, MPA, RN;Nusayba Cadenas, RN, BSN, CCRP;Melissa Caiola, RDN, LDN    Virtual Visit No    Medication changes reported     No    Fall or balance concerns reported    No    Warm-up and Cool-down Performed on first and last piece of equipment    Resistance Training Performed Yes    VAD Patient? No    PAD/SET Patient? No      Pain Assessment   Currently in Pain? No/denies                Social History   Tobacco Use  Smoking Status Never  Smokeless Tobacco Never    Goals Met:  Proper associated with RPD/PD & O2 Sat Independence with exercise equipment Exercise tolerated well Personal goals reviewed No report of cardiac concerns or symptoms  Goals Unmet:  Not Applicable  Comments:  Kimberly Mccarthy graduated today from  rehab with 36 sessions completed.  Details of the patient's exercise prescription and what sheneeds to do in order to continue the prescription and progress were discussed with patient.  Patient was given a copy of prescription and goals.  Patient verbalized understanding.  Kimberly Mccarthy plans to continue to exercise by joining the [employer].    Dr. Emily Filbert is Medical Director for [facility].  Dr. Ottie Glazier is Medical Director for [facility] Pulmonary Rehabilitation.

## 2020-11-06 NOTE — Progress Notes (Signed)
Pulmonary Individual Treatment Plan  Patient Details  Name: Kimberly Mccarthy MRN: [id number] Date of Birth: [birth year] Referring Provider:   Flowsheet Row Pulmonary Rehab from 07/24/2020 in [facility] Cardiac and Pulmonary Rehab  Referring Provider Gollan       Initial Encounter Date:  Flowsheet Row Pulmonary Rehab from 07/24/2020 in [facility] Cardiac and Pulmonary Rehab  Date 07/24/20       Visit Diagnosis: Heart failure, diastolic, chronic (HCC)  Patient's Home Medications on Admission:  Current Outpatient Medications:    amLODipine (NORVASC) 10 MG tablet, TAKE 1 TABLET BY MOUTH DAILY, Disp: 30 tablet, Rfl: 0   ASPIRIN LOW DOSE 81 MG EC tablet, TAKE 1 TABLET BY MOUTH ONE TIME A DAY FOR CVA, Disp: 30 tablet, Rfl: 5   atorvastatin (LIPITOR) 80 MG tablet, TAKE 1 TABLET BY MOUTH DAILY, Disp: 30 tablet, Rfl: 5   carvedilol (COREG) 25 MG tablet, Take 1 tablet (25 mg total) by mouth 2 (two) times daily with a meal., Disp: 60 tablet, Rfl: 5   clopidogrel (PLAVIX) 75 MG tablet, Take 1 tablet (75 mg total) by mouth daily., Disp: 30 tablet, Rfl: 5   cyclobenzaprine (FLEXERIL) 10 MG tablet, Take 1 tablet by mouth at bedtime., Disp: , Rfl:    ezetimibe (ZETIA) 10 MG tablet, TAKE 1 TABLET BY MOUTH DAILY, Disp: 30 tablet, Rfl: 5   famotidine (PEPCID) 20 MG tablet, TAKE 1 TABLET BY MOUTH TWICE A DAY RELATED TO GASTROESOPHAGEAL REFLUX DISEASE, Disp: 60 tablet, Rfl: 6   FEROSUL 325 (65 Fe) MG tablet, TAKE 1 TABLET BY MOUTH ONE TIME A DAY EVERY MONDAY, WEDNESDAY, AND FRIDAY FOR IRON SUPPLEMENT, Disp: 12 tablet, Rfl: 6   furosemide (LASIX) 20 MG tablet, Take 1 tablet (20 mg total) by mouth daily as needed., Disp: 90 tablet, Rfl: 3   gabapentin (NEURONTIN) 300 MG capsule, TAKE 1 CAPSULE BY MOUTH 3 TIMES A DAY FOR PAIN. DO NOT CRUSH, Disp: 90 capsule, Rfl: 3   hydrALAZINE (APRESOLINE) 100 MG tablet, TAKE 1 TABLET BY MOUTH IN THE MORNING, AT NOON AND AT BEDTIME, Disp: 90 tablet, Rfl: 5   JARDIANCE 10 MG TABS  tablet, TAKE 1 TABLET BY MOUTH DAILY, Disp: 30 tablet, Rfl: 2   levothyroxine (SYNTHROID) 50 MCG tablet, Take 50 mcg by mouth daily before breakfast., Disp: , Rfl:    losartan (COZAAR) 50 MG tablet, TAKE 1 TABLET BY MOUTH DAILY, Disp: 90 tablet, Rfl: 1   Multiple Vitamin (MULTIVITAMIN ADULT PO), Take by mouth daily., Disp: , Rfl:    oxyCODONE (OXYCONTIN) 10 mg 12 hr tablet, Take 10 mg by mouth every 12 (twelve) hours. , Disp: , Rfl:    pantoprazole (PROTONIX) 40 MG tablet, Take 40 mg by mouth 2 (two) times daily., Disp: , Rfl:    polyethylene glycol (MIRALAX / GLYCOLAX) 17 g packet, Take 17 g by mouth daily., Disp: 14 each, Rfl: 0   senna (SENOKOT) 8.6 MG tablet, Take 1 tablet by mouth daily., Disp: , Rfl:   Past Medical History: Past Medical History:  Diagnosis Date   Anemia    AVM (arteriovenous malformation)    Bronchitis    Carotid artery disease (HCC) 10/31/2015   Chronic respiratory failure (HCC)    COPD (chronic obstructive pulmonary disease) (HCC)    Critical ischemia of lower extremity ([place]) 07/18/2014   H/O blood clots    Hyperkalemia    Hypertension    Hypothyroidism 09/05/2014   Morbid obesity ([place])    PVD (peripheral vascular disease) ([place])  Renal failure    Stroke [facility])    may 2015   Thyroid disease     Tobacco Use: Social History   Tobacco Use  Smoking Status Never  Smokeless Tobacco Never    Labs: Recent Review Flowsheet Data     Labs for ITP Cardiac and Pulmonary Rehab Latest Ref Rng & Units 02/14/2020 02/15/2020 02/15/2020 02/16/2020 07/15/2020   Cholestrol 100 - 199 mg/dL - - 226(H) - 152   LDLCALC 0 - 99 mg/dL - - 147(H) - 90   HDL >39 mg/dL - - 41 - 47   Trlycerides 0 - 149 mg/dL 119 190(H) 192(H) 121 77   Hemoglobin A1c 4.8 - 5.6 % - - - - -   PHART 7.350 - 7.450 - - - - -   PCO2ART 32.0 - 48.0 mmHg - - - - -   HCO3 20.0 - 28.0 mmol/L - - - - -   ACIDBASEDEF 0.0 - 2.0 mmol/L - - - - -   O2SAT % - - - - -        Pulmonary Assessment  Scores:  Pulmonary Assessment Scores     Row Name 07/24/20 1626 11/01/20 0916       ADL UCSD   ADL Phase -- Exit    SOB Score total 14 44    Rest 0 0    Walk 1 2    Stairs 2 3    Bath 0 3    Dress 0 2    Shop 0 3         CAT Score      CAT Score 6 13         mMRC Score      mMRC Score 2 --            UCSD: Self-administered rating of dyspnea associated with activities of daily living (ADLs) 6-point scale (0 = "not at all" to 5 = "maximal or unable to do because of breathlessness")  Scoring Scores range from 0 to 120.  Minimally important difference is 5 units  CAT: CAT can identify the health impairment of COPD patients and is better correlated with disease progression.  CAT has a scoring range of zero to 40. The CAT score is classified into four groups of low (less than 10), medium (10 - 20), high (21-30) and very high (31-40) based on the impact level of disease on health status. A CAT score over 10 suggests significant symptoms.  A worsening CAT score could be explained by an exacerbation, poor medication adherence, poor inhaler technique, or progression of COPD or comorbid conditions.  CAT MCID is 2 points  mMRC: mMRC (Modified Medical Research Council) Dyspnea Scale is used to assess the degree of baseline functional disability in patients of respiratory disease due to dyspnea. No minimal important difference is established. A decrease in score of 1 point or greater is considered a positive change.   Pulmonary Function Assessment:   Exercise Target Goals: Exercise Program Goal: Individual exercise prescription set using results from initial 6 min walk test and THRR while considering  patient's activity barriers and safety.   Exercise Prescription Goal: Initial exercise prescription builds to 30-45 minutes a day of aerobic activity, 2-3 days per week.  Home exercise guidelines will be given to patient during program as part of exercise prescription that the  participant will acknowledge.  Education: Aerobic Exercise: - Group verbal and visual presentation on the components of exercise prescription. Introduces F.I.T.T principle  from ACSM for exercise prescriptions.  Reviews F.I.T.T. principles of aerobic exercise including progression. Written material given at graduation. Flowsheet Row Pulmonary Rehab from 10/16/2020 in [facility] Cardiac and Pulmonary Rehab  Date 08/14/20  Educator AS  Instruction Review Code 1- Verbalizes Understanding       Education: Resistance Exercise: - Group verbal and visual presentation on the components of exercise prescription. Introduces F.I.T.T principle from ACSM for exercise prescriptions  Reviews F.I.T.T. principles of resistance exercise including progression. Written material given at graduation. Flowsheet Row Pulmonary Rehab from 10/16/2020 in [facility] Cardiac and Pulmonary Rehab  Date 08/21/20  Educator AS  Instruction Review Code 1- Verbalizes Understanding        Education: Exercise & Equipment Safety: - Individual verbal instruction and demonstration of equipment use and safety with use of the equipment. Flowsheet Row Pulmonary Rehab from 10/16/2020 in [facility] Cardiac and Pulmonary Rehab  Date 07/24/20  Educator AS  Instruction Review Code 1- Verbalizes Understanding       Education: Exercise Physiology & General Exercise Guidelines: - Group verbal and written instruction with models to review the exercise physiology of the cardiovascular system and associated critical values. Provides general exercise guidelines with specific guidelines to those with heart or lung disease.  Flowsheet Row Pulmonary Rehab from 10/16/2020 in [facility] Cardiac and Pulmonary Rehab  Date 10/09/20  Educator AS  Instruction Review Code 1- Verbalizes Understanding       Education: Flexibility, Balance, Mind/Body Relaxation: - Group verbal and visual presentation with interactive activity on the components of exercise prescription.  Introduces F.I.T.T principle from ACSM for exercise prescriptions. Reviews F.I.T.T. principles of flexibility and balance exercise training including progression. Also discusses the mind body connection.  Reviews various relaxation techniques to help reduce and manage stress (i.e. Deep breathing, progressive muscle relaxation, and visualization). Balance handout provided to take home. Written material given at graduation. Flowsheet Row Pulmonary Rehab from 10/16/2020 in [facility] Cardiac and Pulmonary Rehab  Date 08/28/20  Educator AS  Instruction Review Code 1- Verbalizes Understanding       Activity Barriers & Risk Stratification:  Activity Barriers & Cardiac Risk Stratification - 07/19/20 1003       Activity Barriers & Cardiac Risk Stratification   Activity Barriers Arthritis;Joint Problems;Muscular Weakness;Balance Concerns;Assistive Device   cane and walker            6 Minute Walk:  6 Minute Walk     Row Name 07/24/20 1615 10/30/20 1146       6 Minute Walk   Phase -- Discharge    Distance 415 feet 395 feet    Distance % Change -- -0.5 %    Distance Feet Change -- -20 ft    Walk Time 4.5 minutes 5.3 minutes    # of Rest Breaks 3 2    MPH 1.04 0.84    METS 0.97 0.69    RPE 15 19    Perceived Dyspnea  1 3    VO2 Peak 3.4 2.42    Symptoms Yes (comment) Yes (comment)    Comments legs hurt 10/10 legs hurt 10/10    Resting HR 61 bpm 66 bpm    Resting BP 152/78 132/78    Resting Oxygen Saturation  95 % 91 %    Exercise Oxygen Saturation  during 6 min walk 88 % 89 %    Max Ex. HR 91 bpm 84 bpm    Max Ex. BP 160/68 134/70    2 Minute Post BP 144/66 --  Interval HR      1 Minute HR 87 --    2 Minute HR 84 --    3 Minute HR 88 --    4 Minute HR 91 --    5 Minute HR 85 --    6 Minute HR 91 --    2 Minute Post HR 72 --    Interval Heart Rate? Yes --         Interval Oxygen      Interval Oxygen? Yes --    Baseline Oxygen Saturation % 95 % --    1 Minute Oxygen  Saturation % 90 % --    1 Minute Liters of Oxygen 0 L --    2 Minute Oxygen Saturation % 90 % --    2 Minute Liters of Oxygen 0 L --    3 Minute Oxygen Saturation % 91 % --    3 Minute Liters of Oxygen 0 L --    4 Minute Oxygen Saturation % 91 % --    4 Minute Liters of Oxygen 0 L --    5 Minute Oxygen Saturation % 89 % --    5 Minute Liters of Oxygen 0 L --    6 Minute Oxygen Saturation % 88 % --    6 Minute Liters of Oxygen 0 L --    2 Minute Post Oxygen Saturation % 95 % --    2 Minute Post Liters of Oxygen 0 L --           Oxygen Initial Assessment:  Oxygen Initial Assessment - 07/19/20 1004       Home Oxygen   Home Oxygen Device None    Sleep Oxygen Prescription None    Home Exercise Oxygen Prescription None    Home Resting Oxygen Prescription None      Initial 6 min Walk   Oxygen Used None      Program Oxygen Prescription   Program Oxygen Prescription None      Intervention   Short Term Goals To learn and understand importance of maintaining oxygen saturations>88%;To learn and understand importance of monitoring SPO2 with pulse oximeter and demonstrate accurate use of the pulse oximeter.;To learn and demonstrate proper pursed lip breathing techniques or other breathing techniques. ;To learn and demonstrate proper use of respiratory medications    Long  Term Goals Verbalizes importance of monitoring SPO2 with pulse oximeter and return demonstration;Maintenance of O2 saturations>88%;Exhibits proper breathing techniques, such as pursed lip breathing or other method taught during program session;Compliance with respiratory medication;Demonstrates proper use of MDI's             Oxygen Re-Evaluation:  Oxygen Re-Evaluation     Row Name 08/05/20 1147 08/30/20 0927 10/21/20 0943         Program Oxygen Prescription   Program Oxygen Prescription None None None           Home Oxygen       Home Oxygen Device None None None     Sleep Oxygen Prescription None None  None     Home Exercise Oxygen Prescription None None None     Home Resting Oxygen Prescription None None None     Compliance with Home Oxygen Use -- Yes Yes           Goals/Expected Outcomes       Short Term Goals To learn and understand importance of maintaining oxygen saturations>88%;To learn and understand importance of monitoring SPO2 with pulse oximeter and  demonstrate accurate use of the pulse oximeter.;To learn and demonstrate proper pursed lip breathing techniques or other breathing techniques. ;To learn and demonstrate proper use of respiratory medications To learn and understand importance of maintaining oxygen saturations>88% To learn and understand importance of maintaining oxygen saturations>88%     Long  Term Goals Verbalizes importance of monitoring SPO2 with pulse oximeter and return demonstration;Maintenance of O2 saturations>88%;Exhibits proper breathing techniques, such as pursed lip breathing or other method taught during program session;Compliance with respiratory medication;Demonstrates proper use of MDI's Maintenance of O2 saturations>88% Maintenance of O2 saturations>88%     Comments Reviewed PLB technique with pt.  Talked about how it works and it's importance in maintaining their exercise saturations. She has a pulse oximeter to check her oxygen saturation at home. Informed her  and explained why it is important to have one. Reviewed that oxygen saturations should be 88 percent and above. She states that her oxygen at home is in the mid 90s. Sahvanna works on PLB at home and has no questions on how to do so. Joshua continues to monitor oxygen at home.  She has no other concerns regarding oxygen     Goals/Expected Outcomes Short: Become more profiecient at using PLB.   Long: Become independent at using PLB. Short: monitor oxygen at home with exertion. Long: maintain oxygen saturations above 88 percent independently. Short: continue to monitor oxygen at home Long: maintain oxygen  aboev 88%             Oxygen Discharge (Final Oxygen Re-Evaluation):  Oxygen Re-Evaluation - 10/21/20 0943       Program Oxygen Prescription   Program Oxygen Prescription None      Home Oxygen   Home Oxygen Device None    Sleep Oxygen Prescription None    Home Exercise Oxygen Prescription None    Home Resting Oxygen Prescription None    Compliance with Home Oxygen Use Yes      Goals/Expected Outcomes   Short Term Goals To learn and understand importance of maintaining oxygen saturations>88%    Long  Term Goals Maintenance of O2 saturations>88%    Comments Keshara continues to monitor oxygen at home.  She has no other concerns regarding oxygen    Goals/Expected Outcomes Short: continue to monitor oxygen at home Long: maintain oxygen aboev 88%             Initial Exercise Prescription:  Initial Exercise Prescription - 07/24/20 1600       Date of Initial Exercise RX and Referring Provider   Date 07/24/20    Referring Provider Gollan      Treadmill   MPH 1    Grade 0    Minutes 15    METs 1      Recumbant Bike   Level 1    RPM 60    Minutes 15    METs 1      NuStep   Level 1    SPM 80    Minutes 15    METs 1      Arm Ergometer   Level 1    RPM 25    Minutes 15    METs 1      Recumbant Elliptical   Level 1    RPM 50    Minutes 15    METs 1      Biostep-RELP   Level 1    SPM 50    Minutes 15    METs 1  Prescription Details   Frequency (times per week) 3    Duration Progress to 30 minutes of continuous aerobic without signs/symptoms of physical distress      Intensity   THRR 40-80% of Max Heartrate 99-137    Ratings of Perceived Exertion 11-13    Perceived Dyspnea 0-4      Resistance Training   Training Prescription Yes    Weight 3 lb    Reps 10-15             Perform Capillary Blood Glucose checks as needed.  Exercise Prescription Changes:   Exercise Prescription Changes     Row Name 07/24/20 1600 08/14/20 1700  08/28/20 0800 09/04/20 0900 09/11/20 1500     Response to Exercise   Blood Pressure (Admit) 152/78 142/80 146/74 -- 124/58   Blood Pressure (Exercise) 160/68 124/82 144/66 -- 118/78   Blood Pressure (Exit) 144/66 130/82 110/60 -- 126/72   Heart Rate (Admit) 61 bpm 79 bpm 72 bpm -- 66 bpm   Heart Rate (Exercise) 91 bpm 90 bpm 76 bpm -- 78 bpm   Heart Rate (Exit) 72 bpm 69 bpm 64 bpm -- 63 bpm   Oxygen Saturation (Admit) 95 % 93 % 98 % -- 94 %   Oxygen Saturation (Exercise) 88 % 88 % 90 % -- 92 %   Oxygen Saturation (Exit) 95 % 92 % 98 % -- 93 %   Rating of Perceived Exertion (Exercise) _0 -- 13   Perceived Dyspnea (Exercise) 1 -- 3 -- 0   Symptoms hips hurt second day -- -- none   Duration -- Progress to 30 minutes of  aerobic without signs/symptoms of physical distress Progress to 30 minutes of  aerobic without signs/symptoms of physical distress -- Continue with 30 min of aerobic exercise without signs/symptoms of physical distress.   Intensity -- THRR unchanged THRR unchanged -- THRR unchanged     Progression   Progression -- Continue to progress workloads to maintain intensity without signs/symptoms of physical distress. Continue to progress workloads to maintain intensity without signs/symptoms of physical distress. -- Continue to progress workloads to maintain intensity without signs/symptoms of physical distress.   Average METs -- 1.7 1.85 -- 1.85     Resistance Training   Training Prescription -- Yes Yes -- Yes   Weight -- 3 lb 3 lb -- 3 lb   Reps -- 10-15 10-15 -- 10-15     Interval Training   Interval Training -- -- -- -- No     Treadmill   MPH -- 1 -- -- --   Grade -- 0 -- -- --   Minutes -- 15 -- -- --   METs -- 1.77 -- -- --     NuStep   Level -- -- 1 -- 3   Minutes -- -- 15 -- 30   METs -- -- 1.7 -- 1.9     T5 Nustep   Level -- 1 -- -- --   Minutes -- 15 -- -- --     Biostep-RELP   Level -- -- 1 -- --   Minutes -- -- 15 -- --   METs -- -- 2 -- --      Home Exercise Plan   Plans to continue exercise at -- -- -- Home (comment)  walking Home (comment)  walking   Frequency -- -- -- Add 2 additional days to program exercise sessions. Add 2 additional days to program exercise sessions.   Initial Home Exercises  Provided -- -- -- 09/04/20 09/04/20    Row Name 09/26/20 1100 10/09/20 1100 10/22/20 1500         Response to Exercise   Blood Pressure (Admit) 132/60 118/62 130/70     Blood Pressure (Exercise) -- 126/62 --     Blood Pressure (Exit) 122/82 128/82 110/74     Heart Rate (Admit) 73 bpm 65 bpm 71 bpm     Heart Rate (Exercise) 76 bpm 74 bpm 88 bpm     Heart Rate (Exit) 70 bpm 60 bpm 68 bpm     Oxygen Saturation (Admit) 91 % 92 % 92 %     Oxygen Saturation (Exercise) 85 % 87 % 90 %     Oxygen Saturation (Exit) 89 % 90 % 90 %     Rating of Perceived Exertion (Exercise) _0 Perceived Dyspnea (Exercise) _1 Symptoms none none none     Duration Continue with 30 min of aerobic exercise without signs/symptoms of physical distress. Continue with 30 min of aerobic exercise without signs/symptoms of physical distress. Continue with 30 min of aerobic exercise without signs/symptoms of physical distress.     Intensity THRR unchanged THRR unchanged THRR unchanged           Progression       Progression Continue to progress workloads to maintain intensity without signs/symptoms of physical distress. Continue to progress workloads to maintain intensity without signs/symptoms of physical distress. Continue to progress workloads to maintain intensity without signs/symptoms of physical distress.     Average METs 1.4 1.89 1.75           Resistance Training       Training Prescription Yes Yes Yes     Weight 3 lb 3 lb 3 lb     Reps 10-15 10-15 10-15           Interval Training       Interval Training No No No           NuStep       Level _2 Minutes _3 METs 1.4 1.7 --           T5 Nustep       Level -- 1 --      Minutes -- 15 --     METs -- 2 --           Biostep-RELP       Level -- 1 --     Minutes -- 30 --     METs -- 2 --           Home Exercise Plan       Plans to continue exercise at Home (comment)  walking Home (comment)  walking Home (comment)  walking     Frequency Add 2 additional days to program exercise sessions. Add 2 additional days to program exercise sessions. Add 2 additional days to program exercise sessions.     Initial Home Exercises Provided 09/04/20 09/04/20 09/04/20             Exercise Comments:   Exercise Goals and Review:   Exercise Goals     Row Name 07/24/20 1625             Exercise Goals   Increase Physical Activity Yes       Intervention Provide advice, education, support and counseling about physical  activity/exercise needs.;Develop an individualized exercise prescription for aerobic and resistive training based on initial evaluation findings, risk stratification, comorbidities and participant's personal goals.       Expected Outcomes Short Term: Attend rehab on a regular basis to increase amount of physical activity.;Long Term: Add in home exercise to make exercise part of routine and to increase amount of physical activity.;Long Term: Exercising regularly at least 3-5 days a week.       Increase Strength and Stamina Yes       Intervention Provide advice, education, support and counseling about physical activity/exercise needs.;Develop an individualized exercise prescription for aerobic and resistive training based on initial evaluation findings, risk stratification, comorbidities and participant's personal goals.       Expected Outcomes Short Term: Increase workloads from initial exercise prescription for resistance, speed, and METs.;Short Term: Perform resistance training exercises routinely during rehab and add in resistance training at home;Long Term: Improve cardiorespiratory fitness, muscular endurance and strength as measured by increased  METs and functional capacity (6MWT)       Able to understand and use rate of perceived exertion (RPE) scale Yes       Intervention Provide education and explanation on how to use RPE scale       Expected Outcomes Short Term: Able to use RPE daily in rehab to express subjective intensity level;Long Term:  Able to use RPE to guide intensity level when exercising independently       Able to understand and use Dyspnea scale Yes       Intervention Provide education and explanation on how to use Dyspnea scale       Expected Outcomes Short Term: Able to use Dyspnea scale daily in rehab to express subjective sense of shortness of breath during exertion;Long Term: Able to use Dyspnea scale to guide intensity level when exercising independently       Knowledge and understanding of Target Heart Rate Range (THRR) Yes       Intervention Provide education and explanation of THRR including how the numbers were predicted and where they are located for reference       Expected Outcomes Short Term: Able to state/look up THRR;Short Term: Able to use daily as guideline for intensity in rehab;Long Term: Able to use THRR to govern intensity when exercising independently       Able to check pulse independently Yes       Intervention Provide education and demonstration on how to check pulse in carotid and radial arteries.;Review the importance of being able to check your own pulse for safety during independent exercise       Expected Outcomes Short Term: Able to explain why pulse checking is important during independent exercise;Long Term: Able to check pulse independently and accurately       Understanding of Exercise Prescription Yes       Intervention Provide education, explanation, and written materials on patient's individual exercise prescription       Expected Outcomes Short Term: Able to explain program exercise prescription;Long Term: Able to explain home exercise prescription to exercise independently                 Exercise Goals Re-Evaluation :  Exercise Goals Re-Evaluation     Row Name 08/05/20 1146 08/14/20 1742 08/28/20 0834 09/04/20 0930 09/11/20 1505     Exercise Goal Re-Evaluation   Exercise Goals Review Increase Physical Activity;Able to understand and use rate of perceived exertion (RPE) scale;Understanding of Exercise Prescription;Increase Strength and Stamina;Able to  check pulse independently Increase Physical Activity;Increase Strength and Stamina Increase Physical Activity;Increase Strength and Stamina Increase Physical Activity;Increase Strength and Stamina;Able to understand and use rate of perceived exertion (RPE) scale;Able to understand and use Dyspnea scale;Knowledge and understanding of Target Heart Rate Range (THRR);Able to check pulse independently;Understanding of Exercise Prescription Increase Physical Activity;Increase Strength and Stamina;Understanding of Exercise Prescription   Comments Reviewed RPE and dyspnea scales, THR and program prescription with pt today.  Pt voiced understanding and was given a copy of goals to take home. Ashonti rated the TM a 20 for RPE.  She is just starting to exercise so staff wil encourage her to rest if needed and build up to 15 min. Brithany tried walking the track.  She still reported RPE of 17.  Staff will monitor progress. Reviewed home exercise with pt today.  Pt plans to walk at home for exercise.  Reviewed THR, pulse, RPE, sign and symptoms, pulse oximetery and when to call 911 or MD.  Also discussed weather considerations and indoor options.  Pt voiced understanding. Kevonna has been doing well in rehab.  She is up to 30 min on the NuStep.  She does not like the BioStep and not comfortable walking.  We will continue to monitor his progress.   Expected Outcomes Short: Use RPE daily to regulate intensity. Long: Follow program prescription in THR. Short: work towards 15 min on TM Long : build overall stamina Short:  continue to work on walking   Long:  be able to walk 15 minutes Short:  continue to work on walking  Long:  be able to walk 15 minutes Short: Continue move up workload Long: Continue to walk at home    [place] Name 09/20/20 0926 09/26/20 1128 10/09/20 1139 10/21/20 0933       Exercise Goal Re-Evaluation   Exercise Goals Review Increase Physical Activity;Increase Strength and Stamina;Understanding of Exercise Prescription;Knowledge and understanding of Target Heart Rate Range (THRR) Increase Physical Activity;Increase Strength and Stamina Increase Physical Activity;Increase Strength and Stamina Increase Physical Activity;Increase Strength and Stamina    Comments Paije is doing well. She tries to walk at the grocery store instead of riding in a chair which she is proud of. She tries to exercise at home on the off days from rehab. Encouraged to keep checking her HR and O2 during exercise. She is aware of  her THR and is encouraged to try to hit her goal. Meriel has attended consistently this month.  Staff work with Venetia Night on PLB and making sure oxygen stays above 88%.  We will continue to monitor progress. Sammie has been using the NuStep and Biostep for full sessions and is now up to level 2 on the NuStep. Will continue to monitor progress. Meylin would like to go to the [place] whe she completes LW.  She was given phone number to contatct them.  Also let her know they have a scholarship program.    Expected Outcomes Short: Continue to watch HR and O2 at home Long: Exercise at home with appropriate prescription Short: maintain consistent attendance Long: improve stamina Short: Increase loads when appropriate Long: Continue to increase overall MET level Short: contact [facility] to get set up to start Long:  maintain exercise on her own             Discharge Exercise Prescription (Final Exercise Prescription Changes):  Exercise Prescription Changes - 10/22/20 1500       Response to Exercise   Blood Pressure (Admit) 130/70  Blood  Pressure (Exit) 110/74    Heart Rate (Admit) 71 bpm    Heart Rate (Exercise) 88 bpm    Heart Rate (Exit) 68 bpm    Oxygen Saturation (Admit) 92 %    Oxygen Saturation (Exercise) 90 %    Oxygen Saturation (Exit) 90 %    Rating of Perceived Exertion (Exercise) 12    Perceived Dyspnea (Exercise) 1    Symptoms none    Duration Continue with 30 min of aerobic exercise without signs/symptoms of physical distress.    Intensity THRR unchanged      Progression   Progression Continue to progress workloads to maintain intensity without signs/symptoms of physical distress.    Average METs 1.75      Resistance Training   Training Prescription Yes    Weight 3 lb    Reps 10-15      Interval Training   Interval Training No      NuStep   Level 4    Minutes 30      Home Exercise Plan   Plans to continue exercise at Home (comment)   walking   Frequency Add 2 additional days to program exercise sessions.    Initial Home Exercises Provided 09/04/20             Nutrition:  Target Goals: Understanding of nutrition guidelines, daily intake of sodium '1500mg'$ , cholesterol '200mg'$ , calories 30% from fat and 7% or less from saturated fats, daily to have 5 or more servings of fruits and vegetables.  Education: All About Nutrition: -Group instruction provided by verbal, written material, interactive activities, discussions, models, and posters to present general guidelines for heart healthy nutrition including fat, fiber, MyPlate, the role of sodium in heart healthy nutrition, utilization of the nutrition label, and utilization of this knowledge for meal planning. Follow up email sent as well. Written material given at graduation. Flowsheet Row Pulmonary Rehab from 10/16/2020 in [facility] Cardiac and Pulmonary Rehab  Date 09/04/20  Educator [facility]  Instruction Review Code 1- Verbalizes Understanding       Biometrics:  Pre Biometrics - 07/24/20 1625       Pre Biometrics   Height $Remov'5\' 6"'zuvzul$  (1.676 m)     Weight 258 lb (117 kg)    BMI (Calculated) 41.66             Post Biometrics - 10/30/20 1148        Post  Biometrics   Height $Remov'5\' 6"'WjBtrf$  (1.676 m)    Weight 258 lb 6.4 oz (117.2 kg)    BMI (Calculated) 41.73             Nutrition Therapy Plan and Nutrition Goals:  Nutrition Therapy & Goals - 09/02/20 1019       Nutrition Therapy   Diet Heart healthy, low Na    Drug/Food Interactions Statins/Certain Fruits    Protein (specify units) 94g    Fiber 25 grams    Whole Grain Foods 3 servings    Saturated Fats 12 max. grams    Fruits and Vegetables 8 servings/day    Sodium 1.5 grams      Personal Nutrition Goals   Nutrition Goal ST: swap out some sweets with recipes given (like banana "nice" cream) LT: limit sweets to 2-3x/week    Comments She is already reducing her sodium choosing no salt added items. She uses olive oil. She likes vegetables and fruit. She has no salt added canned foods and she cooks beans from dried. She feels  that she has peanut butter and chocolate cookies and icecream - she has got back on track now. She feels her biggest problem is sweets. She feels like she knows what she needs to do. Discussed heart healthy eating.      Intervention Plan   Intervention Prescribe, educate and counsel regarding individualized specific dietary modifications aiming towards targeted core components such as weight, hypertension, lipid management, diabetes, heart failure and other comorbidities.;Nutrition handout(s) given to patient.    Expected Outcomes Short Term Goal: Understand basic principles of dietary content, such as calories, fat, sodium, cholesterol and nutrients.;Short Term Goal: A plan has been developed with personal nutrition goals set during dietitian appointment.;Long Term Goal: Adherence to prescribed nutrition plan.             Nutrition Assessments:  MEDIFICTS Score Key: ?70 Need to make dietary changes  40-70 Heart Healthy Diet ? 40 Therapeutic Level  Cholesterol Diet  Flowsheet Row Pulmonary Rehab from 11/01/2020 in [facility] Cardiac and Pulmonary Rehab  Picture Your Plate Total Score on Discharge 58      Picture Your Plate Scores: <71 Unhealthy dietary pattern with much room for improvement. 41-50 Dietary pattern unlikely to meet recommendations for good health and room for improvement. 51-60 More healthful dietary pattern, with some room for improvement.  >60 Healthy dietary pattern, although there may be some specific behaviors that could be improved.   Nutrition Goals Re-Evaluation:  Nutrition Goals Re-Evaluation     [place] Name 08/30/20 0931 09/20/20 0933 10/21/20 0936         Goals   Current Weight 256 lb (116.1 kg) -- --     Nutrition Goal Meet with the dietician ST: swap out some sweets with recipes given (like banana "nice" cream) LT: limit sweets to 2-3x/week --     Comment Lynae loves potato chips and wants to work on intaking less. She is meeting with the dietician on Monday. Samyiah is doing well. She has cut out alcohol from her diet and her "sweets" is her to-go.  She is opting to use a more "light" icecream and was encouraged to check the nutrition label for sugar intake. She is trying Joaquina states her worse problem is potato chips.  We discussed trying other options with lower sodium or buying items in smaller portions.     Expected Outcome Short: meet with the dietician. Long: maintain a diet that pertains to her needs. Short: Continue to reach goals established by RD Long: Continue to eat heart healthy diet Short:  try Skinny pop or other type chips Long: maintain heart healthy diet              Nutrition Goals Discharge (Final Nutrition Goals Re-Evaluation):  Nutrition Goals Re-Evaluation - 10/21/20 0936       Goals   Comment Verginia states her worse problem is potato chips.  We discussed trying other options with lower sodium or buying items in smaller portions.    Expected Outcome Short:  try Skinny pop or other  type chips Long: maintain heart healthy diet             Psychosocial: Target Goals: Acknowledge presence or absence of significant depression and/or stress, maximize coping skills, provide positive support system. Participant is able to verbalize types and ability to use techniques and skills needed for reducing stress and depression.   Education: Stress, Anxiety, and Depression - Group verbal and visual presentation to define topics covered.  Reviews how body is impacted by stress,  anxiety, and depression.  Also discusses healthy ways to reduce stress and to treat/manage anxiety and depression.  Written material given at graduation. Flowsheet Row Pulmonary Rehab from 10/16/2020 in [facility] Cardiac and Pulmonary Rehab  Date 10/02/20  Educator [facility]  Instruction Review Code 1- [employer] Understanding       Education: Sleep Hygiene -Provides group verbal and written instruction about how sleep can affect your health.  Define sleep hygiene, discuss sleep cycles and impact of sleep habits. Review good sleep hygiene tips.    Initial Review & Psychosocial Screening:  Initial Psych Review & Screening - 07/19/20 1006       Initial Review   Current issues with Current Stress Concerns    Source of Stress Concerns Unable to participate in former interests or hobbies;Unable to perform yard/household activities;Chronic Illness      Family Dynamics   Good Support System? Yes   sister; family     Barriers   Psychosocial barriers to participate in program There are no identifiable barriers or psychosocial needs.;The patient should benefit from training in stress management and relaxation.      Screening Interventions   Interventions Encouraged to exercise;To provide support and resources with identified psychosocial needs;Provide feedback about the scores to participant    Expected Outcomes Short Term goal: Utilizing psychosocial counselor, staff and physician to assist with identification of  specific Stressors or current issues interfering with healing process. Setting desired goal for each stressor or current issue identified.;Long Term Goal: Stressors or current issues are controlled or eliminated.;Short Term goal: Identification and review with participant of any Quality of Life or Depression concerns found by scoring the questionnaire.;Long Term goal: The participant improves quality of Life and PHQ9 Scores as seen by post scores and/or verbalization of changes             Quality of Life Scores:  Scores of 19 and below usually indicate a poorer quality of life in these areas.  A difference of  2-3 points is a clinically meaningful difference.  A difference of 2-3 points in the total score of the Quality of Life Index has been associated with significant improvement in overall quality of life, self-image, physical symptoms, and general health in studies assessing change in quality of life.  PHQ-9: Recent Review Flowsheet Data     Depression screen [facility] 2/9 11/01/2020 07/24/2020 08/30/2015 08/01/2015 07/02/2015   Decreased Interest 2 0 0 0 0   Down, Depressed, Hopeless 0 0 0 0 0   PHQ - 2 Score 2 0 0 0 0   Altered sleeping 0 0 - - -   Tired, decreased energy 0 0 - - -   Change in appetite 0 0 - - -   Feeling bad or failure about yourself  0 0 - - -   Trouble concentrating 0 0 - - -   Moving slowly or fidgety/restless 0 0 - - -   Suicidal thoughts 0 0 - - -   PHQ-9 Score 2 0 - - -   Difficult doing work/chores Somewhat difficult - - - -      Interpretation of Total Score  Total Score Depression Severity:  1-4 = Minimal depression, 5-9 = Mild depression, 10-14 = Moderate depression, 15-19 = Moderately severe depression, 20-27 = Severe depression   Psychosocial Evaluation and Intervention:  Psychosocial Evaluation - 07/19/20 1011       Psychosocial Evaluation & Interventions   Interventions Encouraged to exercise with the program and follow exercise  prescription     Comments Ms. Villada is coming to Pulmonary Rehab with diastolic heart failure. She states her breathing is her main source of stress and she is tired of feeling short of breath. Her family is her main support system and lives close by to help with things. She did get referred in December but wanted to wait until it got warmer to start pulmonary rehab, in the meantime she had been working on her diet and has lost some weight which helps her feel better. She didn't report any other stressors and states she just takes it day by day.    Expected Outcomes Short: attend Pulmonary Rehab for education and exercise. Long: develop positive self care habits.    Continue Psychosocial Services  Follow up required by staff             Psychosocial Re-Evaluation:  Psychosocial Re-Evaluation     [place] Name 08/30/20 0933 09/20/20 0940 10/21/20 0939         Psychosocial Re-Evaluation   Current issues with Current Depression Current Depression Current Depression     Comments If Tytionna stays in the house to long she feels like she gets depressed. She goes to the store and back to the house mostly. Exercise is helping her get out of the house and helps with her stress. Saudia is doing well. She is managing her depression well. She states if she stays home for a long period of time she feels depressed. Not sure if she is on a medication but does not feel like she needs to chane anything. She is trying to go for walks to help give herself a break and feels it helps her. She has good support from her family. Sleep is good. Azriel states she fels pretty good.  She doesnt like to stay cooped up for long periods of time.  She wants to go to the [facility] to exercise so she can get out and meet people.     Expected Outcomes Short: Continue to exercise regularly to support mental health and notify staff of any changes. Long: maintain mental health and well being through teaching of rehab or prescribed medications independently.  Short: Continue attending rehab Long: Utilize exericse for stress management and maintain positive attitude Short: complete LW  Long: maintain exercise on her own     Interventions Encouraged to attend Pulmonary Rehabilitation for the exercise Encouraged to attend Pulmonary Rehabilitation for the exercise --     Continue Psychosocial Services  Follow up required by staff Follow up required by staff --              Psychosocial Discharge (Final Psychosocial Re-Evaluation):  Psychosocial Re-Evaluation - 10/21/20 0939       Psychosocial Re-Evaluation   Current issues with Current Depression    Comments Camaya states she fels pretty good.  She doesnt like to stay cooped up for long periods of time.  She wants to go to the [facility] to exercise so she can get out and meet people.    Expected Outcomes Short: complete LW  Long: maintain exercise on her own             Education: Education Goals: Education classes will be provided on a weekly basis, covering required topics. Participant will state understanding/return demonstration of topics presented.  Learning Barriers/Preferences:  Learning Barriers/Preferences - 07/19/20 1014       Learning Barriers/Preferences   Learning Barriers None    Learning Preferences None  General Pulmonary Education Topics:  Infection Prevention: - Provides verbal and written material to individual with discussion of infection control including proper hand washing and proper equipment cleaning during exercise session. Flowsheet Row Pulmonary Rehab from 10/16/2020 in [facility] Cardiac and Pulmonary Rehab  Date 07/24/20  Educator AS  Instruction Review Code 1- Verbalizes Understanding       Falls Prevention: - Provides verbal and written material to individual with discussion of falls prevention and safety. Flowsheet Row Pulmonary Rehab from 10/16/2020 in [facility] Cardiac and Pulmonary Rehab  Date 07/24/20  Educator AS  Instruction Review  Code 1- Verbalizes Understanding       Chronic Lung Disease Review: - Group verbal instruction with posters, models, PowerPoint presentations and videos,  to review new updates, new respiratory medications, new advancements in procedures and treatments. Providing information on websites and "800" numbers for continued self-education. Includes information about supplement oxygen, available portable oxygen systems, continuous and intermittent flow rates, oxygen safety, concentrators, and Medicare reimbursement for oxygen. Explanation of Pulmonary Drugs, including class, frequency, complications, importance of spacers, rinsing mouth after steroid MDI's, and proper cleaning methods for nebulizers. Review of basic lung anatomy and physiology related to function, structure, and complications of lung disease. Review of risk factors. Discussion about methods for diagnosing sleep apnea and types of masks and machines for OSA. Includes a review of the use of types of environmental controls: home humidity, furnaces, filters, dust mite/pet prevention, HEPA vacuums. Discussion about weather changes, air quality and the benefits of nasal washing. Instruction on Warning signs, infection symptoms, calling MD promptly, preventive modes, and value of vaccinations. Review of effective airway clearance, coughing and/or vibration techniques. Emphasizing that all should Create an Action Plan. Written material given at graduation.   AED/CPR: - Group verbal and written instruction with the use of models to demonstrate the basic use of the AED with the basic ABC's of resuscitation.    Anatomy and Cardiac Procedures: - Group verbal and visual presentation and models provide information about basic cardiac anatomy and function. Reviews the testing methods done to diagnose heart disease and the outcomes of the test results. Describes the treatment choices: Medical Management, Angioplasty, or Coronary Bypass Surgery for treating  various heart conditions including Myocardial Infarction, Angina, Valve Disease, and Cardiac Arrhythmias.  Written material given at graduation. Flowsheet Row Pulmonary Rehab from 10/16/2020 in [facility] Cardiac and Pulmonary Rehab  Date 08/21/20  Educator SB  Instruction Review Code 1- Verbalizes Understanding       Medication Safety: - Group verbal and visual instruction to review commonly prescribed medications for heart and lung disease. Reviews the medication, class of the drug, and side effects. Includes the steps to properly store meds and maintain the prescription regimen.  Written material given at graduation. Flowsheet Row Pulmonary Rehab from 10/16/2020 in [facility] Cardiac and Pulmonary Rehab  Date 09/11/20  Educator SB  Instruction Review Code 1- Verbalizes Understanding       Other: -Provides group and verbal instruction on various topics (see comments)   Knowledge Questionnaire Score:  Knowledge Questionnaire Score - 11/01/20 0918       Knowledge Questionnaire Score   Post Score 17/18              Core Components/Risk Factors/Patient Goals at Admission:  Personal Goals and Risk Factors at Admission - 07/24/20 1630       Core Components/Risk Factors/Patient Goals on Admission    Weight Management Yes;Weight Loss    Intervention Weight Management: Develop a combined  nutrition and exercise program designed to reach desired caloric intake, while maintaining appropriate intake of nutrient and fiber, sodium and fats, and appropriate energy expenditure required for the weight goal.;Weight Management/Obesity: Establish reasonable short term and long term weight goals.    Admit Weight 258 lb (117 kg)    Goal Weight: Short Term 250 lb (113.4 kg)    Goal Weight: Long Term 240 lb (108.9 kg)    Expected Outcomes Short Term: Continue to assess and modify interventions until short term weight is achieved;Long Term: Adherence to nutrition and physical activity/exercise program aimed  toward attainment of established weight goal;Weight Maintenance: Understanding of the daily nutrition guidelines, which includes 25-35% calories from fat, 7% or less cal from saturated fats, less than $RemoveB'200mg'nkgLcTAT$  cholesterol, less than 1.5gm of sodium, & 5 or more servings of fruits and vegetables daily;Weight Loss: Understanding of general recommendations for a balanced deficit meal plan, which promotes 1-2 lb weight loss per week and includes a negative energy balance of [phone] kcal/d;Understanding recommendations for meals to include 15-35% energy as protein, 25-35% energy from fat, 35-60% energy from carbohydrates, less than $RemoveB'200mg'MzPYUMNc$  of dietary cholesterol, 20-35 gm of total fiber daily;Understanding of distribution of calorie intake throughout the day with the consumption of 4-5 meals/snacks    Heart Failure Yes    Intervention Provide a combined exercise and nutrition program that is supplemented with education, support and counseling about heart failure. Directed toward relieving symptoms such as shortness of breath, decreased exercise tolerance, and extremity edema.    Expected Outcomes Improve functional capacity of life;Short term: Attendance in program 2-3 days a week with increased exercise capacity. Reported lower sodium intake. Reported increased fruit and vegetable intake. Reports medication compliance.;Short term: Daily weights obtained and reported for increase. Utilizing diuretic protocols set by physician.;Long term: Adoption of self-care skills and reduction of barriers for early signs and symptoms recognition and intervention leading to self-care maintenance.    Hypertension Yes    Intervention Provide education on lifestyle modifcations including regular physical activity/exercise, weight management, moderate sodium restriction and increased consumption of fresh fruit, vegetables, and low fat dairy, alcohol moderation, and smoking cessation.;Monitor prescription use compliance.    Expected  Outcomes Short Term: Continued assessment and intervention until BP is < 140/32mm HG in hypertensive participants. < 130/76mm HG in hypertensive participants with diabetes, heart failure or chronic kidney disease.;Long Term: Maintenance of blood pressure at goal levels.             Education:Diabetes - Individual verbal and written instruction to review signs/symptoms of diabetes, desired ranges of glucose level fasting, after meals and with exercise. Acknowledge that pre and post exercise glucose checks will be done for 3 sessions at entry of program.   Know Your Numbers and Heart Failure: - Group verbal and visual instruction to discuss disease risk factors for cardiac and pulmonary disease and treatment options.  Reviews associated critical values for Overweight/Obesity, Hypertension, Cholesterol, and Diabetes.  Discusses basics of heart failure: signs/symptoms and treatments.  Introduces Heart Failure Zone chart for action plan for heart failure.  Written material given at graduation. Flowsheet Row Pulmonary Rehab from 10/16/2020 in [facility] Cardiac and Pulmonary Rehab  Date 09/18/20  Educator [facility]  Instruction Review Code 1- Verbalizes Understanding       Core Components/Risk Factors/Patient Goals Review:   Goals and Risk Factor Review     Row Name 08/30/20 0930 09/20/20 0935 10/21/20 0928         Core Components/Risk Factors/Patient Goals Review  Personal Goals Review Improve shortness of breath with ADL's Improve shortness of breath with ADL's;Weight Management/Obesity;Heart Failure Weight Management/Obesity;Heart Failure;Improve shortness of breath with ADL's     Review Spoke to patient about their shortness of breath and what they can do to improve. Patient has been informed of breathing techniques when starting the program. Patient is informed to tell staff if they have had any med changes and that certain meds they are taking or not taking can be causing shortness of breath. She  is able to make her bed and shower independently. She is making her own food and when she feels tired she will sit until she recovers. Eylin weighs herself everyday and denies any symptoms for HF. She is aware of what to look out for and to notify her doctor with any big changes. Her breathing at home has been OK and continues to use PLB when she ever feels SOB. She is trying to walk any where as much as she can. Jamila weighs daily and weight fluctuates some.  She saw her Dr last week and he was pleased.  He suggested she drink less water.  She will graudate in 6 more sessions.     Expected Outcomes Short: Attend [name] regularly to improve shortness of breath with ADL's. Long: maintain independence with ADL's Short: Continue watching weight at home and notify doctor on any major changes Long: Continue to manage lifestyle risk factors Short: continue to monitor weight and tale meds as directed Long: manage risk factors long term              Core Components/Risk Factors/Patient Goals at Discharge (Final Review):   Goals and Risk Factor Review - 10/21/20 0928       Core Components/Risk Factors/Patient Goals Review   Personal Goals Review Weight Management/Obesity;Heart Failure;Improve shortness of breath with ADL's    Review Latresha weighs daily and weight fluctuates some.  She saw her Dr last week and he was pleased.  He suggested she drink less water.  She will graudate in 6 more sessions.    Expected Outcomes Short: continue to monitor weight and tale meds as directed Long: manage risk factors long term             ITP Comments:  ITP Comments     Row Name 07/19/20 1010 07/24/20 1636 08/05/20 1145 08/14/20 0931 09/11/20 0623   ITP Comments Initial telephone orientations completed. Diagnosis can be found in [facility] 3/21. EP orientation scheduled for 3/30 at 1:30. Completed 6MWT and orientation.  Initial ITP sent to Dr Sabra Heck First full day of exercise!  Patient was oriented to gym and  equipment including functions, settings, policies, and procedures.  Patient's individual exercise prescription and treatment plan were reviewed.  All starting workloads were established based on the results of the 6 minute walk test done at initial orientation visit.  The plan for exercise progression was also introduced and progression will be customized based on patient's performance and goals. 30 Day review completed. Medical Director ITP review done, changes made as directed, and signed approval by Medical Director.   New to program 30 Day review completed. Medical Director ITP review done, changes made as directed, and signed approval by Medical Director.    Carlton Name 10/09/20 0651 11/06/20 0625         ITP Comments 30 Day review completed. Medical Director ITP review done, changes made as directed, and signed approval by Medical Director. 30 Day review completed. Medical Director ITP review  done, changes made as directed, and signed approval by Medical Director.               Comments: Discharge ITP

## 2020-11-06 NOTE — Progress Notes (Signed)
Discharge Progress Report  Patient Details  Name: Kimberly Mccarthy MRN: [id number] Date of Birth: 03/06/1956 Referring Provider:   Flowsheet Row Pulmonary Rehab from 07/24/2020 in [facility]  Referring Provider Gollan        Number of Visits: 63  Reason for Discharge:  Patient reached a stable level of exercise. Patient independent in their exercise.   Diagnosis:  Heart failure, diastolic, chronic (HCC)  ADL [facility]:  Pulmonary Assessment Scores     Row Name 07/24/20 1626 11/01/20 0916       ADL [facility]   ADL Phase -- Exit    SOB Score total 14 44    Rest 0 0    Walk 1 2    Stairs 2 3    Bath 0 3    Dress 0 2    Shop 0 3         CAT Score      CAT Score 6 13         mMRC Score      mMRC Score 2 --            Initial Exercise Prescription:  Initial Exercise Prescription - 07/24/20 1600       Date of Initial Exercise RX and Referring Provider   Date 07/24/20    Referring Provider Gollan      Treadmill   MPH 1    Grade 0    Minutes 15    METs 1      Recumbant Bike   Level 1    RPM 60    Minutes 15    METs 1      NuStep   Level 1    SPM 80    Minutes 15    METs 1      Arm Ergometer   Level 1    RPM 25    Minutes 15    METs 1      Recumbant Elliptical   Level 1    RPM 50    Minutes 15    METs 1      Biostep-RELP   Level 1    SPM 50    Minutes 15    METs 1      Prescription Details   Frequency (times per week) 3    Duration Progress to 30 minutes of continuous aerobic without signs/symptoms of physical distress      Intensity   THRR 40-80% of Max Heartrate 99-137    Ratings of Perceived Exertion 11-13    Perceived Dyspnea 0-4      Resistance Training   Training Prescription Yes    Weight 3 lb    Reps 10-15             Discharge Exercise Prescription (Final Exercise Prescription Changes):  Exercise Prescription Changes - 10/22/20 1500       Response to Exercise   Blood Pressure (Admit)  130/70    Blood Pressure (Exit) 110/74    Heart Rate (Admit) 71 bpm    Heart Rate (Exercise) 88 bpm    Heart Rate (Exit) 68 bpm    Oxygen Saturation (Admit) 92 %    Oxygen Saturation (Exercise) 90 %    Oxygen Saturation (Exit) 90 %    Rating of Perceived Exertion (Exercise) 12    Perceived Dyspnea (Exercise) 1    Symptoms none    Duration Continue with 30 min of aerobic exercise without signs/symptoms of physical distress.  Intensity THRR unchanged      Progression   Progression Continue to progress workloads to maintain intensity without signs/symptoms of physical distress.    Average METs 1.75      Resistance Training   Training Prescription Yes    Weight 3 lb    Reps 10-15      Interval Training   Interval Training No      NuStep   Level 4    Minutes 30      Home Exercise Plan   Plans to continue exercise at Home (comment)   walking   Frequency Add 2 additional days to program exercise sessions.    Initial Home Exercises Provided 09/04/20             Functional Capacity:  6 Minute Walk     Row Name 07/24/20 1615 10/30/20 1146       6 Minute Walk   Phase -- Discharge    Distance 415 feet 395 feet    Distance % Change -- -0.5 %    Distance Feet Change -- -20 ft    Walk Time 4.5 minutes 5.3 minutes    # of Rest Breaks 3 2    MPH 1.04 0.84    METS 0.97 0.69    RPE 15 19    Perceived Dyspnea  1 3    VO2 Peak 3.4 2.42    Symptoms Yes (comment) Yes (comment)    Comments legs hurt 10/10 legs hurt 10/10    Resting HR 61 bpm 66 bpm    Resting BP 152/78 132/78    Resting Oxygen Saturation  95 % 91 %    Exercise Oxygen Saturation  during 6 min walk 88 % 89 %    Max Ex. HR 91 bpm 84 bpm    Max Ex. BP 160/68 134/70    2 Minute Post BP 144/66 --         Interval HR      1 Minute HR 87 --    2 Minute HR 84 --    3 Minute HR 88 --    4 Minute HR 91 --    5 Minute HR 85 --    6 Minute HR 91 --    2 Minute Post HR 72 --    Interval Heart Rate? Yes --          Interval Oxygen      Interval Oxygen? Yes --    Baseline Oxygen Saturation % 95 % --    1 Minute Oxygen Saturation % 90 % --    1 Minute Liters of Oxygen 0 L --    2 Minute Oxygen Saturation % 90 % --    2 Minute Liters of Oxygen 0 L --    3 Minute Oxygen Saturation % 91 % --    3 Minute Liters of Oxygen 0 L --    4 Minute Oxygen Saturation % 91 % --    4 Minute Liters of Oxygen 0 L --    5 Minute Oxygen Saturation % 89 % --    5 Minute Liters of Oxygen 0 L --    6 Minute Oxygen Saturation % 88 % --    6 Minute Liters of Oxygen 0 L --    2 Minute Post Oxygen Saturation % 95 % --    2 Minute Post Liters of Oxygen 0 L --            Nutrition &  Weight - Outcomes:  Pre Biometrics - 07/24/20 1625       Pre Biometrics   Height 5\' 6"  (1.676 m)    Weight 258 lb (117 kg)    BMI (Calculated) 41.66             Post Biometrics - 10/30/20 1148        Post  Biometrics   Height 5\' 6"  (1.676 m)    Weight 258 lb 6.4 oz (117.2 kg)    BMI (Calculated) 41.73             Nutrition:  Nutrition Therapy & Goals - 09/02/20 1019       Nutrition Therapy   Diet Heart healthy, low Na    Drug/Food Interactions Statins/Certain Fruits    Protein (specify units) 94g    Fiber 25 grams    Whole Grain Foods 3 servings    Saturated Fats 12 max. grams    Fruits and Vegetables 8 servings/day    Sodium 1.5 grams      Personal Nutrition Goals   Nutrition Goal ST: swap out some sweets with recipes given (like banana "nice" cream) LT: limit sweets to 2-3x/week    Comments She is already reducing her sodium choosing no salt added items. She uses olive oil. She likes vegetables and fruit. She has no salt added canned foods and she cooks beans from dried. She feels that she has peanut butter and chocolate cookies and icecream - she has got back on track now. She feels her biggest problem is sweets. She feels like she knows what she needs to do. Discussed heart healthy eating.       Intervention Plan   Intervention Prescribe, educate and counsel regarding individualized specific dietary modifications aiming towards targeted core components such as weight, hypertension, lipid management, diabetes, heart failure and other comorbidities.;Nutrition handout(s) given to patient.    Expected Outcomes Short Term Goal: Understand basic principles of dietary content, such as calories, fat, sodium, cholesterol and nutrients.;Short Term Goal: A plan has been developed with personal nutrition goals set during dietitian appointment.;Long Term Goal: Adherence to prescribed nutrition plan.            Goals reviewed with patient; copy given to patient.

## 2020-11-06 NOTE — Progress Notes (Signed)
Pulmonary Individual Treatment Plan  Patient Details  Name: Kimberly Mccarthy MRN: [id number] Date of Birth: Aug 14, 1955 Referring Provider:   Flowsheet Row Pulmonary Rehab from 07/24/2020 in [facility] Cardiac and Pulmonary Rehab  Referring Provider Gollan       Initial Encounter Date:  Flowsheet Row Pulmonary Rehab from 07/24/2020 in [facility] Cardiac and Pulmonary Rehab  Date 07/24/20       Visit Diagnosis: Heart failure, diastolic, chronic (HCC)  Patient's Home Medications on Admission:  Current Outpatient Medications:    amLODipine (NORVASC) 10 MG tablet, TAKE 1 TABLET BY MOUTH DAILY, Disp: 30 tablet, Rfl: 0   ASPIRIN LOW DOSE 81 MG EC tablet, TAKE 1 TABLET BY MOUTH ONE TIME A DAY FOR CVA, Disp: 30 tablet, Rfl: 5   atorvastatin (LIPITOR) 80 MG tablet, TAKE 1 TABLET BY MOUTH DAILY, Disp: 30 tablet, Rfl: 5   carvedilol (COREG) 25 MG tablet, Take 1 tablet (25 mg total) by mouth 2 (two) times daily with a meal., Disp: 60 tablet, Rfl: 5   clopidogrel (PLAVIX) 75 MG tablet, Take 1 tablet (75 mg total) by mouth daily., Disp: 30 tablet, Rfl: 5   cyclobenzaprine (FLEXERIL) 10 MG tablet, Take 1 tablet by mouth at bedtime., Disp: , Rfl:    ezetimibe (ZETIA) 10 MG tablet, TAKE 1 TABLET BY MOUTH DAILY, Disp: 30 tablet, Rfl: 5   famotidine (PEPCID) 20 MG tablet, TAKE 1 TABLET BY MOUTH TWICE A DAY RELATED TO GASTROESOPHAGEAL REFLUX DISEASE, Disp: 60 tablet, Rfl: 6   FEROSUL 325 (65 Fe) MG tablet, TAKE 1 TABLET BY MOUTH ONE TIME A DAY EVERY MONDAY, WEDNESDAY, AND FRIDAY FOR IRON SUPPLEMENT, Disp: 12 tablet, Rfl: 6   furosemide (LASIX) 20 MG tablet, Take 1 tablet (20 mg total) by mouth daily as needed., Disp: 90 tablet, Rfl: 3   gabapentin (NEURONTIN) 300 MG capsule, TAKE 1 CAPSULE BY MOUTH 3 TIMES A DAY FOR PAIN. DO NOT CRUSH, Disp: 90 capsule, Rfl: 3   hydrALAZINE (APRESOLINE) 100 MG tablet, TAKE 1 TABLET BY MOUTH IN THE MORNING, AT NOON AND AT BEDTIME, Disp: 90 tablet, Rfl: 5   JARDIANCE 10 MG TABS  tablet, TAKE 1 TABLET BY MOUTH DAILY, Disp: 30 tablet, Rfl: 2   levothyroxine (SYNTHROID) 50 MCG tablet, Take 50 mcg by mouth daily before breakfast., Disp: , Rfl:    losartan (COZAAR) 50 MG tablet, TAKE 1 TABLET BY MOUTH DAILY, Disp: 90 tablet, Rfl: 1   Multiple Vitamin (MULTIVITAMIN ADULT PO), Take by mouth daily., Disp: , Rfl:    oxyCODONE (OXYCONTIN) 10 mg 12 hr tablet, Take 10 mg by mouth every 12 (twelve) hours. , Disp: , Rfl:    pantoprazole (PROTONIX) 40 MG tablet, Take 40 mg by mouth 2 (two) times daily., Disp: , Rfl:    polyethylene glycol (MIRALAX / GLYCOLAX) 17 g packet, Take 17 g by mouth daily., Disp: 14 each, Rfl: 0   senna (SENOKOT) 8.6 MG tablet, Take 1 tablet by mouth daily., Disp: , Rfl:   Past Medical History: Past Medical History:  Diagnosis Date   Anemia    AVM (arteriovenous malformation)    Bronchitis    Carotid artery disease (HCC) 10/31/2015   Chronic respiratory failure (HCC)    COPD (chronic obstructive pulmonary disease) (HCC)    Critical ischemia of lower extremity ([place]) 07/18/2014   H/O blood clots    Hyperkalemia    Hypertension    Hypothyroidism 09/05/2014   Morbid obesity ([place])    PVD (peripheral vascular disease) ([place])  Renal failure    Stroke [facility])    may 2015   Thyroid disease     Tobacco Use: Social History   Tobacco Use  Smoking Status Never  Smokeless Tobacco Never    Labs: Recent Review Flowsheet Data     Labs for ITP Cardiac and Pulmonary Rehab Latest Ref Rng & Units 02/14/2020 02/15/2020 02/15/2020 02/16/2020 07/15/2020   Cholestrol 100 - 199 mg/dL - - 226(H) - 152   LDLCALC 0 - 99 mg/dL - - 147(H) - 90   HDL >39 mg/dL - - 41 - 47   Trlycerides 0 - 149 mg/dL 119 190(H) 192(H) 121 77   Hemoglobin A1c 4.8 - 5.6 % - - - - -   PHART 7.350 - 7.450 - - - - -   PCO2ART 32.0 - 48.0 mmHg - - - - -   HCO3 20.0 - 28.0 mmol/L - - - - -   ACIDBASEDEF 0.0 - 2.0 mmol/L - - - - -   O2SAT % - - - - -        Pulmonary Assessment  Scores:  Pulmonary Assessment Scores     Row Name 07/24/20 1626 11/01/20 0916       ADL UCSD   ADL Phase -- Exit    SOB Score total 14 44    Rest 0 0    Walk 1 2    Stairs 2 3    Bath 0 3    Dress 0 2    Shop 0 3         CAT Score      CAT Score 6 13         mMRC Score      mMRC Score 2 --            UCSD: Self-administered rating of dyspnea associated with activities of daily living (ADLs) 6-point scale (0 = "not at all" to 5 = "maximal or unable to do because of breathlessness")  Scoring Scores range from 0 to 120.  Minimally important difference is 5 units  CAT: CAT can identify the health impairment of COPD patients and is better correlated with disease progression.  CAT has a scoring range of zero to 40. The CAT score is classified into four groups of low (less than 10), medium (10 - 20), high (21-30) and very high (31-40) based on the impact level of disease on health status. A CAT score over 10 suggests significant symptoms.  A worsening CAT score could be explained by an exacerbation, poor medication adherence, poor inhaler technique, or progression of COPD or comorbid conditions.  CAT MCID is 2 points  mMRC: mMRC (Modified Medical Research Council) Dyspnea Scale is used to assess the degree of baseline functional disability in patients of respiratory disease due to dyspnea. No minimal important difference is established. A decrease in score of 1 point or greater is considered a positive change.   Pulmonary Function Assessment:   Exercise Target Goals: Exercise Program Goal: Individual exercise prescription set using results from initial 6 min walk test and THRR while considering  patient's activity barriers and safety.   Exercise Prescription Goal: Initial exercise prescription builds to 30-45 minutes a day of aerobic activity, 2-3 days per week.  Home exercise guidelines will be given to patient during program as part of exercise prescription that the  participant will acknowledge.  Education: Aerobic Exercise: - Group verbal and visual presentation on the components of exercise prescription. Introduces F.I.T.T principle  from ACSM for exercise prescriptions.  Reviews F.I.T.T. principles of aerobic exercise including progression. Written material given at graduation. Flowsheet Row Pulmonary Rehab from 10/16/2020 in [facility] Cardiac and Pulmonary Rehab  Date 08/14/20  Educator AS  Instruction Review Code 1- Verbalizes Understanding       Education: Resistance Exercise: - Group verbal and visual presentation on the components of exercise prescription. Introduces F.I.T.T principle from ACSM for exercise prescriptions  Reviews F.I.T.T. principles of resistance exercise including progression. Written material given at graduation. Flowsheet Row Pulmonary Rehab from 10/16/2020 in [facility] Cardiac and Pulmonary Rehab  Date 08/21/20  Educator AS  Instruction Review Code 1- Verbalizes Understanding        Education: Exercise & Equipment Safety: - Individual verbal instruction and demonstration of equipment use and safety with use of the equipment. Flowsheet Row Pulmonary Rehab from 10/16/2020 in [facility] Cardiac and Pulmonary Rehab  Date 07/24/20  Educator AS  Instruction Review Code 1- Verbalizes Understanding       Education: Exercise Physiology & General Exercise Guidelines: - Group verbal and written instruction with models to review the exercise physiology of the cardiovascular system and associated critical values. Provides general exercise guidelines with specific guidelines to those with heart or lung disease.  Flowsheet Row Pulmonary Rehab from 10/16/2020 in [facility] Cardiac and Pulmonary Rehab  Date 10/09/20  Educator AS  Instruction Review Code 1- Verbalizes Understanding       Education: Flexibility, Balance, Mind/Body Relaxation: - Group verbal and visual presentation with interactive activity on the components of exercise prescription.  Introduces F.I.T.T principle from ACSM for exercise prescriptions. Reviews F.I.T.T. principles of flexibility and balance exercise training including progression. Also discusses the mind body connection.  Reviews various relaxation techniques to help reduce and manage stress (i.e. Deep breathing, progressive muscle relaxation, and visualization). Balance handout provided to take home. Written material given at graduation. Flowsheet Row Pulmonary Rehab from 10/16/2020 in [facility] Cardiac and Pulmonary Rehab  Date 08/28/20  Educator AS  Instruction Review Code 1- Verbalizes Understanding       Activity Barriers & Risk Stratification:  Activity Barriers & Cardiac Risk Stratification - 07/19/20 1003       Activity Barriers & Cardiac Risk Stratification   Activity Barriers Arthritis;Joint Problems;Muscular Weakness;Balance Concerns;Assistive Device   cane and walker            6 Minute Walk:  6 Minute Walk     Row Name 07/24/20 1615 10/30/20 1146       6 Minute Walk   Phase -- Discharge    Distance 415 feet 395 feet    Distance % Change -- -0.5 %    Distance Feet Change -- -20 ft    Walk Time 4.5 minutes 5.3 minutes    # of Rest Breaks 3 2    MPH 1.04 0.84    METS 0.97 0.69    RPE 15 19    Perceived Dyspnea  1 3    VO2 Peak 3.4 2.42    Symptoms Yes (comment) Yes (comment)    Comments legs hurt 10/10 legs hurt 10/10    Resting HR 61 bpm 66 bpm    Resting BP 152/78 132/78    Resting Oxygen Saturation  95 % 91 %    Exercise Oxygen Saturation  during 6 min walk 88 % 89 %    Max Ex. HR 91 bpm 84 bpm    Max Ex. BP 160/68 134/70    2 Minute Post BP 144/66 --  Interval HR      1 Minute HR 87 --    2 Minute HR 84 --    3 Minute HR 88 --    4 Minute HR 91 --    5 Minute HR 85 --    6 Minute HR 91 --    2 Minute Post HR 72 --    Interval Heart Rate? Yes --         Interval Oxygen      Interval Oxygen? Yes --    Baseline Oxygen Saturation % 95 % --    1 Minute Oxygen  Saturation % 90 % --    1 Minute Liters of Oxygen 0 L --    2 Minute Oxygen Saturation % 90 % --    2 Minute Liters of Oxygen 0 L --    3 Minute Oxygen Saturation % 91 % --    3 Minute Liters of Oxygen 0 L --    4 Minute Oxygen Saturation % 91 % --    4 Minute Liters of Oxygen 0 L --    5 Minute Oxygen Saturation % 89 % --    5 Minute Liters of Oxygen 0 L --    6 Minute Oxygen Saturation % 88 % --    6 Minute Liters of Oxygen 0 L --    2 Minute Post Oxygen Saturation % 95 % --    2 Minute Post Liters of Oxygen 0 L --           Oxygen Initial Assessment:  Oxygen Initial Assessment - 07/19/20 1004       Home Oxygen   Home Oxygen Device None    Sleep Oxygen Prescription None    Home Exercise Oxygen Prescription None    Home Resting Oxygen Prescription None      Initial 6 min Walk   Oxygen Used None      Program Oxygen Prescription   Program Oxygen Prescription None      Intervention   Short Term Goals To learn and understand importance of maintaining oxygen saturations>88%;To learn and understand importance of monitoring SPO2 with pulse oximeter and demonstrate accurate use of the pulse oximeter.;To learn and demonstrate proper pursed lip breathing techniques or other breathing techniques. ;To learn and demonstrate proper use of respiratory medications    Long  Term Goals Verbalizes importance of monitoring SPO2 with pulse oximeter and return demonstration;Maintenance of O2 saturations>88%;Exhibits proper breathing techniques, such as pursed lip breathing or other method taught during program session;Compliance with respiratory medication;Demonstrates proper use of MDI's             Oxygen Re-Evaluation:  Oxygen Re-Evaluation     Row Name 08/05/20 1147 08/30/20 0927 10/21/20 0943         Program Oxygen Prescription   Program Oxygen Prescription None None None           Home Oxygen       Home Oxygen Device None None None     Sleep Oxygen Prescription None None  None     Home Exercise Oxygen Prescription None None None     Home Resting Oxygen Prescription None None None     Compliance with Home Oxygen Use -- Yes Yes           Goals/Expected Outcomes       Short Term Goals To learn and understand importance of maintaining oxygen saturations>88%;To learn and understand importance of monitoring SPO2 with pulse oximeter and  demonstrate accurate use of the pulse oximeter.;To learn and demonstrate proper pursed lip breathing techniques or other breathing techniques. ;To learn and demonstrate proper use of respiratory medications To learn and understand importance of maintaining oxygen saturations>88% To learn and understand importance of maintaining oxygen saturations>88%     Long  Term Goals Verbalizes importance of monitoring SPO2 with pulse oximeter and return demonstration;Maintenance of O2 saturations>88%;Exhibits proper breathing techniques, such as pursed lip breathing or other method taught during program session;Compliance with respiratory medication;Demonstrates proper use of MDI's Maintenance of O2 saturations>88% Maintenance of O2 saturations>88%     Comments Reviewed PLB technique with pt.  Talked about how it works and it's importance in maintaining their exercise saturations. She has a pulse oximeter to check her oxygen saturation at home. Informed her  and explained why it is important to have one. Reviewed that oxygen saturations should be 88 percent and above. She states that her oxygen at home is in the mid 90s. Kaidan works on PLB at home and has no questions on how to do so. Joshua continues to monitor oxygen at home.  She has no other concerns regarding oxygen     Goals/Expected Outcomes Short: Become more profiecient at using PLB.   Long: Become independent at using PLB. Short: monitor oxygen at home with exertion. Long: maintain oxygen saturations above 88 percent independently. Short: continue to monitor oxygen at home Long: maintain oxygen  aboev 88%             Oxygen Discharge (Final Oxygen Re-Evaluation):  Oxygen Re-Evaluation - 10/21/20 0943       Program Oxygen Prescription   Program Oxygen Prescription None      Home Oxygen   Home Oxygen Device None    Sleep Oxygen Prescription None    Home Exercise Oxygen Prescription None    Home Resting Oxygen Prescription None    Compliance with Home Oxygen Use Yes      Goals/Expected Outcomes   Short Term Goals To learn and understand importance of maintaining oxygen saturations>88%    Long  Term Goals Maintenance of O2 saturations>88%    Comments Nalea continues to monitor oxygen at home.  She has no other concerns regarding oxygen    Goals/Expected Outcomes Short: continue to monitor oxygen at home Long: maintain oxygen aboev 88%             Initial Exercise Prescription:  Initial Exercise Prescription - 07/24/20 1600       Date of Initial Exercise RX and Referring Provider   Date 07/24/20    Referring Provider Gollan      Treadmill   MPH 1    Grade 0    Minutes 15    METs 1      Recumbant Bike   Level 1    RPM 60    Minutes 15    METs 1      NuStep   Level 1    SPM 80    Minutes 15    METs 1      Arm Ergometer   Level 1    RPM 25    Minutes 15    METs 1      Recumbant Elliptical   Level 1    RPM 50    Minutes 15    METs 1      Biostep-RELP   Level 1    SPM 50    Minutes 15    METs 1  Prescription Details   Frequency (times per week) 3    Duration Progress to 30 minutes of continuous aerobic without signs/symptoms of physical distress      Intensity   THRR 40-80% of Max Heartrate 99-137    Ratings of Perceived Exertion 11-13    Perceived Dyspnea 0-4      Resistance Training   Training Prescription Yes    Weight 3 lb    Reps 10-15             Perform Capillary Blood Glucose checks as needed.  Exercise Prescription Changes:   Exercise Prescription Changes     Row Name 07/24/20 1600 08/14/20 1700  08/28/20 0800 09/04/20 0900 09/11/20 1500     Response to Exercise   Blood Pressure (Admit) 152/78 142/80 146/74 -- 124/58   Blood Pressure (Exercise) 160/68 124/82 144/66 -- 118/78   Blood Pressure (Exit) 144/66 130/82 110/60 -- 126/72   Heart Rate (Admit) 61 bpm 79 bpm 72 bpm -- 66 bpm   Heart Rate (Exercise) 91 bpm 90 bpm 76 bpm -- 78 bpm   Heart Rate (Exit) 72 bpm 69 bpm 64 bpm -- 63 bpm   Oxygen Saturation (Admit) 95 % 93 % 98 % -- 94 %   Oxygen Saturation (Exercise) 88 % 88 % 90 % -- 92 %   Oxygen Saturation (Exit) 95 % 92 % 98 % -- 93 %   Rating of Perceived Exertion (Exercise) _0 -- 13   Perceived Dyspnea (Exercise) 1 -- 3 -- 0   Symptoms hips hurt second day -- -- none   Duration -- Progress to 30 minutes of  aerobic without signs/symptoms of physical distress Progress to 30 minutes of  aerobic without signs/symptoms of physical distress -- Continue with 30 min of aerobic exercise without signs/symptoms of physical distress.   Intensity -- THRR unchanged THRR unchanged -- THRR unchanged     Progression   Progression -- Continue to progress workloads to maintain intensity without signs/symptoms of physical distress. Continue to progress workloads to maintain intensity without signs/symptoms of physical distress. -- Continue to progress workloads to maintain intensity without signs/symptoms of physical distress.   Average METs -- 1.7 1.85 -- 1.85     Resistance Training   Training Prescription -- Yes Yes -- Yes   Weight -- 3 lb 3 lb -- 3 lb   Reps -- 10-15 10-15 -- 10-15     Interval Training   Interval Training -- -- -- -- No     Treadmill   MPH -- 1 -- -- --   Grade -- 0 -- -- --   Minutes -- 15 -- -- --   METs -- 1.77 -- -- --     NuStep   Level -- -- 1 -- 3   Minutes -- -- 15 -- 30   METs -- -- 1.7 -- 1.9     T5 Nustep   Level -- 1 -- -- --   Minutes -- 15 -- -- --     Biostep-RELP   Level -- -- 1 -- --   Minutes -- -- 15 -- --   METs -- -- 2 -- --      Home Exercise Plan   Plans to continue exercise at -- -- -- Home (comment)  walking Home (comment)  walking   Frequency -- -- -- Add 2 additional days to program exercise sessions. Add 2 additional days to program exercise sessions.   Initial Home Exercises  Provided -- -- -- 09/04/20 09/04/20    Row Name 09/26/20 1100 10/09/20 1100 10/22/20 1500         Response to Exercise   Blood Pressure (Admit) 132/60 118/62 130/70     Blood Pressure (Exercise) -- 126/62 --     Blood Pressure (Exit) 122/82 128/82 110/74     Heart Rate (Admit) 73 bpm 65 bpm 71 bpm     Heart Rate (Exercise) 76 bpm 74 bpm 88 bpm     Heart Rate (Exit) 70 bpm 60 bpm 68 bpm     Oxygen Saturation (Admit) 91 % 92 % 92 %     Oxygen Saturation (Exercise) 85 % 87 % 90 %     Oxygen Saturation (Exit) 89 % 90 % 90 %     Rating of Perceived Exertion (Exercise) _0 Perceived Dyspnea (Exercise) _1 Symptoms none none none     Duration Continue with 30 min of aerobic exercise without signs/symptoms of physical distress. Continue with 30 min of aerobic exercise without signs/symptoms of physical distress. Continue with 30 min of aerobic exercise without signs/symptoms of physical distress.     Intensity THRR unchanged THRR unchanged THRR unchanged           Progression       Progression Continue to progress workloads to maintain intensity without signs/symptoms of physical distress. Continue to progress workloads to maintain intensity without signs/symptoms of physical distress. Continue to progress workloads to maintain intensity without signs/symptoms of physical distress.     Average METs 1.4 1.89 1.75           Resistance Training       Training Prescription Yes Yes Yes     Weight 3 lb 3 lb 3 lb     Reps 10-15 10-15 10-15           Interval Training       Interval Training No No No           NuStep       Level _2 Minutes _3 METs 1.4 1.7 --           T5 Nustep       Level -- 1 --      Minutes -- 15 --     METs -- 2 --           Biostep-RELP       Level -- 1 --     Minutes -- 30 --     METs -- 2 --           Home Exercise Plan       Plans to continue exercise at Home (comment)  walking Home (comment)  walking Home (comment)  walking     Frequency Add 2 additional days to program exercise sessions. Add 2 additional days to program exercise sessions. Add 2 additional days to program exercise sessions.     Initial Home Exercises Provided 09/04/20 09/04/20 09/04/20             Exercise Comments:   Exercise Goals and Review:   Exercise Goals     Row Name 07/24/20 1625             Exercise Goals   Increase Physical Activity Yes       Intervention Provide advice, education, support and counseling about physical  activity/exercise needs.;Develop an individualized exercise prescription for aerobic and resistive training based on initial evaluation findings, risk stratification, comorbidities and participant's personal goals.       Expected Outcomes Short Term: Attend rehab on a regular basis to increase amount of physical activity.;Long Term: Add in home exercise to make exercise part of routine and to increase amount of physical activity.;Long Term: Exercising regularly at least 3-5 days a week.       Increase Strength and Stamina Yes       Intervention Provide advice, education, support and counseling about physical activity/exercise needs.;Develop an individualized exercise prescription for aerobic and resistive training based on initial evaluation findings, risk stratification, comorbidities and participant's personal goals.       Expected Outcomes Short Term: Increase workloads from initial exercise prescription for resistance, speed, and METs.;Short Term: Perform resistance training exercises routinely during rehab and add in resistance training at home;Long Term: Improve cardiorespiratory fitness, muscular endurance and strength as measured by increased  METs and functional capacity (6MWT)       Able to understand and use rate of perceived exertion (RPE) scale Yes       Intervention Provide education and explanation on how to use RPE scale       Expected Outcomes Short Term: Able to use RPE daily in rehab to express subjective intensity level;Long Term:  Able to use RPE to guide intensity level when exercising independently       Able to understand and use Dyspnea scale Yes       Intervention Provide education and explanation on how to use Dyspnea scale       Expected Outcomes Short Term: Able to use Dyspnea scale daily in rehab to express subjective sense of shortness of breath during exertion;Long Term: Able to use Dyspnea scale to guide intensity level when exercising independently       Knowledge and understanding of Target Heart Rate Range (THRR) Yes       Intervention Provide education and explanation of THRR including how the numbers were predicted and where they are located for reference       Expected Outcomes Short Term: Able to state/look up THRR;Short Term: Able to use daily as guideline for intensity in rehab;Long Term: Able to use THRR to govern intensity when exercising independently       Able to check pulse independently Yes       Intervention Provide education and demonstration on how to check pulse in carotid and radial arteries.;Review the importance of being able to check your own pulse for safety during independent exercise       Expected Outcomes Short Term: Able to explain why pulse checking is important during independent exercise;Long Term: Able to check pulse independently and accurately       Understanding of Exercise Prescription Yes       Intervention Provide education, explanation, and written materials on patient's individual exercise prescription       Expected Outcomes Short Term: Able to explain program exercise prescription;Long Term: Able to explain home exercise prescription to exercise independently                 Exercise Goals Re-Evaluation :  Exercise Goals Re-Evaluation     Row Name 08/05/20 1146 08/14/20 1742 08/28/20 0834 09/04/20 0930 09/11/20 1505     Exercise Goal Re-Evaluation   Exercise Goals Review Increase Physical Activity;Able to understand and use rate of perceived exertion (RPE) scale;Understanding of Exercise Prescription;Increase Strength and Stamina;Able to  check pulse independently Increase Physical Activity;Increase Strength and Stamina Increase Physical Activity;Increase Strength and Stamina Increase Physical Activity;Increase Strength and Stamina;Able to understand and use rate of perceived exertion (RPE) scale;Able to understand and use Dyspnea scale;Knowledge and understanding of Target Heart Rate Range (THRR);Able to check pulse independently;Understanding of Exercise Prescription Increase Physical Activity;Increase Strength and Stamina;Understanding of Exercise Prescription   Comments Reviewed RPE and dyspnea scales, THR and program prescription with pt today.  Pt voiced understanding and was given a copy of goals to take home. Aliz rated the TM a 20 for RPE.  She is just starting to exercise so staff wil encourage her to rest if needed and build up to 15 min. Brithany tried walking the track.  She still reported RPE of 17.  Staff will monitor progress. Reviewed home exercise with pt today.  Pt plans to walk at home for exercise.  Reviewed THR, pulse, RPE, sign and symptoms, pulse oximetery and when to call 911 or MD.  Also discussed weather considerations and indoor options.  Pt voiced understanding. Kevonna has been doing well in rehab.  She is up to 30 min on the NuStep.  She does not like the BioStep and not comfortable walking.  We will continue to monitor his progress.   Expected Outcomes Short: Use RPE daily to regulate intensity. Long: Follow program prescription in THR. Short: work towards 15 min on TM Long : build overall stamina Short:  continue to work on walking   Long:  be able to walk 15 minutes Short:  continue to work on walking  Long:  be able to walk 15 minutes Short: Continue move up workload Long: Continue to walk at home    [place] Name 09/20/20 0926 09/26/20 1128 10/09/20 1139 10/21/20 0933       Exercise Goal Re-Evaluation   Exercise Goals Review Increase Physical Activity;Increase Strength and Stamina;Understanding of Exercise Prescription;Knowledge and understanding of Target Heart Rate Range (THRR) Increase Physical Activity;Increase Strength and Stamina Increase Physical Activity;Increase Strength and Stamina Increase Physical Activity;Increase Strength and Stamina    Comments Paije is doing well. She tries to walk at the grocery store instead of riding in a chair which she is proud of. She tries to exercise at home on the off days from rehab. Encouraged to keep checking her HR and O2 during exercise. She is aware of  her THR and is encouraged to try to hit her goal. Meriel has attended consistently this month.  Staff work with Venetia Night on PLB and making sure oxygen stays above 88%.  We will continue to monitor progress. Sammie has been using the NuStep and Biostep for full sessions and is now up to level 2 on the NuStep. Will continue to monitor progress. Meylin would like to go to the [place] whe she completes LW.  She was given phone number to contatct them.  Also let her know they have a scholarship program.    Expected Outcomes Short: Continue to watch HR and O2 at home Long: Exercise at home with appropriate prescription Short: maintain consistent attendance Long: improve stamina Short: Increase loads when appropriate Long: Continue to increase overall MET level Short: contact [facility] to get set up to start Long:  maintain exercise on her own             Discharge Exercise Prescription (Final Exercise Prescription Changes):  Exercise Prescription Changes - 10/22/20 1500       Response to Exercise   Blood Pressure (Admit) 130/70  Blood  Pressure (Exit) 110/74    Heart Rate (Admit) 71 bpm    Heart Rate (Exercise) 88 bpm    Heart Rate (Exit) 68 bpm    Oxygen Saturation (Admit) 92 %    Oxygen Saturation (Exercise) 90 %    Oxygen Saturation (Exit) 90 %    Rating of Perceived Exertion (Exercise) 12    Perceived Dyspnea (Exercise) 1    Symptoms none    Duration Continue with 30 min of aerobic exercise without signs/symptoms of physical distress.    Intensity THRR unchanged      Progression   Progression Continue to progress workloads to maintain intensity without signs/symptoms of physical distress.    Average METs 1.75      Resistance Training   Training Prescription Yes    Weight 3 lb    Reps 10-15      Interval Training   Interval Training No      NuStep   Level 4    Minutes 30      Home Exercise Plan   Plans to continue exercise at Home (comment)   walking   Frequency Add 2 additional days to program exercise sessions.    Initial Home Exercises Provided 09/04/20             Nutrition:  Target Goals: Understanding of nutrition guidelines, daily intake of sodium '1500mg'$ , cholesterol '200mg'$ , calories 30% from fat and 7% or less from saturated fats, daily to have 5 or more servings of fruits and vegetables.  Education: All About Nutrition: -Group instruction provided by verbal, written material, interactive activities, discussions, models, and posters to present general guidelines for heart healthy nutrition including fat, fiber, MyPlate, the role of sodium in heart healthy nutrition, utilization of the nutrition label, and utilization of this knowledge for meal planning. Follow up email sent as well. Written material given at graduation. Flowsheet Row Pulmonary Rehab from 10/16/2020 in [facility] Card[facility] and Pulmonary Rehab  Date 09/04/20  Educator [facility]  Instruction Review Code 1- Verbalizes Understanding       Biometrics:  Pre Biometrics - 07/24/20 1625       Pre Biometrics   Height $Remov'5\' 6"'yNcYbQ$  (1.676 m)     Weight 258 lb (117 kg)    BMI (Calculated) 41.66             Post Biometrics - 10/30/20 1148        Post  Biometrics   Height $Remov'5\' 6"'cIfSAg$  (1.676 m)    Weight 258 lb 6.4 oz (117.2 kg)    BMI (Calculated) 41.73             Nutrition Therapy Plan and Nutrition Goals:  Nutrition Therapy & Goals - 09/02/20 1019       Nutrition Therapy   Diet Heart healthy, low Na    Drug/Food Interactions Statins/Certain Fruits    Protein (specify units) 94g    Fiber 25 grams    Whole Grain Foods 3 servings    Saturated Fats 12 max. grams    Fruits and Vegetables 8 servings/day    Sodium 1.5 grams      Personal Nutrition Goals   Nutrition Goal ST: swap out some sweets with recipes given (like banana "nice" cream) LT: limit sweets to 2-3x/week    Comments She is already reducing her sodium choosing no salt added items. She uses olive oil. She likes vegetables and fruit. She has no salt added canned foods and she cooks beans from dried. She feels  that she has peanut butter and chocolate cookies and icecream - she has got back on track now. She feels her biggest problem is sweets. She feels like she knows what she needs to do. Discussed heart healthy eating.      Intervention Plan   Intervention Prescribe, educate and counsel regarding individualized specific dietary modifications aiming towards targeted core components such as weight, hypertension, lipid management, diabetes, heart failure and other comorbidities.;Nutrition handout(s) given to patient.    Expected Outcomes Short Term Goal: Understand basic principles of dietary content, such as calories, fat, sodium, cholesterol and nutrients.;Short Term Goal: A plan has been developed with personal nutrition goals set during dietitian appointment.;Long Term Goal: Adherence to prescribed nutrition plan.             Nutrition Assessments:  MEDIFICTS Score Key: ?70 Need to make dietary changes  40-70 Heart Healthy Diet ? 40 Therapeutic Level  Cholesterol Diet  Flowsheet Row Pulmonary Rehab from 11/01/2020 in [facility] Cardiac and Pulmonary Rehab  Picture Your Plate Total Score on Discharge 58      Picture Your Plate Scores: <63 Unhealthy dietary pattern with much room for improvement. 41-50 Dietary pattern unlikely to meet recommendations for good health and room for improvement. 51-60 More healthful dietary pattern, with some room for improvement.  >60 Healthy dietary pattern, although there may be some specific behaviors that could be improved.   Nutrition Goals Re-Evaluation:  Nutrition Goals Re-Evaluation     [place] Name 08/30/20 0931 09/20/20 0933 10/21/20 0936         Goals   Current Weight 256 lb (116.1 kg) -- --     Nutrition Goal Meet with the dietician ST: swap out some sweets with recipes given (like banana "nice" cream) LT: limit sweets to 2-3x/week --     Comment Yulissa loves potato chips and wants to work on intaking less. She is meeting with the dietician on Monday. Amyah is doing well. She has cut out alcohol from her diet and her "sweets" is her to-go.  She is opting to use a more "light" icecream and was encouraged to check the nutrition label for sugar intake. She is trying Audrea states her worse problem is potato chips.  We discussed trying other options with lower sodium or buying items in smaller portions.     Expected Outcome Short: meet with the dietician. Long: maintain a diet that pertains to her needs. Short: Continue to reach goals established by RD Long: Continue to eat heart healthy diet Short:  try Skinny pop or other type chips Long: maintain heart healthy diet              Nutrition Goals Discharge (Final Nutrition Goals Re-Evaluation):  Nutrition Goals Re-Evaluation - 10/21/20 0936       Goals   Comment Ludia states her worse problem is potato chips.  We discussed trying other options with lower sodium or buying items in smaller portions.    Expected Outcome Short:  try Skinny pop or other  type chips Long: maintain heart healthy diet             Psychosocial: Target Goals: Acknowledge presence or absence of significant depression and/or stress, maximize coping skills, provide positive support system. Participant is able to verbalize types and ability to use techniques and skills needed for reducing stress and depression.   Education: Stress, Anxiety, and Depression - Group verbal and visual presentation to define topics covered.  Reviews how body is impacted by stress,  anxiety, and depression.  Also discusses healthy ways to reduce stress and to treat/manage anxiety and depression.  Written material given at graduation. Flowsheet Row Pulmonary Rehab from 10/16/2020 in [facility] Cardiac and Pulmonary Rehab  Date 10/02/20  Educator [facility]  Instruction Review Code 1- [employer] Understanding       Education: Sleep Hygiene -Provides group verbal and written instruction about how sleep can affect your health.  Define sleep hygiene, discuss sleep cycles and impact of sleep habits. Review good sleep hygiene tips.    Initial Review & Psychosocial Screening:  Initial Psych Review & Screening - 07/19/20 1006       Initial Review   Current issues with Current Stress Concerns    Source of Stress Concerns Unable to participate in former interests or hobbies;Unable to perform yard/household activities;Chronic Illness      Family Dynamics   Good Support System? Yes   sister; family     Barriers   Psychosocial barriers to participate in program There are no identifiable barriers or psychosocial needs.;The patient should benefit from training in stress management and relaxation.      Screening Interventions   Interventions Encouraged to exercise;To provide support and resources with identified psychosocial needs;Provide feedback about the scores to participant    Expected Outcomes Short Term goal: Utilizing psychosocial counselor, staff and physician to assist with identification of  specific Stressors or current issues interfering with healing process. Setting desired goal for each stressor or current issue identified.;Long Term Goal: Stressors or current issues are controlled or eliminated.;Short Term goal: Identification and review with participant of any Quality of Life or Depression concerns found by scoring the questionnaire.;Long Term goal: The participant improves quality of Life and PHQ9 Scores as seen by post scores and/or verbalization of changes             Quality of Life Scores:  Scores of 19 and below usually indicate a poorer quality of life in these areas.  A difference of  2-3 points is a clinically meaningful difference.  A difference of 2-3 points in the total score of the Quality of Life Index has been associated with significant improvement in overall quality of life, self-image, physical symptoms, and general health in studies assessing change in quality of life.  PHQ-9: Recent Review Flowsheet Data     Depression screen [facility] 2/9 11/01/2020 07/24/2020 08/30/2015 08/01/2015 07/02/2015   Decreased Interest 2 0 0 0 0   Down, Depressed, Hopeless 0 0 0 0 0   PHQ - 2 Score 2 0 0 0 0   Altered sleeping 0 0 - - -   Tired, decreased energy 0 0 - - -   Change in appetite 0 0 - - -   Feeling bad or failure about yourself  0 0 - - -   Trouble concentrating 0 0 - - -   Moving slowly or fidgety/restless 0 0 - - -   Suicidal thoughts 0 0 - - -   PHQ-9 Score 2 0 - - -   Difficult doing work/chores Somewhat difficult - - - -      Interpretation of Total Score  Total Score Depression Severity:  1-4 = Minimal depression, 5-9 = Mild depression, 10-14 = Moderate depression, 15-19 = Moderately severe depression, 20-27 = Severe depression   Psychosocial Evaluation and Intervention:  Psychosocial Evaluation - 07/19/20 1011       Psychosocial Evaluation & Interventions   Interventions Encouraged to exercise with the program and follow exercise  prescription     Comments Ms. Ringold is coming to Pulmonary Rehab with diastolic heart failure. She states her breathing is her main source of stress and she is tired of feeling short of breath. Her family is her main support system and lives close by to help with things. She did get referred in December but wanted to wait until it got warmer to start pulmonary rehab, in the meantime she had been working on her diet and has lost some weight which helps her feel better. She didn't report any other stressors and states she just takes it day by day.    Expected Outcomes Short: attend Pulmonary Rehab for education and exercise. Long: develop positive self care habits.    Continue Psychosocial Services  Follow up required by staff             Psychosocial Re-Evaluation:  Psychosocial Re-Evaluation     [place] Name 08/30/20 0933 09/20/20 0940 10/21/20 0939         Psychosocial Re-Evaluation   Current issues with Current Depression Current Depression Current Depression     Comments If Dioselina stays in the house to long she feels like she gets depressed. She goes to the store and back to the house mostly. Exercise is helping her get out of the house and helps with her stress. Saudia is doing well. She is managing her depression well. She states if she stays home for a long period of time she feels depressed. Not sure if she is on a medication but does not feel like she needs to chane anything. She is trying to go for walks to help give herself a break and feels it helps her. She has good support from her family. Sleep is good. Azriel states she fels pretty good.  She doesnt like to stay cooped up for long periods of time.  She wants to go to the [facility] to exercise so she can get out and meet people.     Expected Outcomes Short: Continue to exercise regularly to support mental health and notify staff of any changes. Long: maintain mental health and well being through teaching of rehab or prescribed medications independently.  Short: Continue attending rehab Long: Utilize exericse for stress management and maintain positive attitude Short: complete LW  Long: maintain exercise on her own     Interventions Encouraged to attend Pulmonary Rehabilitation for the exercise Encouraged to attend Pulmonary Rehabilitation for the exercise --     Continue Psychosocial Services  Follow up required by staff Follow up required by staff --              Psychosocial Discharge (Final Psychosocial Re-Evaluation):  Psychosocial Re-Evaluation - 10/21/20 0939       Psychosocial Re-Evaluation   Current issues with Current Depression    Comments Camaya states she fels pretty good.  She doesnt like to stay cooped up for long periods of time.  She wants to go to the [facility] to exercise so she can get out and meet people.    Expected Outcomes Short: complete LW  Long: maintain exercise on her own             Education: Education Goals: Education classes will be provided on a weekly basis, covering required topics. Participant will state understanding/return demonstration of topics presented.  Learning Barriers/Preferences:  Learning Barriers/Preferences - 07/19/20 1014       Learning Barriers/Preferences   Learning Barriers None    Learning Preferences None  General Pulmonary Education Topics:  Infection Prevention: - Provides verbal and written material to individual with discussion of infection control including proper hand washing and proper equipment cleaning during exercise session. Flowsheet Row Pulmonary Rehab from 10/16/2020 in [facility] Cardiac and Pulmonary Rehab  Date 07/24/20  Educator AS  Instruction Review Code 1- Verbalizes Understanding       Falls Prevention: - Provides verbal and written material to individual with discussion of falls prevention and safety. Flowsheet Row Pulmonary Rehab from 10/16/2020 in [facility] Cardiac and Pulmonary Rehab  Date 07/24/20  Educator AS  Instruction Review  Code 1- Verbalizes Understanding       Chronic Lung Disease Review: - Group verbal instruction with posters, models, PowerPoint presentations and videos,  to review new updates, new respiratory medications, new advancements in procedures and treatments. Providing information on websites and "800" numbers for continued self-education. Includes information about supplement oxygen, available portable oxygen systems, continuous and intermittent flow rates, oxygen safety, concentrators, and Medicare reimbursement for oxygen. Explanation of Pulmonary Drugs, including class, frequency, complications, importance of spacers, rinsing mouth after steroid MDI's, and proper cleaning methods for nebulizers. Review of basic lung anatomy and physiology related to function, structure, and complications of lung disease. Review of risk factors. Discussion about methods for diagnosing sleep apnea and types of masks and machines for OSA. Includes a review of the use of types of environmental controls: home humidity, furnaces, filters, dust mite/pet prevention, HEPA vacuums. Discussion about weather changes, air quality and the benefits of nasal washing. Instruction on Warning signs, infection symptoms, calling MD promptly, preventive modes, and value of vaccinations. Review of effective airway clearance, coughing and/or vibration techniques. Emphasizing that all should Create an Action Plan. Written material given at graduation.   AED/CPR: - Group verbal and written instruction with the use of models to demonstrate the basic use of the AED with the basic ABC's of resuscitation.    Anatomy and Cardiac Procedures: - Group verbal and visual presentation and models provide information about basic cardiac anatomy and function. Reviews the testing methods done to diagnose heart disease and the outcomes of the test results. Describes the treatment choices: Medical Management, Angioplasty, or Coronary Bypass Surgery for treating  various heart conditions including Myocardial Infarction, Angina, Valve Disease, and Cardiac Arrhythmias.  Written material given at graduation. Flowsheet Row Pulmonary Rehab from 10/16/2020 in [facility] Cardiac and Pulmonary Rehab  Date 08/21/20  Educator SB  Instruction Review Code 1- Verbalizes Understanding       Medication Safety: - Group verbal and visual instruction to review commonly prescribed medications for heart and lung disease. Reviews the medication, class of the drug, and side effects. Includes the steps to properly store meds and maintain the prescription regimen.  Written material given at graduation. Flowsheet Row Pulmonary Rehab from 10/16/2020 in [facility] Cardiac and Pulmonary Rehab  Date 09/11/20  Educator SB  Instruction Review Code 1- Verbalizes Understanding       Other: -Provides group and verbal instruction on various topics (see comments)   Knowledge Questionnaire Score:  Knowledge Questionnaire Score - 11/01/20 0918       Knowledge Questionnaire Score   Post Score 17/18              Core Components/Risk Factors/Patient Goals at Admission:  Personal Goals and Risk Factors at Admission - 07/24/20 1630       Core Components/Risk Factors/Patient Goals on Admission    Weight Management Yes;Weight Loss    Intervention Weight Management: Develop a combined  nutrition and exercise program designed to reach desired caloric intake, while maintaining appropriate intake of nutrient and fiber, sodium and fats, and appropriate energy expenditure required for the weight goal.;Weight Management/Obesity: Establish reasonable short term and long term weight goals.    Admit Weight 258 lb (117 kg)    Goal Weight: Short Term 250 lb (113.4 kg)    Goal Weight: Long Term 240 lb (108.9 kg)    Expected Outcomes Short Term: Continue to assess and modify interventions until short term weight is achieved;Long Term: Adherence to nutrition and physical activity/exercise program aimed  toward attainment of established weight goal;Weight Maintenance: Understanding of the daily nutrition guidelines, which includes 25-35% calories from fat, 7% or less cal from saturated fats, less than $RemoveB'200mg'cNvZmvjz$  cholesterol, less than 1.5gm of sodium, & 5 or more servings of fruits and vegetables daily;Weight Loss: Understanding of general recommendations for a balanced deficit meal plan, which promotes 1-2 lb weight loss per week and includes a negative energy balance of [id number] kcal/d;Understanding recommendations for meals to include 15-35% energy as protein, 25-35% energy from fat, 35-60% energy from carbohydrates, less than $RemoveB'200mg'StVPSgyE$  of dietary cholesterol, 20-35 gm of total fiber daily;Understanding of distribution of calorie intake throughout the day with the consumption of 4-5 meals/snacks    Heart Failure Yes    Intervention Provide a combined exercise and nutrition program that is supplemented with education, support and counseling about heart failure. Directed toward relieving symptoms such as shortness of breath, decreased exercise tolerance, and extremity edema.    Expected Outcomes Improve functional capacity of life;Short term: Attendance in program 2-3 days a week with increased exercise capacity. Reported lower sodium intake. Reported increased fruit and vegetable intake. Reports medication compliance.;Short term: Daily weights obtained and reported for increase. Utilizing diuretic protocols set by physician.;Long term: Adoption of self-care skills and reduction of barriers for early signs and symptoms recognition and intervention leading to self-care maintenance.    Hypertension Yes    Intervention Provide education on lifestyle modifcations including regular physical activity/exercise, weight management, moderate sodium restriction and increased consumption of fresh fruit, vegetables, and low fat dairy, alcohol moderation, and smoking cessation.;Monitor prescription use compliance.    Expected  Outcomes Short Term: Continued assessment and intervention until BP is < 140/54mm HG in hypertensive participants. < 130/106mm HG in hypertensive participants with diabetes, heart failure or chronic kidney disease.;Long Term: Maintenance of blood pressure at goal levels.             Education:Diabetes - Individual verbal and written instruction to review signs/symptoms of diabetes, desired ranges of glucose level fasting, after meals and with exercise. Acknowledge that pre and post exercise glucose checks will be done for 3 sessions at entry of program.   Know Your Numbers and Heart Failure: - Group verbal and visual instruction to discuss disease risk factors for cardiac and pulmonary disease and treatment options.  Reviews associated critical values for Overweight/Obesity, Hypertension, Cholesterol, and Diabetes.  Discusses basics of heart failure: signs/symptoms and treatments.  Introduces Heart Failure Zone chart for action plan for heart failure.  Written material given at graduation. Flowsheet Row Pulmonary Rehab from 10/16/2020 in [facility] Cardiac and Pulmonary Rehab  Date 09/18/20  Educator [facility]  Instruction Review Code 1- Verbalizes Understanding       Core Components/Risk Factors/Patient Goals Review:   Goals and Risk Factor Review     Row Name 08/30/20 0930 09/20/20 0935 10/21/20 0928         Core Components/Risk Factors/Patient Goals Review  Personal Goals Review Improve shortness of breath with ADL's Improve shortness of breath with ADL's;Weight Management/Obesity;Heart Failure Weight Management/Obesity;Heart Failure;Improve shortness of breath with ADL's     Review Spoke to patient about their shortness of breath and what they can do to improve. Patient has been informed of breathing techniques when starting the program. Patient is informed to tell staff if they have had any med changes and that certain meds they are taking or not taking can be causing shortness of breath. She  is able to make her bed and shower independently. She is making her own food and when she feels tired she will sit until she recovers. Eylin weighs herself everyday and denies any symptoms for HF. She is aware of what to look out for and to notify her doctor with any big changes. Her breathing at home has been OK and continues to use PLB when she ever feels SOB. She is trying to walk any where as much as she can. Jamila weighs daily and weight fluctuates some.  She saw her Dr last week and he was pleased.  He suggested she drink less water.  She will graudate in 6 more sessions.     Expected Outcomes Short: Attend [name] regularly to improve shortness of breath with ADL's. Long: maintain independence with ADL's Short: Continue watching weight at home and notify doctor on any major changes Long: Continue to manage lifestyle risk factors Short: continue to monitor weight and tale meds as directed Long: manage risk factors long term              Core Components/Risk Factors/Patient Goals at Discharge (Final Review):   Goals and Risk Factor Review - 10/21/20 0928       Core Components/Risk Factors/Patient Goals Review   Personal Goals Review Weight Management/Obesity;Heart Failure;Improve shortness of breath with ADL's    Review Latresha weighs daily and weight fluctuates some.  She saw her Dr last week and he was pleased.  He suggested she drink less water.  She will graudate in 6 more sessions.    Expected Outcomes Short: continue to monitor weight and tale meds as directed Long: manage risk factors long term             ITP Comments:  ITP Comments     Row Name 07/19/20 1010 07/24/20 1636 08/05/20 1145 08/14/20 0931 09/11/20 0623   ITP Comments Initial telephone orientations completed. Diagnosis can be found in [facility] 3/21. EP orientation scheduled for 3/30 at 1:30. Completed 6MWT and orientation.  Initial ITP sent to Dr Sabra Heck First full day of exercise!  Patient was oriented to gym and  equipment including functions, settings, policies, and procedures.  Patient's individual exercise prescription and treatment plan were reviewed.  All starting workloads were established based on the results of the 6 minute walk test done at initial orientation visit.  The plan for exercise progression was also introduced and progression will be customized based on patient's performance and goals. 30 Day review completed. Medical Director ITP review done, changes made as directed, and signed approval by Medical Director.   New to program 30 Day review completed. Medical Director ITP review done, changes made as directed, and signed approval by Medical Director.    Carlton Name 10/09/20 0651 11/06/20 0625         ITP Comments 30 Day review completed. Medical Director ITP review done, changes made as directed, and signed approval by Medical Director. 30 Day review completed. Medical Director ITP review  done, changes made as directed, and signed approval by Medical Director.               Comments:

## 2020-11-08 ENCOUNTER — Other Ambulatory Visit: Payer: Self-pay | Admitting: Family

## 2020-11-29 ENCOUNTER — Other Ambulatory Visit: Payer: Self-pay | Admitting: Family

## 2020-11-29 ENCOUNTER — Other Ambulatory Visit: Payer: Self-pay | Admitting: Cardiovascular Disease

## 2020-11-29 DIAGNOSIS — I5032 Chronic diastolic (congestive) heart failure: Secondary | ICD-10-CM

## 2020-12-11 ENCOUNTER — Encounter: Payer: Self-pay | Admitting: Cardiovascular Disease

## 2021-01-07 ENCOUNTER — Other Ambulatory Visit: Payer: Self-pay | Admitting: Family

## 2021-01-31 ENCOUNTER — Other Ambulatory Visit: Payer: Self-pay | Admitting: Family

## 2021-01-31 ENCOUNTER — Other Ambulatory Visit: Payer: Self-pay | Admitting: Cardiovascular Disease

## 2021-01-31 NOTE — Telephone Encounter (Signed)
Rx(s) sent to pharmacy electronically.  

## 2021-02-28 ENCOUNTER — Other Ambulatory Visit: Payer: Self-pay | Admitting: Cardiovascular Disease

## 2021-02-28 DIAGNOSIS — I5032 Chronic diastolic (congestive) heart failure: Secondary | ICD-10-CM

## 2021-03-28 ENCOUNTER — Other Ambulatory Visit: Payer: Self-pay | Admitting: Cardiovascular Disease

## 2021-03-28 NOTE — Telephone Encounter (Signed)
Please schedule 6 month F/U appointment. Thank you! 

## 2021-03-31 NOTE — Telephone Encounter (Signed)
Scheduled

## 2021-04-04 ENCOUNTER — Encounter (INDEPENDENT_AMBULATORY_CARE_PROVIDER_SITE_OTHER): Payer: Self-pay | Admitting: Vascular Surgery

## 2021-04-29 ENCOUNTER — Telehealth: Payer: Self-pay | Admitting: Cardiovascular Disease

## 2021-04-29 NOTE — Telephone Encounter (Signed)
Pt c/o medication issue:  1. Name of Medication: otc   2. How are you currently taking this medication (dosage and times per day)? Please advise   3. Are you having a reaction (difficulty breathing--STAT)? No  4. What is your medication issue? Patient has cold symptoms and weakness . Please advise what can or should be taken .

## 2021-04-29 NOTE — Telephone Encounter (Signed)
Was able to reach out to Kimberly Mccarthy to advised on OTC med for colds. Also recommend to always speak to pharmacist at drug store as they can also recommend safe medications to take OTC.   For safer over the counter (OTC) cold medications it is best to use Coricidin HBP (can also get the store generic brand as well). Coricidin HBP is design for people with high blood pressure and heart issues. Tylenol is safe as well, avoids medications with NSAIDS (Non-steroidal anti-inflammatory drugs) such as Ibuprofen and naproxen since you are on a blood thinner. For seasonal allergies, it is okay to take Zyrtec or Allegra.   Different OTC Coricidin HBP products  Chest Congestion and Cough Cold and Flu Cough and Cold Maximum Strength Multi-Symptom Flu Maximum Strength Cold, Cough and Flu Maximum Strength Cold, Cough & Flu Liquid Maximum Strength Nighttime Cold & Flu Liquid  Kimberly Mccarthy verbalized understanding and very thankful for the return call. Will call back with anything further.

## 2021-05-01 ENCOUNTER — Emergency Department
Admission: EM | Admit: 2021-05-01 | Discharge: 2021-05-01 | Disposition: A | Discharge status: Home / Self Care | Payer: Medicare Other | Attending: Emergency Medicine | Admitting: Emergency Medicine

## 2021-05-01 ENCOUNTER — Other Ambulatory Visit: Payer: Self-pay

## 2021-05-01 DIAGNOSIS — I509 Heart failure, unspecified: Secondary | ICD-10-CM | POA: Insufficient documentation

## 2021-05-01 DIAGNOSIS — N1832 Chronic kidney disease, stage 3b: Secondary | ICD-10-CM | POA: Diagnosis not present

## 2021-05-01 DIAGNOSIS — J441 Chronic obstructive pulmonary disease with (acute) exacerbation: Secondary | ICD-10-CM | POA: Insufficient documentation

## 2021-05-01 DIAGNOSIS — I130 Hypertensive heart and chronic kidney disease with heart failure and stage 1 through stage 4 chronic kidney disease, or unspecified chronic kidney disease: Secondary | ICD-10-CM | POA: Diagnosis not present

## 2021-05-01 DIAGNOSIS — B349 Viral infection, unspecified: Secondary | ICD-10-CM | POA: Insufficient documentation

## 2021-05-01 DIAGNOSIS — Z20822 Contact with and (suspected) exposure to covid-19: Secondary | ICD-10-CM | POA: Diagnosis not present

## 2021-05-01 DIAGNOSIS — E039 Hypothyroidism, unspecified: Secondary | ICD-10-CM | POA: Insufficient documentation

## 2021-05-01 DIAGNOSIS — I2510 Atherosclerotic heart disease of native coronary artery without angina pectoris: Secondary | ICD-10-CM | POA: Diagnosis not present

## 2021-05-01 DIAGNOSIS — R197 Diarrhea, unspecified: Secondary | ICD-10-CM

## 2021-05-01 DIAGNOSIS — R059 Cough, unspecified: Secondary | ICD-10-CM | POA: Diagnosis present

## 2021-05-01 LAB — COMPREHENSIVE METABOLIC PANEL
ALT: 12 U/L (ref 0–44)
AST: 16 U/L (ref 15–41)
Albumin: 3.8 g/dL (ref 3.5–5.0)
Alkaline Phosphatase: 75 U/L (ref 38–126)
Anion gap: 11 (ref 5–15)
BUN: 21 mg/dL (ref 8–23)
CO2: 26 mmol/L (ref 22–32)
Calcium: 9 mg/dL (ref 8.9–10.3)
Chloride: 106 mmol/L (ref 98–111)
Creatinine, Ser: 1.47 mg/dL — ABNORMAL HIGH (ref 0.44–1.00)
GFR, Estimated: 39 mL/min — ABNORMAL LOW (ref >60)
Glucose, Bld: 95 mg/dL (ref 70–99)
Potassium: 4.2 mmol/L (ref 3.5–5.1)
Sodium: 143 mmol/L (ref 135–145)
Total Bilirubin: 0.9 mg/dL (ref 0.3–1.2)
Total Protein: 7.8 g/dL (ref 6.5–8.1)

## 2021-05-01 LAB — CBC
HCT: 42 % (ref 36.0–46.0)
Hemoglobin: 12.8 g/dL (ref 12.0–15.0)
MCH: 26.2 pg (ref 26.0–34.0)
MCHC: 30.5 g/dL (ref 30.0–36.0)
MCV: 85.9 fL (ref 80.0–100.0)
Platelets: 269 10*3/uL (ref 150–400)
RBC: 4.89 MIL/uL (ref 3.87–5.11)
RDW: 18.3 % — ABNORMAL HIGH (ref 11.5–15.5)
WBC: 6 10*3/uL (ref 4.0–10.5)
nRBC: 0 % (ref 0.0–0.2)

## 2021-05-01 LAB — RESP PANEL BY RT-PCR (FLU A&B, COVID) ARPGX2
Influenza A by PCR: NEGATIVE
Influenza B by PCR: NEGATIVE
SARS Coronavirus 2 by RT PCR: POSITIVE — AB

## 2021-05-01 LAB — LIPASE, BLOOD: Lipase: 46 U/L (ref 11–51)

## 2021-05-01 MED ORDER — LOPERAMIDE HCL 2 MG PO TABS: 2.0000 mg | ORAL_TABLET | Freq: Four times a day (QID) | ORAL | 0 refills | Status: AC | PRN

## 2021-05-01 NOTE — ED Provider Notes (Signed)
[facility] Provider Note    Event Date/Time   First MD Initiated Contact with Patient 05/01/21 1053     (approximate)   History   Diarrhea   HPI  Kimberly Mccarthy is a 66 y.o. female with past medical history of COPD, hypothyroidism, CKD, CAD who presents with diarrhea and congestion.  Symptoms started on Sunday, 4 days ago.  She endorses cough congestion but denies shortness of breath or chest pain.  She called her cardiologist who prescribed her Coricidin which she has been taking.  Since starting that she has developed significant watery stool.  Denies blood in her stool or recent antibiotic use.  Denies abdominal pain nausea vomiting.  She stopped taking this medication.    Past Medical History:  Diagnosis Date   Anemia    AVM (arteriovenous malformation)    Bronchitis    Carotid artery disease (HCC) 10/31/2015   Chronic respiratory failure (HCC)    COPD (chronic obstructive pulmonary disease) (HCC)    Critical ischemia of lower extremity ([place]) 07/18/2014   H/O blood clots    Hyperkalemia    Hypertension    Hypothyroidism 09/05/2014   Morbid obesity ([place])    PVD (peripheral vascular disease) ([place])    Renal failure    Stroke [facility])    may 2015   Thyroid disease     Patient Active Problem List   Diagnosis Date Noted   Anemia 06/21/2020   Mixed hyperlipidemia 06/21/2020   (HFpEF) heart failure with preserved ejection fraction ([place]) 02/17/2020   Coronary artery disease due to calcified coronary lesion 02/17/2020   History of tobacco abuse 02/17/2020   Obesity 02/17/2020   OSA (obstructive sleep apnea) 02/17/2020   Non-ST elevation (NSTEMI) myocardial infarction [facility])    Elevated brain natriuretic peptide (BNP) level    Renal failure 02/12/2020   Chronic kidney disease, stage 3b ([place]) 12/01/2019   Acute on chronic respiratory failure ([place]) 12/01/2019   COPD with acute bronchitis ([place]) 12/01/2019   COPD exacerbation ([place]) 12/01/2019   Acute  respiratory failure with hypoxia and hypercapnia ([place]) 12/01/2019   Acute on chronic respiratory failure with hypoxia and hypercapnia (HCC)    Other chronic pain    Low back pain 09/09/2017   Stroke ([place]) 10/31/2015   Pain in both lower legs 08/30/2015   CRPS (complex regional pain syndrome) type I of lower limb 08/30/2015   Intermittent claudication ([place]) 01/03/2015   Moderate tricuspid insufficiency 01/03/2015   Chest pain 12/04/2014   Acute gastroenteritis 12/04/2014   Elevated liver enzymes    Abdominal pain, epigastric    HTN (hypertension), malignant 12/02/2014   Accelerated hypertension 09/05/2014   Essential hypertension 09/05/2014   Gastritis without bleeding 09/05/2014   Elevated troponin 09/05/2014   AKI (acute kidney injury) ([place]) 09/05/2014   Hypothyroidism 09/05/2014   Critical ischemia of lower extremity ([place]) 07/18/2014   Hyperkalemia 02/03/2014   Disorder of arteries and arterioles ([place]) 01/11/2014   Acid reflux 11/23/2013   Cerebrovascular accident, old 11/23/2013   Chronic kidney disease 08/02/2013     Physical Exam  Triage Vital Signs: ED Triage Vitals  Enc Vitals Group     BP 05/01/21 0843 (!) 146/97     Pulse Rate 05/01/21 0843 85     Resp 05/01/21 0843 18     Temp 05/01/21 0843 98.8 F (37.1 C)     Temp Source 05/01/21 0843 Oral     SpO2 05/01/21 0843 92 %  Weight 05/01/21 0844 250 lb (113.4 kg)     Height 05/01/21 0844 5\' 6"  (1.676 m)     Head Circumference --      Peak Flow --      Pain Score --      Pain Loc --      Pain Edu? --      Excl. in [place]? --     Most recent vital signs: Vitals:   05/01/21 0843  BP: (!) 146/97  Pulse: 85  Resp: 18  Temp: 98.8 F (37.1 C)  SpO2: 92%     General: Awake, no distress.  CV:  Good peripheral perfusion.  Resp:  Normal effort.  Lungs are clear, no increased work of breathing Abd:  No distention.  Abdomen is soft nontender throughout Neuro:             Awake, Alert, Oriented x 3   Other:     ED Results / Procedures / Treatments  Labs (all labs ordered are listed, but only abnormal results are displayed) Labs Reviewed  COMPREHENSIVE METABOLIC PANEL - Abnormal; Notable for the following components:      Result Value   Creatinine, Ser 1.47 (*)    GFR, Estimated 39 (*)    All other components within normal limits  CBC - Abnormal; Notable for the following components:   RDW 18.3 (*)    All other components within normal limits  RESP PANEL BY RT-PCR (FLU A&B, COVID) ARPGX2  LIPASE, BLOOD  URINALYSIS, ROUTINE W REFLEX MICROSCOPIC     EKG     RADIOLOGY    PROCEDURES:  Critical Care performed: No  Procedures   MEDICATIONS ORDERED IN ED: Medications - No data to display   IMPRESSION / MDM / [place] / ED COURSE  I reviewed the triage vital signs and the nursing notes.                              Differential diagnosis includes, but is not limited to, viral illness, colitis, gastroenteritis  66 year old female presenting with cough congestion and diarrhea x4 days.  Vital signs within normal limits and she appears well.  Lungs are clear she has no increased work of breathing.  Low suspicion for pneumonia.  Her main reason for coming in today is because of significant diarrhea.  She has no associated nausea vomiting diarrhea or fever.  This and her creatinine is near baseline.  I am not concerned for significant dehydration.  Patient seems to think that her diarrhea started after starting over-the-counter decongestant which she has stopped on her own.  Suspect viral illness.  COVID and influenza testing sent.  She has follow-up on Monday with her cardiologist.  Discussed return precautions.  We will also write her for Imodium which I think is safe given she is not having any abdominal pain fever or blood in her stool.  We discussed return precautions.  Do not feel that she needs admission given she is well-hydrated with normal vital signs.  She  is stable for discharge.                         FINAL CLINICAL IMPRESSION(S) / ED DIAGNOSES   Final diagnoses:  Viral syndrome  Diarrhea, unspecified type     Rx / DC Orders   ED Discharge Orders          Ordered  loperamide (IMODIUM A-D) 2 MG tablet  4 times daily PRN        05/01/21 1115             Note:  This document was prepared using Dragon voice recognition software and may include unintentional dictation errors.   Rada Hay, MD 05/01/21 1115

## 2021-05-01 NOTE — ED Triage Notes (Signed)
Pt comes into the ED via EMS from home with c/o diarrhea x2 days  150/88 HR75 97%RA

## 2021-05-01 NOTE — Discharge Instructions (Addendum)
I suspect that you have a viral illness.  You can try the Imodium to help with your diarrhea.  Please stop taking this if you develop abdominal pain, blood in your stool or fever.  Please follow-up with your cardiologist on Monday as scheduled.  We have sent test for COVID and flu, you can check your MyChart to see these results.

## 2021-05-02 ENCOUNTER — Other Ambulatory Visit: Payer: Self-pay | Admitting: Cardiovascular Disease

## 2021-05-04 NOTE — Progress Notes (Deleted)
No show

## 2021-05-05 ENCOUNTER — Ambulatory Visit: Payer: Medicare Other | Admitting: Cardiovascular Disease

## 2021-05-05 ENCOUNTER — Other Ambulatory Visit: Payer: Self-pay

## 2021-05-05 DIAGNOSIS — I25118 Atherosclerotic heart disease of native coronary artery with other forms of angina pectoris: Secondary | ICD-10-CM

## 2021-05-05 DIAGNOSIS — Z8673 Personal history of transient ischemic attack (TIA), and cerebral infarction without residual deficits: Secondary | ICD-10-CM

## 2021-05-05 DIAGNOSIS — I5032 Chronic diastolic (congestive) heart failure: Secondary | ICD-10-CM

## 2021-05-05 DIAGNOSIS — E785 Hyperlipidemia, unspecified: Secondary | ICD-10-CM

## 2021-05-05 DIAGNOSIS — I739 Peripheral vascular disease, unspecified: Secondary | ICD-10-CM

## 2021-05-05 DIAGNOSIS — G4733 Obstructive sleep apnea (adult) (pediatric): Secondary | ICD-10-CM

## 2021-05-05 DIAGNOSIS — I10 Essential (primary) hypertension: Secondary | ICD-10-CM

## 2021-05-05 DIAGNOSIS — I779 Disorder of arteries and arterioles, unspecified: Secondary | ICD-10-CM

## 2021-05-25 NOTE — Progress Notes (Signed)
Cardiology Office Note  Date:  05/26/2021   ID:  Kimberly Mccarthy, DOB 11/17/1955, MRN [id number]  PCP:  [facility]   Chief Complaint  Patient presents with   6 month follow up     Patient c/o shortness of breath and LE edema. Medications reviewed by the patient verbally.    HPI:  Kimberly Mccarthy is a 66 y.o. female with a hx of  CAD by cath 01/2020 , CABG HTN,  chronic pain,  hypothyroidism,  CKD, stage 3a PVD, followed at [facility] s/p RLE femoral endarterectomy with iliac artery thrombectomy 09/2014 and s/p LLE fem-PTA bypass with vein 12/2013 requiring angioplasty x2 for re-stenosis (last 04/2016). CVA,  COPD on QHS Bipap,  diastolic heart failure,  Morbid obesity who presents for follow-up of her coronary artery disease, cabg  LOV 6/22 In follow-up today discussed recent events, seen at [facility] for vascular disease Recent procedure discussed Had extremity arterial Doppler demonstrating stenosis Underwent angiogram lower extremities December 2022 Noted to have right CFA stenosis ,  drug coated balloon angioplasty  Patent LLE fem-PTA bypass.   Lab work March 2022 LDL 90 Reports that she is taking Lipitor 80 with Zetia  Weight up 270, Off lasix Left leg swelling seems to be getting worse but chronically left greater than right swelling Feels her weight is up 20 pounds, typically runs 250 pounds  Covid positive 3 weeks ago, 04/2021 Stayed at home  lives by herself able to take care of herself.  Her sister is nearby and helps with cleaning and grocery shopping.  EKG personally reviewed by myself on todays visit Normal sinus rhythm rate 65 bpm no significant ST-T wave changes  Of past medical history reviewed [facility]  October 2021 was discharged on IV Lasix, IV labetalol, obviously a mistake in her discharge medications   Prior records reviewed [facility] 02/12/2020 via EMS with acute on chronic hypoxic respiratory failure due to COPD and diastolic heart  failure  intubated on admission.    Echo at that time showed EF 50 to 55%, RV normal size and function, pericardial effusion anterior to the right ventricle, mild to moderate AS without stenosis.   Right and left heart cath showed severe two-vessel CAD with diffuse heavy calcification.  Sequential 90% proximal, 50% mid, 60% distal LAD stenosis as well as CTO at the ostium of codominant RCA.  Left circumflex with moderate disease that was diffuse.  She had severely elevated left heart and right heart pulmonary pressures.    transferred to [facility] for complex PCI versus CABG.   At [facility] her disease burden was determined to be appropriate for medical management and started on intensive medical therapy.    PMH:   has a past medical history of Anemia, AVM (arteriovenous malformation), Bronchitis, Carotid artery disease ([place]) (10/31/2015), Chronic respiratory failure (HCC), COPD (chronic obstructive pulmonary disease) ([place]), Critical ischemia of lower extremity ([place]) (07/18/2014), H/O blood clots, Hyperkalemia, Hypertension, Hypothyroidism (09/05/2014), Morbid obesity ([place]), PVD (peripheral vascular disease) ([place]), Renal failure, Stroke ([place]), and Thyroid disease.  PSH:    Past Surgical History:  Procedure Laterality Date   FEMORAL ENDARTERECTOMY Right    left foot surgery  02/2014   LEG SURGERY Left    RIGHT/LEFT HEART CATH AND CORONARY ANGIOGRAPHY N/A 02/16/2020   Procedure: RIGHT/LEFT HEART CATH AND CORONARY ANGIOGRAPHY;  Surgeon: Nelva Bush, MD;  Location: [facility];  Service: Cardiovascular;  Laterality: N/A;    Current Outpatient Medications  Medication Sig Dispense Refill  ASPIRIN LOW DOSE 81 MG EC tablet TAKE 1 TABLET BY MOUTH ONE TIME A DAY FOR CVA 30 tablet 5   cyclobenzaprine (FLEXERIL) 10 MG tablet Take 1 tablet by mouth at bedtime.     famotidine (PEPCID) 20 MG tablet TAKE 1 TABLET BY MOUTH TWICE A DAY RELATED TO GASTROESOPHAGEAL REFLUX DISEASE 180 tablet 1   FEROSUL 325 (65  Fe) MG tablet TAKE 1 TABLET BY MOUTH ONE TIME A DAY EVERY MONDAY, WEDNESDAY, AND FRIDAY FOR IRON SUPPLEMENT 12 tablet 6   gabapentin (NEURONTIN) 300 MG capsule TAKE 1 CAPSULE BY MOUTH 3 TIMES A DAY FOR PAIN. DO NOT CRUSH 90 capsule 3   levothyroxine (SYNTHROID) 50 MCG tablet Take 50 mcg by mouth daily before breakfast.     loperamide (IMODIUM A-D) 2 MG tablet Take 1 tablet (2 mg total) by mouth 4 (four) times daily as needed for diarrhea or loose stools. 30 tablet 0   Multiple Vitamin (MULTIVITAMIN ADULT PO) Take by mouth daily.     oxyCODONE (OXYCONTIN) 10 mg 12 hr tablet Take 10 mg by mouth every 12 (twelve) hours.      pantoprazole (PROTONIX) 40 MG tablet Take 40 mg by mouth 2 (two) times daily.     polyethylene glycol (MIRALAX / GLYCOLAX) 17 g packet Take 17 g by mouth daily. 14 each 0   senna (SENOKOT) 8.6 MG tablet Take 1 tablet by mouth daily.     Vitamin D, Ergocalciferol, (DRISDOL) 1.25 MG (50000 UNIT) CAPS capsule Take 50,000 Units by mouth every 7 (seven) days.     amLODipine (NORVASC) 10 MG tablet Take 1 tablet (10 mg total) by mouth daily. 90 tablet 3   atorvastatin (LIPITOR) 80 MG tablet Take 1 tablet (80 mg total) by mouth daily. 90 tablet 3   carvedilol (COREG) 25 MG tablet Take 1 tablet (25 mg total) by mouth 2 (two) times daily with a meal. 180 tablet 3   clopidogrel (PLAVIX) 75 MG tablet Take 1 tablet (75 mg total) by mouth daily. 90 tablet 3   empagliflozin (JARDIANCE) 10 MG TABS tablet Take 1 tablet (10 mg total) by mouth daily. 90 tablet 3   ezetimibe (ZETIA) 10 MG tablet Take 1 tablet (10 mg total) by mouth daily. 90 tablet 3   furosemide (LASIX) 20 MG tablet Take 20 mg Monday, Wednesday and Friday and as needed for swelling 90 tablet 3   hydrALAZINE (APRESOLINE) 100 MG tablet TAKE 1 TABLET BY MOUTH IN THE MORNING, AT NOON AND AT BEDTIME 90 tablet 3   losartan (COZAAR) 50 MG tablet Take 1 tablet (50 mg total) by mouth daily. 90 tablet 3   No current facility-administered  medications for this visit.     Allergies:   Patient has no known allergies.   Social History:  The patient  reports that she has never smoked. She has never used smokeless tobacco. She reports that she does not drink alcohol and does not use drugs.   Family History:   family history includes Diabetes in her mother; Hypertension in her mother and sister; Thyroid disease in her sister.    Review of Systems: Review of Systems  Constitutional: Negative.   HENT: Negative.    Respiratory: Negative.    Cardiovascular: Negative.   Gastrointestinal: Negative.   Musculoskeletal:  Positive for joint pain.  Neurological: Negative.   Psychiatric/Behavioral: Negative.    All other systems reviewed and are negative.   PHYSICAL EXAM: VS:  BP (!) 130/58 (BP Location: Left  Arm, Patient Position: Sitting, Cuff Size: Normal)    Pulse 65    Ht 5\' 6"  (1.676 m)    Wt 270 lb 4 oz (122.6 kg)    SpO2 99%    BMI 43.62 kg/m  , BMI Body mass index is 43.62 kg/m. GEN: Well nourished, well developed, in no acute distress HEENT: normal Neck: no JVD, carotid bruits, or masses Cardiac: RRR; no murmurs, rubs, or gallops,no edema  Respiratory:  clear to auscultation bilaterally, normal work of breathing GI: soft, nontender, nondistended, + BS MS: no deformity or atrophy Skin: warm and dry, no rash Neuro:  Strength and sensation are intact Psych: euthymic mood, full affect   Recent Labs: 05/01/2021: ALT 12; BUN 21; Creatinine, Ser 1.47; Hemoglobin 12.8; Platelets 269; Potassium 4.2; Sodium 143    Lipid Panel Lab Results  Component Value Date   CHOL 152 07/15/2020   HDL 47 07/15/2020   LDLCALC 90 07/15/2020   TRIG 77 07/15/2020      Wt Readings from Last 3 Encounters:  05/26/21 270 lb 4 oz (122.6 kg)  05/01/21 250 lb (113.4 kg)  10/30/20 258 lb 6.4 oz (117.2 kg)       ASSESSMENT AND PLAN:  Problem List Items Addressed This Visit       Cardiology Problems   Essential hypertension    Relevant Medications   furosemide (LASIX) 20 MG tablet   amLODipine (NORVASC) 10 MG tablet   atorvastatin (LIPITOR) 80 MG tablet   carvedilol (COREG) 25 MG tablet   ezetimibe (ZETIA) 10 MG tablet   hydrALAZINE (APRESOLINE) 100 MG tablet   losartan (COZAAR) 50 MG tablet     Other   OSA (obstructive sleep apnea)   Other Visit Diagnoses     Coronary artery disease of native artery of native heart with stable angina pectoris (HCC)    -  Primary   Relevant Medications   furosemide (LASIX) 20 MG tablet   amLODipine (NORVASC) 10 MG tablet   atorvastatin (LIPITOR) 80 MG tablet   carvedilol (COREG) 25 MG tablet   ezetimibe (ZETIA) 10 MG tablet   hydrALAZINE (APRESOLINE) 100 MG tablet   losartan (COZAAR) 50 MG tablet   Other Relevant Orders   EKG 12-Lead   Chronic diastolic heart failure (HCC)       Relevant Medications   furosemide (LASIX) 20 MG tablet   amLODipine (NORVASC) 10 MG tablet   atorvastatin (LIPITOR) 80 MG tablet   carvedilol (COREG) 25 MG tablet   ezetimibe (ZETIA) 10 MG tablet   hydrALAZINE (APRESOLINE) 100 MG tablet   empagliflozin (JARDIANCE) 10 MG TABS tablet   losartan (COZAAR) 50 MG tablet   Other Relevant Orders   EKG 12-Lead   PAD (peripheral artery disease) (HCC)       Relevant Medications   furosemide (LASIX) 20 MG tablet   amLODipine (NORVASC) 10 MG tablet   atorvastatin (LIPITOR) 80 MG tablet   carvedilol (COREG) 25 MG tablet   ezetimibe (ZETIA) 10 MG tablet   hydrALAZINE (APRESOLINE) 100 MG tablet   losartan (COZAAR) 50 MG tablet   Other Relevant Orders   EKG 12-Lead   Hyperlipidemia LDL goal <70       Relevant Medications   furosemide (LASIX) 20 MG tablet   amLODipine (NORVASC) 10 MG tablet   atorvastatin (LIPITOR) 80 MG tablet   carvedilol (COREG) 25 MG tablet   ezetimibe (ZETIA) 10 MG tablet   hydrALAZINE (APRESOLINE) 100 MG tablet   losartan (COZAAR) 50 MG  tablet   Carotid artery disease, unspecified laterality, unspecified type (HCC)        Relevant Medications   furosemide (LASIX) 20 MG tablet   amLODipine (NORVASC) 10 MG tablet   atorvastatin (LIPITOR) 80 MG tablet   carvedilol (COREG) 25 MG tablet   ezetimibe (ZETIA) 10 MG tablet   hydrALAZINE (APRESOLINE) 100 MG tablet   losartan (COZAAR) 50 MG tablet     Diastolic CHF Off her Lasix, weight up 20 pounds, left leg more swollen Drink lots of fluid when she had COVID 3 weeks ago Recommend she take Lasix 20 at least every other day and as needed for worsening swelling Was told she was dehydrated by nephrology in December 2022 when she was taking Lasix daily  Morbid obesity Weight up 20 pounds Unable to exercise, low calorie diet recommended  Coronary artery disease with stable angina Lifestyle modification recommended, weight loss Goal LDL less than 70 Most recent LDL 90 but this was 1 year ago, scheduled for lab work at [place] [facility] March 2023 Recommend she check to make sure she is on atorvastatin 80 with Zetia 10 If numbers continue to run high would need Repatha/Praluent  COPD Weight loss recommended Reports symptoms stable    Total encounter time more than 30 minutes  Greater than 50% was spent in counseling and coordination of care with the patient    Signed, Esmond Plants, M.D., Ph.D. [place], [place]

## 2021-05-26 ENCOUNTER — Encounter: Payer: Self-pay | Admitting: Cardiovascular Disease

## 2021-05-26 ENCOUNTER — Ambulatory Visit (INDEPENDENT_AMBULATORY_CARE_PROVIDER_SITE_OTHER): Payer: Medicare Other | Admitting: Cardiovascular Disease

## 2021-05-26 ENCOUNTER — Other Ambulatory Visit: Payer: Self-pay

## 2021-05-26 VITALS — BP 130/58 | HR 65 | Ht 66.0 in | Wt 270.2 lb

## 2021-05-26 DIAGNOSIS — G4733 Obstructive sleep apnea (adult) (pediatric): Secondary | ICD-10-CM

## 2021-05-26 DIAGNOSIS — I10 Essential (primary) hypertension: Secondary | ICD-10-CM | POA: Diagnosis not present

## 2021-05-26 DIAGNOSIS — E785 Hyperlipidemia, unspecified: Secondary | ICD-10-CM

## 2021-05-26 DIAGNOSIS — I739 Peripheral vascular disease, unspecified: Secondary | ICD-10-CM | POA: Diagnosis not present

## 2021-05-26 DIAGNOSIS — I25118 Atherosclerotic heart disease of native coronary artery with other forms of angina pectoris: Secondary | ICD-10-CM

## 2021-05-26 DIAGNOSIS — I779 Disorder of arteries and arterioles, unspecified: Secondary | ICD-10-CM

## 2021-05-26 DIAGNOSIS — I5032 Chronic diastolic (congestive) heart failure: Secondary | ICD-10-CM | POA: Diagnosis not present

## 2021-05-26 MED ORDER — LOSARTAN POTASSIUM 50 MG PO TABS: 50.0000 mg | ORAL_TABLET | Freq: Every day | ORAL | 3 refills | Status: DC

## 2021-05-26 MED ORDER — HYDRALAZINE HCL 100 MG PO TABS: ORAL_TABLET | ORAL | 3 refills | Status: DC

## 2021-05-26 MED ORDER — FUROSEMIDE 20 MG PO TABS: ORAL_TABLET | ORAL | 3 refills | Status: DC

## 2021-05-26 MED ORDER — ATORVASTATIN CALCIUM 80 MG PO TABS: 80.0000 mg | ORAL_TABLET | Freq: Every day | ORAL | 3 refills | Status: DC

## 2021-05-26 MED ORDER — CLOPIDOGREL BISULFATE 75 MG PO TABS: 75.0000 mg | ORAL_TABLET | Freq: Every day | ORAL | 3 refills | Status: DC

## 2021-05-26 MED ORDER — EZETIMIBE 10 MG PO TABS: 10.0000 mg | ORAL_TABLET | Freq: Every day | ORAL | 3 refills | Status: DC

## 2021-05-26 MED ORDER — AMLODIPINE BESYLATE 10 MG PO TABS: 10.0000 mg | ORAL_TABLET | Freq: Every day | ORAL | 3 refills | Status: DC

## 2021-05-26 MED ORDER — CARVEDILOL 25 MG PO TABS: 25.0000 mg | ORAL_TABLET | Freq: Two times a day (BID) | ORAL | 3 refills | Status: DC

## 2021-05-26 MED ORDER — EMPAGLIFLOZIN 10 MG PO TABS: 10.0000 mg | ORAL_TABLET | Freq: Every day | ORAL | 3 refills | Status: DC

## 2021-05-26 NOTE — Patient Instructions (Addendum)
Vein and vascular in [place] if needed Dr. Wyn Quaker and Dr. Hebert [address]  Medication Instructions:  Lasix 20 mg  Take on Monday, Wednesday and Friday and as needed for swelling  Look in the bubble pack, Look for ezetimibe (zetia)and atorvastatin (lipitor)  Both for cholesterol  If you need a refill on your cardiac medications before your next appointment, please call your pharmacy.   Lab work: No new labs needed  Testing/Procedures: No new testing needed  Follow-Up: At [facility], you and your health needs are our priority.  As part of our continuing mission to provide you with exceptional heart care, we have created designated Provider Care Teams.  These Care Teams include your primary Cardiologist (physician) and Advanced Practice Providers (APPs -  Physician Assistants and Nurse Practitioners) who all work together to provide you with the care you need, when you need it.  You will need a follow up appointment in 6 months, app ok  Providers on your designated Care Team:   Nicolasa Ducking, NP Eula Listen, PA-C Cadence Fransico Michael, [place]  COVID-19 Vaccine Information can be found at: [email or web address] For questions related to vaccine distribution or appointments, please email [email or web address] or call [phone].

## 2021-07-25 ENCOUNTER — Other Ambulatory Visit: Payer: Self-pay | Admitting: Cardiovascular Disease

## 2021-07-25 NOTE — Telephone Encounter (Signed)
Please advise if ok to refill these two medications.  ?Last filled by [facility]. ?

## 2021-09-07 NOTE — Progress Notes (Signed)
MRN : [id number]  Kimberly Mccarthy is a 66 y.o. (05-Jan-1956) female who presents with chief complaint of check circulation.  History of Present Illness:    The patient is seen for evaluation of painful lower extremities and diminished pulses.   LEFT LEG: LLE fem-PTA bypass with vein in 2015 with DCB angioplasty of the proximal anastomosis 2018. Regarding the LLE, patient has a history of abnormal duplex findings suggestive of stenosis of proximal anastomis, but she has multiple angiograms (most recent 02/2021) showing this is just a size mismatch. No intervention indicated but merits close follow up. Flow through graft today 33/10 cm/s.  RIGHT LEG[facility] ABI increased from 0.3 to 0.5 following intervention in Fall 2022; imaging today shows some continued CFA turbulence, but given patient's toe pressure is 54 mmHg and asymptomatic status, there is no further intervention indicated at this time.   Patient notes the pain is always associated with activity and is very consistent day today. Typically, the pain occurs at short distances, progress is as activity continues to the point that the patient must stop walking. Resting including standing still for several minutes allows the patient to walk a similar distance before being forced to stop again. The patient denies any recent abrupt changes in claudication symptoms.  The patient states the inability to walk is causing problems with daily activities.  The patient denies rest pain or dangling of an extremity off the side of the bed during the night for relief. No open wounds or sores at this time. No prior interventions or surgeries.  Interventions to date:  - 12/21/13: Left CFA and profunda endarterectomy with bovine patch angioplasty for CLI (tissue loss)  - 01/17/14: Left fem-PTA bypass with reversed GSV, CFA patch angioplasty/profundaplasty, balloon thrombectomy of the CFA, EIA, CIA, PFA for CLI (rest pain)   - 10/16/14: Right femoral endarterectomy and patch angioplasty, R iliac artery fogarty thrombectomy, left CIA and CFA bypass angioplasties for RLE CLI  - 07/25/15: Balloon angioplasty of proximal anastomosis stenosis, suction thrombectomy  - 05/07/16: DCB angioplasty of LLE bypass proximal anastomosis - 03/06/21: BLE diagnostic arteriogram finding patent LLE bypass, DCB angioplasty of RCFA   No history of back problems or DJD of the lumbar sacral spine.   The patient's blood pressure has been stable and relatively well controlled. The patient denies amaurosis fugax or recent TIA symptoms. There are no recent neurological changes noted. The patient denies history of DVT, PE or superficial thrombophlebitis. The patient denies recent episodes of angina or shortness of breath.    No outpatient medications have been marked as taking for the 09/08/21 encounter (Appointment) with Gilda CreaseSchnier, Latina CraverGregory G, MD.    Past Medical History:  Diagnosis Date   Anemia    AVM (arteriovenous malformation)    Bronchitis    Carotid artery disease (HCC) 10/31/2015   Chronic respiratory failure (HCC)    COPD (chronic obstructive pulmonary disease) (HCC)    Critical ischemia of lower extremity (HCC) 07/18/2014   H/O blood clots    Hyperkalemia    Hypertension    Hypothyroidism 09/05/2014   Morbid obesity (HCC)    PVD (peripheral vascular disease) (HCC)    Renal failure    Stroke [facility](HCC)    may 2015   Thyroid disease     Past Surgical History:  Procedure Laterality Date   FEMORAL ENDARTERECTOMY Right    left foot surgery  02/2014   LEG SURGERY Left    RIGHT/LEFT  HEART CATH AND CORONARY ANGIOGRAPHY N/A 02/16/2020   Procedure: RIGHT/LEFT HEART CATH AND CORONARY ANGIOGRAPHY;  Surgeon: Yvonne Kendall, MD;  Location: [facility] INVASIVE CV LAB;  Service: Cardiovascular;  Laterality: N/A;    Social History Social History   Tobacco Use   Smoking status: Never   Smokeless tobacco: Never  Vaping Use   Vaping Use:  Never used  Substance Use Topics   Alcohol use: No    Alcohol/week: 0.0 standard drinks   Drug use: No    Family History Family History  Problem Relation Age of Onset   Diabetes Mother    Hypertension Mother    Hypertension Sister    Thyroid disease Sister    Breast cancer Neg Hx     No Known Allergies   REVIEW OF SYSTEMS (Negative unless checked)  Constitutional: [] Weight loss  [] Fever  [] Chills Cardiac: [] Chest pain   [] Chest pressure   [] Palpitations   [] Shortness of breath when laying flat   [] Shortness of breath with exertion. Vascular:  [x] Pain in legs with walking   [] Pain in legs at rest  [] History of DVT   [] Phlebitis   [] Swelling in legs   [] Varicose veins   [] Non-healing ulcers Pulmonary:   [] Uses home oxygen   [] Productive cough   [] Hemoptysis   [] Wheeze  [x] COPD   [] Asthma Neurologic:  [] Dizziness   [] Seizures   [] History of stroke   [] History of TIA  [] Aphasia   [] Vissual changes   [] Weakness or numbness in arm   [] Weakness or numbness in leg Musculoskeletal:   [] Joint swelling   [] Joint pain   [] Low back pain Hematologic:  [] Easy bruising  [] Easy bleeding   [] Hypercoagulable state   [] Anemic Gastrointestinal:  [] Diarrhea   [] Vomiting  [x] Gastroesophageal reflux/heartburn   [] Difficulty swallowing. Genitourinary:  [] Chronic kidney disease   [] Difficult urination  [] Frequent urination   [] Blood in urine Skin:  [] Rashes   [] Ulcers  Psychological:  [] History of anxiety   []  History of major depression.  Physical Examination  There were no vitals filed for this visit. There is no height or weight on file to calculate BMI. Gen: WD/WN, NAD Head: [place]/AT, No temporalis wasting.  Ear/Nose/Throat: Hearing grossly intact, nares w/o erythema or drainage Eyes: PER, EOMI, sclera nonicteric.  Neck: Supple, no masses.  No bruit or JVD.  Pulmonary:  Good air movement, no audible wheezing, no use of accessory muscles.  Cardiac: RRR, normal S1, S2, no Murmurs. Vascular:  mild  trophic changes, no open wounds Vessel Right Left  Radial Palpable Palpable  PT Not Palpable Not Palpable  DP Not Palpable Not Palpable  Gastrointestinal: soft, non-distended. No guarding/no peritoneal signs.  Musculoskeletal: M/S 5/5 throughout.  No visible deformity.  Neurologic: CN 2-12 intact. Pain and light touch intact in extremities.  Symmetrical.  Speech is fluent. Motor exam as listed above. Psychiatric: Judgment intact, Mood & affect appropriate for pt's clinical situation. Dermatologic: No rashes or ulcers noted.  No changes consistent with cellulitis.   CBC Lab Results  Component Value Date   WBC 6.0 05/01/2021   HGB 12.8 05/01/2021   HCT 42.0 05/01/2021   MCV 85.9 05/01/2021   PLT 269 05/01/2021    BMET    Component Value Date/Time   NA 143 05/01/2021 0850   NA 139 07/15/2020 1112   NA 140 04/27/2014 0907   K 4.2 05/01/2021 0850   K 3.0 (L) 04/27/2014 0907   CL 106 05/01/2021 0850   CL 104 04/27/2014 0907   CO2  26 05/01/2021 0850   CO2 26 04/27/2014 0907   GLUCOSE 95 05/01/2021 0850   GLUCOSE 111 (H) 04/27/2014 0907   BUN 21 05/01/2021 0850   BUN 19 07/15/2020 1112   BUN 7 04/27/2014 0907   CREATININE 1.47 (H) 05/01/2021 0850   CREATININE 1.00 04/27/2014 0907   CALCIUM 9.0 05/01/2021 0850   CALCIUM 8.8 04/27/2014 0907   GFRNONAA 39 (L) 05/01/2021 0850   GFRNONAA >60 04/27/2014 0907   GFRNONAA 36 (L) 11/18/2013 0926   GFRAA 37 (L) 03/05/2020 1201   GFRAA >60 04/27/2014 0907   GFRAA 42 (L) 11/18/2013 0926   CrCl cannot be calculated (Patient's most recent lab result is older than the maximum 21 days allowed.).  COAG Lab Results  Component Value Date   INR 0.99 12/02/2014    Radiology No results found.   Assessment/Plan 1. Atherosclerosis of native artery of both lower extremities with intermittent claudication (HCC)  Recommend:  The patient has evidence of atherosclerosis of the lower extremities with claudication.  The patient does not  voice lifestyle limiting changes at this point in time.  Noninvasive studies do not suggest clinically significant change.  No invasive studies, angiography or surgery at this time The patient should continue walking and begin a more formal exercise program.  The patient should continue antiplatelet therapy and aggressive treatment of the lipid abnormalities  No changes in the patient's medications at this time  Continued surveillance is indicated as atherosclerosis is likely to progress with time.    The patient will continue follow up with noninvasive studies as ordered.    - VAS [place] LOWER EXTREMITY ARTERIAL DUPLEX; Future - VAS [place] ABI WITH/WO TBI; Future  2. Essential hypertension Continue antihypertensive medications as already ordered, these medications have been reviewed and there are no changes at this time.   3. Coronary artery disease due to calcified coronary lesion Continue cardiac and antihypertensive medications as already ordered and reviewed, no changes at this time.  Continue statin as ordered and reviewed, no changes at this time  Nitrates PRN for chest pain   4. Acute on chronic respiratory failure with hypoxia and hypercapnia (HCC) Continue pulmonary medications and aerosols as already ordered, these medications have been reviewed and there are no changes at this time.    5. Mixed hyperlipidemia Continue statin as ordered and reviewed, no changes at this time     Levora Dredge, MD  09/07/2021 1:01 PM

## 2021-09-08 ENCOUNTER — Encounter (INDEPENDENT_AMBULATORY_CARE_PROVIDER_SITE_OTHER): Payer: Self-pay | Admitting: Vascular Surgery

## 2021-09-08 ENCOUNTER — Ambulatory Visit (INDEPENDENT_AMBULATORY_CARE_PROVIDER_SITE_OTHER): Payer: Medicare Other | Admitting: Vascular Surgery

## 2021-09-08 VITALS — BP 156/73 | HR 66 | Resp 16 | Wt 273.4 lb

## 2021-09-08 DIAGNOSIS — E782 Mixed hyperlipidemia: Secondary | ICD-10-CM

## 2021-09-08 DIAGNOSIS — I70213 Atherosclerosis of native arteries of extremities with intermittent claudication, bilateral legs: Secondary | ICD-10-CM | POA: Diagnosis not present

## 2021-09-08 DIAGNOSIS — J9621 Acute and chronic respiratory failure with hypoxia: Secondary | ICD-10-CM | POA: Diagnosis not present

## 2021-09-08 DIAGNOSIS — I10 Essential (primary) hypertension: Secondary | ICD-10-CM | POA: Diagnosis not present

## 2021-09-08 DIAGNOSIS — I2584 Coronary atherosclerosis due to calcified coronary lesion: Secondary | ICD-10-CM

## 2021-09-08 DIAGNOSIS — I2510 Atherosclerotic heart disease of native coronary artery without angina pectoris: Secondary | ICD-10-CM

## 2021-09-08 DIAGNOSIS — J9622 Acute and chronic respiratory failure with hypercapnia: Secondary | ICD-10-CM

## 2021-09-14 ENCOUNTER — Encounter (INDEPENDENT_AMBULATORY_CARE_PROVIDER_SITE_OTHER): Payer: Self-pay | Admitting: Vascular Surgery

## 2021-09-18 ENCOUNTER — Other Ambulatory Visit: Payer: Self-pay | Admitting: Cardiovascular Disease

## 2021-09-19 NOTE — Telephone Encounter (Signed)
Please advise if our office is to continue refilling iron. Thank you!

## 2021-10-07 ENCOUNTER — Other Ambulatory Visit: Payer: Self-pay | Admitting: Cardiovascular Disease

## 2021-10-23 ENCOUNTER — Other Ambulatory Visit: Payer: Self-pay | Admitting: Family Medicine

## 2021-10-23 DIAGNOSIS — Z1231 Encounter for screening mammogram for malignant neoplasm of breast: Secondary | ICD-10-CM

## 2021-10-31 ENCOUNTER — Other Ambulatory Visit: Payer: Self-pay | Admitting: Cardiovascular Disease

## 2021-11-18 NOTE — Progress Notes (Signed)
Faxed a request for labs, notes and EKG

## 2021-11-19 ENCOUNTER — Ambulatory Visit
Admission: RE | Admit: 2021-11-19 | Discharge: 2021-11-19 | Disposition: A | Discharge status: Home / Self Care | Payer: Medicare (Managed Care) | Source: Ambulatory Visit | Attending: Family Medicine | Admitting: Family Medicine

## 2021-11-19 DIAGNOSIS — Z1231 Encounter for screening mammogram for malignant neoplasm of breast: Secondary | ICD-10-CM | POA: Diagnosis present

## 2021-11-23 NOTE — Progress Notes (Deleted)
Cardiology Office Note  Date:  11/23/2021   ID:  Kimberly Mccarthy, DOB Mar 03, 1956, MRN [id number]  PCP:  [facility]   No chief complaint on file.  HPI:  Kimberly Mccarthy is a 66 y.o. female with a hx of  CAD by cath 01/2020 , CABG HTN,  chronic pain,  hypothyroidism,  CKD, stage 3a PVD, followed at [facility] s/p RLE femoral endarterectomy with iliac artery thrombectomy 09/2014 and s/p LLE fem-PTA bypass with vein 12/2013 requiring angioplasty x2 for re-stenosis (last 04/2016). CVA,  COPD on QHS Bipap,  diastolic heart failure,  Morbid obesity who presents for follow-up of her coronary artery disease, cabg  LOV January 2023   In follow-up today discussed recent events, seen at [facility] for vascular disease Recent procedure discussed Had extremity arterial Doppler demonstrating stenosis Underwent angiogram lower extremities December 2022 Noted to have right CFA stenosis ,  drug coated balloon angioplasty  Patent LLE fem-PTA bypass.   Lab work March 2022 LDL 90 Reports that she is taking Lipitor 80 with Zetia  Weight up 270, Off lasix Left leg swelling seems to be getting worse but chronically left greater than right swelling Feels her weight is up 20 pounds, typically runs 250 pounds  Covid positive 3 weeks ago, 04/2021 Stayed at home  lives by herself able to take care of herself.  Her sister is nearby and helps with cleaning and grocery shopping.  EKG personally reviewed by myself on todays visit Normal sinus rhythm rate 65 bpm no significant ST-T wave changes  Of past medical history reviewed [facility]  October 2021 was discharged on IV Lasix, IV labetalol, obviously a mistake in her discharge medications   Prior records reviewed [facility] 02/12/2020 via EMS with acute on chronic hypoxic respiratory failure due to COPD and diastolic heart failure  intubated on admission.    Echo at that time showed EF 50 to 55%, RV normal size and function, pericardial  effusion anterior to the right ventricle, mild to moderate AS without stenosis.   Right and left heart cath showed severe two-vessel CAD with diffuse heavy calcification.  Sequential 90% proximal, 50% mid, 60% distal LAD stenosis as well as CTO at the ostium of codominant RCA.  Left circumflex with moderate disease that was diffuse.  She had severely elevated left heart and right heart pulmonary pressures.    transferred to [facility] for complex PCI versus CABG.   At [facility] her disease burden was determined to be appropriate for medical management and started on intensive medical therapy.    PMH:   has a past medical history of Anemia, AVM (arteriovenous malformation), Bronchitis, Carotid artery disease (HCC) (10/31/2015), Chronic respiratory failure (HCC), COPD (chronic obstructive pulmonary disease) (HCC), Critical ischemia of lower extremity (HCC) (07/18/2014), H/O blood clots, Hyperkalemia, Hypertension, Hypothyroidism (09/05/2014), Morbid obesity (HCC), PVD (peripheral vascular disease) (HCC), Renal failure, Stroke (HCC), and Thyroid disease.  PSH:    Past Surgical History:  Procedure Laterality Date   FEMORAL ENDARTERECTOMY Right    left foot surgery  02/2014   LEG SURGERY Left    RIGHT/LEFT HEART CATH AND CORONARY ANGIOGRAPHY N/A 02/16/2020   Procedure: RIGHT/LEFT HEART CATH AND CORONARY ANGIOGRAPHY;  Surgeon: Yvonne Kendall, MD;  Location: [facility] INVASIVE CV LAB;  Service: Cardiovascular;  Laterality: N/A;    Current Outpatient Medications  Medication Sig Dispense Refill   amLODipine (NORVASC) 10 MG tablet Take 1 tablet (10 mg total) by mouth daily. 90 tablet 3   ASPIRIN LOW DOSE 81  MG tablet TAKE 1 TABLET BY MOUTH ONE TIME A DAY FOR CVA 30 tablet 2   atorvastatin (LIPITOR) 80 MG tablet Take 1 tablet (80 mg total) by mouth daily. 90 tablet 3   carvedilol (COREG) 25 MG tablet Take 1 tablet (25 mg total) by mouth 2 (two) times daily with a meal. 180 tablet 3   clopidogrel (PLAVIX) 75 MG tablet Take  1 tablet (75 mg total) by mouth daily. 90 tablet 3   cyclobenzaprine (FLEXERIL) 10 MG tablet Take 1 tablet by mouth at bedtime.     empagliflozin (JARDIANCE) 10 MG TABS tablet Take 1 tablet (10 mg total) by mouth daily. 90 tablet 3   ezetimibe (ZETIA) 10 MG tablet Take 1 tablet (10 mg total) by mouth daily. 90 tablet 3   famotidine (PEPCID) 20 MG tablet TAKE 1 TABLET BY MOUTH TWICE A DAY RELATED TO GASTROESOPHAGEAL REFLUX DISEASE 180 tablet 0   FEROSUL 325 (65 Fe) MG tablet TAKE 1 TABLET BY MOUTH ONE TIME A DAY EVERY MONDAY, WEDNESDAY, AND FRIDAY FOR IRON SUPPLEMENT 12 tablet 6   furosemide (LASIX) 20 MG tablet Take 20 mg Monday, Wednesday and Friday and as needed for swelling 90 tablet 3   gabapentin (NEURONTIN) 300 MG capsule TAKE 1 CAPSULE BY MOUTH 3 TIMES A DAY FOR PAIN. DO NOT CRUSH 90 capsule 3   hydrALAZINE (APRESOLINE) 100 MG tablet TAKE 1 TABLET BY MOUTH IN THE MORNING, AT NOON AND AT BEDTIME 90 tablet 1   levothyroxine (SYNTHROID) 50 MCG tablet Take 50 mcg by mouth daily before breakfast.     loperamide (IMODIUM A-D) 2 MG tablet Take 1 tablet (2 mg total) by mouth 4 (four) times daily as needed for diarrhea or loose stools. 30 tablet 0   losartan (COZAAR) 50 MG tablet Take 1 tablet (50 mg total) by mouth daily. 90 tablet 3   Multiple Vitamin (MULTIVITAMIN ADULT PO) Take by mouth daily.     oxyCODONE (OXYCONTIN) 10 mg 12 hr tablet Take 10 mg by mouth every 12 (twelve) hours.      pantoprazole (PROTONIX) 40 MG tablet Take 40 mg by mouth 2 (two) times daily.     polyethylene glycol (MIRALAX / GLYCOLAX) 17 g packet Take 17 g by mouth daily. 14 each 0   senna (SENOKOT) 8.6 MG tablet Take 1 tablet by mouth daily.     Vitamin D, Ergocalciferol, (DRISDOL) 1.25 MG (50000 UNIT) CAPS capsule Take 50,000 Units by mouth every 7 (seven) days.     No current facility-administered medications for this visit.     Allergies:   Patient has no known allergies.   Social History:  The patient  reports  that she has never smoked. She has never used smokeless tobacco. She reports that she does not drink alcohol and does not use drugs.   Family History:   family history includes Diabetes in her mother; Hypertension in her mother and sister; Thyroid disease in her sister.    Review of Systems: Review of Systems  Constitutional: Negative.   HENT: Negative.    Respiratory: Negative.    Cardiovascular: Negative.   Gastrointestinal: Negative.   Musculoskeletal:  Positive for joint pain.  Neurological: Negative.   Psychiatric/Behavioral: Negative.    All other systems reviewed and are negative.    PHYSICAL EXAM: VS:  There were no vitals taken for this visit. , BMI There is no height or weight on file to calculate BMI. GEN: Well nourished, well developed, in no acute  distress HEENT: normal Neck: no JVD, carotid bruits, or masses Cardiac: RRR; no murmurs, rubs, or gallops,no edema  Respiratory:  clear to auscultation bilaterally, normal work of breathing GI: soft, nontender, nondistended, + BS MS: no deformity or atrophy Skin: warm and dry, no rash Neuro:  Strength and sensation are intact Psych: euthymic mood, full affect   Recent Labs: 05/01/2021: ALT 12; BUN 21; Creatinine, Ser 1.47; Hemoglobin 12.8; Platelets 269; Potassium 4.2; Sodium 143    Lipid Panel Lab Results  Component Value Date   CHOL 152 07/15/2020   HDL 47 07/15/2020   LDLCALC 90 07/15/2020   TRIG 77 07/15/2020      Wt Readings from Last 3 Encounters:  09/08/21 273 lb 6.4 oz (124 kg)  05/26/21 270 lb 4 oz (122.6 kg)  05/01/21 250 lb (113.4 kg)       ASSESSMENT AND PLAN:  Problem List Items Addressed This Visit   None Diastolic CHF Off her Lasix, weight up 20 pounds, left leg more swollen Drink lots of fluid when she had COVID 3 weeks ago Recommend she take Lasix 20 at least every other day and as needed for worsening swelling Was told she was dehydrated by nephrology in December 2022 when she was  taking Lasix daily  Morbid obesity Weight up 20 pounds Unable to exercise, low calorie diet recommended  Coronary artery disease with stable angina Lifestyle modification recommended, weight loss Goal LDL less than 70 Most recent LDL 90 but this was 1 year ago, scheduled for lab work at [place] [facility] March 2023 Recommend she check to make sure she is on atorvastatin 80 with Zetia 10 If numbers continue to run high would need Repatha/Praluent  COPD Weight loss recommended Reports symptoms stable    Total encounter time more than 30 minutes  Greater than 50% was spent in counseling and coordination of care with the patient    Signed, Dossie Arbour, M.D., Ph.D. [facility] [place], [place] [phone]

## 2021-11-24 ENCOUNTER — Ambulatory Visit: Payer: Medicare (Managed Care) | Admitting: Cardiovascular Disease

## 2021-11-24 DIAGNOSIS — K219 Gastro-esophageal reflux disease without esophagitis: Secondary | ICD-10-CM

## 2021-11-24 DIAGNOSIS — E785 Hyperlipidemia, unspecified: Secondary | ICD-10-CM

## 2021-11-24 DIAGNOSIS — I739 Peripheral vascular disease, unspecified: Secondary | ICD-10-CM

## 2021-11-24 DIAGNOSIS — I779 Disorder of arteries and arterioles, unspecified: Secondary | ICD-10-CM

## 2021-11-24 DIAGNOSIS — I10 Essential (primary) hypertension: Secondary | ICD-10-CM

## 2021-11-24 DIAGNOSIS — Z8673 Personal history of transient ischemic attack (TIA), and cerebral infarction without residual deficits: Secondary | ICD-10-CM

## 2021-11-24 DIAGNOSIS — I5032 Chronic diastolic (congestive) heart failure: Secondary | ICD-10-CM

## 2021-11-24 DIAGNOSIS — G4733 Obstructive sleep apnea (adult) (pediatric): Secondary | ICD-10-CM

## 2021-11-24 DIAGNOSIS — I25118 Atherosclerotic heart disease of native coronary artery with other forms of angina pectoris: Secondary | ICD-10-CM

## 2021-11-27 ENCOUNTER — Other Ambulatory Visit: Payer: Self-pay | Admitting: Cardiovascular Disease

## 2021-11-27 NOTE — Telephone Encounter (Signed)
--  Will you address this refill at appointment 11-27-2021

## 2021-11-27 NOTE — Progress Notes (Deleted)
Cardiology Office Note  Date:  11/27/2021   ID:  Kimberly Mccarthy, DOB October 18, 1955, MRN [id number]  PCP:  Center, [facility]   No chief complaint on file.  HPI:  Kimberly Mccarthy is a 66 y.o. female with a hx of  CAD by cath 01/2020 , CABG HTN,  chronic pain,  hypothyroidism,  CKD, stage 3a PVD, followed at [facility] s/p RLE femoral endarterectomy with iliac artery thrombectomy 09/2014 and s/p LLE fem-PTA bypass with vein 12/2013 requiring angioplasty x2 for re-stenosis (last 04/2016). CVA,  COPD on QHS Bipap,  diastolic heart failure,  Morbid obesity who presents for follow-up of her coronary artery disease, cabg  LOV January 2023   In follow-up today discussed recent events, seen at [facility] for vascular disease Recent procedure discussed Had extremity arterial Doppler demonstrating stenosis Underwent angiogram lower extremities December 2022 Noted to have right CFA stenosis ,  drug coated balloon angioplasty  Patent LLE fem-PTA bypass.   Lab work March 2022 LDL 90 Reports that she is taking Lipitor 80 with Zetia  Weight up 270, Off lasix Left leg swelling seems to be getting worse but chronically left greater than right swelling Feels her weight is up 20 pounds, typically runs 250 pounds  Covid positive 3 weeks ago, 04/2021 Stayed at home  lives by herself able to take care of herself.  Her sister is nearby and helps with cleaning and grocery shopping.  EKG personally reviewed by myself on todays visit Normal sinus rhythm rate 65 bpm no significant ST-T wave changes  Of past medical history reviewed [facility]  October 2021 was discharged on IV Lasix, IV labetalol, obviously a mistake in her discharge medications   Prior records reviewed [facility] 02/12/2020 via EMS with acute on chronic hypoxic respiratory failure due to COPD and diastolic heart failure  intubated on admission.    Echo at that time showed EF 50 to 55%, RV normal size and function, pericardial  effusion anterior to the right ventricle, mild to moderate AS without stenosis.   Right and left heart cath showed severe two-vessel CAD with diffuse heavy calcification.  Sequential 90% proximal, 50% mid, 60% distal LAD stenosis as well as CTO at the ostium of codominant RCA.  Left circumflex with moderate disease that was diffuse.  She had severely elevated left heart and right heart pulmonary pressures.    transferred to [facility] for complex PCI versus CABG.   At [facility] her disease burden was determined to be appropriate for medical management and started on intensive medical therapy.    PMH:   has a past medical history of Anemia, AVM (arteriovenous malformation), Bronchitis, Carotid artery disease (HCC) (10/31/2015), Chronic respiratory failure (HCC), COPD (chronic obstructive pulmonary disease) (HCC), Critical ischemia of lower extremity (HCC) (07/18/2014), H/O blood clots, Hyperkalemia, Hypertension, Hypothyroidism (09/05/2014), Morbid obesity (HCC), PVD (peripheral vascular disease) (HCC), Renal failure, Stroke (HCC), and Thyroid disease.  PSH:    Past Surgical History:  Procedure Laterality Date   FEMORAL ENDARTERECTOMY Right    left foot surgery  02/2014   LEG SURGERY Left    RIGHT/LEFT HEART CATH AND CORONARY ANGIOGRAPHY N/A 02/16/2020   Procedure: RIGHT/LEFT HEART CATH AND CORONARY ANGIOGRAPHY;  Surgeon: Yvonne Kendall, MD;  Location: [facility] INVASIVE CV LAB;  Service: Cardiovascular;  Laterality: N/A;    Current Outpatient Medications  Medication Sig Dispense Refill   amLODipine (NORVASC) 10 MG tablet Take 1 tablet (10 mg total) by mouth daily. 90 tablet 3   ASPIRIN LOW DOSE 81  MG tablet TAKE 1 TABLET BY MOUTH ONE TIME A DAY FOR CVA 30 tablet 2   atorvastatin (LIPITOR) 80 MG tablet Take 1 tablet (80 mg total) by mouth daily. 90 tablet 3   carvedilol (COREG) 25 MG tablet Take 1 tablet (25 mg total) by mouth 2 (two) times daily with a meal. 180 tablet 3   clopidogrel (PLAVIX) 75 MG tablet Take  1 tablet (75 mg total) by mouth daily. 90 tablet 3   cyclobenzaprine (FLEXERIL) 10 MG tablet Take 1 tablet by mouth at bedtime.     empagliflozin (JARDIANCE) 10 MG TABS tablet Take 1 tablet (10 mg total) by mouth daily. 90 tablet 3   ezetimibe (ZETIA) 10 MG tablet Take 1 tablet (10 mg total) by mouth daily. 90 tablet 3   famotidine (PEPCID) 20 MG tablet TAKE 1 TABLET BY MOUTH TWICE A DAY RELATED TO GASTROESOPHAGEAL REFLUX DISEASE 180 tablet 0   FEROSUL 325 (65 Fe) MG tablet TAKE 1 TABLET BY MOUTH ONE TIME A DAY EVERY MONDAY, WEDNESDAY, AND FRIDAY FOR IRON SUPPLEMENT 12 tablet 6   furosemide (LASIX) 20 MG tablet Take 20 mg Monday, Wednesday and Friday and as needed for swelling 90 tablet 3   gabapentin (NEURONTIN) 300 MG capsule TAKE 1 CAPSULE BY MOUTH 3 TIMES A DAY FOR PAIN. DO NOT CRUSH 90 capsule 3   hydrALAZINE (APRESOLINE) 100 MG tablet TAKE 1 TABLET BY MOUTH IN THE MORNING, AT NOON AND AT BEDTIME 90 tablet 1   levothyroxine (SYNTHROID) 50 MCG tablet Take 50 mcg by mouth daily before breakfast.     loperamide (IMODIUM A-D) 2 MG tablet Take 1 tablet (2 mg total) by mouth 4 (four) times daily as needed for diarrhea or loose stools. 30 tablet 0   losartan (COZAAR) 50 MG tablet Take 1 tablet (50 mg total) by mouth daily. 90 tablet 3   Multiple Vitamin (MULTIVITAMIN ADULT PO) Take by mouth daily.     oxyCODONE (OXYCONTIN) 10 mg 12 hr tablet Take 10 mg by mouth every 12 (twelve) hours.      pantoprazole (PROTONIX) 40 MG tablet Take 40 mg by mouth 2 (two) times daily.     polyethylene glycol (MIRALAX / GLYCOLAX) 17 g packet Take 17 g by mouth daily. 14 each 0   senna (SENOKOT) 8.6 MG tablet Take 1 tablet by mouth daily.     Vitamin D, Ergocalciferol, (DRISDOL) 1.25 MG (50000 UNIT) CAPS capsule Take 50,000 Units by mouth every 7 (seven) days.     No current facility-administered medications for this visit.     Allergies:   Patient has no known allergies.   Social History:  The patient  reports  that she has never smoked. She has never used smokeless tobacco. She reports that she does not drink alcohol and does not use drugs.   Family History:   family history includes Diabetes in her mother; Hypertension in her mother and sister; Thyroid disease in her sister.    Review of Systems: Review of Systems  Constitutional: Negative.   HENT: Negative.    Respiratory: Negative.    Cardiovascular: Negative.   Gastrointestinal: Negative.   Musculoskeletal:  Positive for joint pain.  Neurological: Negative.   Psychiatric/Behavioral: Negative.    All other systems reviewed and are negative.    PHYSICAL EXAM: VS:  There were no vitals taken for this visit. , BMI There is no height or weight on file to calculate BMI. GEN: Well nourished, well developed, in no acute  distress HEENT: normal Neck: no JVD, carotid bruits, or masses Cardiac: RRR; no murmurs, rubs, or gallops,no edema  Respiratory:  clear to auscultation bilaterally, normal work of breathing GI: soft, nontender, nondistended, + BS MS: no deformity or atrophy Skin: warm and dry, no rash Neuro:  Strength and sensation are intact Psych: euthymic mood, full affect   Recent Labs: 05/01/2021: ALT 12; BUN 21; Creatinine, Ser 1.47; Hemoglobin 12.8; Platelets 269; Potassium 4.2; Sodium 143    Lipid Panel Lab Results  Component Value Date   CHOL 152 07/15/2020   HDL 47 07/15/2020   LDLCALC 90 07/15/2020   TRIG 77 07/15/2020      Wt Readings from Last 3 Encounters:  09/08/21 273 lb 6.4 oz (124 kg)  05/26/21 270 lb 4 oz (122.6 kg)  05/01/21 250 lb (113.4 kg)       ASSESSMENT AND PLAN:  Problem List Items Addressed This Visit   None Diastolic CHF Off her Lasix, weight up 20 pounds, left leg more swollen Drink lots of fluid when she had COVID 3 weeks ago Recommend she take Lasix 20 at least every other day and as needed for worsening swelling Was told she was dehydrated by nephrology in December 2022 when she was  taking Lasix daily  Morbid obesity Weight up 20 pounds Unable to exercise, low calorie diet recommended  Coronary artery disease with stable angina Lifestyle modification recommended, weight loss Goal LDL less than 70 Most recent LDL 90 but this was 1 year ago, scheduled for lab work at [place] [facility] March 2023 Recommend she check to make sure she is on atorvastatin 80 with Zetia 10 If numbers continue to run high would need Repatha/Praluent  COPD Weight loss recommended Reports symptoms stable    Total encounter time more than 30 minutes  Greater than 50% was spent in counseling and coordination of care with the patient    Signed, Dossie Arbour, M.D., Ph.D. [facility] [place], [place] [phone]

## 2021-11-28 ENCOUNTER — Encounter: Payer: Self-pay | Admitting: Cardiovascular Disease

## 2021-11-28 ENCOUNTER — Ambulatory Visit: Payer: Medicaid Other | Admitting: Cardiovascular Disease

## 2021-11-28 DIAGNOSIS — Z8673 Personal history of transient ischemic attack (TIA), and cerebral infarction without residual deficits: Secondary | ICD-10-CM

## 2021-11-28 DIAGNOSIS — I739 Peripheral vascular disease, unspecified: Secondary | ICD-10-CM

## 2021-11-28 DIAGNOSIS — E785 Hyperlipidemia, unspecified: Secondary | ICD-10-CM

## 2021-11-28 DIAGNOSIS — G4733 Obstructive sleep apnea (adult) (pediatric): Secondary | ICD-10-CM

## 2021-11-28 DIAGNOSIS — I25118 Atherosclerotic heart disease of native coronary artery with other forms of angina pectoris: Secondary | ICD-10-CM

## 2021-11-28 DIAGNOSIS — I5032 Chronic diastolic (congestive) heart failure: Secondary | ICD-10-CM

## 2021-11-28 DIAGNOSIS — I10 Essential (primary) hypertension: Secondary | ICD-10-CM

## 2022-01-09 ENCOUNTER — Encounter: Payer: Self-pay | Admitting: Medical

## 2022-01-09 ENCOUNTER — Ambulatory Visit: Payer: Medicare (Managed Care) | Attending: Cardiovascular Disease | Admitting: Medical

## 2022-01-09 VITALS — BP 110/60 | HR 66 | Ht 66.0 in | Wt 263.1 lb

## 2022-01-09 DIAGNOSIS — N183 Chronic kidney disease, stage 3 unspecified: Secondary | ICD-10-CM | POA: Diagnosis not present

## 2022-01-09 DIAGNOSIS — I5030 Unspecified diastolic (congestive) heart failure: Secondary | ICD-10-CM | POA: Diagnosis not present

## 2022-01-09 DIAGNOSIS — I739 Peripheral vascular disease, unspecified: Secondary | ICD-10-CM

## 2022-01-09 DIAGNOSIS — I5032 Chronic diastolic (congestive) heart failure: Secondary | ICD-10-CM | POA: Diagnosis not present

## 2022-01-09 DIAGNOSIS — I779 Disorder of arteries and arterioles, unspecified: Secondary | ICD-10-CM

## 2022-01-09 NOTE — Progress Notes (Signed)
Cardiology Office Note:    Date:  01/09/2022   ID:  Kimberly Mccarthy, DOB November 13, 1955, MRN [id number]  PCP:  [facility], [place] Cardiologist:  Ida Rogue, MD  [place] Electrophysiologist:  None   Referring MD: Center, [facility]*   Chief Complaint: 6 month follow-up  History of Present Illness:    Kimberly Mccarthy is a 66 y.o. female with a hx of CD s/p CABG 01/2020, HTN, chronic pain, hypothyroidism, CKD stage 3, PVD s/p RLE femoral endarterectomy with iliac artery, thrombectomy 09/2014 and LLE fem-pop bypass with vein 12/2013 requiring angioplasty x2 for re-stenosis 02/2017, CVA, COPD on Bipap at night, chronic diastolic hear failure, morbid obesity who presents for 6 month follow-up.   Patient presented to [facility] 02/12/2020 via EMS with acute on chronic hypoxic respiratory failure due to COPD and diastolic heart failure intubated on admission.  Echo at that time showed EF 50 to 55%, RV normal size and function, pericardial effusion anterior to the right ventricle, mild to moderate AAS without stenosis.  She was extubated and reported chest pain with EKG changes worrisome for ischemia.  Right and left heart cath showed severe two-vessel CAD with diffuse heavy calcification.  Sequential 90% proximal, 50% mid, 60% distal LAD stenosis as well as CTO at the ostium of codominant RCA.  Left circumflex with moderate disease that was diffuse.  She had severely elevated left heart and right heart pulmonary pressures.  She was transferred to [facility] for complex PCI versus CABG.  At [facility] her disease burden was determined to be appropriate for medical management and started on intensive medical therapy.  Was ultimately discharged to SNF.   Last seen 04/2021 and was off her lasix per nephrology recommendations, with 20lbs weight gain. Recommended she take lasix 20mg  every other day.   Today, the patient report she is overall doing well. She is taking lasix 20mg  M, W,F. She  reports intermittent unchanged lower leg swelling. Swelling is worse int he left leg. No SOB, chest pain, orthopnea or pnd. She denies claudication. She will see nephrology later this month.   Past Medical History:  Diagnosis Date   Anemia    AVM (arteriovenous malformation)    Bronchitis    Carotid artery disease ([place]) 10/31/2015   Chronic respiratory failure (HCC)    COPD (chronic obstructive pulmonary disease) (HCC)    Critical ischemia of lower extremity ([place]) 07/18/2014   H/O blood clots    Hyperkalemia    Hypertension    Hypothyroidism 09/05/2014   Morbid obesity ([place])    PVD (peripheral vascular disease) ([place])    Renal failure    Stroke [facility])    may 2015   Thyroid disease     Past Surgical History:  Procedure Laterality Date   FEMORAL ENDARTERECTOMY Right    left foot surgery  02/2014   LEG SURGERY Left    RIGHT/LEFT HEART CATH AND CORONARY ANGIOGRAPHY N/A 02/16/2020   Procedure: RIGHT/LEFT HEART CATH AND CORONARY ANGIOGRAPHY;  Surgeon: Nelva Bush, MD;  Location: [place] CV LAB;  Service: Cardiovascular;  Laterality: N/A;    Current Medications: Current Meds  Medication Sig   amLODipine (NORVASC) 10 MG tablet Take 1 tablet (10 mg total) by mouth daily.   ASPIRIN LOW DOSE 81 MG tablet TAKE 1 TABLET BY MOUTH ONE TIME A DAY FOR CVA   atorvastatin (LIPITOR) 80 MG tablet Take 1 tablet (80 mg total) by mouth daily.   carvedilol (COREG) 25 MG tablet  Take 1 tablet (25 mg total) by mouth 2 (two) times daily with a meal.   clopidogrel (PLAVIX) 75 MG tablet Take 1 tablet (75 mg total) by mouth daily.   cyclobenzaprine (FLEXERIL) 10 MG tablet Take 1 tablet by mouth at bedtime.   empagliflozin (JARDIANCE) 10 MG TABS tablet Take 1 tablet (10 mg total) by mouth daily.   ezetimibe (ZETIA) 10 MG tablet Take 1 tablet (10 mg total) by mouth daily.   famotidine (PEPCID) 20 MG tablet TAKE 1 TABLET BY MOUTH TWICE A DAY RELATED TO GASTROESOPHAGEAL REFLUX DISEASE   FEROSUL 325 (65  Fe) MG tablet TAKE 1 TABLET BY MOUTH ONE TIME A DAY EVERY MONDAY, WEDNESDAY, AND FRIDAY FOR IRON SUPPLEMENT   furosemide (LASIX) 20 MG tablet Take 20 mg Monday, Wednesday and Friday and as needed for swelling   gabapentin (NEURONTIN) 300 MG capsule TAKE 1 CAPSULE BY MOUTH 3 TIMES A DAY FOR PAIN. DO NOT CRUSH   hydrALAZINE (APRESOLINE) 100 MG tablet TAKE 1 TABLET BY MOUTH IN THE MORNING, AT NOON AND AT BEDTIME   levothyroxine (SYNTHROID) 50 MCG tablet Take 50 mcg by mouth daily before breakfast.   loperamide (IMODIUM A-D) 2 MG tablet Take 1 tablet (2 mg total) by mouth 4 (four) times daily as needed for diarrhea or loose stools.   losartan (COZAAR) 50 MG tablet Take 1 tablet (50 mg total) by mouth daily.   Multiple Vitamin (MULTIVITAMIN ADULT PO) Take by mouth daily.   oxyCODONE (OXYCONTIN) 10 mg 12 hr tablet Take 10 mg by mouth every 12 (twelve) hours.    pantoprazole (PROTONIX) 40 MG tablet Take 40 mg by mouth 2 (two) times daily.   polyethylene glycol (MIRALAX / GLYCOLAX) 17 g packet Take 17 g by mouth daily.   senna (SENOKOT) 8.6 MG tablet Take 1 tablet by mouth daily.   Vitamin D, Ergocalciferol, (DRISDOL) 1.25 MG (50000 UNIT) CAPS capsule Take 50,000 Units by mouth every 7 (seven) days.     Allergies:   Patient has no known allergies.   Social History   Socioeconomic History   Marital status: Single    Spouse name: Not on file   Number of children: Not on file   Years of education: Not on file   Highest education level: Not on file  Occupational History   Not on file  Tobacco Use   Smoking status: Never   Smokeless tobacco: Never  Vaping Use   Vaping Use: Never used  Substance and Sexual Activity   Alcohol use: No    Alcohol/week: 0.0 standard drinks of alcohol   Drug use: No   Sexual activity: Never  Other Topics Concern   Not on file  Social History Narrative   ** Merged History Encounter **       Social Determinants of Health   Financial Resource Strain: Not on  file  Food Insecurity: Not on file  Transportation Needs: Not on file  Physical Activity: Not on file  Stress: Not on file  Social Connections: Not on file     Family History: The patient's family history includes Diabetes in her mother; Hypertension in her mother and sister; Thyroid disease in her sister. There is no history of Breast cancer.  ROS:   Please see the history of present illness.     All other systems reviewed and are negative.  EKGs/Labs/Other Studies Reviewed:    The following studies were reviewed today:  Cardiac cath 01/2020 Conclusions: Severe two vessel coronary artery  disease with diffuse, heavy calcification.  There are sequential 90% proximal, 50% mid, and 60% distal LAD stenoses, as well as chronic total occlusion at the ostium of a codominant RCA.  LCx has moderate diffuse disease. Severely elevated left heart, right heart, and pulmonary artery pressures. Low normal to mildly reduced Fick cardiac output/index.   Recommendations: Aggressive diuresis and improved blood pressure control for management of acute HFpEF. Transfer to tertiary care center for CABG versus PCI to LAD with atherectomy. Secondary prevention of coronary artery disease.   Nelva Bush, MD [facility]  Echo 01/2020 1. Left ventricular ejection fraction, by estimation, is 50 to 55%. The  left ventricle has low normal function. Left ventricular endocardial  border not optimally defined to evaluate regional wall motion. Left  ventricular diastolic parameters are  indeterminate.   2. Right ventricular systolic function is normal. The right ventricular  size is normal. Tricuspid regurgitation signal is inadequate for assessing  PA pressure.   3. The pericardial effusion is anterior to the right ventricle.   4. The mitral valve is normal in structure. No evidence of mitral valve  regurgitation. No evidence of mitral stenosis.   5. The aortic valve is normal in structure. Aortic  valve regurgitation is  not visualized. Mild to moderate aortic valve sclerosis/calcification is  present, without any evidence of aortic stenosis.   6. The inferior vena cava is normal in size with <50% respiratory  variability, suggesting right atrial pressure of 8 mmHg.   EKG:  EKG is ordered today.  The ekg ordered today demonstrates NSR 66bpm, low voltage, no changes  Recent Labs: 05/01/2021: ALT 12; BUN 21; Creatinine, Ser 1.47; Hemoglobin 12.8; Platelets 269; Potassium 4.2; Sodium 143  Recent Lipid Panel    Component Value Date/Time   CHOL 152 07/15/2020 1112   CHOL 215 (H) 09/16/2012 0428   TRIG 77 07/15/2020 1112   TRIG 105 09/16/2012 0428   HDL 47 07/15/2020 1112   HDL 56 09/16/2012 0428   CHOLHDL 3.2 07/15/2020 1112   CHOLHDL 5.5 02/15/2020 0919   VLDL 38 02/15/2020 0919   VLDL 21 09/16/2012 0428   LDLCALC 90 07/15/2020 1112   LDLCALC 138 (H) 09/16/2012 0428      Physical Exam:    VS:  BP 110/60 (BP Location: Left Arm, Patient Position: Sitting, Cuff Size: Large)   Pulse 66   Ht 5\' 6"  (1.676 m)   Wt 263 lb 2 oz (119.4 kg)   SpO2 97%   BMI 42.47 kg/m     Wt Readings from Last 3 Encounters:  01/09/22 263 lb 2 oz (119.4 kg)  09/08/21 273 lb 6.4 oz (124 kg)  05/26/21 270 lb 4 oz (122.6 kg)     GEN:  Well nourished, well developed in no acute distress HEENT: Normal NECK: No JVD; No carotid bruits LYMPHATICS: No lymphadenopathy CARDIAC: RRR, no murmurs, rubs, gallops RESPIRATORY:  Clear to auscultation without rales, wheezing or rhonchi  ABDOMEN: Soft, non-tender, non-distended MUSCULOSKELETAL:  No edema; No deformity  SKIN: Warm and dry NEUROLOGIC:  Alert and oriented x 3 PSYCHIATRIC:  Normal affect   ASSESSMENT:    1. Heart failure with preserved ejection fraction, unspecified HF chronicity ([place])   2. Chronic diastolic heart failure (HCC)   3. Carotid artery disease, unspecified laterality, unspecified type (HCC)   4. Stage 3 chronic kidney disease,  unspecified whether stage 3a or 3b CKD ([place])   5. PAD (peripheral artery disease) (HCC)    PLAN:  In order of problems listed above:  Chronic diastolic heart failure She is fairly euvolemic on exam. She takes lasix M, W, F. Echo from 2021 showed LVEF 50-55%. Continue Coreg 25mg  BID, Jardiance 10mg  daily, Hydralazine 100mg  BID, Losartan 50mg  daily.   CAD s/p CABG 2021 Patient denies chest pain. Continue Aspirin 81mg  daily, Lipitor 80mg  daily, Zetia, and Coreg 25mg  daily. No further ischemic work-up indicated at this time.   CKD stage 3 Patient follows with nephrology, she has an appointment later this month. She has labs back in January that were stable.  PVD  With multiple prior interventions. Followed  by Dr. . She denies significant claudication symptoms.   Disposition: Follow up in 6 month(s) with MD/APP   Signed, Eliane Hammersmith , PA-C  01/09/2022 2:33 PM    [facility]

## 2022-01-09 NOTE — Patient Instructions (Signed)
Medication Instructions:  Your physician recommends that you continue on your current medications as directed. Please refer to the Current Medication list given to you today.  *If you need a refill on your cardiac medications before your next appointment, please call your pharmacy*   Lab Work: None ordered If you have labs (blood work) drawn today and your tests are completely normal, you will receive your results only by: MyChart Message (if you have MyChart) OR A paper copy in the mail If you have any lab test that is abnormal or we need to change your treatment, we will call you to review the results.   Testing/Procedures: None ordered   Follow-Up: At [facility], you and your health needs are our priority.  As part of our continuing mission to provide you with exceptional heart care, we have created designated Provider Care Teams.  These Care Teams include your primary Cardiologist (physician) and Advanced Practice Providers (APPs -  Physician Assistants and Nurse Practitioners) who all work together to provide you with the care you need, when you need it.  We recommend signing up for the patient portal called "MyChart".  Sign up information is provided on this After Visit Summary.  MyChart is used to connect with patients for Virtual Visits (Telemedicine).  Patients are able to view lab/test results, encounter notes, upcoming appointments, etc.  Non-urgent messages can be sent to your provider as well.   To learn more about what you can do with MyChart, go to [email or web address].    Your next appointment:   6 month(s)  The format for your next appointment:   In Person  Provider:   You may see Julien Nordmann, MD or one of the following Advanced Practice Providers on your designated Care Team:   Nicolasa Ducking, NP Eula Listen, PA-C Cadence Fransico Michael, PA-C Charlsie Quest, NP   Other Instructions N/A  Important Information About Sugar

## 2022-01-14 ENCOUNTER — Telehealth: Payer: Self-pay | Admitting: Cardiovascular Disease

## 2022-01-14 NOTE — Telephone Encounter (Signed)
Patient wants to know if its okay for her to do aerobics and go to the spa.

## 2022-01-14 NOTE — Telephone Encounter (Signed)
Spoke with patient and she wants to know if she can do water aerobics, walking on treadmill, and walking program with spa treatment. Advised I would check with provider and then we will call her back. She verbalized understanding with no further questions at this time.

## 2022-01-15 NOTE — Telephone Encounter (Signed)
Reviewed approval from provider for her to do the activities listed in previous notes. She was very appreciative for the time with no further questions at this time.      Furth, Cadence H, PA-C  You 40 minutes ago (12:53 PM)    This is fine

## 2022-01-27 ENCOUNTER — Other Ambulatory Visit: Payer: Self-pay | Admitting: Cardiovascular Disease

## 2022-01-28 NOTE — Telephone Encounter (Signed)
Refill to pharmacy 

## 2022-03-02 ENCOUNTER — Other Ambulatory Visit (INDEPENDENT_AMBULATORY_CARE_PROVIDER_SITE_OTHER): Payer: Medicare Other

## 2022-03-02 ENCOUNTER — Ambulatory Visit (INDEPENDENT_AMBULATORY_CARE_PROVIDER_SITE_OTHER): Payer: Medicare Other | Admitting: Vascular Surgery

## 2022-03-02 ENCOUNTER — Encounter (INDEPENDENT_AMBULATORY_CARE_PROVIDER_SITE_OTHER): Payer: Medicare Other

## 2022-03-28 NOTE — Progress Notes (Signed)
MRN : [id number]  Kimberly Mccarthy is a 66 y.o. ([birth year]) female who presents with chief complaint of check circulation.  History of Present Illness:   The patient is seen for evaluation of painful lower extremities and diminished pulses.    LEFT LEG: LLE fem-PTA bypass with vein in 2015 with DCB angioplasty of the proximal anastomosis 2018. Regarding the LLE, patient has a history of abnormal duplex findings suggestive of stenosis of proximal anastomis, but she has multiple angiograms (most recent 02/2021) showing this is just a size mismatch. No intervention indicated but merits close follow up. Flow through graft today 33/10 cm/s.  RIGHT LEG: s/p RCFA [facility] angioplasty 03/06/21. ABI increased from 0.3 to 0.5 following intervention in Fall 2022; imaging today shows some continued CFA turbulence, but given patient's toe pressure is 54 mmHg and asymptomatic status, there is no further intervention indicated at this time.    Patient notes the pain is always associated with activity and is very consistent day today. Typically, the pain occurs at short distances, progress is as activity continues to the point that the patient must stop walking. Resting including standing still for several minutes allows the patient to walk a similar distance before being forced to stop again. The patient denies any recent abrupt changes in claudication symptoms.  The patient states the inability to walk is causing problems with daily activities.   The patient denies rest pain or dangling of an extremity off the side of the bed during the night for relief. No open wounds or sores at this time. No prior interventions or surgeries.   Interventions to date:  - 12/21/13: Left CFA and profunda endarterectomy with bovine patch angioplasty for CLI (tissue loss)  - 01/17/14: Left fem-PTA bypass with reversed GSV, CFA patch angioplasty/profundaplasty, balloon thrombectomy of the CFA, EIA, CIA, PFA for  CLI (rest pain)  - 10/16/14: Right femoral endarterectomy and patch angioplasty, R iliac artery fogarty thrombectomy, left CIA and CFA bypass angioplasties for RLE CLI  - 07/25/15: Balloon angioplasty of proximal anastomosis stenosis, suction thrombectomy  - 05/07/16: DCB angioplasty of LLE bypass proximal anastomosis - 03/06/21: BLE diagnostic arteriogram finding patent LLE bypass, DCB angioplasty of RCFA    No history of back problems or DJD of the lumbar sacral spine.    The patient's blood pressure has been stable and relatively well controlled. The patient denies amaurosis fugax or recent TIA symptoms. There are no recent neurological changes noted. The patient denies history of DVT, PE or superficial thrombophlebitis. The patient denies recent episodes of angina or shortness of breath.    ABI's today Rt=0.68 and Lt=0.77 (previous ABI's Rt0.54 and Lt=0.57) Left leg arterial duplex shows a > 70% stenosis of the proximal vein bypass  No outpatient medications have been marked as taking for the 03/30/22 encounter (Appointment) with Delana Meyer, Dolores Lory, MD.    Past Medical History:  Diagnosis Date   Anemia    AVM (arteriovenous malformation)    Bronchitis    Carotid artery disease ([place]) 10/31/2015   Chronic respiratory failure (HCC)    COPD (chronic obstructive pulmonary disease) ([place])    Critical ischemia of lower extremity ([place]) 07/18/2014   H/O blood clots    Hyperkalemia    Hypertension    Hypothyroidism 09/05/2014   Morbid obesity ([place])    PVD (peripheral vascular disease) ([place])    Renal failure    Stroke ([place])  may 2015   Thyroid disease     Past Surgical History:  Procedure Laterality Date   FEMORAL ENDARTERECTOMY Right    left foot surgery  02/2014   LEG SURGERY Left    RIGHT/LEFT HEART CATH AND CORONARY ANGIOGRAPHY N/A 02/16/2020   Procedure: RIGHT/LEFT HEART CATH AND CORONARY ANGIOGRAPHY;  Surgeon: Yvonne Kendall, MD;  Location: [facility] INVASIVE CV LAB;  Service:  Cardiovascular;  Laterality: N/A;    Social History Social History   Tobacco Use   Smoking status: Never   Smokeless tobacco: Never  Vaping Use   Vaping Use: Never used  Substance Use Topics   Alcohol use: No    Alcohol/week: 0.0 standard drinks of alcohol   Drug use: No    Family History Family History  Problem Relation Age of Onset   Diabetes Mother    Hypertension Mother    Hypertension Sister    Thyroid disease Sister    Breast cancer Neg Hx     No Known Allergies   REVIEW OF SYSTEMS (Negative unless checked)  Constitutional: [] Weight loss  [] Fever  [] Chills Cardiac: [] Chest pain   [] Chest pressure   [] Palpitations   [] Shortness of breath when laying flat   [] Shortness of breath with exertion. Vascular:  [x] Pain in legs with walking   [] Pain in legs at rest  [] History of DVT   [] Phlebitis   [] Swelling in legs   [] Varicose veins   [] Non-healing ulcers Pulmonary:   [] Uses home oxygen   [] Productive cough   [] Hemoptysis   [] Wheeze  [x] COPD   [] Asthma Neurologic:  [] Dizziness   [] Seizures   [] History of stroke   [] History of TIA  [] Aphasia   [] Vissual changes   [] Weakness or numbness in arm   [] Weakness or numbness in leg Musculoskeletal:   [] Joint swelling   [] Joint pain   [] Low back pain Hematologic:  [] Easy bruising  [] Easy bleeding   [] Hypercoagulable state   [] Anemic Gastrointestinal:  [] Diarrhea   [] Vomiting  [x] Gastroesophageal reflux/heartburn   [] Difficulty swallowing. Genitourinary:  [] Chronic kidney disease   [] Difficult urination  [] Frequent urination   [] Blood in urine Skin:  [] Rashes   [] Ulcers  Psychological:  [] History of anxiety   []  History of major depression.  Physical Examination  There were no vitals filed for this visit. There is no height or weight on file to calculate BMI. Gen: WD/WN, NAD Head: [place]/AT, No temporalis wasting.  Ear/Nose/Throat: Hearing grossly intact, nares w/o erythema or drainage Eyes: PER, EOMI, sclera nonicteric.  Neck:  Supple, no masses.  No bruit or JVD.  Pulmonary:  Good air movement, no audible wheezing, no use of accessory muscles.  Cardiac: RRR, normal S1, S2, no Murmurs. Vascular:  mild trophic changes, no open wounds Vessel Right Left  Radial Palpable Palpable  PT Not Palpable Not Palpable  DP Not Palpable Not Palpable  Gastrointestinal: soft, non-distended. No guarding/no peritoneal signs.  Musculoskeletal: M/S 5/5 throughout.  No visible deformity.  Neurologic: CN 2-12 intact. Pain and light touch intact in extremities.  Symmetrical.  Speech is fluent. Motor exam as listed above. Psychiatric: Judgment intact, Mood & affect appropriate for pt's clinical situation. Dermatologic: No rashes or ulcers noted.  No changes consistent with cellulitis.   CBC Lab Results  Component Value Date   WBC 6.0 05/01/2021   HGB 12.8 05/01/2021   HCT 42.0 05/01/2021   MCV 85.9 05/01/2021   PLT 269 05/01/2021    BMET    Component Value Date/Time   NA 143 05/01/2021  0850   NA 139 07/15/2020 1112   NA 140 04/27/2014 0907   K 4.2 05/01/2021 0850   K 3.0 (L) 04/27/2014 0907   CL 106 05/01/2021 0850   CL 104 04/27/2014 0907   CO2 26 05/01/2021 0850   CO2 26 04/27/2014 0907   GLUCOSE 95 05/01/2021 0850   GLUCOSE 111 (H) 04/27/2014 0907   BUN 21 05/01/2021 0850   BUN 19 07/15/2020 1112   BUN 7 04/27/2014 0907   CREATININE 1.47 (H) 05/01/2021 0850   CREATININE 1.00 04/27/2014 0907   CALCIUM 9.0 05/01/2021 0850   CALCIUM 8.8 04/27/2014 0907   GFRNONAA 39 (L) 05/01/2021 0850   GFRNONAA >60 04/27/2014 0907   GFRNONAA 36 (L) 11/18/2013 0926   GFRAA 37 (L) 03/05/2020 1201   GFRAA >60 04/27/2014 0907   GFRAA 42 (L) 11/18/2013 0926   CrCl cannot be calculated (Patient's most recent lab result is older than the maximum 21 days allowed.).  COAG Lab Results  Component Value Date   INR 0.99 12/02/2014    Radiology No results found.   Assessment/Plan 1. Atherosclerosis of native artery of both  lower extremities with intermittent claudication (HCC)  Recommend:  The patient has evidence of atherosclerosis of the lower extremities with claudication.  The patient does not voice lifestyle limiting changes at this point in time.  Noninvasive studies do not suggest clinically significant change.  So, although I discussed intervention the patient feels she is doing well and is about the same so she is hesitant to have an angiogram at this time.  No invasive studies, angiography or surgery at this time The patient should continue walking and begin a more formal exercise program.  The patient should continue antiplatelet therapy and aggressive treatment of the lipid abnormalities  No changes in the patient's medications at this time  Continued surveillance is indicated as atherosclerosis is likely to progress with time.    The patient will continue follow up with noninvasive studies as ordered.  - VAS [place] LOWER EXTREMITY ARTERIAL DUPLEX; Future - VAS [place] ABI WITH/WO TBI; Future  2. Essential hypertension Continue antihypertensive medications as already ordered, these medications have been reviewed and there are no changes at this time.  3. Non-ST elevation (NSTEMI) myocardial infarction [facility]) Continue cardiac and antihypertensive medications as already ordered and reviewed, no changes at this time.  Continue statin as ordered and reviewed, no changes at this time  Nitrates PRN for chest pain  4. Acute on chronic respiratory failure with hypoxia and hypercapnia (HCC) Continue pulmonary medications and aerosols as already ordered, these medications have been reviewed and there are no changes at this time.   5. Mixed hyperlipidemia Continue statin as ordered and reviewed, no changes at this time    Hortencia Pilar, MD  03/28/2022 2:56 PM

## 2022-03-30 ENCOUNTER — Encounter (INDEPENDENT_AMBULATORY_CARE_PROVIDER_SITE_OTHER): Payer: Self-pay | Admitting: Vascular Surgery

## 2022-03-30 ENCOUNTER — Ambulatory Visit (INDEPENDENT_AMBULATORY_CARE_PROVIDER_SITE_OTHER): Payer: Medicare (Managed Care)

## 2022-03-30 ENCOUNTER — Other Ambulatory Visit (INDEPENDENT_AMBULATORY_CARE_PROVIDER_SITE_OTHER): Payer: Self-pay | Admitting: Vascular Surgery

## 2022-03-30 ENCOUNTER — Ambulatory Visit (INDEPENDENT_AMBULATORY_CARE_PROVIDER_SITE_OTHER): Payer: Medicare (Managed Care) | Admitting: Vascular Surgery

## 2022-03-30 VITALS — BP 146/80 | HR 80 | Resp 16 | Ht 66.0 in | Wt 260.0 lb

## 2022-03-30 DIAGNOSIS — J9622 Acute and chronic respiratory failure with hypercapnia: Secondary | ICD-10-CM

## 2022-03-30 DIAGNOSIS — I739 Peripheral vascular disease, unspecified: Secondary | ICD-10-CM

## 2022-03-30 DIAGNOSIS — Z9889 Other specified postprocedural states: Secondary | ICD-10-CM

## 2022-03-30 DIAGNOSIS — I70213 Atherosclerosis of native arteries of extremities with intermittent claudication, bilateral legs: Secondary | ICD-10-CM

## 2022-03-30 DIAGNOSIS — I10 Essential (primary) hypertension: Secondary | ICD-10-CM

## 2022-03-30 DIAGNOSIS — J9621 Acute and chronic respiratory failure with hypoxia: Secondary | ICD-10-CM

## 2022-03-30 DIAGNOSIS — I214 Non-ST elevation (NSTEMI) myocardial infarction: Secondary | ICD-10-CM | POA: Diagnosis not present

## 2022-03-30 DIAGNOSIS — E782 Mixed hyperlipidemia: Secondary | ICD-10-CM

## 2022-04-05 ENCOUNTER — Encounter (INDEPENDENT_AMBULATORY_CARE_PROVIDER_SITE_OTHER): Payer: Self-pay | Admitting: Vascular Surgery

## 2022-04-23 ENCOUNTER — Other Ambulatory Visit: Payer: Self-pay | Admitting: Cardiovascular Disease

## 2022-05-06 ENCOUNTER — Other Ambulatory Visit: Payer: Self-pay | Admitting: Cardiovascular Disease

## 2022-05-06 DIAGNOSIS — I5032 Chronic diastolic (congestive) heart failure: Secondary | ICD-10-CM

## 2022-05-21 ENCOUNTER — Other Ambulatory Visit: Payer: Self-pay | Admitting: Cardiovascular Disease

## 2022-05-31 ENCOUNTER — Other Ambulatory Visit: Payer: Self-pay | Admitting: Cardiovascular Disease

## 2022-06-11 ENCOUNTER — Other Ambulatory Visit: Payer: Self-pay | Admitting: Cardiovascular Disease

## 2022-06-22 ENCOUNTER — Other Ambulatory Visit: Payer: Self-pay

## 2022-06-22 MED ORDER — ASPIRIN 81 MG PO TBEC: DELAYED_RELEASE_TABLET | ORAL | 3 refills | Status: DC

## 2022-06-22 MED ORDER — FAMOTIDINE 20 MG PO TABS: ORAL_TABLET | ORAL | 0 refills | Status: DC

## 2022-06-28 NOTE — Progress Notes (Signed)
MRN : [id number]  Kimberly Mccarthy is a 67 y.o. (09-Sep-1955) female who presents with chief complaint of check circulation.  History of Present Illness:  The patient is seen for evaluation of painful lower extremities and diminished pulses.    LEFT LEG: LLE fem-PTA bypass with vein in 2015 with DCB angioplasty of the proximal anastomosis 2018. Regarding the LLE, patient has a history of abnormal duplex findings suggestive of stenosis of proximal anastomis, but she has multiple angiograms (most recent 02/2021) showing this is just a size mismatch. No intervention indicated but merits close follow up. Flow through graft today 33/10 cm/s.  RIGHT LEG: s/p RCFA [facility] angioplasty 03/06/21. ABI increased from 0.3 to 0.5 following intervention in Fall 2022; imaging today shows some continued CFA turbulence, but given patient's toe pressure is 54 mmHg and asymptomatic status, there is no further intervention indicated at this time.    Patient notes the pain is always associated with activity and is very consistent day today. Typically, the pain occurs at short distances, progress is as activity continues to the point that the patient must stop walking. Resting including standing still for several minutes allows the patient to walk a similar distance before being forced to stop again. The patient denies any recent abrupt changes in claudication symptoms.  The patient states the inability to walk is causing problems with daily activities.   The patient denies rest pain or dangling of an extremity off the side of the bed during the night for relief. No open wounds or sores at this time. No prior interventions or surgeries.   Interventions to date:  - 12/21/13: Left CFA and profunda endarterectomy with bovine patch angioplasty for CLI (tissue loss)  - 01/17/14: Left fem-PTA bypass with reversed GSV, CFA patch angioplasty/profundaplasty, balloon thrombectomy of the CFA, EIA, CIA, PFA for CLI  (rest pain)  - 10/16/14: Right femoral endarterectomy and patch angioplasty, R iliac artery fogarty thrombectomy, left CIA and CFA bypass angioplasties for RLE CLI  - 07/25/15: Balloon angioplasty of proximal anastomosis stenosis, suction thrombectomy  - 05/07/16: DCB angioplasty of LLE bypass proximal anastomosis - 03/06/21: BLE diagnostic arteriogram finding patent LLE bypass, DCB angioplasty of RCFA    No history of back problems or DJD of the lumbar sacral spine.    The patient's blood pressure has been stable and relatively well controlled. The patient denies amaurosis fugax or recent TIA symptoms. There are no recent neurological changes noted. The patient denies history of DVT, PE or superficial thrombophlebitis. The patient denies recent episodes of angina or shortness of breath.     ABI's today Rt=0.55 and Lt=0.58 (previous ABI's Rt=0.68 and Lt=0.77) Left leg arterial duplex shows a patent femoral to posterior tibial bypass graft with a 50-70% stenosis of the proximal vein bypass   No outpatient medications have been marked as taking for the 06/29/22 encounter (Appointment) with Katha Cabal, MD.    Past Medical History:  Diagnosis Date   Anemia    AVM (arteriovenous malformation)    Bronchitis    Carotid artery disease ([place]) 10/31/2015   Chronic respiratory failure (HCC)    COPD (chronic obstructive pulmonary disease) ([place])    Critical ischemia of lower extremity ([place]) 07/18/2014   H/O blood clots    Hyperkalemia    Hypertension    Hypothyroidism 09/05/2014   Morbid obesity ([place])    PVD (peripheral vascular disease) ([place])  Renal failure    Stroke [facility])    may 2015   Thyroid disease     Past Surgical History:  Procedure Laterality Date   FEMORAL ENDARTERECTOMY Right    left foot surgery  02/2014   LEG SURGERY Left    RIGHT/LEFT HEART CATH AND CORONARY ANGIOGRAPHY N/A 02/16/2020   Procedure: RIGHT/LEFT HEART CATH AND CORONARY ANGIOGRAPHY;  Surgeon: Nelva Bush, MD;  Location: [place] CV LAB;  Service: Cardiovascular;  Laterality: N/A;    Social History Social History   Tobacco Use   Smoking status: Never   Smokeless tobacco: Never  Vaping Use   Vaping Use: Never used  Substance Use Topics   Alcohol use: No    Alcohol/week: 0.0 standard drinks of alcohol   Drug use: No    Family History Family History  Problem Relation Age of Onset   Diabetes Mother    Hypertension Mother    Hypertension Sister    Thyroid disease Sister    Breast cancer Neg Hx     No Known Allergies   REVIEW OF SYSTEMS (Negative unless checked)  Constitutional: [] Weight loss  [] Fever  [] Chills Cardiac: [] Chest pain   [] Chest pressure   [] Palpitations   [] Shortness of breath when laying flat   [] Shortness of breath with exertion. Vascular:  [x] Pain in legs with walking   [] Pain in legs at rest  [] History of DVT   [] Phlebitis   [] Swelling in legs   [] Varicose veins   [] Non-healing ulcers Pulmonary:   [] Uses home oxygen   [] Productive cough   [] Hemoptysis   [] Wheeze  [] COPD   [] Asthma Neurologic:  [] Dizziness   [] Seizures   [] History of stroke   [] History of TIA  [] Aphasia   [] Vissual changes   [] Weakness or numbness in arm   [] Weakness or numbness in leg Musculoskeletal:   [] Joint swelling   [] Joint pain   [] Low back pain Hematologic:  [] Easy bruising  [] Easy bleeding   [] Hypercoagulable state   [] Anemic Gastrointestinal:  [] Diarrhea   [] Vomiting  [] Gastroesophageal reflux/heartburn   [] Difficulty swallowing. Genitourinary:  [] Chronic kidney disease   [] Difficult urination  [] Frequent urination   [] Blood in urine Skin:  [] Rashes   [] Ulcers  Psychological:  [] History of anxiety   []  History of major depression.  Physical Examination  There were no vitals filed for this visit. There is no height or weight on file to calculate BMI. Gen: WD/WN, NAD Head: [place]/AT, No temporalis wasting.  Ear/Nose/Throat: Hearing grossly intact, nares w/o erythema  or drainage Eyes: PER, EOMI, sclera nonicteric.  Neck: Supple, no masses.  No bruit or JVD.  Pulmonary:  Good air movement, no audible wheezing, no use of accessory muscles.  Cardiac: RRR, normal S1, S2, no Murmurs. Vascular:  mild trophic changes, no open wounds Vessel Right Left  Radial Palpable Palpable  PT Not Palpable Trace Palpable  DP Not Palpable Not Palpable  Gastrointestinal: soft, non-distended. No guarding/no peritoneal signs.  Musculoskeletal: M/S 5/5 throughout.  No visible deformity.  Neurologic: CN 2-12 intact. Pain and light touch intact in extremities.  Symmetrical.  Speech is fluent. Motor exam as listed above. Psychiatric: Judgment intact, Mood & affect appropriate for pt's clinical situation. Dermatologic: No rashes or ulcers noted.  No changes consistent with cellulitis.   CBC Lab Results  Component Value Date   WBC 6.0 05/01/2021   HGB 12.8 05/01/2021   HCT 42.0 05/01/2021   MCV 85.9 05/01/2021   PLT 269 05/01/2021    BMET  Component Value Date/Time   NA 143 05/01/2021 0850   NA 139 07/15/2020 1112   NA 140 04/27/2014 0907   K 4.2 05/01/2021 0850   K 3.0 (L) 04/27/2014 0907   CL 106 05/01/2021 0850   CL 104 04/27/2014 0907   CO2 26 05/01/2021 0850   CO2 26 04/27/2014 0907   GLUCOSE 95 05/01/2021 0850   GLUCOSE 111 (H) 04/27/2014 0907   BUN 21 05/01/2021 0850   BUN 19 07/15/2020 1112   BUN 7 04/27/2014 0907   CREATININE 1.47 (H) 05/01/2021 0850   CREATININE 1.00 04/27/2014 0907   CALCIUM 9.0 05/01/2021 0850   CALCIUM 8.8 04/27/2014 0907   GFRNONAA 39 (L) 05/01/2021 0850   GFRNONAA >60 04/27/2014 0907   GFRNONAA 36 (L) 11/18/2013 0926   GFRAA 37 (L) 03/05/2020 1201   GFRAA >60 04/27/2014 0907   GFRAA 42 (L) 11/18/2013 0926   CrCl cannot be calculated (Patient's most recent lab result is older than the maximum 21 days allowed.).  COAG Lab Results  Component Value Date   INR 0.99 12/02/2014    Radiology No results  found.   Assessment/Plan 1. Atherosclerosis of native artery of both lower extremities with intermittent claudication (HCC)  Recommend:   The patient has evidence of atherosclerosis of the lower extremities with claudication.  The patient does not voice lifestyle limiting changes at this point in time.   Noninvasive studies do not suggest clinically significant change.   I have discussed intervention with the patient.  However, she feels she is doing well and is about the same so she is hesitant to have an angiogram at this time.   No invasive studies, angiography or surgery at this time The patient should continue walking and begin a more formal exercise program.  The patient should continue antiplatelet therapy and aggressive treatment of the lipid abnormalities   No changes in the patient's medications at this time   Continued surveillance is indicated as atherosclerosis is likely to progress with time.     The patient will continue follow up with noninvasive studies as ordered.  - VAS [place] ABI WITH/WO TBI; Future  2. Essential hypertension Continue antihypertensive medications as already ordered, these medications have been reviewed and there are no changes at this time.  3. Coronary artery disease due to calcified coronary lesion Continue cardiac and antihypertensive medications as already ordered and reviewed, no changes at this time.  Continue statin as ordered and reviewed, no changes at this time  Nitrates PRN for chest pain  4. COPD with acute bronchitis ([place]) Continue pulmonary medications and aerosols as already ordered, these medications have been reviewed and there are no changes at this time.   5. Mixed hyperlipidemia Continue statin as ordered and reviewed, no changes at this time    Hortencia Pilar, MD  06/28/2022 9:23 PM

## 2022-06-29 ENCOUNTER — Ambulatory Visit (INDEPENDENT_AMBULATORY_CARE_PROVIDER_SITE_OTHER): Payer: 59 | Admitting: Vascular Surgery

## 2022-06-29 ENCOUNTER — Encounter (INDEPENDENT_AMBULATORY_CARE_PROVIDER_SITE_OTHER): Payer: Self-pay | Admitting: Vascular Surgery

## 2022-06-29 ENCOUNTER — Ambulatory Visit (INDEPENDENT_AMBULATORY_CARE_PROVIDER_SITE_OTHER): Payer: 59

## 2022-06-29 VITALS — BP 154/76 | HR 67 | Resp 18 | Ht 66.0 in | Wt 261.0 lb

## 2022-06-29 DIAGNOSIS — I10 Essential (primary) hypertension: Secondary | ICD-10-CM | POA: Diagnosis not present

## 2022-06-29 DIAGNOSIS — I2510 Atherosclerotic heart disease of native coronary artery without angina pectoris: Secondary | ICD-10-CM

## 2022-06-29 DIAGNOSIS — J440 Chronic obstructive pulmonary disease with acute lower respiratory infection: Secondary | ICD-10-CM | POA: Diagnosis not present

## 2022-06-29 DIAGNOSIS — J209 Acute bronchitis, unspecified: Secondary | ICD-10-CM

## 2022-06-29 DIAGNOSIS — I70213 Atherosclerosis of native arteries of extremities with intermittent claudication, bilateral legs: Secondary | ICD-10-CM

## 2022-06-29 DIAGNOSIS — E782 Mixed hyperlipidemia: Secondary | ICD-10-CM

## 2022-06-29 DIAGNOSIS — I2584 Coronary atherosclerosis due to calcified coronary lesion: Secondary | ICD-10-CM

## 2022-06-29 NOTE — Progress Notes (Unsigned)
Cardiology Office Note  Date:  06/30/2022   ID:  Kimberly Mccarthy, DOB 1955-12-27, MRN [id number]  PCP:  [facility]   Chief Complaint  Patient presents with   6 month follow up     "Doing well." Medications reviewed by the patient verbally.    HPI:  Kimberly Mccarthy is a 67 y.o. female with a hx of  CAD by cath 01/2020 ,   At [facility] her disease burden was determined to be appropriate for medical management and started on intensive medical therapy  HTN,  chronic pain,  hypothyroidism,  CKD, stage 3a PVD, followed at [facility] s/p RLE femoral endarterectomy with iliac artery thrombectomy 09/2014 and s/p LLE fem-PTA bypass with vein 12/2013 requiring angioplasty x2 for re-stenosis (last 04/2016). CVA,  COPD on QHS Bipap,  diastolic heart failure,  Morbid obesity who presents for follow-up of her coronary artery disease, cabg  LOV 1/23 Seen by one of our providers 9/23 In follow-up today she reports that she feels well Continues to take Lasix 3x a week, legs swelling stable, minimal Not smoking No regular exercise program, walks with a walker Denies chest pain concerning for angina Reports compliance with her medications  No recent lipids available Lab work March 2022 LDL 90  Followed by [facility] for vascular disease Underwent angiogram lower extremities December 2022 right CFA stenosis , drug coated balloon angioplasty  Patent LLE fem-PTA bypass.   Covid positive 04/2021  EKG personally reviewed by myself on todays visit Normal sinus rhythm rate 71 bpm no significant ST-T wave changes  Other past medical history reviewed [facility]  October 2021 was discharged on IV Lasix, IV labetalol, obviously a mistake in her discharge medications  [facility] 02/12/2020 via EMS with acute on chronic hypoxic respiratory failure due to COPD and diastolic heart failure  intubated on admission.    Echo at that time showed EF 50 to 55%, RV normal size and function, pericardial effusion  anterior to the right ventricle, mild to moderate AS without stenosis.   Right and left heart cath showed severe two-vessel CAD with diffuse heavy calcification.  Sequential 90% proximal, 50% mid, 60% distal LAD stenosis as well as CTO at the ostium of codominant RCA.  Left circumflex with moderate disease that was diffuse.  She had severely elevated left heart and right heart pulmonary pressures.    transferred to [facility] for complex PCI versus CABG.   At [facility] her disease burden was determined to be appropriate for medical management and started on intensive medical therapy.    PMH:   has a past medical history of Anemia, AVM (arteriovenous malformation), Bronchitis, Carotid artery disease ([place]) (10/31/2015), Chronic respiratory failure (HCC), COPD (chronic obstructive pulmonary disease) ([place]), Critical ischemia of lower extremity ([place]) (07/18/2014), H/O blood clots, Hyperkalemia, Hypertension, Hypothyroidism (09/05/2014), Morbid obesity ([place]), PVD (peripheral vascular disease) ([place]), Renal failure, Stroke ([place]), and Thyroid disease.  PSH:    Past Surgical History:  Procedure Laterality Date   FEMORAL ENDARTERECTOMY Right    left foot surgery  02/2014   LEG SURGERY Left    RIGHT/LEFT HEART CATH AND CORONARY ANGIOGRAPHY N/A 02/16/2020   Procedure: RIGHT/LEFT HEART CATH AND CORONARY ANGIOGRAPHY;  Surgeon: Nelva Bush, MD;  Location: [facility];  Service: Cardiovascular;  Laterality: N/A;    Current Outpatient Medications  Medication Sig Dispense Refill   amLODipine (NORVASC) 10 MG tablet TAKE 1 TABLET BY MOUTH DAILY 90 tablet 0   aspirin EC (ASPIRIN LOW DOSE) 81 MG  tablet TAKE 1 TABLET BY MOUTH DAILY FOR CVA 30 tablet 3   atorvastatin (LIPITOR) 80 MG tablet TAKE 1 TABLET BY MOUTH DAILY 90 tablet 0   carvedilol (COREG) 25 MG tablet TAKE 1 TABLET BY MOUTH 2 TIMES DAILY WITH A MEAL 180 tablet 0   clopidogrel (PLAVIX) 75 MG tablet TAKE 1 TABLET BY MOUTH DAILY 90 tablet 0   cyclobenzaprine  (FLEXERIL) 10 MG tablet Take 1 tablet by mouth at bedtime.     empagliflozin (JARDIANCE) 10 MG TABS tablet TAKE 1 TABLET BY MOUTH DAILY 90 tablet 0   ezetimibe (ZETIA) 10 MG tablet TAKE 1 TABLET BY MOUTH DAILY 90 tablet 0   famotidine (PEPCID) 20 MG tablet TAKE 1 TABLET BY MOUTH TWICE A DAY RELATED TO GASTROESOPHAGEAL REFLUX DISEASE 180 tablet 0   FEROSUL 325 (65 Fe) MG tablet TAKE 1 TABLET BY MOUTH ONE TIME A DAY EVERY MONDAY, WEDNESDAY, AND FRIDAY FOR IRON SUPPLEMENT 12 tablet 6   furosemide (LASIX) 20 MG tablet TAKE 1 TABLET BY MOUTH ON MONDAY, WEDNESDAY AND FRIDAY AND AS NEEDED FOR SWELLING 12 tablet 1   gabapentin (NEURONTIN) 300 MG capsule TAKE 1 CAPSULE BY MOUTH 3 TIMES A DAY FOR PAIN. DO NOT CRUSH 90 capsule 3   hydrALAZINE (APRESOLINE) 100 MG tablet TAKE 1 TABLET BY MOUTH IN THE MORNING, AT NOON AND AT BEDTIME 90 tablet 1   levothyroxine (SYNTHROID) 50 MCG tablet Take 50 mcg by mouth daily before breakfast.     loperamide (IMODIUM A-D) 2 MG tablet Take 1 tablet (2 mg total) by mouth 4 (four) times daily as needed for diarrhea or loose stools. 30 tablet 0   losartan (COZAAR) 50 MG tablet TAKE 1 TABLET BY MOUTH DAILY 90 tablet 0   Multiple Vitamin (MULTIVITAMIN ADULT PO) Take by mouth daily.     oxyCODONE (OXYCONTIN) 10 mg 12 hr tablet Take 10 mg by mouth every 12 (twelve) hours.      pantoprazole (PROTONIX) 40 MG tablet Take 40 mg by mouth 2 (two) times daily.     polyethylene glycol (MIRALAX / GLYCOLAX) 17 g packet Take 17 g by mouth daily. 14 each 0   senna (SENOKOT) 8.6 MG tablet Take 1 tablet by mouth daily.     Vitamin D, Ergocalciferol, (DRISDOL) 1.25 MG (50000 UNIT) CAPS capsule Take 50,000 Units by mouth every 7 (seven) days.     No current facility-administered medications for this visit.     Allergies:   Patient has no known allergies.   Social History:  The patient  reports that she has never smoked. She has never used smokeless tobacco. She reports that she does not drink  alcohol and does not use drugs.   Family History:   family history includes Diabetes in her mother; Hypertension in her mother and sister; Thyroid disease in her sister.    Review of Systems: Review of Systems  Constitutional: Negative.   HENT: Negative.    Respiratory: Negative.    Cardiovascular: Negative.   Gastrointestinal: Negative.   Musculoskeletal:  Positive for joint pain.  Neurological: Negative.   Psychiatric/Behavioral: Negative.    All other systems reviewed and are negative.   PHYSICAL EXAM: VS:  BP 122/62 (BP Location: Left Arm, Patient Position: Sitting, Cuff Size: Large)   Pulse 71   Ht '5\' 6"'$  (1.676 m)   Wt 263 lb 2 oz (119.4 kg)   SpO2 94%   BMI 42.47 kg/m  , BMI Body mass index is 42.47 kg/m.  GEN: Well nourished, well developed, in no acute distress HEENT: normal Neck: no JVD, carotid bruits, or masses Cardiac: RRR; no murmurs, rubs, or gallops,no edema  Respiratory:  clear to auscultation bilaterally, normal work of breathing GI: soft, nontender, nondistended, + BS MS: no deformity or atrophy Skin: warm and dry, no rash Neuro:  Strength and sensation are intact Psych: euthymic mood, full affect   Recent Labs: No results found for requested labs within last 365 days.    Lipid Panel Lab Results  Component Value Date   CHOL 152 07/15/2020   HDL 47 07/15/2020   LDLCALC 90 07/15/2020   TRIG 77 07/15/2020      Wt Readings from Last 3 Encounters:  06/30/22 263 lb 2 oz (119.4 kg)  06/29/22 261 lb (118.4 kg)  03/30/22 260 lb (117.9 kg)      ASSESSMENT AND PLAN:  Problem List Items Addressed This Visit       Cardiology Problems   Essential hypertension     Other   OSA (obstructive sleep apnea)   Renal failure   Other Visit Diagnoses     Chronic diastolic heart failure (HCC)    -  Primary   PAD (peripheral artery disease) (HCC)       Carotid artery disease, unspecified laterality, unspecified type ([place])       Hyperlipidemia LDL  goal 123456         Diastolic CHF Appears euvolemic on today's visit, recommend she continue Lasix 3 days/week, moderate fluid intake dehydrated by nephrology in December 2022 when she was taking Lasix daily  Morbid obesity We have encouraged continued exercise, careful diet management in an effort to lose weight.  Coronary artery disease with stable angina Currently with no symptoms of angina. No further workup at this time. Continue current medication regimen.  Hyperlipidemia Recommend repeat lipid panel with primary care, we will request these numbers when available Goal LDL less than 70 If numbers continue to run high, may need PCSK9 inhibitor/Repatha  COPD Weight loss recommended Stable symptoms   Total encounter time more than 30 minutes  Greater than 50% was spent in counseling and coordination of care with the patient    Signed, Esmond Plants, M.D., Ph.D. [place], [place]

## 2022-06-30 ENCOUNTER — Ambulatory Visit: Payer: 59 | Attending: Cardiovascular Disease | Admitting: Cardiovascular Disease

## 2022-06-30 ENCOUNTER — Encounter: Payer: Self-pay | Admitting: Cardiovascular Disease

## 2022-06-30 VITALS — BP 122/62 | HR 71 | Ht 66.0 in | Wt 263.1 lb

## 2022-06-30 DIAGNOSIS — I5032 Chronic diastolic (congestive) heart failure: Secondary | ICD-10-CM

## 2022-06-30 DIAGNOSIS — I779 Disorder of arteries and arterioles, unspecified: Secondary | ICD-10-CM

## 2022-06-30 DIAGNOSIS — G4733 Obstructive sleep apnea (adult) (pediatric): Secondary | ICD-10-CM

## 2022-06-30 DIAGNOSIS — E785 Hyperlipidemia, unspecified: Secondary | ICD-10-CM

## 2022-06-30 DIAGNOSIS — I739 Peripheral vascular disease, unspecified: Secondary | ICD-10-CM

## 2022-06-30 DIAGNOSIS — N183 Chronic kidney disease, stage 3 unspecified: Secondary | ICD-10-CM

## 2022-06-30 DIAGNOSIS — I10 Essential (primary) hypertension: Secondary | ICD-10-CM

## 2022-06-30 MED ORDER — HYDRALAZINE HCL 100 MG PO TABS: ORAL_TABLET | ORAL | 3 refills | Status: DC

## 2022-06-30 MED ORDER — AMLODIPINE BESYLATE 10 MG PO TABS: 10.0000 mg | ORAL_TABLET | Freq: Every day | ORAL | 3 refills | Status: DC

## 2022-06-30 MED ORDER — FUROSEMIDE 20 MG PO TABS: ORAL_TABLET | ORAL | 3 refills | Status: DC

## 2022-06-30 MED ORDER — CARVEDILOL 25 MG PO TABS: 25.0000 mg | ORAL_TABLET | Freq: Two times a day (BID) | ORAL | 3 refills | Status: DC

## 2022-06-30 MED ORDER — LOSARTAN POTASSIUM 50 MG PO TABS: 50.0000 mg | ORAL_TABLET | Freq: Every day | ORAL | 3 refills | Status: DC

## 2022-06-30 MED ORDER — FLUTICASONE PROPIONATE 50 MCG/ACT NA SUSP: 1.0000 | Freq: Every day | NASAL | 2 refills | Status: AC

## 2022-06-30 MED ORDER — CLOPIDOGREL BISULFATE 75 MG PO TABS: 75.0000 mg | ORAL_TABLET | Freq: Every day | ORAL | 3 refills | Status: DC

## 2022-06-30 MED ORDER — EZETIMIBE 10 MG PO TABS: 10.0000 mg | ORAL_TABLET | Freq: Every day | ORAL | 3 refills | Status: DC

## 2022-06-30 NOTE — Patient Instructions (Addendum)
Medication Instructions:  No changes  OTC fluticasone/flonase nasal spray for allergy  If you need a refill on your cardiac medications before your next appointment, please call your pharmacy.   Lab work: No new labs needed  Testing/Procedures: No new testing needed  Follow-Up: At [facility], you and your health needs are our priority.  As part of our continuing mission to provide you with exceptional heart care, we have created designated Provider Care Teams.  These Care Teams include your primary Cardiologist (physician) and Advanced Practice Providers (APPs -  Physician Assistants and Nurse Practitioners) who all work together to provide you with the care you need, when you need it.  You will need a follow up appointment in 12 months  Providers on your designated Care Team:   Murray Hodgkins, NP Christell Faith, PA-C Cadence Kathlen Mody, [place]  COVID-19 Vaccine Information can be found at: [email or web address] For questions related to vaccine distribution or appointments, please email [email or web address] or call 9894745447.

## 2022-07-02 LAB — VAS US ABI WITH/WO TBI
Left ABI: 0.58
Right ABI: 0.55

## 2022-07-04 ENCOUNTER — Encounter (INDEPENDENT_AMBULATORY_CARE_PROVIDER_SITE_OTHER): Payer: Self-pay | Admitting: Vascular Surgery

## 2022-07-31 ENCOUNTER — Other Ambulatory Visit: Payer: Self-pay | Admitting: Cardiovascular Disease

## 2022-07-31 DIAGNOSIS — I5032 Chronic diastolic (congestive) heart failure: Secondary | ICD-10-CM

## 2022-08-08 ENCOUNTER — Other Ambulatory Visit: Payer: Self-pay | Admitting: Cardiovascular Disease

## 2022-08-11 ENCOUNTER — Telehealth: Payer: Self-pay | Admitting: Cardiovascular Disease

## 2022-08-11 NOTE — Telephone Encounter (Signed)
   [facility] Pre-operative Risk Assessment    Request for surgical clearance:  What type of surgery is being performed?  Multiple Extractions    When is this surgery scheduled?  TBD, Victorino Dike is unsure whether extractions have been scheduled  What type of clearance is required (medical clearance vs. Pharmacy clearance to hold med vs. Both)?  Both   Are there any medications that need to be held prior to surgery and how long? Please advise on Plavix    Practice name and name of physician performing surgery?  [facility]  Victorino Dike is unsure  What is your office phone number? [phone]    7.   What is your office fax number? [phone]  8.   Anesthesia type (None, local, MAC, general)? Local    Rolly Pancake 08/11/2022, 9:22 AM   Called received from [facility] on behalf of [facility] group.

## 2022-08-12 NOTE — Telephone Encounter (Signed)
Office calling back to let Pre-Op know that the pt will have 2 teeth extracted. Please advise

## 2022-08-12 NOTE — Telephone Encounter (Signed)
    Primary Cardiologist: Julien Nordmann, MD  Chart reviewed as part of pre-operative protocol coverage. Simple dental extractions (1-2 teeth) are considered low risk procedures per guidelines and generally do not require any specific cardiac clearance. It is also generally accepted that for simple extractions and dental cleanings, there is no need to interrupt blood thinner therapy.   SBE prophylaxis is not required for the patient.  I will route this recommendation to the requesting party via Epic fax function and remove from pre-op pool.  Please call with questions.  Sharlene Dory, PA-C 08/12/2022, 11:45 AM

## 2022-09-30 ENCOUNTER — Other Ambulatory Visit (INDEPENDENT_AMBULATORY_CARE_PROVIDER_SITE_OTHER): Payer: Self-pay | Admitting: Vascular Surgery

## 2022-09-30 DIAGNOSIS — I739 Peripheral vascular disease, unspecified: Secondary | ICD-10-CM

## 2022-10-03 ENCOUNTER — Other Ambulatory Visit: Payer: Self-pay | Admitting: Cardiovascular Disease

## 2022-10-05 ENCOUNTER — Ambulatory Visit (INDEPENDENT_AMBULATORY_CARE_PROVIDER_SITE_OTHER): Payer: 59

## 2022-10-05 ENCOUNTER — Encounter (INDEPENDENT_AMBULATORY_CARE_PROVIDER_SITE_OTHER): Payer: Self-pay | Admitting: Vascular Surgery

## 2022-10-05 ENCOUNTER — Ambulatory Visit (INDEPENDENT_AMBULATORY_CARE_PROVIDER_SITE_OTHER): Payer: 59 | Admitting: Vascular Surgery

## 2022-10-05 VITALS — BP 137/78 | HR 74 | Resp 18 | Ht 66.0 in | Wt 260.8 lb

## 2022-10-05 DIAGNOSIS — I739 Peripheral vascular disease, unspecified: Secondary | ICD-10-CM | POA: Diagnosis not present

## 2022-10-05 DIAGNOSIS — I10 Essential (primary) hypertension: Secondary | ICD-10-CM

## 2022-10-05 DIAGNOSIS — M79673 Pain in unspecified foot: Secondary | ICD-10-CM | POA: Insufficient documentation

## 2022-10-05 DIAGNOSIS — I214 Non-ST elevation (NSTEMI) myocardial infarction: Secondary | ICD-10-CM | POA: Diagnosis not present

## 2022-10-05 DIAGNOSIS — I70213 Atherosclerosis of native arteries of extremities with intermittent claudication, bilateral legs: Secondary | ICD-10-CM

## 2022-10-05 DIAGNOSIS — J209 Acute bronchitis, unspecified: Secondary | ICD-10-CM

## 2022-10-05 DIAGNOSIS — M79671 Pain in right foot: Secondary | ICD-10-CM | POA: Diagnosis not present

## 2022-10-05 DIAGNOSIS — J440 Chronic obstructive pulmonary disease with acute lower respiratory infection: Secondary | ICD-10-CM

## 2022-10-05 DIAGNOSIS — Z9889 Other specified postprocedural states: Secondary | ICD-10-CM | POA: Diagnosis not present

## 2022-10-05 NOTE — Progress Notes (Signed)
MRN : [id number]  Kimberly Mccarthy is a 67 y.o. ([birth year]) female who presents with chief complaint of check circulation.  History of Present Illness:   he patient is seen for evaluation of painful lower extremities and diminished pulses.    LEFT LEG: LLE fem-PTA bypass with vein in 2015 with DCB angioplasty of the proximal anastomosis 2018. Regarding the LLE, patient has a history of abnormal duplex findings suggestive of stenosis of proximal anastomis, but she has multiple angiograms (most recent 02/2021) showing this is just a size mismatch. No intervention indicated but merits close follow up. Flow through graft today 33/10 cm/s.  RIGHT LEG: s/p RCFA [facility] angioplasty 03/06/21. ABI increased from 0.3 to 0.5 following intervention in Fall 2022; imaging today shows some continued CFA turbulence, but given patient's toe pressure is 54 mmHg and asymptomatic status, there is no further intervention indicated at this time.    Patient notes the pain is always associated with activity and is very consistent day today. Typically, the pain occurs at short distances, progress is as activity continues to the point that the patient must stop walking. Resting including standing still for several minutes allows the patient to walk a similar distance before being forced to stop again. The patient denies any recent abrupt changes in claudication symptoms.  The only interval change she is describing is a sharp heel pain on the right this is new and is intermittent.  Sometimes it occurs at night but when questioned about whether this could be consistent with rest pain she was quite adamant that it is only happening intermittently and not very frequently.   The patient denies rest pain or dangling of an extremity off the side of the bed during the night for relief. No open wounds or sores at this time. No prior interventions or  surgeries.   Interventions to date:  - 12/21/13: Left CFA and profunda endarterectomy with bovine patch angioplasty for CLI (tissue loss)  - 01/17/14: Left fem-PTA bypass with reversed GSV, CFA patch angioplasty/profundaplasty, balloon thrombectomy of the CFA, EIA, CIA, PFA for CLI (rest pain)  - 10/16/14: Right femoral endarterectomy and patch angioplasty, R iliac artery fogarty thrombectomy, left CIA and CFA bypass angioplasties for RLE CLI  - 07/25/15: Balloon angioplasty of proximal anastomosis stenosis, suction thrombectomy  - 05/07/16: DCB angioplasty of LLE bypass proximal anastomosis - 03/06/21: BLE diagnostic arteriogram finding patent LLE bypass, DCB angioplasty of RCFA    No history of back problems or DJD of the lumbar sacral spine.    The patient's blood pressure has been stable and relatively well controlled. The patient denies amaurosis fugax or recent TIA symptoms. There are no recent neurological changes noted. The patient denies history of DVT, PE or superficial thrombophlebitis. The patient denies recent episodes of angina or shortness of breath.     ABI's today Rt=0.54 and Lt=0.58 (previous ABI's Rt=0.55 and Lt=0.58)  Previous left leg arterial duplex shows a patent femoral to posterior tibial bypass graft with a 50-70% stenosis of the proximal vein bypass    No outpatient medications have been marked as taking  for the 10/05/22 encounter (Office Visit) with Gilda Crease, Latina Craver, MD.    Past Medical History:  Diagnosis Date   Anemia    AVM (arteriovenous malformation)    Bronchitis    Carotid artery disease (HCC) 10/31/2015   Chronic respiratory failure (HCC)    COPD (chronic obstructive pulmonary disease) (HCC)    Critical ischemia of lower extremity (HCC) 07/18/2014   H/O blood clots    Hyperkalemia    Hypertension    Hypothyroidism 09/05/2014   Morbid obesity (HCC)    PVD (peripheral vascular disease) (HCC)    Renal failure    Stroke [facility])    may 2015   Thyroid  disease     Past Surgical History:  Procedure Laterality Date   FEMORAL ENDARTERECTOMY Right    left foot surgery  02/2014   LEG SURGERY Left    RIGHT/LEFT HEART CATH AND CORONARY ANGIOGRAPHY N/A 02/16/2020   Procedure: RIGHT/LEFT HEART CATH AND CORONARY ANGIOGRAPHY;  Surgeon: Yvonne Kendall, MD;  Location: [facility] INVASIVE CV LAB;  Service: Cardiovascular;  Laterality: N/A;    Social History Social History   Tobacco Use   Smoking status: Never   Smokeless tobacco: Never  Vaping Use   Vaping Use: Never used  Substance Use Topics   Alcohol use: No    Alcohol/week: 0.0 standard drinks of alcohol   Drug use: No    Family History Family History  Problem Relation Age of Onset   Diabetes Mother    Hypertension Mother    Hypertension Sister    Thyroid disease Sister    Breast cancer Neg Hx     No Known Allergies   REVIEW OF SYSTEMS (Negative unless checked)  Constitutional: [] Weight loss  [] Fever  [] Chills Cardiac: [] Chest pain   [] Chest pressure   [] Palpitations   [] Shortness of breath when laying flat   [] Shortness of breath with exertion. Vascular:  [x] Pain in legs with walking   [] Pain in legs at rest  [] History of DVT   [] Phlebitis   [] Swelling in legs   [] Varicose veins   [] Non-healing ulcers Pulmonary:   [] Uses home oxygen   [] Productive cough   [] Hemoptysis   [] Wheeze  [] COPD   [] Asthma Neurologic:  [] Dizziness   [] Seizures   [] History of stroke   [] History of TIA  [] Aphasia   [] Vissual changes   [] Weakness or numbness in arm   [] Weakness or numbness in leg Musculoskeletal:   [] Joint swelling   [] Joint pain   [] Low back pain Hematologic:  [] Easy bruising  [] Easy bleeding   [] Hypercoagulable state   [] Anemic Gastrointestinal:  [] Diarrhea   [] Vomiting  [] Gastroesophageal reflux/heartburn   [] Difficulty swallowing. Genitourinary:  [] Chronic kidney disease   [] Difficult urination  [] Frequent urination   [] Blood in urine Skin:  [] Rashes   [] Ulcers  Psychological:   [] History of anxiety   []  History of major depression.  Physical Examination  Vitals:   10/05/22 1112  BP: 137/78  Pulse: 74  Resp: 18  Weight: 260 lb 12.8 oz (118.3 kg)  Height: 5\' 6"  (1.676 m)   Body mass index is 42.09 kg/m. Gen: WD/WN, NAD Head: [place]/AT, No temporalis wasting.  Ear/Nose/Throat: Hearing grossly intact, nares w/o erythema or drainage Eyes: PER, EOMI, sclera nonicteric.  Neck: Supple, no masses.  No bruit or JVD.  Pulmonary:  Good air movement, no audible wheezing, no use of accessory muscles.  Cardiac: RRR, normal S1, S2, no Murmurs. Vascular:  mild trophic changes, no open wounds Vessel Right Left  Radial Palpable Palpable  PT Not Palpable Not Palpable  DP Not Palpable Not Palpable  Gastrointestinal: soft, non-distended. No guarding/no peritoneal signs.  Musculoskeletal: M/S 5/5 throughout.  No visible deformity.  Neurologic: CN 2-12 intact. Pain and light touch intact in extremities.  Symmetrical.  Speech is fluent. Motor exam as listed above. Psychiatric: Judgment intact, Mood & affect appropriate for pt's clinical situation. Dermatologic: No rashes or ulcers noted.  No changes consistent with cellulitis.   CBC Lab Results  Component Value Date   WBC 6.0 05/01/2021   HGB 12.8 05/01/2021   HCT 42.0 05/01/2021   MCV 85.9 05/01/2021   PLT 269 05/01/2021    BMET    Component Value Date/Time   NA 143 05/01/2021 0850   NA 139 07/15/2020 1112   NA 140 04/27/2014 0907   K 4.2 05/01/2021 0850   K 3.0 (L) 04/27/2014 0907   CL 106 05/01/2021 0850   CL 104 04/27/2014 0907   CO2 26 05/01/2021 0850   CO2 26 04/27/2014 0907   GLUCOSE 95 05/01/2021 0850   GLUCOSE 111 (H) 04/27/2014 0907   BUN 21 05/01/2021 0850   BUN 19 07/15/2020 1112   BUN 7 04/27/2014 0907   CREATININE 1.47 (H) 05/01/2021 0850   CREATININE 1.00 04/27/2014 0907   CALCIUM 9.0 05/01/2021 0850   CALCIUM 8.8 04/27/2014 0907   GFRNONAA 39 (L) 05/01/2021 0850   GFRNONAA >60 04/27/2014  0907   GFRNONAA 36 (L) 11/18/2013 0926   GFRAA 37 (L) 03/05/2020 1201   GFRAA >60 04/27/2014 0907   GFRAA 42 (L) 11/18/2013 0926   CrCl cannot be calculated (Patient's most recent lab result is older than the maximum 21 days allowed.).  COAG Lab Results  Component Value Date   INR 0.99 12/02/2014    Radiology No results found.   Assessment/Plan 1. Atherosclerosis of native artery of both lower extremities with intermittent claudication (HCC)  Recommend:  The patient has evidence of atherosclerosis of the lower extremities with claudication.  The patient does not voice lifestyle limiting changes at this point in time.  Noninvasive studies do not suggest clinically significant change.  No invasive studies, angiography or surgery at this time The patient should continue walking and begin a more formal exercise program.  The patient should continue antiplatelet therapy and aggressive treatment of the lipid abnormalities  No changes in the patient's medications at this time  Continued surveillance is indicated as atherosclerosis is likely to progress with time.    The patient will continue follow up with noninvasive studies as ordered.  - VAS [place] ABI WITH/WO TBI; Future  2. Pain of right heel Given her heel pain think custom orthotics may be very helpful.  I will ask podiatry to see her and assess the situation. - Ambulatory referral to Podiatry  3. Essential hypertension Continue antihypertensive medications as already ordered, these medications have been reviewed and there are no changes at this time.  4. Non-ST elevation (NSTEMI) myocardial infarction [facility]) Continue cardiac and antihypertensive medications as already ordered and reviewed, no changes at this time.  Continue statin as ordered and reviewed, no changes at this time  Nitrates PRN for chest pain  5. COPD with acute bronchitis (HCC) Continue pulmonary medications and aerosols as already ordered, these  medications have been reviewed and there are no changes at this time.     Levora Dredge, MD  10/05/2022 11:13 AM

## 2022-10-08 LAB — VAS US ABI WITH/WO TBI
Left ABI: 0.58
Right ABI: 0.54

## 2022-10-15 ENCOUNTER — Other Ambulatory Visit: Payer: Self-pay | Admitting: Family Medicine

## 2022-10-15 DIAGNOSIS — Z1231 Encounter for screening mammogram for malignant neoplasm of breast: Secondary | ICD-10-CM

## 2022-10-16 ENCOUNTER — Other Ambulatory Visit: Payer: Self-pay | Admitting: Cardiovascular Disease

## 2022-11-02 ENCOUNTER — Telehealth (INDEPENDENT_AMBULATORY_CARE_PROVIDER_SITE_OTHER): Payer: Self-pay | Admitting: Nurse Practitioner

## 2022-11-02 NOTE — Telephone Encounter (Signed)
Patient notified with medical recommendations  

## 2022-11-02 NOTE — Telephone Encounter (Signed)
This will need to be done by her PCP.

## 2022-11-02 NOTE — Telephone Encounter (Signed)
Patient is calling wanting to know if she needs to come in for an appointment or if Dr can fill out paper work for her. She is wanting a walk in shower and hasn't been able to lift arm or shower in 2 years since having stroke. Patient is wanting to know if Dr. Gilda Crease will fill papers out for her     Please call and advise

## 2022-11-23 ENCOUNTER — Ambulatory Visit
Admission: RE | Admit: 2022-11-23 | Discharge: 2022-11-23 | Disposition: A | Discharge status: Home / Self Care | Payer: 59 | Source: Ambulatory Visit | Attending: Family Medicine | Admitting: Family Medicine

## 2022-11-23 DIAGNOSIS — Z1231 Encounter for screening mammogram for malignant neoplasm of breast: Secondary | ICD-10-CM | POA: Diagnosis present

## 2022-11-27 ENCOUNTER — Other Ambulatory Visit: Payer: Self-pay | Admitting: Cardiovascular Disease

## 2023-02-06 ENCOUNTER — Other Ambulatory Visit: Payer: Self-pay | Admitting: Cardiovascular Disease

## 2023-03-24 ENCOUNTER — Other Ambulatory Visit: Payer: Self-pay | Admitting: Cardiovascular Disease

## 2023-03-29 ENCOUNTER — Ambulatory Visit (INDEPENDENT_AMBULATORY_CARE_PROVIDER_SITE_OTHER): Payer: 59 | Admitting: Vascular Surgery

## 2023-03-29 ENCOUNTER — Ambulatory Visit (INDEPENDENT_AMBULATORY_CARE_PROVIDER_SITE_OTHER): Payer: 59

## 2023-03-29 ENCOUNTER — Encounter (INDEPENDENT_AMBULATORY_CARE_PROVIDER_SITE_OTHER): Payer: Self-pay | Admitting: Vascular Surgery

## 2023-03-29 VITALS — BP 147/73 | HR 79 | Resp 18 | Ht 66.0 in | Wt 256.2 lb

## 2023-03-29 DIAGNOSIS — I70213 Atherosclerosis of native arteries of extremities with intermittent claudication, bilateral legs: Secondary | ICD-10-CM

## 2023-03-29 DIAGNOSIS — I10 Essential (primary) hypertension: Secondary | ICD-10-CM

## 2023-03-29 DIAGNOSIS — J9621 Acute and chronic respiratory failure with hypoxia: Secondary | ICD-10-CM | POA: Diagnosis not present

## 2023-03-29 DIAGNOSIS — I2584 Coronary atherosclerosis due to calcified coronary lesion: Secondary | ICD-10-CM

## 2023-03-29 DIAGNOSIS — J9622 Acute and chronic respiratory failure with hypercapnia: Secondary | ICD-10-CM

## 2023-03-29 DIAGNOSIS — I2510 Atherosclerotic heart disease of native coronary artery without angina pectoris: Secondary | ICD-10-CM | POA: Diagnosis not present

## 2023-03-29 DIAGNOSIS — E782 Mixed hyperlipidemia: Secondary | ICD-10-CM

## 2023-03-29 NOTE — Progress Notes (Unsigned)
MRN : [id number]  Kimberly Mccarthy is a 67 y.o. (Oct 22, 1955) female who presents with chief complaint of check circulation.  History of Present Illness:   The patient returns to the office for evaluation of painful lower extremities and diminished pulses.    LEFT LEG: LLE fem-PTA bypass with vein in 2015 with DCB angioplasty of the proximal anastomosis 2018. Regarding the LLE, patient has a history of abnormal duplex findings suggestive of stenosis of proximal anastomis, but she has multiple angiograms (most recent 02/2021) showing this is just a size mismatch. No intervention indicated but merits close follow up. Flow through graft today 33/10 cm/s.  RIGHT LEG: s/p RCFA [facility] angioplasty 03/06/21. ABI increased from 0.3 to 0.5 following intervention in Fall 2022; imaging today shows some continued CFA turbulence, but given patient's toe pressure is 54 mmHg and asymptomatic status, there is no further intervention indicated at this time.    Patient notes the pain is always associated with activity and is very consistent day today. Typically, the pain occurs at short distances, progress is as activity continues to the point that the patient must stop walking. Resting including standing still for several minutes allows the patient to walk a similar distance before being forced to stop again. The patient denies any recent abrupt changes in claudication symptoms.   The only interval change she is describing is a sharp heel pain on the right this is new and is intermittent.  Sometimes it occurs at night but when questioned about whether this could be consistent with rest pain she was quite adamant that it is only happening intermittently and not very frequently.   The patient denies rest pain or dangling of an extremity off the side of the bed during the night for relief. No open wounds or sores at this time.  Interventions to  date:  - 12/21/13: Left CFA and profunda endarterectomy with bovine patch angioplasty for CLI (tissue loss)  - 01/17/14: Left fem-PTA bypass with reversed GSV, CFA patch angioplasty/profundaplasty, balloon thrombectomy of the CFA, EIA, CIA, PFA for CLI (rest pain)  - 10/16/14: Right femoral endarterectomy and patch angioplasty, R iliac artery fogarty thrombectomy, left CIA and CFA bypass angioplasties for RLE CLI  - 07/25/15: Balloon angioplasty of proximal anastomosis stenosis, suction thrombectomy  - 05/07/16: DCB angioplasty of LLE bypass proximal anastomosis - 03/06/21: BLE diagnostic arteriogram finding patent LLE bypass, DCB angioplasty of RCFA    No history of back problems or DJD of the lumbar sacral spine.    The patient's blood pressure has been stable and relatively well controlled. The patient denies amaurosis fugax or recent TIA symptoms. There are no recent neurological changes noted. The patient denies history of DVT, PE or superficial thrombophlebitis. The patient denies recent episodes of angina or shortness of breath.     ABI's today Rt=0.55 and Lt=0.58 (previous ABI's Rt=0.54 and Lt=0.58)   Previous left leg arterial duplex shows a patent femoral to posterior tibial bypass graft with a 50-70% stenosis of the proximal vein bypass    No outpatient medications have been marked as taking for the  03/29/23 encounter (Appointment) with Gilda Crease, Latina Craver, MD.    Past Medical History:  Diagnosis Date   Anemia    AVM (arteriovenous malformation)    Bronchitis    Carotid artery disease (HCC) 10/31/2015   Chronic respiratory failure (HCC)    COPD (chronic obstructive pulmonary disease) (HCC)    Critical ischemia of lower extremity (HCC) 07/18/2014   H/O blood clots    Hyperkalemia    Hypertension    Hypothyroidism 09/05/2014   Morbid obesity (HCC)    PVD (peripheral vascular disease) (HCC)    Renal failure    Stroke [facility])    may 2015   Thyroid disease     Past Surgical  History:  Procedure Laterality Date   BREAST CYST EXCISION     FEMORAL ENDARTERECTOMY Right    left foot surgery  02/25/2014   LEG SURGERY Left    RIGHT/LEFT HEART CATH AND CORONARY ANGIOGRAPHY N/A 02/16/2020   Procedure: RIGHT/LEFT HEART CATH AND CORONARY ANGIOGRAPHY;  Surgeon: Yvonne Kendall, MD;  Location: [facility] INVASIVE CV LAB;  Service: Cardiovascular;  Laterality: N/A;    Social History Social History   Tobacco Use   Smoking status: Never   Smokeless tobacco: Never  Vaping Use   Vaping status: Never Used  Substance Use Topics   Alcohol use: No    Alcohol/week: 0.0 standard drinks of alcohol   Drug use: No    Family History Family History  Problem Relation Age of Onset   Diabetes Mother    Hypertension Mother    Hypertension Sister    Thyroid disease Sister    Breast cancer Neg Hx     No Known Allergies   REVIEW OF SYSTEMS (Negative unless checked)  Constitutional: [] Weight loss  [] Fever  [] Chills Cardiac: [] Chest pain   [] Chest pressure   [] Palpitations   [] Shortness of breath when laying flat   [] Shortness of breath with exertion. Vascular:  [x] Pain in legs with walking   [] Pain in legs at rest  [] History of DVT   [] Phlebitis   [] Swelling in legs   [] Varicose veins   [] Non-healing ulcers Pulmonary:   [] Uses home oxygen   [] Productive cough   [] Hemoptysis   [] Wheeze  [] COPD   [] Asthma Neurologic:  [] Dizziness   [] Seizures   [] History of stroke   [] History of TIA  [] Aphasia   [] Vissual changes   [] Weakness or numbness in arm   [] Weakness or numbness in leg Musculoskeletal:   [] Joint swelling   [] Joint pain   [] Low back pain Hematologic:  [] Easy bruising  [] Easy bleeding   [] Hypercoagulable state   [] Anemic Gastrointestinal:  [] Diarrhea   [] Vomiting  [] Gastroesophageal reflux/heartburn   [] Difficulty swallowing. Genitourinary:  [] Chronic kidney disease   [] Difficult urination  [] Frequent urination   [] Blood in urine Skin:  [] Rashes   [] Ulcers  Psychological:   [] History of anxiety   []  History of major depression.  Physical Examination  There were no vitals filed for this visit. There is no height or weight on file to calculate BMI. Gen: WD/WN, NAD Head: [place]/AT, No temporalis wasting.  Ear/Nose/Throat: Hearing grossly intact, nares w/o erythema or drainage Eyes: PER, EOMI, sclera nonicteric.  Neck: Supple, no masses.  No bruit or JVD.  Pulmonary:  Good air movement, no audible wheezing, no use of accessory muscles.  Cardiac: RRR, normal S1, S2, no Murmurs. Vascular:  mild trophic changes, no open wounds Vessel Right Left  Radial Palpable Palpable  PT Not Palpable Not Palpable  DP Not Palpable  Not Palpable  Gastrointestinal: soft, non-distended. No guarding/no peritoneal signs.  Musculoskeletal: M/S 5/5 throughout.  No visible deformity.  Neurologic: CN 2-12 intact. Pain and light touch intact in extremities.  Symmetrical.  Speech is fluent. Motor exam as listed above. Psychiatric: Judgment intact, Mood & affect appropriate for pt's clinical situation. Dermatologic: No rashes or ulcers noted.  No changes consistent with cellulitis.   CBC Lab Results  Component Value Date   WBC 6.0 05/01/2021   HGB 12.8 05/01/2021   HCT 42.0 05/01/2021   MCV 85.9 05/01/2021   PLT 269 05/01/2021    BMET    Component Value Date/Time   NA 143 05/01/2021 0850   NA 139 07/15/2020 1112   NA 140 04/27/2014 0907   K 4.2 05/01/2021 0850   K 3.0 (L) 04/27/2014 0907   CL 106 05/01/2021 0850   CL 104 04/27/2014 0907   CO2 26 05/01/2021 0850   CO2 26 04/27/2014 0907   GLUCOSE 95 05/01/2021 0850   GLUCOSE 111 (H) 04/27/2014 0907   BUN 21 05/01/2021 0850   BUN 19 07/15/2020 1112   BUN 7 04/27/2014 0907   CREATININE 1.47 (H) 05/01/2021 0850   CREATININE 1.00 04/27/2014 0907   CALCIUM 9.0 05/01/2021 0850   CALCIUM 8.8 04/27/2014 0907   GFRNONAA 39 (L) 05/01/2021 0850   GFRNONAA >60 04/27/2014 0907   GFRNONAA 36 (L) 11/18/2013 0926   GFRAA 37 (L)  03/05/2020 1201   GFRAA >60 04/27/2014 0907   GFRAA 42 (L) 11/18/2013 0926   CrCl cannot be calculated (Patient's most recent lab result is older than the maximum 21 days allowed.).  COAG Lab Results  Component Value Date   INR 0.99 12/02/2014    Radiology No results found.   Assessment/Plan 1. Atherosclerosis of native artery of both lower extremities with intermittent claudication (HCC)  Recommend:   The patient has evidence of atherosclerosis of the lower extremities with claudication.  The patient does not voice lifestyle limiting changes at this point in time.   Noninvasive studies do not suggest clinically significant change.   No invasive studies, angiography or surgery at this time The patient should continue walking and begin a more formal exercise program.  The patient should continue antiplatelet therapy and aggressive treatment of the lipid abnormalities   No changes in the patient's medications at this time   Continued surveillance is indicated as atherosclerosis is likely to progress with time.     The patient will continue follow up with noninvasive studies as ordered.  - VAS [place] ABI WITH/WO TBI; Future  2. Essential hypertension Continue antihypertensive medications as already ordered, these medications have been reviewed and there are no changes at this time.  3. Coronary artery disease due to calcified coronary lesion Continue cardiac and antihypertensive medications as already ordered and reviewed, no changes at this time.  Continue statin as ordered and reviewed, no changes at this time  Nitrates PRN for chest pain  4. Acute on chronic respiratory failure with hypoxia and hypercapnia (HCC) Continue pulmonary medications and aerosols as already ordered, these medications have been reviewed and there are no changes at this time.   5. Mixed hyperlipidemia Continue statin as ordered and reviewed, no changes at this time    Levora Dredge,  MD  03/29/2023 9:22 AM

## 2023-03-30 ENCOUNTER — Encounter (INDEPENDENT_AMBULATORY_CARE_PROVIDER_SITE_OTHER): Payer: Self-pay | Admitting: Vascular Surgery

## 2023-04-05 ENCOUNTER — Other Ambulatory Visit: Payer: Self-pay | Admitting: Cardiovascular Disease

## 2023-04-05 DIAGNOSIS — I5032 Chronic diastolic (congestive) heart failure: Secondary | ICD-10-CM

## 2023-04-06 LAB — VAS US ABI WITH/WO TBI
Left ABI: 0.58
Right ABI: 0.55

## 2023-04-06 NOTE — Telephone Encounter (Signed)
Last visit 06/30/22 with plan to f/u in 12 months.    Next visit: 07/02/23

## 2023-06-03 ENCOUNTER — Encounter: Payer: Self-pay | Admitting: Podiatry

## 2023-06-03 ENCOUNTER — Ambulatory Visit: Payer: 59 | Admitting: Podiatry

## 2023-06-03 VITALS — Ht 66.0 in | Wt 256.2 lb

## 2023-06-03 DIAGNOSIS — M79675 Pain in left toe(s): Secondary | ICD-10-CM

## 2023-06-03 DIAGNOSIS — B351 Tinea unguium: Secondary | ICD-10-CM | POA: Diagnosis not present

## 2023-06-03 DIAGNOSIS — E1151 Type 2 diabetes mellitus with diabetic peripheral angiopathy without gangrene: Secondary | ICD-10-CM | POA: Diagnosis not present

## 2023-06-03 DIAGNOSIS — M79674 Pain in right toe(s): Secondary | ICD-10-CM | POA: Diagnosis not present

## 2023-06-03 DIAGNOSIS — E119 Type 2 diabetes mellitus without complications: Secondary | ICD-10-CM | POA: Insufficient documentation

## 2023-06-11 ENCOUNTER — Encounter: Payer: Self-pay | Admitting: Podiatry

## 2023-06-11 NOTE — Progress Notes (Signed)
 Subjective: Chief Complaint  Patient presents with   Nail Problem    Pt is here for [facility] unsure of last A1C PCP is Dr Leita and LOV was a few weeks ago.   Kimberly Mccarthy presents today for diabetic foot evaluation and painful elongated mycotic toenails 1-5 bilaterally which are tender when wearing enclosed shoe gear. Pain is relieved with periodic professional debridement..  Patient denies any h/o foot wounds.  Patient has been diagnosed with neuropathy.  PCP is [facility].  Past Medical History:  Diagnosis Date   Anemia    AVM (arteriovenous malformation)    Bronchitis    Carotid artery disease (HCC) 10/31/2015   Chronic respiratory failure (HCC)    COPD (chronic obstructive pulmonary disease) (HCC)    Critical ischemia of lower extremity (HCC) 07/18/2014   H/O blood clots    Hyperkalemia    Hypertension    Hypothyroidism 09/05/2014   Morbid obesity (HCC)    PVD (peripheral vascular disease) (HCC)    Renal failure    Stroke [facility])    may 2015   Thyroid disease     Patient Active Problem List   Diagnosis Date Noted   Heel pain 10/05/2022   Anemia 06/21/2020   Mixed hyperlipidemia 06/21/2020   (HFpEF) heart failure with preserved ejection fraction (HCC) 02/17/2020   Coronary artery disease due to calcified coronary lesion 02/17/2020   History of tobacco abuse 02/17/2020   Obesity 02/17/2020   OSA (obstructive sleep apnea) 02/17/2020   Non-ST elevation (NSTEMI) myocardial infarction [facility])    Elevated brain natriuretic peptide (BNP) level    Renal failure 02/12/2020   Chronic kidney disease, stage 3b (HCC) 12/01/2019   Acute on chronic respiratory failure (HCC) 12/01/2019   COPD with acute bronchitis (HCC) 12/01/2019   COPD exacerbation (HCC) 12/01/2019   Acute respiratory failure with hypoxia and hypercapnia (HCC) 12/01/2019   Acute on chronic respiratory failure with hypoxia and hypercapnia (HCC)    Other chronic pain    Low back pain  09/09/2017   Stroke (HCC) 10/31/2015   Pain in both lower legs 08/30/2015   CRPS (complex regional pain syndrome) type I of lower limb 08/30/2015   Atherosclerosis of native arteries of extremity with intermittent claudication (HCC) 01/03/2015   Moderate tricuspid insufficiency 01/03/2015   Chest pain 12/04/2014   Acute gastroenteritis 12/04/2014   Elevated liver enzymes    Abdominal pain, epigastric    HTN (hypertension), malignant 12/02/2014   Accelerated hypertension 09/05/2014   Essential hypertension 09/05/2014   Gastritis without bleeding 09/05/2014   Elevated troponin 09/05/2014   AKI (acute kidney injury) (HCC) 09/05/2014   Hypothyroidism 09/05/2014   Critical ischemia of lower extremity (HCC) 07/18/2014   Hyperkalemia 02/03/2014   Disorder of arteries and arterioles (HCC) 01/11/2014   Acid reflux 11/23/2013   Cerebrovascular accident, old 11/23/2013   Chronic kidney disease 08/02/2013    Past Surgical History:  Procedure Laterality Date   BREAST CYST EXCISION     FEMORAL ENDARTERECTOMY Right    left foot surgery  02/25/2014   LEG SURGERY Left    RIGHT/LEFT HEART CATH AND CORONARY ANGIOGRAPHY N/A 02/16/2020   Procedure: RIGHT/LEFT HEART CATH AND CORONARY ANGIOGRAPHY;  Surgeon: Mady Bruckner, MD;  Location: [facility] INVASIVE CV LAB;  Service: Cardiovascular;  Laterality: N/A;    Current Outpatient Medications on File Prior to Visit  Medication Sig Dispense Refill   amLODipine  (NORVASC ) 10 MG tablet Take 1 tablet (10 mg total) by mouth daily. 90  tablet 3   aspirin  EC (ASPIRIN  LOW DOSE) 81 MG tablet TAKE 1 TABLET BY MOUTH DAILY FOR CVA 90 tablet 1   atorvastatin  (LIPITOR) 80 MG tablet TAKE 1 TABLET BY MOUTH DAILY 90 tablet 3   carvedilol  (COREG ) 25 MG tablet Take 1 tablet (25 mg total) by mouth 2 (two) times daily with a meal. 180 tablet 3   clopidogrel  (PLAVIX ) 75 MG tablet Take 1 tablet (75 mg total) by mouth daily. 90 tablet 3   cyclobenzaprine  (FLEXERIL ) 10 MG tablet  Take 1 tablet by mouth at bedtime.     empagliflozin  (JARDIANCE ) 10 MG TABS tablet TAKE 1 TABLET BY MOUTH DAILY 90 tablet 0   ezetimibe  (ZETIA ) 10 MG tablet Take 1 tablet (10 mg total) by mouth daily. 90 tablet 3   famotidine  (PEPCID ) 20 MG tablet TAKE 1 TABLET BY MOUTH TWICE DAILY RELATED TO GERD 180 tablet 3   FEROSUL 325 (65 Fe) MG tablet TAKE 1 TABLET BY MOUTH ONE TIME A DAY EVERY MONDAY, WEDNESDAY, AND FRIDAY FOR IRON SUPPLEMENT 12 tablet 6   fluticasone  (FLONASE ) 50 MCG/ACT nasal spray Place 1 spray into both nostrils daily. 9.9 mL 2   furosemide  (LASIX ) 20 MG tablet TAKE 1 TABLET BY MOUTH ON MONDAY, WEDNESDAY AND FRIDAY AND AS NEEDED FOR SWELLING 12 tablet 3   gabapentin  (NEURONTIN ) 300 MG capsule TAKE 1 CAPSULE BY MOUTH 3 TIMES A DAY FOR PAIN. DO NOT CRUSH 90 capsule 3   hydrALAZINE  (APRESOLINE ) 100 MG tablet TAKE 1 TABLET BY MOUTH IN THE MORNING, AT NOON AND AT BEDTIME 270 tablet 3   levothyroxine  (SYNTHROID ) 50 MCG tablet Take 50 mcg by mouth daily before breakfast.     loperamide  (IMODIUM  A-D) 2 MG tablet Take 1 tablet (2 mg total) by mouth 4 (four) times daily as needed for diarrhea or loose stools. 30 tablet 0   losartan  (COZAAR ) 50 MG tablet Take 1 tablet (50 mg total) by mouth daily. 90 tablet 3   Multiple Vitamin (MULTIVITAMIN ADULT PO) Take by mouth daily.     oxyCODONE  (OXYCONTIN ) 10 mg 12 hr tablet Take 10 mg by mouth every 12 (twelve) hours.      pantoprazole  (PROTONIX ) 40 MG tablet Take 40 mg by mouth 2 (two) times daily.     polyethylene glycol (MIRALAX  / GLYCOLAX ) 17 g packet Take 17 g by mouth daily. 14 each 0   ranitidine  50 mg in sodium chloride  0.9 % 50 mL Take by mouth.     senna (SENOKOT) 8.6 MG tablet Take 1 tablet by mouth daily.     VICTOZA 18 MG/3ML SOPN Inject into the skin.     Vitamin D, Ergocalciferol, (DRISDOL) 1.25 MG (50000 UNIT) CAPS capsule Take 50,000 Units by mouth every 7 (seven) days.     WEGOVY 1 MG/0.5ML SOAJ Inject 1 mg into the skin.     No  current facility-administered medications on file prior to visit.     No Known Allergies  Social History   Occupational History   Not on file  Tobacco Use   Smoking status: Never   Smokeless tobacco: Never  Vaping Use   Vaping status: Never Used  Substance and Sexual Activity   Alcohol use: No    Alcohol/week: 0.0 standard drinks of alcohol   Drug use: No   Sexual activity: Never    Family History  Problem Relation Age of Onset   Diabetes Mother    Hypertension Mother    Hypertension Sister  Thyroid disease Sister    Breast cancer Neg Hx     Immunization History  Administered Date(s) Administered   Influenza,inj,Quad PF,6+ Mos 02/22/2020   Influenza-Unspecified 03/23/2016   Moderna Sars-Covid-2 Vaccination 09/01/2019, 09/15/2019   Pneumococcal Polysaccharide-23 12/03/2019    Objective: There were no vitals filed for this visit.  Kimberly Mccarthy is a pleasant 68 y.o. female obese in NAD. AAO X 3.   Title   Diabetic Foot Exam - detailed Date & Time: 06/03/2023 10:45 AM Diabetic Foot exam was performed with the following findings: Yes  Visual Foot Exam completed.: Yes  Is there a history of foot ulcer?: No Is there a foot ulcer now?: No Is there swelling?: No Is there elevated skin temperature?: No Is there abnormal foot shape?: No Is there a claw toe deformity?: No Are the toenails long?: Yes Are the toenails thick?: Yes Are the toenails ingrown?: Yes Is the skin thin, fragile, shiny and hairless?: No Normal Range of Motion?: Yes Is there foot or ankle muscle weakness?: No Do you have pain in calf while walking?: No Are the shoes appropriate in style and fit?: Yes Can the patient see the bottom of their feet?: No Pulse Foot Exam completed.: Yes   Right Posterior Tibialis: Absent Left posterior Tibialis: Absent   Right Dorsalis Pedis: Diminished Left Dorsalis Pedis: Diminished     Sensory Foot Exam Completed.: Yes Semmes-Weinstein Monofilament  Test + means has sensation and - means no sensation  R Foot Test Control: Pos L Foot Test Control: Pos   R Site 1-Great Toe: Pos L Site 1-Great Toe: Pos   R Site 4: Pos L Site 4: Pos   R site 5: Pos L Site 5: Pos  R Site 6: Pos L Site 6: Pos     Image components are not supported.   Image components are not supported. Image components are not supported.  Tuning [place] Right vibratory: diminished Left vibratory: diminished  Comments      Lab Results  Component Value Date   HGBA1C 6.1 (H) 02/12/2020   Assessment: 1. Pain due to onychomycosis of toenails of both feet   2. Type II diabetes mellitus with peripheral circulatory disorder (HCC)   3. Encounter for diabetic foot exam (HCC)      ADA Risk Categorization: High Risk:  Patient has one or more of the following: Loss of protective sensation Absent pedal pulses Severe Foot deformity History of foot ulcer  Plan: Diabetic foot examination performed today. All patient's and/or POA's questions/concerns addressed on today's visit. Toenails 1-5 debrided in length and girth without incident. Continue foot and shoe inspections daily. Monitor blood glucose per PCP/Endocrinologist's recommendations. Continue soft, supportive shoe gear daily. Report any pedal injuries to medical professional. Call office if there are any questions/concerns. -Patient/POA to call should there be question/concern in the interim.  Return in about 3 months (around 08/31/2023).  Delon LITTIE Merlin, DPM      [facility] LOCATION: [address], [place] [address]                   Office [phone]   [facility] LOCATION: [address] [place], [place] [address] Office [phone]

## 2023-06-22 ENCOUNTER — Ambulatory Visit (INDEPENDENT_AMBULATORY_CARE_PROVIDER_SITE_OTHER): Payer: 59 | Admitting: Podiatry

## 2023-06-22 ENCOUNTER — Ambulatory Visit (INDEPENDENT_AMBULATORY_CARE_PROVIDER_SITE_OTHER): Payer: 59

## 2023-06-22 ENCOUNTER — Encounter: Payer: Self-pay | Admitting: Podiatry

## 2023-06-22 DIAGNOSIS — M2141 Flat foot [pes planus] (acquired), right foot: Secondary | ICD-10-CM | POA: Diagnosis not present

## 2023-06-22 DIAGNOSIS — M722 Plantar fascial fibromatosis: Secondary | ICD-10-CM

## 2023-06-22 DIAGNOSIS — M2142 Flat foot [pes planus] (acquired), left foot: Secondary | ICD-10-CM | POA: Diagnosis not present

## 2023-06-22 MED ORDER — BETAMETHASONE SOD PHOS & ACET 6 (3-3) MG/ML IJ SUSP
3.0000 mg | Freq: Once | INTRAMUSCULAR | Status: AC
  Administered 2023-06-22: 3 mg via INTRA_ARTICULAR

## 2023-06-22 NOTE — Progress Notes (Signed)
   Chief Complaint  Patient presents with   Foot Pain    "It's my right heel.  Sometime it hurts me so bad." N - heel pain L - plantar heel right D - 1 year O - off and on C - sharp pain, wakes me up out of my sleep A - walking, barefoot T - Oxycodone doesn't give relief    Subjective: 68 y.o. female presenting today for evaluation of right heel pain ongoing for about a year.  Idiopathic gradual onset.  She does have a history of stroke which is altered her gait.  She has not been anything for treatment   Past Medical History:  Diagnosis Date   Anemia    AVM (arteriovenous malformation)    Bronchitis    Carotid artery disease (HCC) 10/31/2015   Chronic respiratory failure (HCC)    COPD (chronic obstructive pulmonary disease) (HCC)    Critical ischemia of lower extremity (HCC) 07/18/2014   H/O blood clots    Hyperkalemia    Hypertension    Hypothyroidism 09/05/2014   Morbid obesity (HCC)    PVD (peripheral vascular disease) (HCC)    Renal failure    Stroke [facility])    may 2015   Thyroid disease      Objective: Physical Exam General: The patient is alert and oriented x3 in no acute distress.  Dermatology: Skin is warm, dry and supple bilateral lower extremities. Negative for open lesions or macerations bilateral.   Vascular: Dorsalis Pedis and Posterior Tibial pulses palpable bilateral.  Capillary fill time is immediate to all digits.  Neurological: Grossly intact via light touch  Musculoskeletal: Tenderness to palpation to the plantar aspect of the right heel along the plantar fascia. All other joints range of motion within normal limits bilateral. Strength 5/5 in all groups bilateral.  Collapse of the medial longitudinal arch also noted with weightbearing.  Radiographic exam RT foot 06/22/2023: Normal osseous mineralization. Joint spaces preserved. No fracture/dislocation/boney destruction. No other soft tissue abnormalities or radiopaque foreign bodies.  Collapse of the  medial longitudinal arch of the foot noted on lateral view consistent with pes planus deformity  Assessment: 1. Plantar fasciitis right 2.  Pes planovalgus right  Plan of Care:  -Patient evaluated. Xrays reviewed.   -Injection of 0.5cc Celestone soluspan injected into the right plantar fascia  -Stressed the importance of wearing good supportive shoes that provide support and cushion even around the house.  Advised against going barefoot -No prescriptions provided.  Patient on anticoagulant.  OTC Tylenol as needed -Return to clinic next scheduled appointment for routine footcare with Dr. Vallery Sa, DPM [facility]  Dr. Felecia Shelling, DPM    [address] [place], [place] [address]                Office [phone]  Fax [phone]

## 2023-06-26 ENCOUNTER — Other Ambulatory Visit: Payer: Self-pay | Admitting: Cardiovascular Disease

## 2023-06-26 DIAGNOSIS — I5032 Chronic diastolic (congestive) heart failure: Secondary | ICD-10-CM

## 2023-06-28 ENCOUNTER — Encounter: Payer: Self-pay | Admitting: *Deleted

## 2023-07-02 ENCOUNTER — Ambulatory Visit: Payer: 59 | Attending: Cardiovascular Disease | Admitting: Cardiovascular Disease

## 2023-07-02 ENCOUNTER — Encounter: Payer: Self-pay | Admitting: Cardiovascular Disease

## 2023-07-02 VITALS — BP 144/76 | HR 69 | Ht 67.0 in | Wt 260.4 lb

## 2023-07-02 DIAGNOSIS — I25118 Atherosclerotic heart disease of native coronary artery with other forms of angina pectoris: Secondary | ICD-10-CM

## 2023-07-02 DIAGNOSIS — I739 Peripheral vascular disease, unspecified: Secondary | ICD-10-CM | POA: Diagnosis not present

## 2023-07-02 DIAGNOSIS — G4733 Obstructive sleep apnea (adult) (pediatric): Secondary | ICD-10-CM

## 2023-07-02 DIAGNOSIS — N183 Chronic kidney disease, stage 3 unspecified: Secondary | ICD-10-CM

## 2023-07-02 DIAGNOSIS — I779 Disorder of arteries and arterioles, unspecified: Secondary | ICD-10-CM

## 2023-07-02 DIAGNOSIS — I10 Essential (primary) hypertension: Secondary | ICD-10-CM

## 2023-07-02 DIAGNOSIS — I5032 Chronic diastolic (congestive) heart failure: Secondary | ICD-10-CM | POA: Diagnosis not present

## 2023-07-02 DIAGNOSIS — E785 Hyperlipidemia, unspecified: Secondary | ICD-10-CM

## 2023-07-02 MED ORDER — AMLODIPINE BESYLATE 10 MG PO TABS: 10.0000 mg | ORAL_TABLET | Freq: Every day | ORAL | 3 refills | Status: AC

## 2023-07-02 MED ORDER — LOSARTAN POTASSIUM 50 MG PO TABS: 50.0000 mg | ORAL_TABLET | Freq: Every day | ORAL | 3 refills | Status: AC

## 2023-07-02 MED ORDER — ATORVASTATIN CALCIUM 80 MG PO TABS: 80.0000 mg | ORAL_TABLET | Freq: Every day | ORAL | 3 refills | Status: AC

## 2023-07-02 MED ORDER — CARVEDILOL 25 MG PO TABS: 25.0000 mg | ORAL_TABLET | Freq: Two times a day (BID) | ORAL | 3 refills | Status: AC

## 2023-07-02 MED ORDER — CLOPIDOGREL BISULFATE 75 MG PO TABS: 75.0000 mg | ORAL_TABLET | Freq: Every day | ORAL | 3 refills | Status: AC

## 2023-07-02 MED ORDER — EZETIMIBE 10 MG PO TABS: 10.0000 mg | ORAL_TABLET | Freq: Every day | ORAL | 3 refills | Status: AC

## 2023-07-02 MED ORDER — EMPAGLIFLOZIN 10 MG PO TABS: 10.0000 mg | ORAL_TABLET | Freq: Every day | ORAL | 3 refills | Status: AC

## 2023-07-02 MED ORDER — FUROSEMIDE 20 MG PO TABS: ORAL_TABLET | ORAL | 3 refills | Status: AC

## 2023-07-02 MED ORDER — HYDRALAZINE HCL 100 MG PO TABS: ORAL_TABLET | ORAL | 3 refills | Status: AC

## 2023-07-02 NOTE — Progress Notes (Signed)
 Cardiology Office Note  Date:  07/02/2023   ID:  Kimberly Mccarthy, DOB 09/14/1955, MRN [id number]  PCP:  [facility]   Chief Complaint  Patient presents with   12 month follow up     Patient c/o shortness of breath with over exertion.    HPI:  Kimberly Mccarthy is a 68 y.o. female with a hx of  Hx of smoking, stopped 2016, COPD on QHS Bipap,  CAD by cath 01/2020 ,  medical therapy  HTN,  chronic pain,  hypothyroidism,  CKD, stage 3a PVD, followed at [facility] s/p RLE femoral endarterectomy with iliac artery thrombectomy 09/2014 and s/p LLE fem-PTA bypass with vein 12/2013 requiring angioplasty x2 for re-stenosis (last 04/2016). CVA,  diastolic heart failure,  Morbid obesity who presents for follow-up of her coronary artery disease, cabg  LOV 3/24  In follow-up today she reports feeling well Walks with a walker, no regular exercise program Stable leg swelling trace on the left minimal on the right Takes Lasix 3 days a week, rarely takes extra  Mild chronic shortness of breath on exertion  Followed by vascular, severe stable lower extremity arterial disease, denies claudication symptoms  Reports compliance with her Lipitor 80 daily with Zetia 10 daily Lipids followed by primary care  Prior history of right CFA stenosis , drug coated balloon angioplasty  Patent LLE fem-PTA bypass.   EKG personally reviewed by myself on todays visit EKG Interpretation Date/Time:  Friday July 02 2023 10:47:12 EST Ventricular Rate:  69 PR Interval:  148 QRS Duration:  82 QT Interval:  422 QTC Calculation: 452 R Axis:   17  Text Interpretation: Normal sinus rhythm Normal ECG When compared with ECG of 14-Feb-2020 05:17, No significant change was found Confirmed by Julien Nordmann [phone]) on 07/02/2023 10:58:46 AM    Other past medical history reviewed [facility]  October 2021 was discharged on IV Lasix, IV labetalol, obviously a mistake in her discharge medications  [facility]  02/12/2020 via EMS with acute on chronic hypoxic respiratory failure due to COPD and diastolic heart failure  intubated on admission.    Echo at that time showed EF 50 to 55%, RV normal size and function, pericardial effusion anterior to the right ventricle, mild to moderate AS without stenosis.   Right and left heart cath showed severe two-vessel CAD with diffuse heavy calcification.  Sequential 90% proximal, 50% mid, 60% distal LAD stenosis as well as CTO at the ostium of codominant RCA.  Left circumflex with moderate disease that was diffuse.  She had severely elevated left heart and right heart pulmonary pressures.    transferred to [facility] for complex PCI versus CABG.   At [facility] her disease burden was determined to be appropriate for medical management and started on intensive medical therapy.    PMH:   has a past medical history of Anemia, AVM (arteriovenous malformation), Bronchitis, Carotid artery disease (HCC) (10/31/2015), Chronic respiratory failure (HCC), COPD (chronic obstructive pulmonary disease) (HCC), Critical ischemia of lower extremity (HCC) (07/18/2014), H/O blood clots, Hyperkalemia, Hypertension, Hypothyroidism (09/05/2014), Morbid obesity (HCC), PVD (peripheral vascular disease) (HCC), Renal failure, Stroke (HCC), and Thyroid disease.  PSH:    Past Surgical History:  Procedure Laterality Date   BREAST CYST EXCISION     FEMORAL ENDARTERECTOMY Right    left foot surgery  02/25/2014   LEG SURGERY Left    RIGHT/LEFT HEART CATH AND CORONARY ANGIOGRAPHY N/A 02/16/2020   Procedure: RIGHT/LEFT HEART CATH AND CORONARY ANGIOGRAPHY;  Surgeon: End,  Cristal Deer, MD;  Location: [facility] INVASIVE CV LAB;  Service: Cardiovascular;  Laterality: N/A;    Current Outpatient Medications  Medication Sig Dispense Refill   amLODipine (NORVASC) 10 MG tablet TAKE 1 TABLET BY MOUTH DAILY 90 tablet 3   aspirin EC (ASPIRIN LOW DOSE) 81 MG tablet TAKE 1 TABLET BY MOUTH DAILY FOR CVA 90 tablet 1   atorvastatin  (LIPITOR) 80 MG tablet TAKE 1 TABLET BY MOUTH DAILY 90 tablet 3   carvedilol (COREG) 25 MG tablet Take 1 tablet (25 mg total) by mouth 2 (two) times daily with a meal. 180 tablet 3   clopidogrel (PLAVIX) 75 MG tablet Take 1 tablet (75 mg total) by mouth daily. 90 tablet 3   cyclobenzaprine (FLEXERIL) 10 MG tablet Take 1 tablet by mouth at bedtime.     empagliflozin (JARDIANCE) 10 MG TABS tablet TAKE 1 TABLET BY MOUTH DAILY 90 tablet 3   ezetimibe (ZETIA) 10 MG tablet Take 1 tablet (10 mg total) by mouth daily. 90 tablet 3   famotidine (PEPCID) 20 MG tablet TAKE 1 TABLET BY MOUTH TWICE DAILY RELATED TO GERD 180 tablet 3   FEROSUL 325 (65 Fe) MG tablet TAKE 1 TABLET BY MOUTH ONE TIME A DAY EVERY MONDAY, WEDNESDAY, AND FRIDAY FOR IRON SUPPLEMENT 12 tablet 6   fluticasone (FLONASE) 50 MCG/ACT nasal spray Place 1 spray into both nostrils daily. 9.9 mL 2   furosemide (LASIX) 20 MG tablet TAKE 1 TABLET BY MOUTH ON MONDAY, WEDNESDAY AND FRIDAY AND AS NEEDED FOR SWELLING 12 tablet 3   gabapentin (NEURONTIN) 300 MG capsule TAKE 1 CAPSULE BY MOUTH 3 TIMES A DAY FOR PAIN. DO NOT CRUSH 90 capsule 3   hydrALAZINE (APRESOLINE) 100 MG tablet TAKE 1 TABLET BY MOUTH IN THE MORNING, AT NOON AND AT BEDTIME 270 tablet 3   levothyroxine (SYNTHROID) 50 MCG tablet Take 50 mcg by mouth daily before breakfast.     loperamide (IMODIUM A-D) 2 MG tablet Take 1 tablet (2 mg total) by mouth 4 (four) times daily as needed for diarrhea or loose stools. 30 tablet 0   losartan (COZAAR) 50 MG tablet Take 1 tablet (50 mg total) by mouth daily. 90 tablet 3   oxyCODONE (OXYCONTIN) 10 mg 12 hr tablet Take 10 mg by mouth every 12 (twelve) hours.      pantoprazole (PROTONIX) 40 MG tablet Take 40 mg by mouth 2 (two) times daily.     polyethylene glycol (MIRALAX / GLYCOLAX) 17 g packet Take 17 g by mouth daily. 14 each 0   ranitidine 50 mg in sodium chloride 0.9 % 50 mL Take by mouth.     senna (SENOKOT) 8.6 MG tablet Take 1 tablet by mouth  daily.     VICTOZA 18 MG/3ML SOPN Inject into the skin.     WEGOVY 1 MG/0.5ML SOAJ Inject 1 mg into the skin.     No current facility-administered medications for this visit.    Allergies:   Patient has no known allergies.   Social History:  The patient  reports that she has never smoked. She has never used smokeless tobacco. She reports that she does not drink alcohol and does not use drugs.   Family History:   family history includes Diabetes in her mother; Hypertension in her mother and sister; Thyroid disease in her sister.    Review of Systems: Review of Systems  Constitutional: Negative.   HENT: Negative.    Respiratory: Negative.    Cardiovascular: Negative.  Gastrointestinal: Negative.   Musculoskeletal:  Positive for joint pain.  Neurological: Negative.   Psychiatric/Behavioral: Negative.    All other systems reviewed and are negative.   PHYSICAL EXAM: VS:  BP (!) 144/76 (BP Location: Left Arm, Patient Position: Sitting, Cuff Size: Normal)   Pulse 69   Ht 5\' 7"  (1.702 m)   Wt 260 lb 6 oz (118.1 kg)   SpO2 95%   BMI 40.78 kg/m  , BMI Body mass index is 40.78 kg/m. GEN: Well nourished, well developed, in no acute distress HEENT: normal Neck: no JVD, carotid bruits, or masses Cardiac: RRR; no murmurs, rubs, or gallops,no edema  Respiratory:  clear to auscultation bilaterally, normal work of breathing GI: soft, nontender, nondistended, + BS MS: no deformity or atrophy Skin: warm and dry, no rash Neuro:  Strength and sensation are intact Psych: euthymic mood, full affect  Recent Labs: No results found for requested labs within last 365 days.    Lipid Panel Lab Results  Component Value Date   CHOL 152 07/15/2020   HDL 47 07/15/2020   LDLCALC 90 07/15/2020   TRIG 77 07/15/2020      Wt Readings from Last 3 Encounters:  07/02/23 260 lb 6 oz (118.1 kg)  06/03/23 256 lb 3.2 oz (116.2 kg)  03/29/23 256 lb 3.2 oz (116.2 kg)      ASSESSMENT AND  PLAN:  Problem List Items Addressed This Visit       Cardiology Problems   Essential hypertension   Relevant Orders   EKG 12-Lead (Completed)   Coronary artery disease due to calcified coronary lesion - Primary     Other   OSA (obstructive sleep apnea)   Renal failure   Other Visit Diagnoses       Chronic diastolic heart failure (HCC)       Relevant Orders   EKG 12-Lead (Completed)     PAD (peripheral artery disease) (HCC)       Relevant Orders   EKG 12-Lead (Completed)     Carotid artery disease, unspecified laterality, unspecified type (HCC)       Relevant Orders   EKG 12-Lead (Completed)     Hyperlipidemia LDL goal <70          Diastolic CHF Appears relatively euvolemic Recommend she continue Lasix 3 days a week, extra dosing if needed Prior history hypovolemia/dehydration on taking Lasix daily  Morbid obesity We have encouraged continued exercise, careful diet management   Coronary artery disease with stable angina Currently with no symptoms of angina. No further workup at this time. Continue current medication regimen.  Hyperlipidemia Recommend she continue Lipitor 80 daily with Zetia 10 daily  COPD Long prior smoking history  Diabetes type 2 Low carbohydrate diet recommended Managed by primary care   Signed, Dossie Arbour, M.D., Ph.D. [facility] [place], [place] [phone]

## 2023-07-02 NOTE — Patient Instructions (Signed)

## 2023-07-27 ENCOUNTER — Other Ambulatory Visit: Payer: Self-pay | Admitting: Cardiovascular Disease

## 2023-09-02 ENCOUNTER — Ambulatory Visit (INDEPENDENT_AMBULATORY_CARE_PROVIDER_SITE_OTHER): Payer: 59 | Admitting: Podiatry

## 2023-09-02 ENCOUNTER — Encounter: Payer: Self-pay | Admitting: Podiatry

## 2023-09-02 DIAGNOSIS — M79675 Pain in left toe(s): Secondary | ICD-10-CM | POA: Diagnosis not present

## 2023-09-02 DIAGNOSIS — M79674 Pain in right toe(s): Secondary | ICD-10-CM

## 2023-09-02 DIAGNOSIS — B351 Tinea unguium: Secondary | ICD-10-CM | POA: Diagnosis not present

## 2023-09-02 DIAGNOSIS — E1151 Type 2 diabetes mellitus with diabetic peripheral angiopathy without gangrene: Secondary | ICD-10-CM | POA: Diagnosis not present

## 2023-09-06 ENCOUNTER — Encounter: Payer: Self-pay | Admitting: Podiatry

## 2023-09-06 NOTE — Progress Notes (Signed)
  Subjective:  Patient ID: Kimberly Mccarthy, female    DOB: 05/27/1955,  MRN: [id number]  Kimberly Mccarthy presents to clinic today for at risk foot care. Pt has h/o NIDDM with PAD and painful elongated mycotic toenails 1-5 bilaterally which are tender when wearing enclosed shoe gear. Pain is relieved with periodic professional debridement.  Chief Complaint  Patient presents with   Nail Problem    "Toenails"   New problem(s): None.   PCP is [facility].  No Known Allergies  Review of Systems: Negative except as noted in the HPI.  Objective: No changes noted in today's physical examination. There were no vitals filed for this visit. Kimberly Mccarthy is a pleasant 68 y.o. female obese in NAD. AAO x 3.  Vascular Examination: CFT <3 seconds b/l. DP pulses faintly palpable b/l. PT pulses nonpalpable b/l. Digital hair absent. Skin temperature gradient warm to warm b/l. No pain with calf compression. No ischemia or gangrene. No cyanosis or clubbing noted b/l. No edema noted b/l LE.   Neurological Examination: Sensation grossly intact b/l with 10 gram monofilament. Vibratory sensation diminished b/l.  Dermatological Examination: Pedal skin warm and supple b/l. No open wounds b/l. No interdigital macerations. Toenails 1-5 b/l thick, discolored, elongated with subungual debris and pain on dorsal palpation.  No hyperkeratotic nor porokeratotic lesions present on today's visit.  Musculoskeletal Examination: Muscle strength 5/5 to all lower extremity muscle groups bilaterally. No pain, crepitus or joint limitation noted with ROM bilateral LE. No gross bony deformities bilaterally. Utilizes rollator for ambulation assistance.  Radiographs: None  Assessment/Plan: 1. Pain due to onychomycosis of toenails of both feet   2. Type II diabetes mellitus with peripheral circulatory disorder [facility])     Patient was evaluated and treated. All patient's and/or POA's questions/concerns  addressed on today's visit. Mycotic toenails 1-5 debrided in length and girth without incident.  Continue daily foot inspections and monitor blood glucose per PCP/Endocrinologist's recommendations.Continue soft, supportive shoe gear daily. Report any pedal injuries to medical professional. Call office if there are any quesitons/concerns. -Patient/POA to call should there be question/concern in the interim.   Return in about 3 months (around 12/03/2023).  Kimberly Mccarthy, DPM      [place] LOCATION: [address], [place] [address]                   Office [phone]   [facility] LOCATION: [address] [place], [place] [address] Office [phone]

## 2023-09-22 ENCOUNTER — Other Ambulatory Visit: Payer: Self-pay | Admitting: Cardiovascular Disease

## 2023-09-25 NOTE — Progress Notes (Unsigned)
 MRN : [id number]  Kimberly Mccarthy is a 68 y.o. (1955-05-15) female who presents with chief complaint of check circulation.  History of Present Illness:   The patient returns to the office for evaluation of painful lower extremities and diminished pulses.    LEFT LEG: LLE fem-PTA bypass with vein in 2015 with DCB angioplasty of the proximal anastomosis 2018. Regarding the LLE, patient has a history of abnormal duplex findings suggestive of stenosis of proximal anastomis, but she has multiple angiograms (most recent 02/2021) showing this is just a size mismatch. No intervention indicated but merits close follow up. Flow through graft today 33/10 cm/s.  RIGHT LEG: s/p RCFA [facility] angioplasty 03/06/21. ABI increased from 0.3 to 0.5 following intervention in Fall 2022; imaging today shows some continued CFA turbulence, but given patient's toe pressure is 54 mmHg and asymptomatic status, there is no further intervention indicated at this time.    Patient notes the pain is always associated with activity and is very consistent day today. Typically, the pain occurs at short distances, progress is as activity continues to the point that the patient must stop walking. Resting including standing still for several minutes allows the patient to walk a similar distance before being forced to stop again. The patient denies any recent abrupt changes in claudication symptoms.   The only interval change she is describing is a sharp heel pain on the right this is new and is intermittent.  Sometimes it occurs at night but when questioned about whether this could be consistent with rest pain she was quite adamant that it is only happening intermittently and not very frequently.   The patient denies rest pain or dangling of an extremity off the side of the bed during the night for relief. No open wounds or sores at this time.   Interventions to  date:  - 12/21/13: Left CFA and profunda endarterectomy with bovine patch angioplasty for CLI (tissue loss)  - 01/17/14: Left fem-PTA bypass with reversed GSV, CFA patch angioplasty/profundaplasty, balloon thrombectomy of the CFA, EIA, CIA, PFA for CLI (rest pain)  - 10/16/14: Right femoral endarterectomy and patch angioplasty, R iliac artery fogarty thrombectomy, left CIA and CFA bypass angioplasties for RLE CLI  - 07/25/15: Balloon angioplasty of proximal anastomosis stenosis, suction thrombectomy  - 05/07/16: DCB angioplasty of LLE bypass proximal anastomosis - 03/06/21: BLE diagnostic arteriogram finding patent LLE bypass, DCB angioplasty of RCFA    No history of back problems or DJD of the lumbar sacral spine.    The patient's blood pressure has been stable and relatively well controlled. The patient denies amaurosis fugax or recent TIA symptoms. There are no recent neurological changes noted. The patient denies history of DVT, PE or superficial thrombophlebitis. The patient denies recent episodes of angina or shortness of breath.     ABI's today Rt=0.55 and Lt=0.58 (previous ABI's Rt=0.54 and Lt=0.58)   Previous left leg arterial duplex shows a patent femoral to posterior tibial bypass graft with a 50-70% stenosis of the proximal vein bypass  No outpatient medications have been marked as taking for the 09/27/23  encounter (Appointment) with Prescilla Brod, Ninette Basque, MD.    Past Medical History:  Diagnosis Date   Anemia    AVM (arteriovenous malformation)    Bronchitis    Carotid artery disease (HCC) 10/31/2015   Chronic respiratory failure (HCC)    COPD (chronic obstructive pulmonary disease) (HCC)    Critical ischemia of lower extremity (HCC) 07/18/2014   H/O blood clots    Hyperkalemia    Hypertension    Hypothyroidism 09/05/2014   Morbid obesity (HCC)    PVD (peripheral vascular disease) (HCC)    Renal failure    Stroke [facility])    may 2015   Thyroid disease     Past Surgical History:   Procedure Laterality Date   BREAST CYST EXCISION     FEMORAL ENDARTERECTOMY Right    left foot surgery  02/25/2014   LEG SURGERY Left    RIGHT/LEFT HEART CATH AND CORONARY ANGIOGRAPHY N/A 02/16/2020   Procedure: RIGHT/LEFT HEART CATH AND CORONARY ANGIOGRAPHY;  Surgeon: Sammy Crisp, MD;  Location: [facility] INVASIVE CV LAB;  Service: Cardiovascular;  Laterality: N/A;    Social History Social History   Tobacco Use   Smoking status: Never   Smokeless tobacco: Never  Vaping Use   Vaping status: Never Used  Substance Use Topics   Alcohol use: No    Alcohol/week: 0.0 standard drinks of alcohol   Drug use: No    Family History Family History  Problem Relation Age of Onset   Diabetes Mother    Hypertension Mother    Hypertension Sister    Thyroid disease Sister    Breast cancer Neg Hx     No Known Allergies   REVIEW OF SYSTEMS (Negative unless checked)  Constitutional: [] Weight loss  [] Fever  [] Chills Cardiac: [] Chest pain   [] Chest pressure   [] Palpitations   [] Shortness of breath when laying flat   [] Shortness of breath with exertion. Vascular:  [x] Pain in legs with walking   [] Pain in legs at rest  [] History of DVT   [] Phlebitis   [] Swelling in legs   [] Varicose veins   [] Non-healing ulcers Pulmonary:   [] Uses home oxygen   [] Productive cough   [] Hemoptysis   [] Wheeze  [x] COPD   [] Asthma Neurologic:  [] Dizziness   [] Seizures   [] History of stroke   [] History of TIA  [] Aphasia   [] Vissual changes   [] Weakness or numbness in arm   [] Weakness or numbness in leg Musculoskeletal:   [] Joint swelling   [] Joint pain   [] Low back pain Hematologic:  [] Easy bruising  [] Easy bleeding   [] Hypercoagulable state   [] Anemic Gastrointestinal:  [] Diarrhea   [] Vomiting  [x] Gastroesophageal reflux/heartburn   [] Difficulty swallowing. Genitourinary:  [] Chronic kidney disease   [] Difficult urination  [] Frequent urination   [] Blood in urine Skin:  [] Rashes   [] Ulcers  Psychological:  [] History  of anxiety   []  History of major depression.  Physical Examination  There were no vitals filed for this visit. There is no height or weight on file to calculate BMI. Gen: WD/WN, NAD Head: [place]/AT, No temporalis wasting.  Ear/Nose/Throat: Hearing grossly intact, nares w/o erythema or drainage Eyes: PER, EOMI, sclera nonicteric.  Neck: Supple, no masses.  No bruit or JVD.  Pulmonary:  Good air movement, no audible wheezing, no use of accessory muscles.  Cardiac: RRR, normal S1, S2, no Murmurs. Vascular:  mild trophic changes, no open wounds Vessel Right Left  Radial Palpable Palpable  PT Not Palpable Not Palpable  DP Not Palpable Not  Palpable  Gastrointestinal: soft, non-distended. No guarding/no peritoneal signs.  Musculoskeletal: M/S 5/5 throughout.  No visible deformity.  Neurologic: CN 2-12 intact. Pain and light touch intact in extremities.  Symmetrical.  Speech is fluent. Motor exam as listed above. Psychiatric: Judgment intact, Mood & affect appropriate for pt's clinical situation. Dermatologic: No rashes or ulcers noted.  No changes consistent with cellulitis.   CBC Lab Results  Component Value Date   WBC 6.0 05/01/2021   HGB 12.8 05/01/2021   HCT 42.0 05/01/2021   MCV 85.9 05/01/2021   PLT 269 05/01/2021    BMET    Component Value Date/Time   NA 143 05/01/2021 0850   NA 139 07/15/2020 1112   NA 140 04/27/2014 0907   K 4.2 05/01/2021 0850   K 3.0 (L) 04/27/2014 0907   CL 106 05/01/2021 0850   CL 104 04/27/2014 0907   CO2 26 05/01/2021 0850   CO2 26 04/27/2014 0907   GLUCOSE 95 05/01/2021 0850   GLUCOSE 111 (H) 04/27/2014 0907   BUN 21 05/01/2021 0850   BUN 19 07/15/2020 1112   BUN 7 04/27/2014 0907   CREATININE 1.47 (H) 05/01/2021 0850   CREATININE 1.00 04/27/2014 0907   CALCIUM  9.0 05/01/2021 0850   CALCIUM  8.8 04/27/2014 0907   GFRNONAA 39 (L) 05/01/2021 0850   GFRNONAA >60 04/27/2014 0907   GFRNONAA 36 (L) 11/18/2013 0926   GFRAA 37 (L) 03/05/2020  1201   GFRAA >60 04/27/2014 0907   GFRAA 42 (L) 11/18/2013 0926   CrCl cannot be calculated (Patient's most recent lab result is older than the maximum 21 days allowed.).  COAG Lab Results  Component Value Date   INR 0.99 12/02/2014    Radiology No results found.   Assessment/Plan There are no diagnoses linked to this encounter.   Devon Fogo, MD  09/25/2023 3:19 PM

## 2023-09-27 ENCOUNTER — Encounter (INDEPENDENT_AMBULATORY_CARE_PROVIDER_SITE_OTHER): Payer: Self-pay | Admitting: Vascular Surgery

## 2023-09-27 ENCOUNTER — Ambulatory Visit (INDEPENDENT_AMBULATORY_CARE_PROVIDER_SITE_OTHER): Payer: 59

## 2023-09-27 ENCOUNTER — Ambulatory Visit (INDEPENDENT_AMBULATORY_CARE_PROVIDER_SITE_OTHER): Payer: 59 | Admitting: Vascular Surgery

## 2023-09-27 VITALS — BP 127/74 | HR 75 | Resp 18 | Ht 67.0 in | Wt 259.4 lb

## 2023-09-27 DIAGNOSIS — J209 Acute bronchitis, unspecified: Secondary | ICD-10-CM

## 2023-09-27 DIAGNOSIS — J440 Chronic obstructive pulmonary disease with acute lower respiratory infection: Secondary | ICD-10-CM | POA: Diagnosis not present

## 2023-09-27 DIAGNOSIS — I2510 Atherosclerotic heart disease of native coronary artery without angina pectoris: Secondary | ICD-10-CM | POA: Diagnosis not present

## 2023-09-27 DIAGNOSIS — I70213 Atherosclerosis of native arteries of extremities with intermittent claudication, bilateral legs: Secondary | ICD-10-CM

## 2023-09-27 DIAGNOSIS — E119 Type 2 diabetes mellitus without complications: Secondary | ICD-10-CM

## 2023-09-27 DIAGNOSIS — I10 Essential (primary) hypertension: Secondary | ICD-10-CM | POA: Diagnosis not present

## 2023-09-27 DIAGNOSIS — I2584 Coronary atherosclerosis due to calcified coronary lesion: Secondary | ICD-10-CM

## 2023-09-28 LAB — VAS US ABI WITH/WO TBI
Left ABI: 0.57
Right ABI: 0.53

## 2023-10-18 ENCOUNTER — Other Ambulatory Visit: Payer: Self-pay | Admitting: Family Medicine

## 2023-10-18 DIAGNOSIS — Z1231 Encounter for screening mammogram for malignant neoplasm of breast: Secondary | ICD-10-CM

## 2023-11-24 ENCOUNTER — Ambulatory Visit
Admission: RE | Admit: 2023-11-24 | Discharge: 2023-11-24 | Disposition: A | Discharge status: Home / Self Care | Source: Ambulatory Visit | Attending: Family Medicine | Admitting: Family Medicine

## 2023-11-24 DIAGNOSIS — Z1231 Encounter for screening mammogram for malignant neoplasm of breast: Secondary | ICD-10-CM | POA: Insufficient documentation

## 2023-12-03 ENCOUNTER — Encounter: Payer: Self-pay | Admitting: Podiatry

## 2023-12-03 ENCOUNTER — Ambulatory Visit (INDEPENDENT_AMBULATORY_CARE_PROVIDER_SITE_OTHER): Admitting: Podiatry

## 2023-12-03 DIAGNOSIS — M79674 Pain in right toe(s): Secondary | ICD-10-CM

## 2023-12-03 DIAGNOSIS — M79675 Pain in left toe(s): Secondary | ICD-10-CM | POA: Diagnosis not present

## 2023-12-03 DIAGNOSIS — B351 Tinea unguium: Secondary | ICD-10-CM | POA: Diagnosis not present

## 2023-12-03 DIAGNOSIS — E1151 Type 2 diabetes mellitus with diabetic peripheral angiopathy without gangrene: Secondary | ICD-10-CM | POA: Diagnosis not present

## 2023-12-07 ENCOUNTER — Ambulatory Visit (INDEPENDENT_AMBULATORY_CARE_PROVIDER_SITE_OTHER): Admitting: Podiatry

## 2023-12-07 ENCOUNTER — Encounter: Payer: Self-pay | Admitting: Podiatry

## 2023-12-07 VITALS — Ht 67.0 in | Wt 259.4 lb

## 2023-12-07 DIAGNOSIS — M722 Plantar fascial fibromatosis: Secondary | ICD-10-CM

## 2023-12-07 MED ORDER — BETAMETHASONE SOD PHOS & ACET 6 (3-3) MG/ML IJ SUSP
3.0000 mg | Freq: Once | INTRAMUSCULAR | Status: AC
  Administered 2023-12-07 (×2): 3 mg via INTRA_ARTICULAR

## 2023-12-07 NOTE — Progress Notes (Signed)
   Chief Complaint  Patient presents with   Foot Pain    Pt is here due to right foot pain, states she want an injection into the heel for pain.    Subjective: 68 y.o. female presenting today for recurrence of plantar fasciitis to the right heel.  Last seen here in the office by me 06/22/2023.  At that time cortisone injection was administered and she felt significant relief for several months.  Over the last few weeks the pain is returned  Past Medical History:  Diagnosis Date   Anemia    AVM (arteriovenous malformation)    Bronchitis    Carotid artery disease (HCC) 10/31/2015   Chronic respiratory failure (HCC)    COPD (chronic obstructive pulmonary disease) (HCC)    Critical ischemia of lower extremity (HCC) 07/18/2014   H/O blood clots    Hyperkalemia    Hypertension    Hypothyroidism 09/05/2014   Morbid obesity (HCC)    PVD (peripheral vascular disease) (HCC)    Renal failure    Stroke [facility])    may 2015   Thyroid disease      Objective: Physical Exam General: The patient is alert and oriented x3 in no acute distress.  Dermatology: Skin is warm, dry and supple bilateral lower extremities. Negative for open lesions or macerations bilateral.   Vascular: Dorsalis Pedis and Posterior Tibial pulses palpable bilateral.  Capillary fill time is immediate to all digits.  Neurological: Grossly intact via light touch  Musculoskeletal: Tenderness to palpation to the plantar aspect of the right heel along the plantar fascia. All other joints range of motion within normal limits bilateral. Strength 5/5 in all groups bilateral.  Collapse of the medial longitudinal arch also noted with weightbearing.  Radiographic exam RT foot 06/22/2023: Normal osseous mineralization. Joint spaces preserved. No fracture/dislocation/boney destruction. No other soft tissue abnormalities or radiopaque foreign bodies.  Collapse of the medial longitudinal arch of the foot noted on lateral view consistent with  pes planus deformity  Assessment: 1. Plantar fasciitis right 2.  Pes planovalgus right  Plan of Care:  -Patient evaluated. Xrays reviewed.   -Injection of 0.5cc Celestone  soluspan injected into the right plantar fascia  -Stressed the importance of wearing good supportive shoes that provide support and cushion even around the house.  Advised against going barefoot -No prescriptions provided.  Patient on anticoagulant.  OTC Tylenol  as needed -Return to clinic next scheduled appointment for routine footcare with Dr. May Thresa EMERSON Janit, DPM [facility]  Dr. Thresa EMERSON Janit, DPM    [address] [place], [place] [address]                Office [phone]  Fax [phone]

## 2023-12-07 NOTE — Progress Notes (Signed)
  Subjective:  Patient ID: YASENIA REEDY, female    DOB: 1956/02/21,  MRN: [id number]  68 y.o. female presents to clinic with  at risk foot care. Pt has h/o NIDDM with PAD and painful thick toenails that are difficult to trim. Pain interferes with ambulation. Aggravating factors include wearing enclosed shoe gear. Pain is relieved with periodic professional debridement.  Chief Complaint  Patient presents with   [facility]    R,m4 Diabetic foot Care/DR. [place] [facility] at [facility] last visit July 2025   New problem(s): None   PCP is [facility].  No Known Allergies  Review of Systems: Negative except as noted in the HPI.   Objective:  MALAIYAH ACHORN is a pleasant 68 y.o. female obese in NAD. AAO x 3.  Vascular Examination: CFT <3 seconds b/l LE. Faintly palpable DP pulses b/l. Nonpalpable PT pulses b/l. Pedal hair absent b/l. Skin temperature gradient WNL b/l. No pain with calf compression b/l. No edema b/l LE. No cyanosis or clubbing noted b/l LE.  Neurological Examination: Protective sensation intact 5/5 intact bilaterally with 10g monofilament b/l. Vibratory sensation diminished b/l.  Dermatological Examination: Pedal skin with normal turgor, texture and tone b/l. Toenails 1-5 b/l thick, discolored, elongated with subungual debris and pain on dorsal palpation. No corns, calluses nor porokeratotic lesions noted.  Musculoskeletal Examination: Muscle strength 5/5 to b/l LE. No pain, crepitus or joint limitation noted with ROM bilateral LE. No gross bony deformities bilaterally. Utilizes rollator for ambulation assistance.  Radiographs: None  Assessment:   1. Pain due to onychomycosis of toenails of both feet   2. Type II diabetes mellitus with peripheral circulatory disorder [facility])    Plan:  Consent given for treatment. Patient examined. All patient's and/or POA's questions/concerns addressed on today's visit. Toenails 1-5 debrided in length and  girth without incident. Continue foot and shoe inspections daily. Monitor blood glucose per PCP/Endocrinologist's recommendations. Continue soft, supportive shoe gear daily. Report any pedal injuries to medical professional. Call office if there are any questions/concerns. -Patient/POA to call should there be question/concern in the interim.  Return in about 3 months (around 03/04/2024).  Delon LITTIE Merlin, DPM      [facility] LOCATION: [address], [place] [address]                   Office [phone]   [facility] LOCATION: [address] [place], [place] [address] Office [phone]

## 2024-03-06 ENCOUNTER — Ambulatory Visit (INDEPENDENT_AMBULATORY_CARE_PROVIDER_SITE_OTHER): Admitting: Podiatry

## 2024-03-06 ENCOUNTER — Encounter: Payer: Self-pay | Admitting: Podiatry

## 2024-03-06 DIAGNOSIS — E1151 Type 2 diabetes mellitus with diabetic peripheral angiopathy without gangrene: Secondary | ICD-10-CM

## 2024-03-06 DIAGNOSIS — M79675 Pain in left toe(s): Secondary | ICD-10-CM

## 2024-03-06 DIAGNOSIS — B351 Tinea unguium: Secondary | ICD-10-CM

## 2024-03-06 DIAGNOSIS — M79674 Pain in right toe(s): Secondary | ICD-10-CM

## 2024-03-13 NOTE — Progress Notes (Signed)
  Subjective:  Patient ID: Kimberly Mccarthy, female    DOB: January 26, 1956,  MRN: [id number]  Kimberly Mccarthy [place] presents to clinic today for at risk foot care. Pt has h/o NIDDM with PAD and painful elongated mycotic toenails 1-5 bilaterally which are tender when wearing enclosed shoe gear. Pain is relieved with periodic professional debridement.  Chief Complaint  Patient presents with   Toe Pain    [facility] is her PCP. Last visit was in  Oct. She is unsure of her A1c   New problem(s): None.   PCP is [facility].  No Known Allergies  Review of Systems: Negative except as noted in the HPI.  Objective: No changes noted in today's physical examination. There were no vitals filed for this visit. Kimberly Mccarthy is a pleasant 68 y.o. female obese in NAD. AAO x 3.  Vascular Examination: CFT <3 seconds b/l LE. Faintly palpable DP pulses b/l. Nonpalpable PT pulses b/l. Pedal hair absent b/l. Skin temperature gradient WNL b/l. No pain with calf compression b/l. No edema b/l LE. No cyanosis or clubbing noted b/l LE.  Neurological Examination: Protective sensation intact 5/5 intact bilaterally with 10g monofilament b/l. Vibratory sensation diminished b/l.  Dermatological Examination: Pedal skin with normal turgor, texture and tone b/l. Toenails 1-5 b/l thick, discolored, elongated with subungual debris and pain on dorsal palpation. No corns, calluses nor porokeratotic lesions noted.  Musculoskeletal Examination: Muscle strength 5/5 to b/l LE. No pain, crepitus or joint limitation noted with ROM bilateral LE. No gross bony deformities bilaterally. Utilizes rollator for ambulation assistance.  Radiographs: None  Assessment/Plan: 1. Pain due to onychomycosis of toenails of both feet   2. Type II diabetes mellitus with peripheral circulatory disorder [facility])   Patient was evaluated and treated. All patient's and/or POA's questions/concerns addressed on today's visit.  Mycotic toenails 1-5 b/l debrided in length and girth without incident.  Continue daily foot inspections and monitor blood glucose per PCP/Endocrinologist's recommendations.Continue soft, supportive shoe gear daily. Report any pedal injuries to medical professional. Call office if there are any quesitons/concerns. -Patient/POA to call should there be question/concern in the interim.   Return in about 3 months (around 06/06/2024).  Kimberly Mccarthy, DPM       LOCATION: [address] [place] [address]                   Office [phone]   [facility] LOCATION: [address] [place], [place] [address] Office [phone]

## 2024-03-27 ENCOUNTER — Ambulatory Visit (INDEPENDENT_AMBULATORY_CARE_PROVIDER_SITE_OTHER): Admitting: Vascular Surgery

## 2024-03-27 ENCOUNTER — Ambulatory Visit (INDEPENDENT_AMBULATORY_CARE_PROVIDER_SITE_OTHER)

## 2024-03-27 VITALS — Resp 17 | Ht 66.0 in | Wt 259.6 lb

## 2024-03-27 DIAGNOSIS — I70213 Atherosclerosis of native arteries of extremities with intermittent claudication, bilateral legs: Secondary | ICD-10-CM

## 2024-03-27 DIAGNOSIS — J449 Chronic obstructive pulmonary disease, unspecified: Secondary | ICD-10-CM | POA: Diagnosis not present

## 2024-03-27 DIAGNOSIS — I2584 Coronary atherosclerosis due to calcified coronary lesion: Secondary | ICD-10-CM | POA: Diagnosis not present

## 2024-03-27 DIAGNOSIS — I10 Essential (primary) hypertension: Secondary | ICD-10-CM | POA: Diagnosis not present

## 2024-03-27 DIAGNOSIS — I2510 Atherosclerotic heart disease of native coronary artery without angina pectoris: Secondary | ICD-10-CM | POA: Diagnosis not present

## 2024-03-27 LAB — VAS US ABI WITH/WO TBI
Left ABI: 0.57
Right ABI: 0.52

## 2024-03-27 NOTE — Progress Notes (Unsigned)
 MRN : [id number]  Kimberly Mccarthy is a 68 y.o. ([birth year]) female who presents with chief complaint of check circulation.  History of Present Illness:   The patient returns to the office for evaluation of painful lower extremities and diminished pulses.    LEFT LEG: LLE fem-PTA bypass with vein in 2015 with DCB angioplasty of the proximal anastomosis 2018. Regarding the LLE, patient has a history of abnormal duplex findings suggestive of stenosis of proximal anastomis, but she has multiple angiograms (most recent 02/2021) showing this is just a size mismatch. No intervention indicated but merits close follow up. Flow through graft today 33/10 cm/s.  RIGHT LEG: s/p RCFA [facility] angioplasty 03/06/21. ABI increased from 0.3 to 0.5 following intervention in Fall 2022; imaging today shows some continued CFA turbulence, but given patient's toe pressure is 54 mmHg and asymptomatic status, there is no further intervention indicated at this time.    Patient notes the pain is always associated with activity and is very consistent day today. Typically, the pain occurs at short distances, progress is as activity continues to the point that the patient must stop walking. Resting including standing still for several minutes allows the patient to walk a similar distance before being forced to stop again. The patient denies any recent abrupt changes in claudication symptoms.   The only interval change she is describing is a sharp heel pain on the right this is new and is intermittent.  Sometimes it occurs at night but when questioned about whether this could be consistent with rest pain she was quite adamant that it is only happening intermittently and not very frequently.   The patient denies rest pain or dangling of an extremity off the side of the bed during the night for relief. No open wounds or sores at this time.   Interventions to  date:  - 12/21/13: Left CFA and profunda endarterectomy with bovine patch angioplasty for CLI (tissue loss)  - 01/17/14: Left fem-PTA bypass with reversed GSV, CFA patch angioplasty/profundaplasty, balloon thrombectomy of the CFA, EIA, CIA, PFA for CLI (rest pain)  - 10/16/14: Right femoral endarterectomy and patch angioplasty, R iliac artery fogarty thrombectomy, left CIA and CFA bypass angioplasties for RLE CLI  - 07/25/15: Balloon angioplasty of proximal anastomosis stenosis, suction thrombectomy  - 05/07/16: DCB angioplasty of LLE bypass proximal anastomosis - 03/06/21: BLE diagnostic arteriogram finding patent LLE bypass, DCB angioplasty of RCFA    No history of back problems or DJD of the lumbar sacral spine.    The patient's blood pressure has been stable and relatively well controlled. The patient denies amaurosis fugax or recent TIA symptoms. There are no recent neurological changes noted. The patient denies history of DVT, PE or superficial thrombophlebitis. The patient denies recent episodes of angina or shortness of breath.     ABI's today Rt=0.52 and Lt=0.57 (previous ABI's Rt=0.53 and Lt=0.57).  No significant change compared to previous study.   Previous left leg arterial duplex shows a patent femoral to posterior tibial bypass graft with a 50-70% stenosis of the proximal vein bypass  No outpatient medications  have been marked as taking for the 03/27/24 encounter (Appointment) with Jama, Cordella MATSU, MD.    Past Medical History:  Diagnosis Date   Anemia    AVM (arteriovenous malformation)    Bronchitis    Carotid artery disease 10/31/2015   Chronic respiratory failure (HCC)    COPD (chronic obstructive pulmonary disease) (HCC)    Critical ischemia of lower extremity (HCC) 07/18/2014   H/O blood clots    Hyperkalemia    Hypertension    Hypothyroidism 09/05/2014   Morbid obesity (HCC)    PVD (peripheral vascular disease)    Renal failure    Stroke [facility])    may 2015    Thyroid disease     Past Surgical History:  Procedure Laterality Date   FEMORAL ENDARTERECTOMY Right    left foot surgery  02/25/2014   LEG SURGERY Left    RIGHT/LEFT HEART CATH AND CORONARY ANGIOGRAPHY N/A 02/16/2020   Procedure: RIGHT/LEFT HEART CATH AND CORONARY ANGIOGRAPHY;  Surgeon: Mady Bruckner, MD;  Location: [facility] INVASIVE CV LAB;  Service: Cardiovascular;  Laterality: N/A;    Social History Social History   Tobacco Use   Smoking status: Never   Smokeless tobacco: Never  Vaping Use   Vaping status: Never Used  Substance Use Topics   Alcohol use: No    Alcohol/week: 0.0 standard drinks of alcohol   Drug use: No    Family History Family History  Problem Relation Age of Onset   Diabetes Mother    Hypertension Mother    Hypertension Sister    Thyroid disease Sister    Breast cancer Neg Hx     No Known Allergies   REVIEW OF SYSTEMS (Negative unless checked)  Constitutional: [] Weight loss  [] Fever  [] Chills Cardiac: [] Chest pain   [] Chest pressure   [] Palpitations   [] Shortness of breath when laying flat   [] Shortness of breath with exertion. Vascular:  [x] Pain in legs with walking   [] Pain in legs at rest  [] History of DVT   [] Phlebitis   [] Swelling in legs   [] Varicose veins   [] Non-healing ulcers Pulmonary:   [] Uses home oxygen   [] Productive cough   [] Hemoptysis   [] Wheeze  [] COPD   [] Asthma Neurologic:  [] Dizziness   [] Seizures   [] History of stroke   [] History of TIA  [] Aphasia   [] Vissual changes   [] Weakness or numbness in arm   [] Weakness or numbness in leg Musculoskeletal:   [] Joint swelling   [] Joint pain   [] Low back pain Hematologic:  [] Easy bruising  [] Easy bleeding   [] Hypercoagulable state   [] Anemic Gastrointestinal:  [] Diarrhea   [] Vomiting  [] Gastroesophageal reflux/heartburn   [] Difficulty swallowing. Genitourinary:  [] Chronic kidney disease   [] Difficult urination  [] Frequent urination   [] Blood in urine Skin:  [] Rashes   [] Ulcers   Psychological:  [] History of anxiety   []  History of major depression.  Physical Examination  There were no vitals filed for this visit. There is no height or weight on file to calculate BMI. Gen: WD/WN, NAD Head: Cut Off/AT, No temporalis wasting.  Ear/Nose/Throat: Hearing grossly intact, nares w/o erythema or drainage Eyes: PER, EOMI, sclera nonicteric.  Neck: Supple, no masses.  No bruit or JVD.  Pulmonary:  Good air movement, no audible wheezing, no use of accessory muscles.  Cardiac: RRR, normal S1, S2, no Murmurs. Vascular:  mild trophic changes, no open wounds Vessel Right Left  Radial Palpable Palpable  PT Not Palpable Not Palpable  DP Not Palpable Not Palpable  Gastrointestinal: soft, non-distended. No guarding/no peritoneal signs.  Musculoskeletal: M/S 5/5 throughout.  No visible deformity.  Neurologic: CN 2-12 intact. Pain and light touch intact in extremities.  Symmetrical.  Speech is fluent. Motor exam as listed above. Psychiatric: Judgment intact, Mood & affect appropriate for pt's clinical situation. Dermatologic: No rashes or ulcers noted.  No changes consistent with cellulitis.   CBC Lab Results  Component Value Date   WBC 6.0 05/01/2021   HGB 12.8 05/01/2021   HCT 42.0 05/01/2021   MCV 85.9 05/01/2021   PLT 269 05/01/2021    BMET    Component Value Date/Time   NA 143 05/01/2021 0850   NA 139 07/15/2020 1112   NA 140 04/27/2014 0907   K 4.2 05/01/2021 0850   K 3.0 (L) 04/27/2014 0907   CL 106 05/01/2021 0850   CL 104 04/27/2014 0907   CO2 26 05/01/2021 0850   CO2 26 04/27/2014 0907   GLUCOSE 95 05/01/2021 0850   GLUCOSE 111 (H) 04/27/2014 0907   BUN 21 05/01/2021 0850   BUN 19 07/15/2020 1112   BUN 7 04/27/2014 0907   CREATININE 1.47 (H) 05/01/2021 0850   CREATININE 1.00 04/27/2014 0907   CALCIUM  9.0 05/01/2021 0850   CALCIUM  8.8 04/27/2014 0907   GFRNONAA 39 (L) 05/01/2021 0850   GFRNONAA >60 04/27/2014 0907   GFRNONAA 36 (L) 11/18/2013 0926    GFRAA 37 (L) 03/05/2020 1201   GFRAA >60 04/27/2014 0907   GFRAA 42 (L) 11/18/2013 0926   CrCl cannot be calculated (Patient's most recent lab result is older than the maximum 21 days allowed.).  COAG Lab Results  Component Value Date   INR 0.99 12/02/2014    Radiology No results found.   Assessment/Plan 1. Atherosclerosis of native artery of both lower extremities with intermittent claudication (Primary)  Recommend:  The patient has evidence of atherosclerosis of the lower extremities with claudication.  The patient does not voice lifestyle limiting changes at this point in time.  Noninvasive studies do not suggest clinically significant change.  No invasive studies, angiography or surgery at this time The patient should continue walking and begin a more formal exercise program.  The patient should continue antiplatelet therapy and aggressive treatment of the lipid abnormalities  No changes in the patient's medications at this time  Continued surveillance is indicated as atherosclerosis is likely to progress with time.    The patient will continue follow up with noninvasive studies as ordered.  - VAS US  ABI WITH/WO TBI; Future  2. Essential hypertension Continue antihypertensive medications as already ordered, these medications have been reviewed and there are no changes at this time.  3. Coronary artery disease due to calcified coronary lesion Continue cardiac and antihypertensive medications as already ordered and reviewed, no changes at this time.  Continue statin as ordered and reviewed, no changes at this time  Nitrates PRN for chest pain  4. Chronic obstructive pulmonary disease, unspecified COPD type (HCC) Continue pulmonary medications and aerosols as already ordered, these medications have been reviewed and there are no changes at this time.     Cordella Shawl, MD  03/27/2024 10:59 AM

## 2024-04-01 ENCOUNTER — Encounter (INDEPENDENT_AMBULATORY_CARE_PROVIDER_SITE_OTHER): Payer: Self-pay | Admitting: Vascular Surgery

## 2024-06-12 ENCOUNTER — Ambulatory Visit: Admitting: Podiatry

## 2024-07-03 ENCOUNTER — Ambulatory Visit: Admitting: Cardiovascular Disease

## 2024-09-25 ENCOUNTER — Ambulatory Visit (INDEPENDENT_AMBULATORY_CARE_PROVIDER_SITE_OTHER): Admitting: Vascular Surgery

## 2024-09-25 ENCOUNTER — Encounter (INDEPENDENT_AMBULATORY_CARE_PROVIDER_SITE_OTHER)
